# Patient Record
Sex: Male | Born: 1990 | Race: White | Hispanic: No | Marital: Single | Smoking: Current every day smoker
Health system: Southern US, Community
[De-identification: ages and names within clinical notes are randomized; demographics above are authoritative.]

## PROBLEM LIST (undated history)

## (undated) DIAGNOSIS — F17200 Nicotine dependence, unspecified, uncomplicated: Secondary | ICD-10-CM

## (undated) DIAGNOSIS — K759 Inflammatory liver disease, unspecified: Secondary | ICD-10-CM

## (undated) DIAGNOSIS — F419 Anxiety disorder, unspecified: Secondary | ICD-10-CM

## (undated) DIAGNOSIS — J45909 Unspecified asthma, uncomplicated: Secondary | ICD-10-CM

## (undated) DIAGNOSIS — F1920 Other psychoactive substance dependence, uncomplicated: Secondary | ICD-10-CM

---

## 2020-10-07 ENCOUNTER — Inpatient Hospital Stay (HOSPITAL_COMMUNITY)
Admission: AD | Admit: 2020-10-07 | Discharge: 2020-11-27 | DRG: 163 | Disposition: A | Discharge status: Home / Self Care | Payer: Self-pay | Source: Other Acute Inpatient Hospital | Attending: Internal Medicine | Admitting: Internal Medicine

## 2020-10-07 ENCOUNTER — Encounter (HOSPITAL_COMMUNITY): Payer: Self-pay | Admitting: Internal Medicine

## 2020-10-07 ENCOUNTER — Inpatient Hospital Stay
Admission: AD | Admit: 2020-10-07 | Payer: Self-pay | Source: Other Acute Inpatient Hospital | Admitting: Internal Medicine

## 2020-10-07 DIAGNOSIS — B370 Candidal stomatitis: Secondary | ICD-10-CM | POA: Diagnosis present

## 2020-10-07 DIAGNOSIS — F17210 Nicotine dependence, cigarettes, uncomplicated: Secondary | ICD-10-CM | POA: Diagnosis present

## 2020-10-07 DIAGNOSIS — M4624 Osteomyelitis of vertebra, thoracic region: Secondary | ICD-10-CM | POA: Diagnosis present

## 2020-10-07 DIAGNOSIS — J869 Pyothorax without fistula: Secondary | ICD-10-CM

## 2020-10-07 DIAGNOSIS — Z20822 Contact with and (suspected) exposure to covid-19: Secondary | ICD-10-CM | POA: Diagnosis present

## 2020-10-07 DIAGNOSIS — M4644 Discitis, unspecified, thoracic region: Secondary | ICD-10-CM | POA: Diagnosis not present

## 2020-10-07 DIAGNOSIS — J9601 Acute respiratory failure with hypoxia: Secondary | ICD-10-CM | POA: Diagnosis present

## 2020-10-07 DIAGNOSIS — Z09 Encounter for follow-up examination after completed treatment for conditions other than malignant neoplasm: Secondary | ICD-10-CM

## 2020-10-07 DIAGNOSIS — Z419 Encounter for procedure for purposes other than remedying health state, unspecified: Secondary | ICD-10-CM

## 2020-10-07 DIAGNOSIS — G061 Intraspinal abscess and granuloma: Secondary | ICD-10-CM

## 2020-10-07 DIAGNOSIS — F17200 Nicotine dependence, unspecified, uncomplicated: Secondary | ICD-10-CM | POA: Diagnosis present

## 2020-10-07 DIAGNOSIS — I10 Essential (primary) hypertension: Secondary | ICD-10-CM

## 2020-10-07 DIAGNOSIS — J9602 Acute respiratory failure with hypercapnia: Secondary | ICD-10-CM | POA: Diagnosis present

## 2020-10-07 DIAGNOSIS — F1120 Opioid dependence, uncomplicated: Secondary | ICD-10-CM | POA: Diagnosis present

## 2020-10-07 DIAGNOSIS — M4804 Spinal stenosis, thoracic region: Secondary | ICD-10-CM | POA: Diagnosis present

## 2020-10-07 DIAGNOSIS — Z79899 Other long term (current) drug therapy: Secondary | ICD-10-CM

## 2020-10-07 DIAGNOSIS — F1920 Other psychoactive substance dependence, uncomplicated: Secondary | ICD-10-CM | POA: Diagnosis present

## 2020-10-07 DIAGNOSIS — R0902 Hypoxemia: Secondary | ICD-10-CM

## 2020-10-07 DIAGNOSIS — E861 Hypovolemia: Secondary | ICD-10-CM | POA: Diagnosis present

## 2020-10-07 DIAGNOSIS — D702 Other drug-induced agranulocytosis: Secondary | ICD-10-CM | POA: Diagnosis not present

## 2020-10-07 DIAGNOSIS — Z6841 Body Mass Index (BMI) 40.0 and over, adult: Secondary | ICD-10-CM

## 2020-10-07 DIAGNOSIS — E871 Hypo-osmolality and hyponatremia: Secondary | ICD-10-CM | POA: Diagnosis present

## 2020-10-07 DIAGNOSIS — D509 Iron deficiency anemia, unspecified: Secondary | ICD-10-CM

## 2020-10-07 DIAGNOSIS — B1920 Unspecified viral hepatitis C without hepatic coma: Secondary | ICD-10-CM

## 2020-10-07 DIAGNOSIS — M62838 Other muscle spasm: Secondary | ICD-10-CM | POA: Diagnosis present

## 2020-10-07 DIAGNOSIS — J45909 Unspecified asthma, uncomplicated: Secondary | ICD-10-CM | POA: Diagnosis present

## 2020-10-07 DIAGNOSIS — E872 Acidosis: Secondary | ICD-10-CM | POA: Diagnosis present

## 2020-10-07 DIAGNOSIS — G4733 Obstructive sleep apnea (adult) (pediatric): Secondary | ICD-10-CM

## 2020-10-07 DIAGNOSIS — Z4659 Encounter for fitting and adjustment of other gastrointestinal appliance and device: Secondary | ICD-10-CM

## 2020-10-07 DIAGNOSIS — E662 Morbid (severe) obesity with alveolar hypoventilation: Secondary | ICD-10-CM | POA: Diagnosis present

## 2020-10-07 DIAGNOSIS — A4902 Methicillin resistant Staphylococcus aureus infection, unspecified site: Secondary | ICD-10-CM

## 2020-10-07 DIAGNOSIS — A419 Sepsis, unspecified organism: Secondary | ICD-10-CM | POA: Diagnosis not present

## 2020-10-07 DIAGNOSIS — B9562 Methicillin resistant Staphylococcus aureus infection as the cause of diseases classified elsewhere: Secondary | ICD-10-CM | POA: Diagnosis present

## 2020-10-07 DIAGNOSIS — F411 Generalized anxiety disorder: Secondary | ICD-10-CM | POA: Diagnosis present

## 2020-10-07 DIAGNOSIS — T39395A Adverse effect of other nonsteroidal anti-inflammatory drugs [NSAID], initial encounter: Secondary | ICD-10-CM | POA: Diagnosis not present

## 2020-10-07 HISTORY — DX: Anxiety disorder, unspecified: F41.9

## 2020-10-07 HISTORY — DX: Morbid (severe) obesity due to excess calories: E66.01

## 2020-10-07 HISTORY — DX: Other psychoactive substance dependence, uncomplicated: F19.20

## 2020-10-07 HISTORY — DX: Unspecified asthma, uncomplicated: J45.909

## 2020-10-07 HISTORY — DX: Nicotine dependence, unspecified, uncomplicated: F17.200

## 2020-10-07 LAB — CBC WITH DIFFERENTIAL/PLATELET
Abs Immature Granulocytes: 0.04 10*3/uL (ref 0.00–0.07)
Basophils Absolute: 0 10*3/uL (ref 0.0–0.1)
Basophils Relative: 0 %
Eosinophils Absolute: 0.1 10*3/uL (ref 0.0–0.5)
Eosinophils Relative: 2 %
HCT: 33.6 % — ABNORMAL LOW (ref 39.0–52.0)
Hemoglobin: 11 g/dL — ABNORMAL LOW (ref 13.0–17.0)
Immature Granulocytes: 1 %
Lymphocytes Relative: 18 %
Lymphs Abs: 1.3 10*3/uL (ref 0.7–4.0)
MCH: 30.1 pg (ref 26.0–34.0)
MCHC: 32.7 g/dL (ref 30.0–36.0)
MCV: 91.8 fL (ref 80.0–100.0)
Monocytes Absolute: 0.3 10*3/uL (ref 0.1–1.0)
Monocytes Relative: 3 %
Neutro Abs: 5.6 10*3/uL (ref 1.7–7.7)
Neutrophils Relative %: 76 %
Platelets: 358 10*3/uL (ref 150–400)
RBC: 3.66 MIL/uL — ABNORMAL LOW (ref 4.22–5.81)
RDW: 13.4 % (ref 11.5–15.5)
WBC: 7.3 10*3/uL (ref 4.0–10.5)
nRBC: 0 % (ref 0.0–0.2)

## 2020-10-07 LAB — HIV ANTIBODY (ROUTINE TESTING W REFLEX): HIV Screen 4th Generation wRfx: NONREACTIVE

## 2020-10-07 LAB — COMPREHENSIVE METABOLIC PANEL
ALT: 17 U/L (ref 0–44)
AST: 29 U/L (ref 15–41)
Albumin: 2.4 g/dL — ABNORMAL LOW (ref 3.5–5.0)
Alkaline Phosphatase: 128 U/L — ABNORMAL HIGH (ref 38–126)
Anion gap: 10 (ref 5–15)
BUN: 10 mg/dL (ref 6–20)
CO2: 25 mmol/L (ref 22–32)
Calcium: 9.1 mg/dL (ref 8.9–10.3)
Chloride: 99 mmol/L (ref 98–111)
Creatinine, Ser: 0.76 mg/dL (ref 0.61–1.24)
GFR, Estimated: >60 mL/min (ref >60)
Glucose, Bld: 103 mg/dL — ABNORMAL HIGH (ref 70–99)
Potassium: 4.2 mmol/L (ref 3.5–5.1)
Sodium: 134 mmol/L — ABNORMAL LOW (ref 135–145)
Total Bilirubin: 0.6 mg/dL (ref 0.3–1.2)
Total Protein: 8 g/dL (ref 6.5–8.1)

## 2020-10-07 LAB — C-REACTIVE PROTEIN: CRP: 16.4 mg/dL — ABNORMAL HIGH (ref <1.0)

## 2020-10-07 LAB — PROCALCITONIN: Procalcitonin: 0.47 ng/mL

## 2020-10-07 LAB — LACTIC ACID, PLASMA
Lactic Acid, Venous: 1.1 mmol/L (ref 0.5–1.9)
Lactic Acid, Venous: 1.7 mmol/L (ref 0.5–1.9)

## 2020-10-07 LAB — SEDIMENTATION RATE: Sed Rate: 112 mm/hr — ABNORMAL HIGH (ref 0–16)

## 2020-10-07 MED ORDER — ONDANSETRON HCL 4 MG PO TABS: 4.0000 mg | ORAL_TABLET | Freq: Four times a day (QID) | ORAL | Status: DC | PRN

## 2020-10-07 MED ORDER — ONDANSETRON HCL 4 MG/2ML IJ SOLN: 4.0000 mg | Freq: Four times a day (QID) | INTRAMUSCULAR | Status: DC | PRN

## 2020-10-07 MED ORDER — VANCOMYCIN HCL 10 G IV SOLR
2500.0000 mg | Freq: Once | INTRAVENOUS | Status: DC
  Filled 2020-10-07: qty 2500

## 2020-10-07 MED ORDER — CLONIDINE HCL 0.1 MG PO TABS
0.1000 mg | ORAL_TABLET | Freq: Four times a day (QID) | ORAL | Status: AC
  Administered 2020-10-07 – 2020-10-08 (×7): 0.1 mg via ORAL
  Filled 2020-10-07 (×7): qty 1

## 2020-10-07 MED ORDER — SODIUM CHLORIDE 0.9 % IV SOLN
2.0000 g | Freq: Once | INTRAVENOUS | Status: AC
  Administered 2020-10-07: 2 g via INTRAVENOUS
  Filled 2020-10-07: qty 2

## 2020-10-07 MED ORDER — CLONIDINE HCL 0.1 MG PO TABS: 0.1000 mg | ORAL_TABLET | Freq: Every day | ORAL | Status: DC

## 2020-10-07 MED ORDER — BUPRENORPHINE HCL-NALOXONE HCL 2-0.5 MG SL SUBL
1.0000 | SUBLINGUAL_TABLET | SUBLINGUAL | Status: AC | PRN
  Administered 2020-10-07 (×2): 1 via SUBLINGUAL
  Filled 2020-10-07 (×3): qty 1

## 2020-10-07 MED ORDER — DICYCLOMINE HCL 20 MG PO TABS
20.0000 mg | ORAL_TABLET | Freq: Four times a day (QID) | ORAL | Status: DC | PRN
  Filled 2020-10-07: qty 1

## 2020-10-07 MED ORDER — LACTATED RINGERS IV SOLN: INTRAVENOUS | Status: DC

## 2020-10-07 MED ORDER — IPRATROPIUM-ALBUTEROL 0.5-2.5 (3) MG/3ML IN SOLN
3.0000 mL | Freq: Four times a day (QID) | RESPIRATORY_TRACT | Status: DC
  Administered 2020-10-07: 3 mL via RESPIRATORY_TRACT
  Filled 2020-10-07: qty 3

## 2020-10-07 MED ORDER — ACETAMINOPHEN 325 MG PO TABS
650.0000 mg | ORAL_TABLET | Freq: Four times a day (QID) | ORAL | Status: DC | PRN
  Administered 2020-10-08 (×4): 650 mg via ORAL
  Filled 2020-10-07 (×4): qty 2

## 2020-10-07 MED ORDER — BUPRENORPHINE HCL-NALOXONE HCL 8-2 MG SL SUBL
1.0000 | SUBLINGUAL_TABLET | Freq: Two times a day (BID) | SUBLINGUAL | Status: DC
  Administered 2020-10-08 – 2020-10-09 (×3): 1 via SUBLINGUAL
  Filled 2020-10-07 (×3): qty 1

## 2020-10-07 MED ORDER — NICOTINE 14 MG/24HR TD PT24
14.0000 mg | MEDICATED_PATCH | Freq: Every day | TRANSDERMAL | Status: DC
  Administered 2020-10-07 – 2020-11-27 (×51): 14 mg via TRANSDERMAL
  Filled 2020-10-07 (×53): qty 1

## 2020-10-07 MED ORDER — NAPROXEN 250 MG PO TABS
500.0000 mg | ORAL_TABLET | Freq: Two times a day (BID) | ORAL | Status: DC | PRN
  Administered 2020-10-07 – 2020-10-08 (×3): 500 mg via ORAL
  Filled 2020-10-07 (×6): qty 2

## 2020-10-07 MED ORDER — ACETAMINOPHEN 650 MG RE SUPP: 650.0000 mg | Freq: Four times a day (QID) | RECTAL | Status: DC | PRN

## 2020-10-07 MED ORDER — METHOCARBAMOL 500 MG PO TABS
500.0000 mg | ORAL_TABLET | Freq: Three times a day (TID) | ORAL | Status: DC | PRN
  Administered 2020-10-07 – 2020-10-08 (×4): 500 mg via ORAL
  Filled 2020-10-07 (×5): qty 1

## 2020-10-07 MED ORDER — HYDROXYZINE HCL 25 MG PO TABS
25.0000 mg | ORAL_TABLET | Freq: Four times a day (QID) | ORAL | Status: DC | PRN
  Administered 2020-10-08: 25 mg via ORAL
  Filled 2020-10-07: qty 1

## 2020-10-07 MED ORDER — SODIUM CHLORIDE 0.9 % IV SOLN
2.0000 g | Freq: Three times a day (TID) | INTRAVENOUS | Status: DC
  Administered 2020-10-07 – 2020-10-11 (×11): 2 g via INTRAVENOUS
  Filled 2020-10-07 (×11): qty 2

## 2020-10-07 MED ORDER — LOPERAMIDE HCL 2 MG PO CAPS: 2.0000 mg | ORAL_CAPSULE | ORAL | Status: DC | PRN

## 2020-10-07 MED ORDER — ALBUTEROL SULFATE (2.5 MG/3ML) 0.083% IN NEBU
2.5000 mg | INHALATION_SOLUTION | RESPIRATORY_TRACT | Status: DC | PRN
  Administered 2020-10-08 – 2020-11-11 (×2): 2.5 mg via RESPIRATORY_TRACT
  Filled 2020-10-07 (×2): qty 3

## 2020-10-07 MED ORDER — SODIUM CHLORIDE 0.9% FLUSH
3.0000 mL | Freq: Two times a day (BID) | INTRAVENOUS | Status: DC
  Administered 2020-10-07 – 2020-11-10 (×51): 3 mL via INTRAVENOUS

## 2020-10-07 MED ORDER — CLONIDINE HCL 0.1 MG PO TABS
0.1000 mg | ORAL_TABLET | ORAL | Status: DC
  Administered 2020-10-09 (×2): 0.1 mg via ORAL
  Filled 2020-10-07 (×3): qty 1

## 2020-10-07 MED ORDER — VANCOMYCIN HCL 2000 MG/400ML IV SOLN
2000.0000 mg | Freq: Two times a day (BID) | INTRAVENOUS | Status: DC
  Filled 2020-10-07: qty 400

## 2020-10-07 NOTE — Progress Notes (Signed)
Patient admitted to 4E. VS are stable. CHG bath given. [name] A/O x4. Admitting MD notified of pt arrival.

## 2020-10-07 NOTE — Progress Notes (Signed)
Pharmacy Antibiotic Note  Derek Woods is a 30 y.o. male admitted on 10/07/2020 with sepsis.  Pharmacy has been consulted for vancomycin and cefepime dosing.  Patient normotensive, with HR in the 80s, pharmacy consulted for sepsis. WBC 9.3, Lactate 2.2, HS CRP 143.8, Procalcitonin 0.32, COVID negative. Patient noted with unknown childhood reaction to augmentin and ceclor, will monitor.  CrCl >120 with Scr of 0.76. Vancomycin calculated AUC is 496.  Plan: Vancomycin loading dose 2500mg  IV x1 Vancomycin 2000mg  IV every 12 hours.  Goal AUC 400-550 Cefepime 2g q8 hours Monitor cultures for deescalating and renal function  Height: 5\' 6"  (167.6 cm) Weight: (!) 146.7 kg (323 lb 6.4 oz) IBW/kg (Calculated) : 63.8  Temp (24hrs), Avg:98.8 F (37.1 C), Min:98.8 F (37.1 C), Max:98.8 F (37.1 C)  No results for input(s): WBC, CREATININE, LATICACIDVEN, VANCOTROUGH, VANCOPEAK, VANCORANDOM, GENTTROUGH, GENTPEAK, GENTRANDOM, TOBRATROUGH, TOBRAPEAK, TOBRARND, AMIKACINPEAK, AMIKACINTROU, AMIKACIN in the last 168 hours.  CrCl cannot be calculated (No successful lab value found.).    No Known Allergies  Antimicrobials this admission: 4/24 vancomycin >> 4/24 cefepime >>  Dose adjustments this admission: N/A  Microbiology results: 4/24 BCx: pending (collected prior to abx)  Thank you for allowing pharmacy to be a part of this patient's care.  5/24, PharmD PGY1 Pharmacy Resident 10/07/2020 11:00 AM

## 2020-10-07 NOTE — Consult Note (Signed)
[address]       [place] [id number]             [phone]      Cardiothoracic Surgery Consultation  Reason for Consult: Right Empyema Referring Physician: Dr. Jonah BlueJennifer Yates  Ileene Rubensracey Woods is an 30 y.o. male.  HPI:   The patient is a 30 year old gentleman with a history of morbid obesity, daily heroin abuse, and smoking who presents with about a 3-week history of progressive right back pain with development of worsening shortness of breath over the past week.  He said that he initially thought he had pulled a muscle and it would get better but it has not.  He denies any fever or chills.  He has had some cough but no sputum production.  He says that he tries use heroin every day if he can.  He smokes 1 pack of cigarettes per day but has been smoking less since he got short of breath.  He is unemployed.  Past Medical History:  Diagnosis Date  . Anxiety   . Asthma   . Morbid obesity with BMI of 50.0-59.9, adult (HCC)   . Polysubstance dependence including opioid type drug with complication, episodic abuse (HCC)     History reviewed. No pertinent surgical history.  History reviewed. No pertinent family history.  Social History:  reports that he has been smoking cigarettes. He has a 11.00 pack-year smoking history. He uses smokeless tobacco. He reports current alcohol use. He reports current drug use. Drugs: Heroin and Marijuana.  Allergies:  Allergies  Allergen Reactions  . Augmentin [Amoxicillin-Pot Clavulanate] Other (See Comments)    Pt does not recall, childhood  . Ceclor [Cefaclor] Other (See Comments)    Pt does not recall, childhood  . Sulfa Antibiotics Hives    Medications:  I have reviewed the patient's current medications. Prior to Admission:  Medications Prior to Admission  Medication Sig Dispense Refill Last Dose  . acetaminophen (TYLENOL) 500 MG tablet Take 500 mg by mouth as needed (pain).   10/06/2020 at Unknown time  . ibuprofen  (ADVIL) 200 MG tablet Take 600-800 mg by mouth as needed (pain).   10/06/2020 at Unknown time  . Melatonin 10 MG TABS Take 10 mg by mouth at bedtime as needed (sleep).   unknown   Scheduled: . [START ON 10/08/2020] buprenorphine-naloxone  1 tablet Sublingual BID  . cloNIDine  0.1 mg Oral QID   Followed by  . [START ON 10/09/2020] cloNIDine  0.1 mg Oral BH-qamhs   Followed by  . [START ON 10/11/2020] cloNIDine  0.1 mg Oral QAC breakfast  . ipratropium-albuterol  3 mL Nebulization Q6H  . nicotine  14 mg Transdermal Daily  . sodium chloride flush  3 mL Intravenous Q12H   Continuous: . ceFEPime (MAXIPIME) IV 2 g (10/07/20 1321)  . ceFEPime (MAXIPIME) IV    . lactated ringers 100 mL/hr at 10/07/20 1310  . vancomycin    . vancomycin     BJY:[id number]:acetaminophen **OR** acetaminophen, albuterol, buprenorphine-naloxone, dicyclomine, hydrOXYzine, loperamide, methocarbamol, naproxen, ondansetron **OR** ondansetron (ZOFRAN) IV Anti-infectives (From admission, onward)   Start     Dose/Rate Route Frequency Ordered Stop   10/07/20 2200  vancomycin (VANCOREADY) IVPB 2000 mg/400 mL        2,000 mg 200 mL/hr over 120 Minutes Intravenous Every 12 hours 10/07/20 1251     10/07/20 2000  ceFEPIme (MAXIPIME) 2 g in sodium chloride 0.9 % 100 mL IVPB  2 g 200 mL/hr over 30 Minutes Intravenous Every 8 hours 10/07/20 1251     10/07/20 1145  ceFEPIme (MAXIPIME) 2 g in sodium chloride 0.9 % 100 mL IVPB        2 g 200 mL/hr over 30 Minutes Intravenous  Once 10/07/20 1048     10/07/20 1100  vancomycin (VANCOCIN) 2,500 mg in sodium chloride 0.9 % 500 mL IVPB        2,500 mg 250 mL/hr over 120 Minutes Intravenous  Once 10/07/20 1048        Results for orders placed or performed during the hospital encounter of 10/07/20 (from the past 48 hour(s))  HIV Antibody (routine testing w rflx)     Status: None   Collection Time: 10/07/20 11:01 AM  Result Value Ref Range   HIV Screen 4th Generation wRfx Non Reactive Non  Reactive    Comment: Performed at [facility], [address], [place], [place] [address]  Lactic acid, plasma     Status: None   Collection Time: 10/07/20 11:01 AM  Result Value Ref Range   Lactic Acid, Venous 1.1 0.5 - 1.9 mmol/L    Comment: Performed at [facility], [address], [place], [place] [address]  CBC WITH DIFFERENTIAL     Status: Abnormal   Collection Time: 10/07/20 11:01 AM  Result Value Ref Range   WBC 7.3 4.0 - 10.5 K/uL   RBC 3.66 (L) 4.22 - 5.81 MIL/uL   Hemoglobin 11.0 (L) 13.0 - 17.0 g/dL   HCT 69.6 (L) 29.5 - 28.4 %   MCV 91.8 80.0 - 100.0 fL   MCH 30.1 26.0 - 34.0 pg   MCHC 32.7 30.0 - 36.0 g/dL   RDW 13.2 44.0 - 10.2 %   Platelets 358 150 - 400 K/uL   nRBC 0.0 0.0 - 0.2 %   Neutrophils Relative % 76 %   Neutro Abs 5.6 1.7 - 7.7 K/uL   Lymphocytes Relative 18 %   Lymphs Abs 1.3 0.7 - 4.0 K/uL   Monocytes Relative 3 %   Monocytes Absolute 0.3 0.1 - 1.0 K/uL   Eosinophils Relative 2 %   Eosinophils Absolute 0.1 0.0 - 0.5 K/uL   Basophils Relative 0 %   Basophils Absolute 0.0 0.0 - 0.1 K/uL   Immature Granulocytes 1 %   Abs Immature Granulocytes 0.04 0.00 - 0.07 K/uL    Comment: Performed at [facility], [address] [place], [place] [address]  Comprehensive metabolic panel     Status: Abnormal   Collection Time: 10/07/20 11:01 AM  Result Value Ref Range   Sodium 134 (L) 135 - 145 mmol/L   Potassium 4.2 3.5 - 5.1 mmol/L   Chloride 99 98 - 111 mmol/L   CO2 25 22 - 32 mmol/L   Glucose, Bld 103 (H) 70 - 99 mg/dL    Comment: Glucose reference range applies only to samples taken after fasting for at least 8 hours.   BUN 10 6 - 20 mg/dL   Creatinine, Ser 6.44 0.61 - 1.24 mg/dL   Calcium 9.1 8.9 - 03.4 mg/dL   Total Protein 8.0 6.5 - 8.1 g/dL   Albumin 2.4 (L) 3.5 - 5.0 g/dL   AST 29 15 - 41 U/L   ALT 17 0 - 44 U/L   Alkaline Phosphatase 128 (H) 38 - 126 U/L   Total Bilirubin 0.6 0.3 - 1.2 mg/dL   GFR, Estimated >74 >25 mL/min  Comment: (NOTE) Calculated using the CKD-EPI Creatinine Equation (2021)    Anion gap 10 5 - 15    Comment: Performed at [facility], [address], [place], [place] [address]  Sedimentation rate     Status: Abnormal   Collection Time: 10/07/20 11:01 AM  Result Value Ref Range   Sed Rate 112 (H) 0 - 16 mm/hr    Comment: Performed at [facility], [address], [place], [place] [address]  C-reactive protein     Status: Abnormal   Collection Time: 10/07/20 11:01 AM  Result Value Ref Range   CRP 16.4 (H) <1.0 mg/dL    Comment: Performed at [facility], [address] [place], [place] [address]    No results found.  Review of Systems  Constitutional: Positive for fatigue. Negative for appetite change, chills, diaphoresis, fever and unexpected weight change.  HENT: Negative.   Eyes: Negative.   Respiratory: Positive for cough, chest tightness and shortness of breath.   Cardiovascular: Positive for chest pain. Negative for leg swelling.  Gastrointestinal: Negative.   Endocrine: Negative.   Genitourinary: Negative.   Musculoskeletal: Positive for back pain.  Skin: Negative.   Allergic/Immunologic: Negative.   Neurological: Negative.   Hematological: Negative.   Psychiatric/Behavioral: Negative.    Blood pressure (!) 131/95, pulse 92, temperature 98.4 F (36.9 C), temperature source Oral, resp. rate (!) 22, height 5\' 6"  (1.676 m), weight (!) 146.7 kg, SpO2 97 %. Physical Exam Constitutional:      General: He is not in acute distress.    Appearance: He is obese.  HENT:     Head: Normocephalic and atraumatic.  Eyes:     Extraocular Movements: Extraocular movements intact.     Pupils: Pupils are equal, round, and reactive to light.  Neck:     Vascular: No carotid bruit.  Cardiovascular:     Rate and Rhythm: Normal rate and regular rhythm.     Pulses: Normal pulses.     Heart sounds: Normal heart sounds. No murmur heard.   Pulmonary:     Effort:  Pulmonary effort is normal.     Breath sounds: No wheezing or rhonchi.     Comments: Decreased breath sounds over the right hemithorax Abdominal:     Tenderness: There is no abdominal tenderness.  Musculoskeletal:        General: No swelling.     Cervical back: Normal range of motion and neck supple.  Lymphadenopathy:     Cervical: No cervical adenopathy.  Skin:    General: Skin is warm and dry.     Comments: Multiple old scars on his trunk from prior cysts  Neurological:     General: No focal deficit present.     Mental Status: He is alert and oriented to person, place, and time.  Psychiatric:        Mood and Affect: Mood normal.        Behavior: Behavior normal.    CT scan of the chest and abdomen reviewed from [facility] on PACS  Assessment/Plan:  This 30 year old gentleman has a large right empyema with a 3-week history of progressive right-sided back pain and shortness of breath there is a thick rim around the collection.  Given his obesity and the appearance of the empyema I think this would be best drained surgically with either VATS if possible or limited thoracotomy. Percutaneous catheter drainage is likely to be incomplete and difficult due to his obesity.  I  discussed the operative procedure of right VATS and possible thoracotomy with him including alternatives, benefits, and risks including but not limited to bleeding, blood transfusion, persistent pleural space infection, prolonged air leak from the lung, incomplete expansion of the lung, and respiratory failure.  He understands and agrees to proceed.  I would not be able to get this on the schedule until Tuesday afternoon.  He currently shows no signs of sepsis or toxicity so waiting until Tuesday afternoon should be fine.  [name] 10/07/2020, 1:41 PM

## 2020-10-07 NOTE — H&P (Signed)
History and Physical    Derek Woods HKV:[id number] DOB: [birth year] DOA: 10/07/2020  PCP: No primary care provider on file. Consultants:  None Patient coming from:  Home - lives with mother; Derek Woods: Mother, [phone]  Chief Complaint: R flank and back pain  HPI: Derek Woods is a 30 y.o. male with medical history significant of asthma; anxiety; morbid obesity; and polysubstance abuse presenting with R flank pain and back pain.  He noticed onset of back pain, breathing difficulty worse than his usual asthma.  It is hard to stand and move, hard to do anything.  Symptoms have been present for a couple of days with SOB, but he has had back pain for about 3 weeks.  No fevers.  No sick contacts.  Last heroin use was yesterday AM.  He has been on Subutex for a few months and did well until he broke his foot and couldn't make it to his drug tests.  He would like to start back on Suboxone.    He reports that he has a skin condition causing diffuse pustules with scarring.  He can't remember the name of it.  He previously had PNA and pleural effusion in January; per [facility] ER record, it was aspiration PNA after heroin OD.    ED Course:  RH to [facility] transfer, per Dr. Cyd Silence:  Rght flank and back pain.  Pulse 108, BP 148/84, RR 24, saturating 93% on 2 L. CT chest revealed a substantial right-sided effusion measuring 15.2 x 7 cm with the appearance of an empyema with adjacent compressive atelectasis/infiltrate with evidence of mediastinal lymphadenopathy.  UDS + for marijuana alone (although patient admits to Heroin use within the past 24 hrs).  Lactic acid 2.2.  HS CRP 143.8.  Procalcitonin 0.32.  COVID-19 PCR negative. Started on intravenous antibiotic therapy.  Due to presence of an empyema is felt that either interventional radiology or cardiothoracic surgery was necessary.  Images can be reviewed with radiology on arrival to determine best approach.  Review of Systems: As per HPI; otherwise review of systems  reviewed and negative.   Ambulatory Status:  Ambulates without assistance  COVID Vaccine Status:  None  Past Medical History:  Diagnosis Date  . Anxiety   . Asthma   . Morbid obesity with BMI of 50.0-59.9, adult ([place])   . Polysubstance dependence including opioid type drug with complication, episodic abuse ([place])   . Tobacco dependence     History reviewed. No pertinent surgical history.  Social History   Socioeconomic History  . Marital status: Not on file    Spouse name: Not on file  . Number of children: Not on file  . Years of education: Not on file  . Highest education level: Not on file  Occupational History  . Occupation: unemployed  Tobacco Use  . Smoking status: Current Every Day Smoker    Packs/day: 1.00    Years: 11.00    Pack years: 11.00    Types: Cigarettes  . Smokeless tobacco: Current User  . Tobacco comment: unable to smoke; occasional use of oral tobacco  Substance and Sexual Activity  . Alcohol use: Yes    Comment: "hardly any"  . Drug use: Yes    Types: Heroin, Marijuana    Comment: occasional marijuana, daily heroin  . Sexual activity: Not on file  Other Topics Concern  . Not on file  Social History Narrative  . Not on file   Social Determinants of Health   Financial Resource Strain: Not on  file  Food Insecurity: Not on file  Transportation Needs: Not on file  Physical Activity: Not on file  Stress: Not on file  Social Connections: Not on file  Intimate Partner Violence: Not on file    Allergies  Allergen Reactions  . Augmentin [Amoxicillin-Pot Clavulanate] Other (See Comments)    Pt does not recall, childhood  . Ceclor [Cefaclor] Other (See Comments)    Pt does not recall, childhood  . Sulfa Antibiotics Hives    History reviewed. No pertinent family history.  Prior to Admission medications   Not on File    Physical Exam: Vitals:   10/07/20 1006 10/07/20 1143  BP: (!) 158/99 (!) 131/95  Pulse: 97 92  Resp: (!) 21 (!) 22   Temp: 98.8 F (37.1 C) 98.4 F (36.9 C)  TempSrc: Oral Oral  SpO2: 97% 97%  Weight: (!) 146.7 kg   Height: 5' 6" (1.676 m)      . General:  Appears obese, mildly disheveled, and SOB despite [place] O2 . Eyes:   EOMI, normal lids, iris . ENT:  grossly normal hearing, lips & tongue, mmm . Neck:  no LAD, masses or thyromegaly . Cardiovascular:  RRR, no m/r/g. No LE edema.  Marland Kitchen Respiratory:   Decreased air movement on the right with scattered rhonchi.  Mildly increased respiratory effort. . Abdomen:  soft, NT, ND . Skin:  no rash or induration seen on limited exam; scattered skin lesions on hands and distal forearms c/w injection wounds . Musculoskeletal:  grossly normal tone BUE/BLE, good ROM, no bony abnormality . Psychiatric:  blunted mood and affect, speech fluent and appropriate, AOx3 . Neurologic:  CN 2-12 grossly intact, moves all extremities in coordinated fashion    Radiological Exams on Admission: Independently reviewed - see discussion in A/P where applicable  No results found.   CT -  1. Large loculated fluid collection in R hemithorax 15.2/7 cm with a thick peripheral rim of enhancement - concerning for empyema vs. Intrapulmonary abscess 2. Stable 2 cm soft tissue lesion in anterior mediastinum - thymoma or lymph node? F/u in 3 months. 3. Prominent mediastinal, R hilar and axillary lymph nodes - likely reactive 4. Probable inflamed sebaceous cyst 0.3 cm in midline back 5. Hepatosplenomegaly 6. Sigmoid colonic diverticulosis   EKG: none   Labs on Admission: I have personally reviewed the available labs and imaging studies at the time of the admission.  Pertinent labs:   At [facility]: WBC 9.3 Hgb 11.1 Platelets 451 Glucose 139 UDS + THC Lactate 2.2 CRP 143.8 Procalcitonin 0.32 COVID negative  At [facility]: glucose 103 Albumin 2.4 CRP 16.4 ESR 112 Lactate 1.1 Procalcitonin 0.47 WBC 7.3 Hgb 11.0 HIV negative   Assessment/Plan Principal Problem:   Empyema lung  (HCC) Active Problems:   Polysubstance dependence including opioid type drug with complication, episodic abuse (HCC)   Morbid obesity with BMI of 50.0-59.9, adult (HCC)   Tobacco dependence    Empyema -Patient with h/o IVDA and ?aspiration PNA in January with resultant pleural effusion presenting with SOB, back pain -Imaging at [facility] shows significant apparent empyema -He was transferred to [facility] for IR vs. CVTS evaluation -I discussed the patient with Dr. Vernard Gambles from IR; while willing to drain, he recommended discussion with CVTS first -Blood cultures are pending -CVTS consulted, Dr. Cyndia Bent will perform VATS vs. Limited thoracotomy on Tuesday -?endocarditis - unusual to have empyema without other infection; will order echo, may need TEE -Nu current evidence of sepsis, although he appeared to  have had sepsis physiology at [facility] and so sepsis order set was used -Will cover with Cefepime empirically for now -Will admit to telemetry -Will add Duonebs and prn Albuterol  Polysubstance abuse -Long-standing heroin dependence -He reports a desire to quit and recognizes that ongoing use is life-threatening -Will initiate suboxone therapy now; while this may precipitate early withdrawal, hopefully he can reach a level of withdrawal symptom relief that will enable him to remain hospitalized and complete treatment -Will use the Suboxone therapy order set -If patient experiences precipitated withdrawal, will give small, frequent doses of buprenorphine (2 mg q 1-2h with adjunctive Clonidine 0.1 mg q8h, Zofran, and NSAIDs) until the precipitated withdrawal is overcome -If patient requires pain management in addition to home Suboxone therapy, use high-affinity full agonist opioids such as hydromorphone IV - not ordered at this time -Will monitor on COWS protocol (5-12 mild symptoms; 13-24 moderate symptoms; 25-36 moderately severe symptoms; >36 severe symptoms) -TOC team consult for substance abuse  education/support -prn orders from the Clonidine withdrawal order set were also ordered.  Tobacco dependence -Encourage cessation.   -This was discussed with the patient and should be reviewed on an ongoing basis.   -Patch ordered at patient request.  Obesity -Body mass index is 52.2 kg/m..  -Weight loss should be encouraged -Outpatient PCP/bariatric medicine/bariatric surgery f/u encouraged     Note: This patient has been tested and is negative for the novel coronavirus COVID-19. The patient has been fully vaccinated against COVID-19.   Level of care: Telemetry Medical DVT prophylaxis: SCDs Code Status:  Full - confirmed with patient Family Communication: None present Disposition Plan:  The patient is from: home  Anticipated d/c is to: home without [facility] services  Anticipated d/c date will depend on clinical response to treatment, but possibly as early as tomorrow if she has excellent response to treatment  Patient is currently: acutely ill Consults called: CVTS; IR by telephone only; Spiritual care; TOC team Admission status:  Admit - It is my clinical opinion that admission to INPATIENT is reasonable and necessary because of the expectation that this patient will require hospital care that crosses at least 2 midnights to treat this condition based on the medical complexity of the problems presented.  Given the aforementioned information, the predictability of an adverse outcome is felt to be significant.    Karmen Bongo MD [facility]   How to contact the [facility] Attending or Consulting provider [place] or covering provider during after hours [place], for this patient?  1. Check the care team in [facility] and look for a) attending/consulting TR[facility] provider listed and b) the [facility] team listed 2. Log into [email or web address] and use [place]'s universal password to access. If you do not have the password, please contact the hospital operator. 3. Locate the [facility] provider you are looking  for under [facility] and page to a number that you can be directly reached. 4. If you still have difficulty reaching the provider, please page the [facility] (Director on Call) for the Hospitalists listed on amion for assistance.   10/07/2020, 2:32 PM

## 2020-10-07 NOTE — Plan of Care (Signed)

## 2020-10-08 ENCOUNTER — Inpatient Hospital Stay (HOSPITAL_COMMUNITY): Payer: Self-pay

## 2020-10-08 DIAGNOSIS — I38 Endocarditis, valve unspecified: Secondary | ICD-10-CM

## 2020-10-08 LAB — RPR: RPR Ser Ql: NONREACTIVE

## 2020-10-08 LAB — CBC
HCT: 34.2 % — ABNORMAL LOW (ref 39.0–52.0)
Hemoglobin: 10.9 g/dL — ABNORMAL LOW (ref 13.0–17.0)
MCH: 29.7 pg (ref 26.0–34.0)
MCHC: 31.9 g/dL (ref 30.0–36.0)
MCV: 93.2 fL (ref 80.0–100.0)
Platelets: 349 10*3/uL (ref 150–400)
RBC: 3.67 MIL/uL — ABNORMAL LOW (ref 4.22–5.81)
RDW: 13.6 % (ref 11.5–15.5)
WBC: 7.5 10*3/uL (ref 4.0–10.5)
nRBC: 0 % (ref 0.0–0.2)

## 2020-10-08 LAB — BASIC METABOLIC PANEL
Anion gap: 8 (ref 5–15)
BUN: 11 mg/dL (ref 6–20)
CO2: 27 mmol/L (ref 22–32)
Calcium: 8.9 mg/dL (ref 8.9–10.3)
Chloride: 98 mmol/L (ref 98–111)
Creatinine, Ser: 0.78 mg/dL (ref 0.61–1.24)
GFR, Estimated: >60 mL/min (ref >60)
Glucose, Bld: 112 mg/dL — ABNORMAL HIGH (ref 70–99)
Potassium: 4.6 mmol/L (ref 3.5–5.1)
Sodium: 133 mmol/L — ABNORMAL LOW (ref 135–145)

## 2020-10-08 LAB — ECHOCARDIOGRAM COMPLETE
Area-P 1/2: 4.15 cm2
Calc EF: 61 %
Height: 66 in
S' Lateral: 3.3 cm
Single Plane A2C EF: 69.3 %
Single Plane A4C EF: 55.3 %
Weight: 5174.4 oz

## 2020-10-08 LAB — TYPE AND SCREEN
ABO/RH(D): O POS
Antibody Screen: NEGATIVE

## 2020-10-08 LAB — ABO/RH: ABO/RH(D): O POS

## 2020-10-08 IMAGING — DX DG CHEST 1V PORT
1 series · 1 of 1 positions shown · non-contrast
Comparison: CT scan [DATE]

CLINICAL DATA: Short of breath, cough, right-sided empyema

EXAM:
PORTABLE CHEST 1 VIEW

[chest ap]
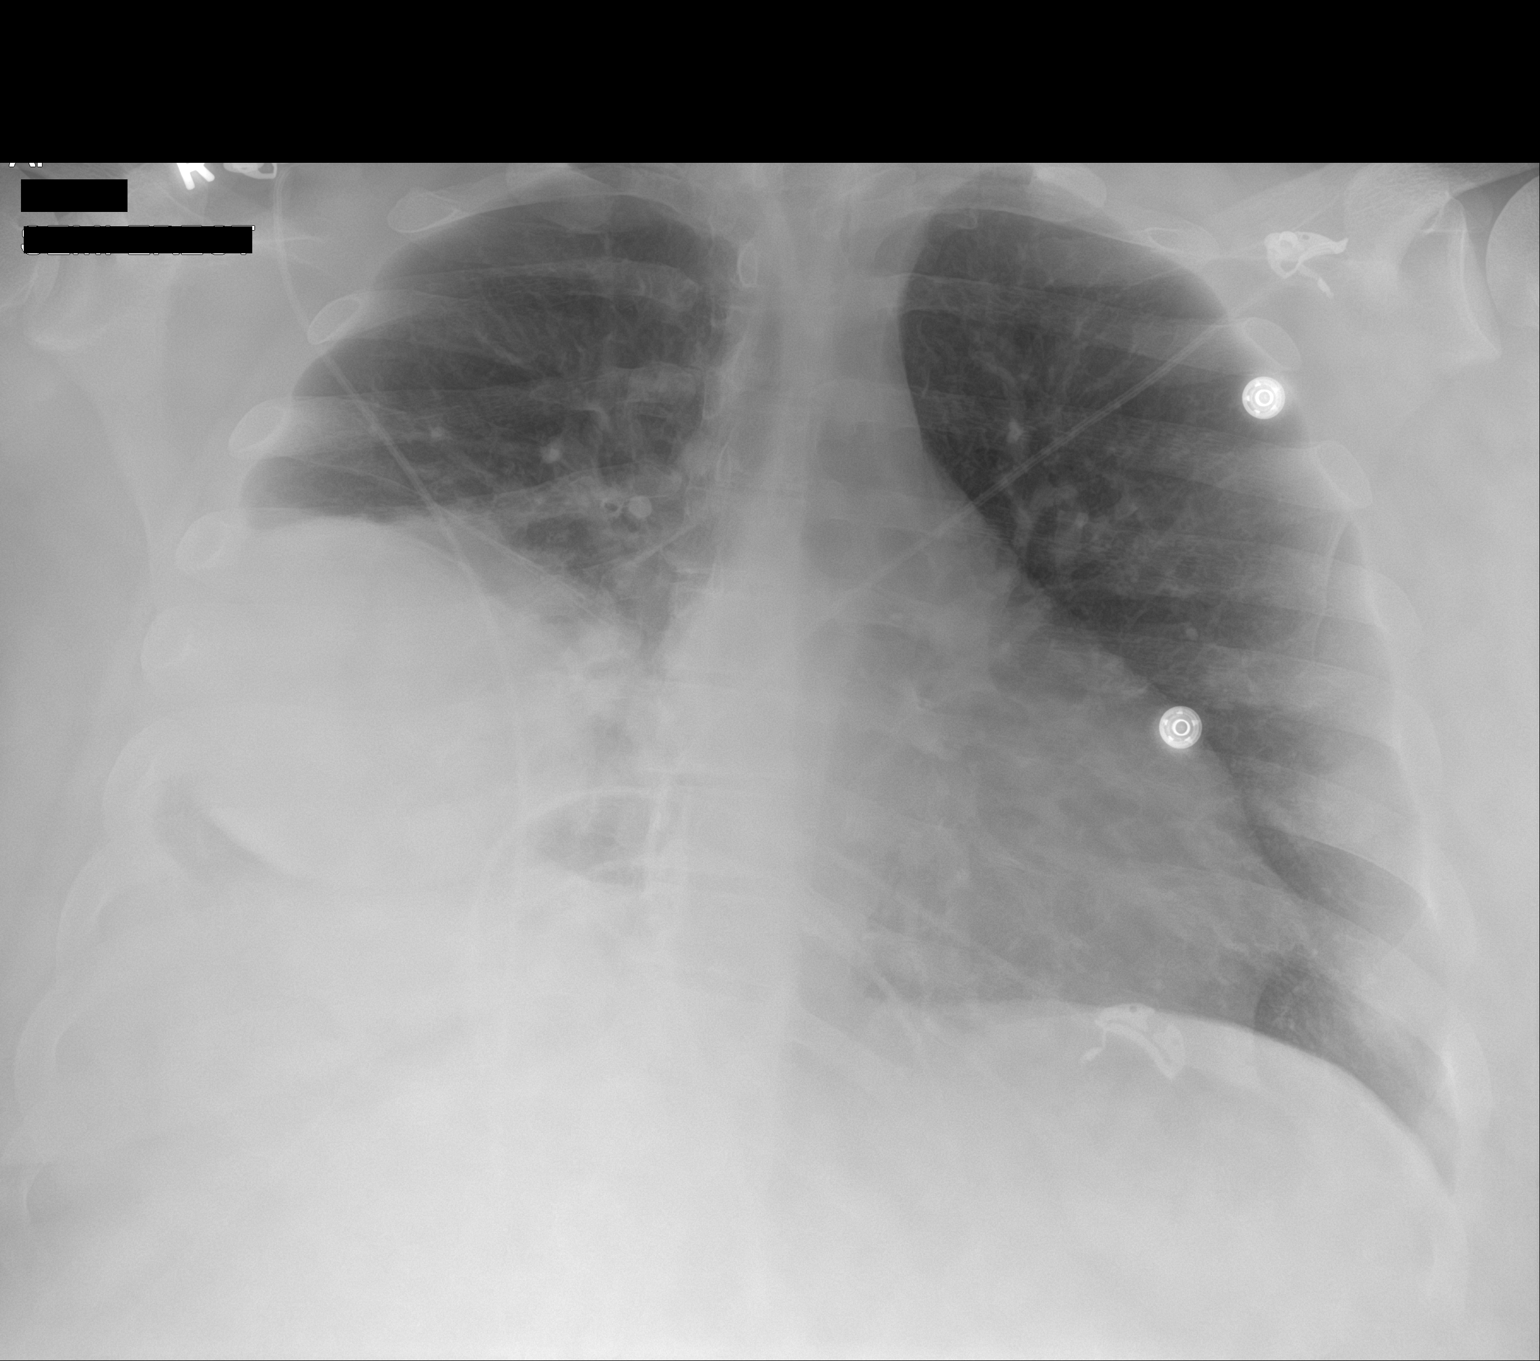

[1 of 1 positions shown; findings below may reference images not displayed]

FINDINGS: Rounded airspace opacity occupying the right mid and lower lung
consistent with known loculated pleural effusion/empyema as seen on
recent CT imaging. There is associated atelectasis in the adjacent
right middle and right lower lobes. Cardiac and mediastinal contours
are normal. The lungs are otherwise clear. No pneumothorax. No acute
osseous abnormality.
IMPRESSION: Loculated pleural effusion/empyema present in the right mid and
lower lung.

Associated mild atelectasis in the adjacent displaced right middle
and lower lobes.

## 2020-10-08 MED ORDER — ENOXAPARIN SODIUM 80 MG/0.8ML ~~LOC~~ SOLN
70.0000 mg | Freq: Once | SUBCUTANEOUS | Status: AC
  Administered 2020-10-08: 70 mg via SUBCUTANEOUS
  Filled 2020-10-08: qty 0.8

## 2020-10-08 MED ORDER — SODIUM CHLORIDE 0.9% IV SOLUTION
Freq: Once | INTRAVENOUS | Status: AC
  Administered 2020-10-08: 10 mL/h via INTRAVENOUS

## 2020-10-08 MED ORDER — IPRATROPIUM-ALBUTEROL 0.5-2.5 (3) MG/3ML IN SOLN: 3.0000 mL | Freq: Two times a day (BID) | RESPIRATORY_TRACT | Status: DC

## 2020-10-08 MED ORDER — PERFLUTREN LIPID MICROSPHERE
1.0000 mL | INTRAVENOUS | Status: AC | PRN
  Administered 2020-10-08: 2 mL via INTRAVENOUS
  Filled 2020-10-08: qty 10

## 2020-10-08 NOTE — H&P (View-Only) (Signed)
Procedure(s) (LRB): VIDEO ASSISTED THORACOSCOPY (VATS)/EMPYEMA (Right) possible limited THORACOTOMY (Right) Subjective: Some wheezing today. Breathing about the same with some SOB, back pain.  Objective: Vital signs in last 24 hours: Temp:  [97.8 F (36.6 C)-98.8 F (37.1 C)] 98 F (36.7 C) (04/25 1649) Pulse Rate:  [73-98] 89 (04/25 1649) Cardiac Rhythm: Normal sinus rhythm (04/25 0821) Resp:  [13-20] 20 (04/25 1649) BP: (131-155)/(78-91) 140/86 (04/25 1649) SpO2:  [95 %-99 %] 96 % (04/25 1649)  Hemodynamic parameters for last 24 hours:    Intake/Output from previous day: 04/24 0701 - 04/25 0700 In: 1704.4 [P.O.:240; I.V.:1464.4] Out: 1000 [Urine:1000] Intake/Output this shift: Total I/O In: 1069.7 [I.V.:769.7; IV Piggyback:300] Out: 1450 [Urine:1450]  General appearance: alert and cooperative Heart: regular rate and rhythm Lungs: diminished breath sounds right hemithorax  Lab Results: Recent Labs    10/07/20 1101 10/08/20 0132  WBC 7.3 7.5  HGB 11.0* 10.9*  HCT 33.6* 34.2*  PLT 358 349   BMET:  Recent Labs    10/07/20 1101 10/08/20 0132  NA 134* 133*  K 4.2 4.6  CL 99 98  CO2 25 27  GLUCOSE 103* 112*  BUN 10 11  CREATININE 0.76 0.78  CALCIUM 9.1 8.9    PT/INR: No results for input(s): LABPROT, INR in the last 72 hours. ABG No results found for: PHART, HCO3, TCO2, ACIDBASEDEF, O2SAT CBG (last 3)  No results for input(s): GLUCAP in the last 72 hours.  Assessment/Plan:  Right empyema. Plan drainage tomorrow by VATS or thoracotomy. I discussed procedure again with patient and father at bedside. All questions answered.   LOS: 1 day    Taygan Connell K Shallyn Constancio 10/08/2020  

## 2020-10-08 NOTE — Progress Notes (Signed)
Echocardiogram 2D Echocardiogram with defintiy has been performed.  Leta Jungling M 10/08/2020, 11:07 AM

## 2020-10-08 NOTE — Progress Notes (Signed)
PROGRESS NOTE  Derek Woods JSE:[id number] DOB: [birth year] DOA: 10/07/2020 PCP: Pcp, No  HPI/Recap of past 24 hours: Derek Woods is a 30 y.o. male with medical history significant of asthma; anxiety; morbid obesity; and polysubstance abuse presenting with R flank pain and back pain.  He noticed onset of back pain, breathing difficulty worse than his usual asthma.  It is hard to stand and move, hard to do anything.  Symptoms have been present for a couple of days with SOB, but he has had back pain for about 3 weeks.  No fevers.  No sick contacts.  Last heroin use was yesterday AM.  He has been on Subutex for a few months and did well until he broke his foot and couldn't make it to his drug tests.  He would like to start back on Suboxone.    He reports that he has a skin condition causing diffuse pustules with scarring.  He can't remember the name of it.  He previously had PNA and pleural effusion in January; per [facility] ER record, it was aspiration PNA after heroin OD.   10/08/20: Seen and examined at his bedside.  He reports having severe back pain earlier, improved with pain medications down to 5 out of 10 from 10 out of 10.  Assessment/Plan: Principal Problem:   Empyema lung (HCC) Active Problems:   Polysubstance dependence including opioid type drug with complication, episodic abuse (HCC)   Morbid obesity with BMI of 50.0-59.9, adult (HCC)   Tobacco dependence  Right-sided loculated pleural effusion with concern for empyema, unclear etiology Self-reported recently treated for aspiration pneumonia Independently viewed chest x-ray done on 10/08/2020 which shows loculated pleural effusion affecting right middle and right lower lobe. CTS following with plans for chest tube placement on 10/09/2020, will be n.p.o. after midnight. Continue IV antibiotics empirically. Will need pleural fluid sample sent for analysis Continue to follow cultures.  Acute hypoxic respiratory failure secondary to  above. Not on oxygen supplementation at baseline Currently requiring 3 liters to maintain O2 saturation greater than 90%.  Chronic normocytic anemia Hemoglobin 10.9 from 11.0 on presentation.  MCV 93. No overt bleeding. Continue to monitor H&H.  IV drug abuse  Self-reported history of IV drug abuse with hemorrhoids.  Back pain Reports back pain for the past 3 weeks. Pain control  Severe morbid obesity BMI 52 Recommend weight loss with regular physical activity and healthy dieting.     Code Status: Full code.  Family Communication: None at bedside.  Disposition Plan: Likely will discharge to home once CTS signs off.   Consultants:  CTS.  Procedures:  2D echo  Antimicrobials:  Cefepime  DVT prophylaxis: Subcu Lovenox daily  Status is: Inpatient    Dispo: The patient is from: Home.               Anticipated d/c is to: Home.               Patient currently not stable for discharge due to ongoing management of right-sided loculated pleural effusion with concern for empyema.   Difficult to place patient         Objective: Vitals:   10/07/20 2330 10/08/20 0324 10/08/20 0821 10/08/20 1142  BP: 138/89 135/89 (!) 145/91 131/80  Pulse: 76 73 87 81  Resp: 20 20 13 20   Temp: 98.7 F (37.1 C) 98 F (36.7 C) 98.6 F (37 C) 97.8 F (36.6 C)  TempSrc: Oral Oral Oral Oral  SpO2: 99% 97% 96% 95%  Weight:      Height:        Intake/Output Summary (Last 24 hours) at 10/08/2020 1553 Last data filed at 10/08/2020 1500 Gross per 24 hour  Intake 2774.06 ml  Output 1700 ml  Net 1074.06 ml   Filed Weights   10/07/20 1006  Weight: (!) 146.7 kg    Exam:  . General: 30 y.o. year-old male morbidly obese in no acute distress.  Alert and oriented x3. . Cardiovascular: Regular rate and rhythm with no rubs or gallops.  No thyromegaly or JVD noted.   Marland Kitchen Respiratory: Poor air entry involving right lower lobe.  Poor inspiratory effort.  . Abdomen: Soft nontender  nondistended with normal bowel sounds x4 quadrants. . Musculoskeletal: No lower extremity edema. 2/4 pulses in all 4 extremities. . Skin: No ulcerative lesions noted or rashes, . Psychiatry: Mood is appropriate for condition and setting   Data Reviewed: CBC: Recent Labs  Lab 10/07/20 1101 10/08/20 0132  WBC 7.3 7.5  NEUTROABS 5.6  --   HGB 11.0* 10.9*  HCT 33.6* 34.2*  MCV 91.8 93.2  PLT 358 349   Basic Metabolic Panel: Recent Labs  Lab 10/07/20 1101 10/08/20 0132  NA 134* 133*  K 4.2 4.6  CL 99 98  CO2 25 27  GLUCOSE 103* 112*  BUN 10 11  CREATININE 0.76 0.78  CALCIUM 9.1 8.9   GFR: Estimated Creatinine Clearance: 183.6 mL/min (by C-G formula based on SCr of 0.78 mg/dL). Liver Function Tests: Recent Labs  Lab 10/07/20 1101  AST 29  ALT 17  ALKPHOS 128*  BILITOT 0.6  PROT 8.0  ALBUMIN 2.4*   No results for input(s): LIPASE, AMYLASE in the last 168 hours. No results for input(s): AMMONIA in the last 168 hours. Coagulation Profile: No results for input(s): INR, PROTIME in the last 168 hours. Cardiac Enzymes: No results for input(s): CKTOTAL, CKMB, CKMBINDEX, TROPONINI in the last 168 hours. BNP (last 3 results) No results for input(s): PROBNP in the last 8760 hours. HbA1C: No results for input(s): HGBA1C in the last 72 hours. CBG: No results for input(s): GLUCAP in the last 168 hours. Lipid Profile: No results for input(s): CHOL, HDL, LDLCALC, TRIG, CHOLHDL, LDLDIRECT in the last 72 hours. Thyroid Function Tests: No results for input(s): TSH, T4TOTAL, FREET4, T3FREE, THYROIDAB in the last 72 hours. Anemia Panel: No results for input(s): VITAMINB12, FOLATE, FERRITIN, TIBC, IRON, RETICCTPCT in the last 72 hours. Urine analysis: No results found for: COLORURINE, APPEARANCEUR, LABSPEC, PHURINE, GLUCOSEU, HGBUR, BILIRUBINUR, KETONESUR, PROTEINUR, UROBILINOGEN, NITRITE, LEUKOCYTESUR Sepsis Labs: (procalcitonin:4,lacticidven:4)  ) Recent Results  (from the past 240 hour(s))  Culture, blood (routine x 2)     Status: None (Preliminary result)   Collection Time: 10/07/20 11:01 AM   Specimen: BLOOD LEFT HAND  Result Value Ref Range Status   Specimen Description BLOOD LEFT HAND  Final   Special Requests   Final    BOTTLES DRAWN AEROBIC ONLY Blood Culture results may not be optimal due to an inadequate volume of blood received in culture bottles   Culture   Final    NO GROWTH < 24 HOURS Performed at [facility], [address], [place], [place] [address]    Report Status PENDING  Incomplete  Culture, blood (routine x 2)     Status: None (Preliminary result)   Collection Time: 10/07/20 11:14 AM   Specimen: BLOOD RIGHT HAND  Result Value Ref Range Status   Specimen Description BLOOD RIGHT HAND  Final   Special Requests   Final    BOTTLES DRAWN AEROBIC ONLY Blood Culture results may not be optimal due to an inadequate volume of blood received in culture bottles   Culture   Final    NO GROWTH < 24 HOURS Performed at [facility], [address] [place], [place] [address]    Report Status PENDING  Incomplete      Studies: DG CHEST PORT 1 VIEW  Result Date: 10/08/2020 CLINICAL DATA:  Short of breath, cough, right-sided empyema EXAM: PORTABLE CHEST 1 VIEW COMPARISON:  CT scan 10/06/2020 FINDINGS: Rounded airspace opacity occupying the right mid and lower lung consistent with known loculated pleural effusion/empyema as seen on recent CT imaging. There is associated atelectasis in the adjacent right middle and right lower lobes. Cardiac and mediastinal contours are normal. The lungs are otherwise clear. No pneumothorax. No acute osseous abnormality. IMPRESSION: Loculated pleural effusion/empyema present in the right mid and lower lung. Associated mild atelectasis in the adjacent displaced right middle and lower lobes. Electronically Signed   By: Malachy MoanHeath  McCullough M.D.   On: 10/08/2020 10:08   ECHOCARDIOGRAM COMPLETE  Result  Date: 10/08/2020    ECHOCARDIOGRAM REPORT   Patient Name:   Ileene RubensRACEY Thull Date of Exam: 10/08/2020 Medical Rec #:  [id number]    Height:       66.0 in Accession #:    [id number]   Weight:       323.4 lb Date of Birth:  08-20-1990    BSA:          2.453 m Patient Age:    29 years     BP:           145/91 mmHg Patient Gender: M            HR:           67 bpm. Exam Location:  Inpatient Procedure: 2D Echo, Cardiac Doppler, Color Doppler and Intracardiac            Opacification Agent Indications:    Endocarditis I38  History:        Patient has no prior history of Echocardiogram examinations.                 Risk Factors:Current Smoker. Large right empyema with a 3-week                 history of progressive right-sided back pain and shortness of                 breath. Polysubstance abuse. Asthma.  Sonographer:    Leta Junglingiffany Cooper RDCS Referring Phys: [id number] JENNIFER YATES IMPRESSIONS  1. Left ventricular ejection fraction, by estimation, is 60 to 65%. The left ventricle has normal function. The left ventricle has no regional wall motion abnormalities. Left ventricular diastolic parameters were normal.  2. Right ventricular systolic function is normal. The right ventricular size is normal.  3. The mitral valve is normal in structure. No evidence of mitral valve regurgitation. No evidence of mitral stenosis.  4. The aortic valve is normal in structure. Aortic valve regurgitation is not visualized. No aortic stenosis is present.  5. The inferior vena cava is normal in size with greater than 50% respiratory variability, suggesting right atrial pressure of 3 mmHg. FINDINGS  Left Ventricle: Left ventricular ejection fraction, by estimation, is 60 to 65%. The left ventricle has normal function. The left ventricle has no regional wall motion abnormalities. Definity contrast agent was  given IV to delineate the left ventricular  endocardial borders. The left ventricular internal cavity size was normal in size. There is no left  ventricular hypertrophy. Left ventricular diastolic parameters were normal. Right Ventricle: The right ventricular size is normal. No increase in right ventricular wall thickness. Right ventricular systolic function is normal. Left Atrium: Left atrial size was normal in size. Right Atrium: Right atrial size was normal in size. Pericardium: There is no evidence of pericardial effusion. Mitral Valve: The mitral valve is normal in structure. No evidence of mitral valve regurgitation. No evidence of mitral valve stenosis. Tricuspid Valve: The tricuspid valve is normal in structure. Tricuspid valve regurgitation is not demonstrated. No evidence of tricuspid stenosis. Aortic Valve: The aortic valve is normal in structure. Aortic valve regurgitation is not visualized. No aortic stenosis is present. Pulmonic Valve: The pulmonic valve was normal in structure. Pulmonic valve regurgitation is not visualized. No evidence of pulmonic stenosis. Aorta: The aortic root is normal in size and structure. Venous: The inferior vena cava is normal in size with greater than 50% respiratory variability, suggesting right atrial pressure of 3 mmHg. IAS/Shunts: No atrial level shunt detected by color flow Doppler.  LEFT VENTRICLE PLAX 2D LVIDd:         4.90 cm      Diastology LVIDs:         3.30 cm      LV e' medial:    8.38 cm/s LV PW:         0.90 cm      LV E/e' medial:  14.0 LV IVS:        0.90 cm      LV e' lateral:   16.00 cm/s LVOT diam:     2.20 cm      LV E/e' lateral: 7.3 LV SV:         86 LV SV Index:   35 LVOT Area:     3.80 cm  LV Volumes (MOD) LV vol d, MOD A2C: 180.5 ml LV vol d, MOD A4C: 171.5 ml LV vol s, MOD A2C: 55.5 ml LV vol s, MOD A4C: 76.7 ml LV SV MOD A2C:     125.0 ml LV SV MOD A4C:     171.5 ml LV SV MOD BP:      107.7 ml RIGHT VENTRICLE RV S prime:     15.10 cm/s TAPSE (M-mode): 2.0 cm LEFT ATRIUM             Index       RIGHT ATRIUM          Index LA diam:        3.70 cm 1.51 cm/m  RA Area:     5.59 cm LA Vol  (A2C):   32.9 ml 13.41 ml/m RA Volume:   8.80 ml  3.59 ml/m LA Vol (A4C):   24.6 ml 10.03 ml/m LA Biplane Vol: 29.5 ml 12.03 ml/m  AORTIC VALVE LVOT Vmax:   125.00 cm/s LVOT Vmean:  85.900 cm/s LVOT VTI:    0.227 m  AORTA Ao Root diam: 3.10 cm MITRAL VALVE MV Area (PHT): 4.15 cm     SHUNTS MV Decel Time: 183 msec     Systemic VTI:  0.23 m MV E velocity: 117.00 cm/s  Systemic Diam: 2.20 cm MV A velocity: 79.30 cm/s MV E/A ratio:  1.48 Chilton Si MD Electronically signed by Chilton Si MD Signature Date/Time: 10/08/2020/1:48:31 PM    Final     Scheduled  Meds: . buprenorphine-naloxone  1 tablet Sublingual BID  . cloNIDine  0.1 mg Oral QID   Followed by  . [START ON 10/09/2020] cloNIDine  0.1 mg Oral BH-qamhs   Followed by  . [START ON 10/11/2020] cloNIDine  0.1 mg Oral QAC breakfast  . nicotine  14 mg Transdermal Daily  . sodium chloride flush  3 mL Intravenous Q12H    Continuous Infusions: . ceFEPime (MAXIPIME) IV 2 g (10/08/20 1306)  . lactated ringers 100 mL/hr at 10/08/20 1429     LOS: 1 day     Darlin Drop, MD [facility] Pager [phone]  If 7PM-7AM, please contact night-coverage [email or web address] Password [facility] 10/08/2020, 3:53 PM

## 2020-10-08 NOTE — Progress Notes (Signed)
      [address]       [name] [address]             [phone]           Subjective: Patient states breathing "so so".  Objective: Vital signs in last 24 hours: Temp:  [97.9 F (36.6 C)-98.8 F (37.1 C)] 98 F (36.7 C) (04/25 0324) Pulse Rate:  [73-98] 73 (04/25 0324) Cardiac Rhythm: Normal sinus rhythm (04/24 1930) Resp:  [19-22] 20 (04/25 0324) BP: (131-158)/(78-99) 135/89 (04/25 0324) SpO2:  [96 %-99 %] 97 % (04/25 0324) Weight:  [146.7 kg] 146.7 kg (04/24 1006)      Intake/Output from previous day: 04/24 0701 - 04/25 0700 In: 1704.4 [P.O.:240; I.V.:1464.4] Out: 1000 [Urine:1000]   Physical Exam:  Cardiovascular: RRR Pulmonary: Diminished bibasilar breath sounds R>L Abdomen: Soft, obese,non tender, bowel sounds present.    Lab Results: OZH:YQMVHQ Labs    10/07/20 1101 10/08/20 0132  WBC 7.3 7.5  HGB 11.0* 10.9*  HCT 33.6* 34.2*  PLT 358 349   BMET:  Recent Labs    10/07/20 1101 10/08/20 0132  NA 134* 133*  K 4.2 4.6  CL 99 98  CO2 25 27  GLUCOSE 103* 112*  BUN 10 11  CREATININE 0.76 0.78  CALCIUM 9.1 8.9    PT/INR: No results for input(s): LABPROT, INR in the last 72 hours. ABG:  INR: Will add last result for INR, ABG once components are confirmed Will add last 4 CBG results once components are confirmed  Assessment/Plan:  1. CV - SR with HR in the 70's this am. 2.  Pulmonary - On 2 liters of oxygen via [place]. Continue Duoneb. Will undergo, right VATS, possible right mini thoracotomy in the am with Dr. Laneta Simmers. Pre op orders placed 3. Anemia-H and H stable at 10.9 and 34.2 4. History of polysubstance abuse 5. ID-on Cefepime 2 grams IV tid.  Sera Hitsman M ZimmermanPA-C 10/08/2020,7:06 AM [phone]

## 2020-10-08 NOTE — Progress Notes (Signed)
Procedure(s) (LRB): VIDEO ASSISTED THORACOSCOPY (VATS)/EMPYEMA (Right) possible limited THORACOTOMY (Right) Subjective: Some wheezing today. Breathing about the same with some SOB, back pain.  Objective: Vital signs in last 24 hours: Temp:  [97.8 F (36.6 C)-98.8 F (37.1 C)] 98 F (36.7 C) (04/25 1649) Pulse Rate:  [73-98] 89 (04/25 1649) Cardiac Rhythm: Normal sinus rhythm (04/25 0821) Resp:  [13-20] 20 (04/25 1649) BP: (131-155)/(78-91) 140/86 (04/25 1649) SpO2:  [95 %-99 %] 96 % (04/25 1649)  Hemodynamic parameters for last 24 hours:    Intake/Output from previous day: 04/24 0701 - 04/25 0700 In: 1704.4 [P.O.:240; I.V.:1464.4] Out: 1000 [Urine:1000] Intake/Output this shift: Total I/O In: 1069.7 [I.V.:769.7; IV Piggyback:300] Out: 1450 [Urine:1450]  General appearance: alert and cooperative Heart: regular rate and rhythm Lungs: diminished breath sounds right hemithorax  Lab Results: Recent Labs    10/07/20 1101 10/08/20 0132  WBC 7.3 7.5  HGB 11.0* 10.9*  HCT 33.6* 34.2*  PLT 358 349   BMET:  Recent Labs    10/07/20 1101 10/08/20 0132  NA 134* 133*  K 4.2 4.6  CL 99 98  CO2 25 27  GLUCOSE 103* 112*  BUN 10 11  CREATININE 0.76 0.78  CALCIUM 9.1 8.9    PT/INR: No results for input(s): LABPROT, INR in the last 72 hours. ABG No results found for: PHART, HCO3, TCO2, ACIDBASEDEF, O2SAT CBG (last 3)  No results for input(s): GLUCAP in the last 72 hours.  Assessment/Plan:  Right empyema. Plan drainage tomorrow by VATS or thoracotomy. I discussed procedure again with patient and father at bedside. All questions answered.   LOS: 1 day    [name] 10/08/2020

## 2020-10-09 ENCOUNTER — Other Ambulatory Visit: Payer: Self-pay

## 2020-10-09 ENCOUNTER — Encounter (HOSPITAL_COMMUNITY)
Admission: AD | Disposition: A | Discharge status: Home / Self Care | Payer: Self-pay | Source: Other Acute Inpatient Hospital | Attending: Internal Medicine

## 2020-10-09 ENCOUNTER — Inpatient Hospital Stay (HOSPITAL_COMMUNITY): Payer: Self-pay

## 2020-10-09 ENCOUNTER — Inpatient Hospital Stay (HOSPITAL_COMMUNITY): Payer: Self-pay | Admitting: Certified Registered Nurse Anesthetist

## 2020-10-09 ENCOUNTER — Encounter (HOSPITAL_COMMUNITY): Payer: Self-pay | Admitting: Internal Medicine

## 2020-10-09 DIAGNOSIS — J869 Pyothorax without fistula: Principal | ICD-10-CM

## 2020-10-09 DIAGNOSIS — Z6841 Body Mass Index (BMI) 40.0 and over, adult: Secondary | ICD-10-CM

## 2020-10-09 DIAGNOSIS — R0902 Hypoxemia: Secondary | ICD-10-CM

## 2020-10-09 HISTORY — PX: THORACOTOMY: SHX5074

## 2020-10-09 LAB — POCT I-STAT 7, (LYTES, BLD GAS, ICA,H+H)
Acid-Base Excess: 2 mmol/L (ref 0.0–2.0)
Acid-Base Excess: 4 mmol/L — ABNORMAL HIGH (ref 0.0–2.0)
Acid-Base Excess: 2 mmol/L (ref 0.0–2.0)
Bicarbonate: 28.7 mmol/L — ABNORMAL HIGH (ref 20.0–28.0)
Bicarbonate: 30.4 mmol/L — ABNORMAL HIGH (ref 20.0–28.0)
Bicarbonate: 30.6 mmol/L — ABNORMAL HIGH (ref 20.0–28.0)
Calcium, Ion: 1.22 mmol/L (ref 1.15–1.40)
Calcium, Ion: 1.23 mmol/L (ref 1.15–1.40)
Calcium, Ion: 1.19 mmol/L (ref 1.15–1.40)
HCT: 29 % — ABNORMAL LOW (ref 39.0–52.0)
HCT: 30 % — ABNORMAL LOW (ref 39.0–52.0)
HCT: 30 % — ABNORMAL LOW (ref 39.0–52.0)
Hemoglobin: 10.2 g/dL — ABNORMAL LOW (ref 13.0–17.0)
Hemoglobin: 9.9 g/dL — ABNORMAL LOW (ref 13.0–17.0)
Hemoglobin: 10.2 g/dL — ABNORMAL LOW (ref 13.0–17.0)
O2 Saturation: 100 %
O2 Saturation: 99 %
O2 Saturation: 100 %
Patient temperature: 36.5
Patient temperature: 36.7
Patient temperature: 96.8
Potassium: 4.5 mmol/L (ref 3.5–5.1)
Potassium: 5.8 mmol/L — ABNORMAL HIGH (ref 3.5–5.1)
Potassium: 5.5 mmol/L — ABNORMAL HIGH (ref 3.5–5.1)
Sodium: 135 mmol/L (ref 135–145)
Sodium: 138 mmol/L (ref 135–145)
Sodium: 135 mmol/L (ref 135–145)
TCO2: 30 mmol/L (ref 22–32)
TCO2: 32 mmol/L (ref 22–32)
TCO2: 33 mmol/L — ABNORMAL HIGH (ref 22–32)
pCO2 arterial: 53.9 mmHg — ABNORMAL HIGH (ref 32.0–48.0)
pCO2 arterial: 57.2 mmHg — ABNORMAL HIGH (ref 32.0–48.0)
pCO2 arterial: 64.8 mmHg — ABNORMAL HIGH (ref 32.0–48.0)
pH, Arterial: 7.307 — ABNORMAL LOW (ref 7.350–7.450)
pH, Arterial: 7.357 (ref 7.350–7.450)
pH, Arterial: 7.277 — ABNORMAL LOW (ref 7.350–7.450)
pO2, Arterial: 146 mmHg — ABNORMAL HIGH (ref 83.0–108.0)
pO2, Arterial: 217 mmHg — ABNORMAL HIGH (ref 83.0–108.0)
pO2, Arterial: 203 mmHg — ABNORMAL HIGH (ref 83.0–108.0)

## 2020-10-09 LAB — CBC
HCT: 30.5 % — ABNORMAL LOW (ref 39.0–52.0)
Hemoglobin: 9.7 g/dL — ABNORMAL LOW (ref 13.0–17.0)
MCH: 29.5 pg (ref 26.0–34.0)
MCHC: 31.8 g/dL (ref 30.0–36.0)
MCV: 92.7 fL (ref 80.0–100.0)
Platelets: 324 10*3/uL (ref 150–400)
RBC: 3.29 MIL/uL — ABNORMAL LOW (ref 4.22–5.81)
RDW: 13.2 % (ref 11.5–15.5)
WBC: 8.2 10*3/uL (ref 4.0–10.5)
nRBC: 0 % (ref 0.0–0.2)

## 2020-10-09 LAB — SURGICAL PCR SCREEN
MRSA, PCR: POSITIVE — AB
Staphylococcus aureus: POSITIVE — AB

## 2020-10-09 LAB — GLUCOSE, CAPILLARY
Glucose-Capillary: 115 mg/dL — ABNORMAL HIGH (ref 70–99)
Glucose-Capillary: 140 mg/dL — ABNORMAL HIGH (ref 70–99)

## 2020-10-09 LAB — BASIC METABOLIC PANEL
Anion gap: 7 (ref 5–15)
BUN: 13 mg/dL (ref 6–20)
CO2: 28 mmol/L (ref 22–32)
Calcium: 8.9 mg/dL (ref 8.9–10.3)
Chloride: 100 mmol/L (ref 98–111)
Creatinine, Ser: 0.87 mg/dL (ref 0.61–1.24)
GFR, Estimated: >60 mL/min (ref >60)
Glucose, Bld: 138 mg/dL — ABNORMAL HIGH (ref 70–99)
Potassium: 5 mmol/L (ref 3.5–5.1)
Sodium: 135 mmol/L (ref 135–145)

## 2020-10-09 IMAGING — DX DG CHEST 1V PORT
1 series · 1 of 1 positions shown · non-contrast
Comparison: Radiograph yesterday.

CLINICAL DATA: Surgery follow-up.

EXAM:
PORTABLE CHEST 1 VIEW

[chest]
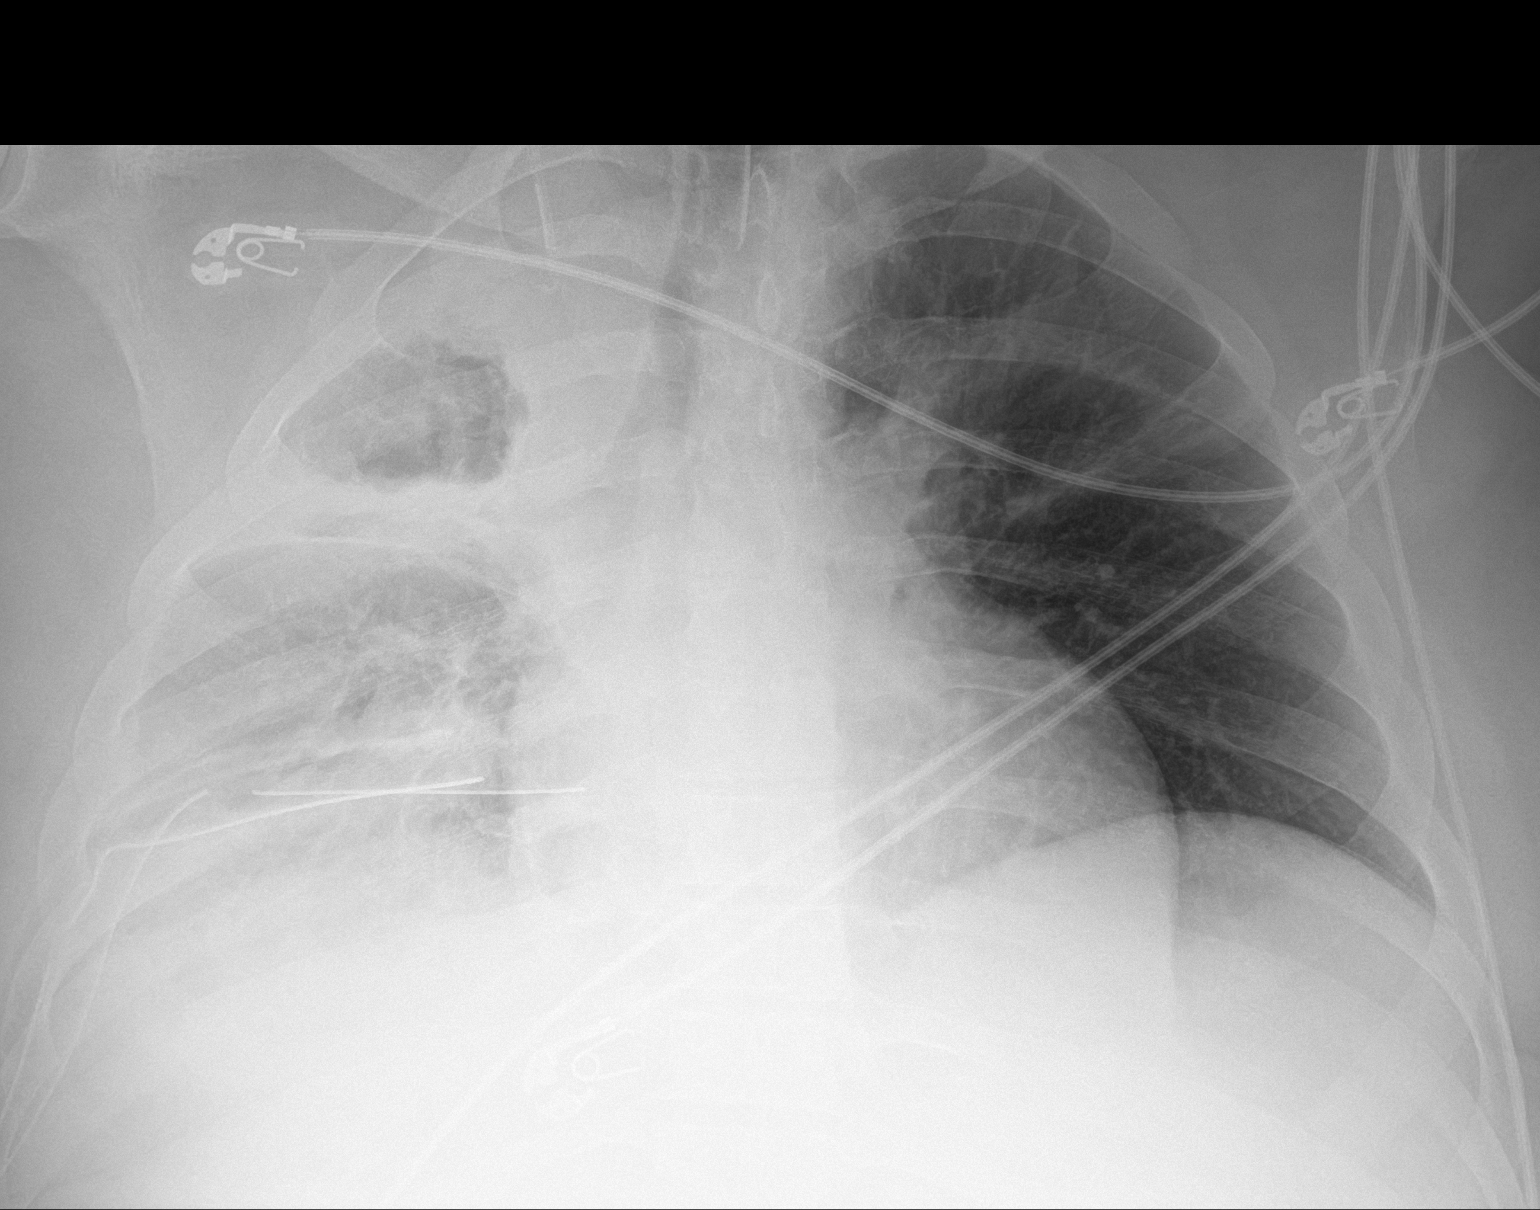

[1 of 1 positions shown; findings below may reference images not displayed]

FINDINGS: The endotracheal tube tip is at the level of the clavicular heads
5.5 cm from the carina. Right internal jugular central line tip
projects over the region of the distal jugular vein. Two right
basilar chest tubes in place. Decreased right pleural effusion.
Residual pleural fluid at the lung base and tracking into the
fissure. Patchy opacity throughout the right lower lung zone.
Confluent opacity in the right upper lung zone may represent
loculated pleural fluid or atelectasis/partial lobar collapse. No
visualized pneumothorax. Left lung is clear. Heart size is normal
for technique.
IMPRESSION: 1. Two right basilar chest tubes in place with decreased right
pleural effusion.
2. Small volume residual pleural fluid tracks into the minor fissure
and at the lung base. Patchy opacity throughout the right
hemithorax. Confluent opacity in the right upper lung zone may
represent loculated pleural fluid or atelectasis/partial lobar
collapse.
3. Endotracheal tube tip 5.5 cm from the carina.

## 2020-10-09 IMAGING — DX DG ABDOMEN 1V
1 series · 1 of 1 positions shown · non-contrast
Comparison: Same day chest radiograph

CLINICAL DATA: Assess OG tube placement

EXAM:
ABDOMEN - 1 VIEW

[abdomen]
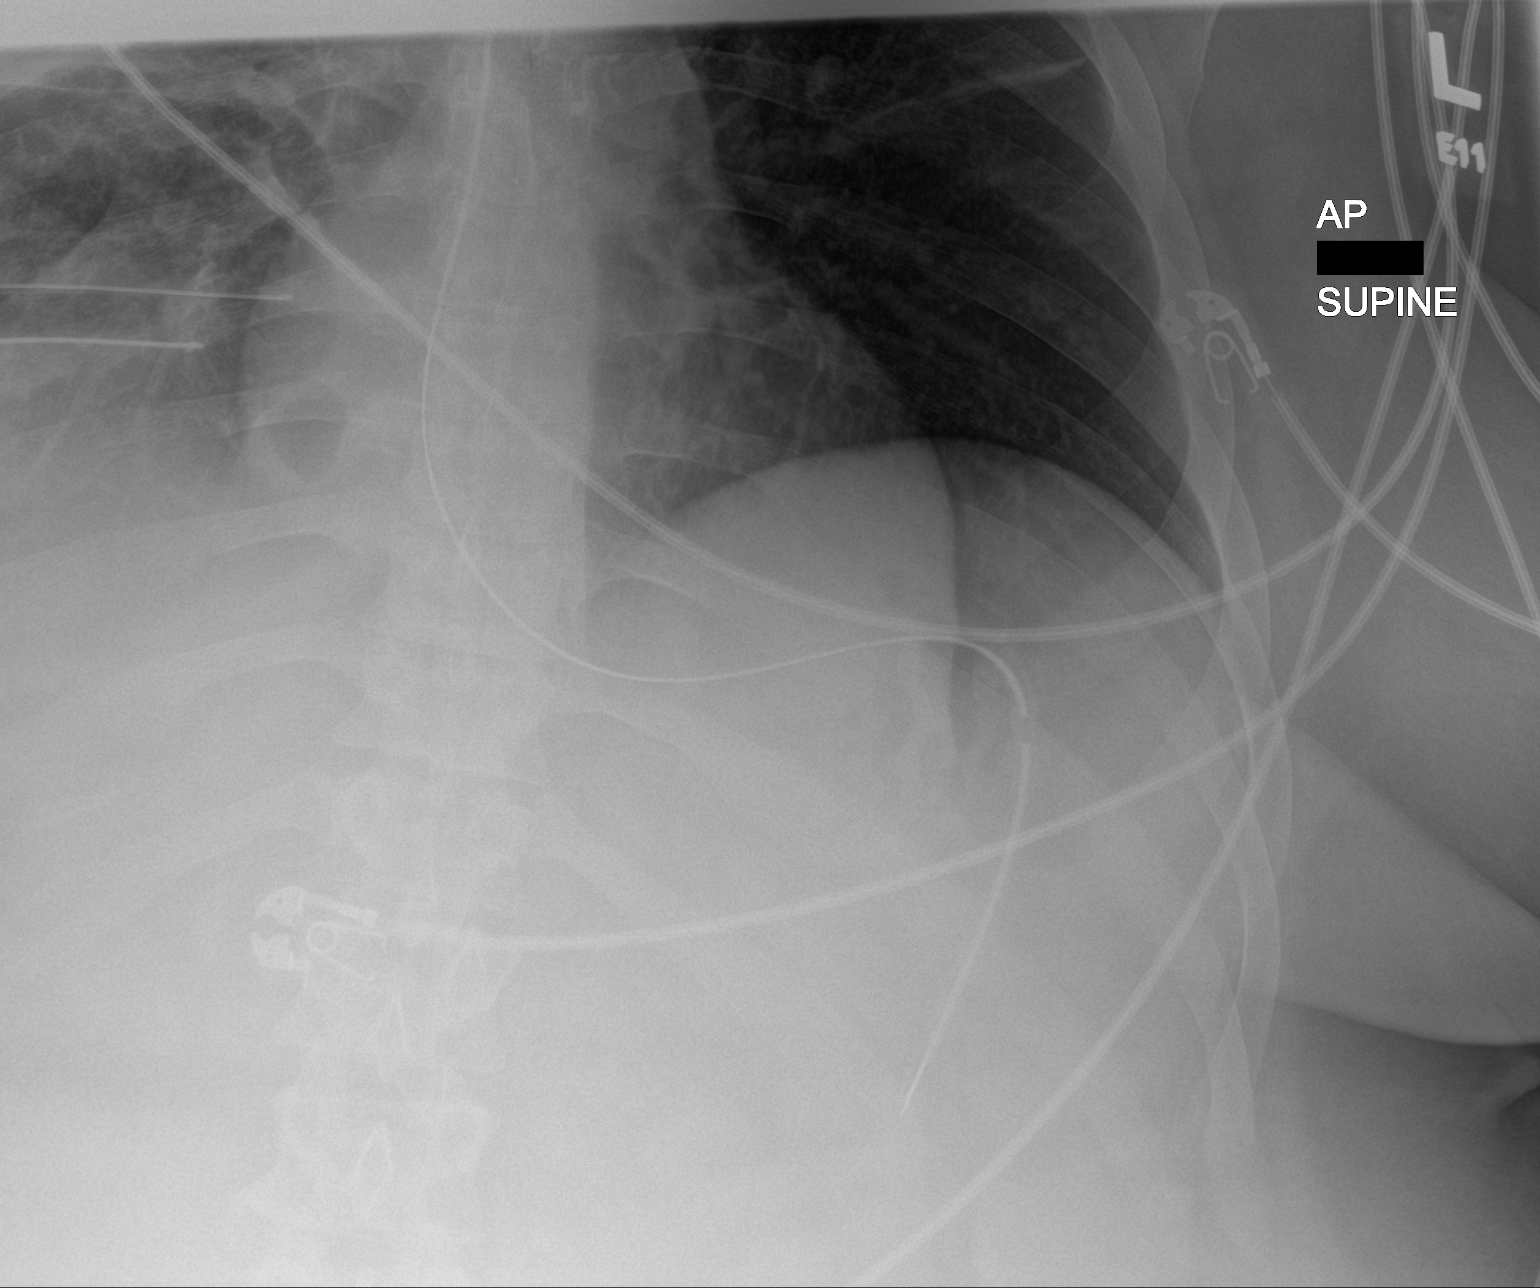

[1 of 1 positions shown; findings below may reference images not displayed]

FINDINGS: Orogastric tube with tip and side port overlying the stomach. The
bowel gas pattern is normal. Right basilar thoracostomy tube with
right-sided pleural effusion.
IMPRESSION: Orogastric tube with tip and side port overlying the stomach.

## 2020-10-09 SURGERY — THORACOTOMY, MAJOR
Anesthesia: General | Site: Chest | Laterality: Right

## 2020-10-09 MED ORDER — ONDANSETRON HCL 4 MG/2ML IJ SOLN
INTRAMUSCULAR | Status: DC | PRN
  Administered 2020-10-09: 4 mg via INTRAVENOUS

## 2020-10-09 MED ORDER — DEXMEDETOMIDINE HCL IN NACL 400 MCG/100ML IV SOLN
0.4000 ug/kg/h | INTRAVENOUS | Status: AC
  Administered 2020-10-09: .4 ug/kg/h via INTRAVENOUS
  Filled 2020-10-09: qty 100

## 2020-10-09 MED ORDER — FENTANYL CITRATE (PF) 100 MCG/2ML IJ SOLN: 100.0000 ug | INTRAMUSCULAR | Status: DC | PRN

## 2020-10-09 MED ORDER — MIDAZOLAM HCL 2 MG/2ML IJ SOLN
INTRAMUSCULAR | Status: AC
  Administered 2020-10-09: 2 mg via INTRAVENOUS
  Filled 2020-10-09: qty 2

## 2020-10-09 MED ORDER — PROTAMINE SULFATE 10 MG/ML IV SOLN
INTRAVENOUS | Status: AC
  Filled 2020-10-09: qty 5

## 2020-10-09 MED ORDER — LIDOCAINE 2% (20 MG/ML) 5 ML SYRINGE
INTRAMUSCULAR | Status: AC
  Filled 2020-10-09: qty 5

## 2020-10-09 MED ORDER — PROPOFOL 1000 MG/100ML IV EMUL
INTRAVENOUS | Status: AC
  Filled 2020-10-09: qty 100

## 2020-10-09 MED ORDER — PROPOFOL 10 MG/ML IV BOLUS
INTRAVENOUS | Status: DC | PRN
  Administered 2020-10-09: 200 mg via INTRAVENOUS
  Administered 2020-10-09 (×2): 100 mg via INTRAVENOUS

## 2020-10-09 MED ORDER — MIDAZOLAM HCL 2 MG/2ML IJ SOLN: 2.0000 mg | Freq: Once | INTRAMUSCULAR | Status: AC

## 2020-10-09 MED ORDER — ALBUTEROL SULFATE HFA 108 (90 BASE) MCG/ACT IN AERS
INHALATION_SPRAY | RESPIRATORY_TRACT | Status: DC | PRN
  Administered 2020-10-09: 4 via RESPIRATORY_TRACT

## 2020-10-09 MED ORDER — VASOPRESSIN 20 UNIT/ML IV SOLN
INTRAVENOUS | Status: DC | PRN
  Administered 2020-10-09: 1 [IU] via INTRAVENOUS
  Administered 2020-10-09: 2 [IU] via INTRAVENOUS
  Administered 2020-10-09 (×4): 1 [IU] via INTRAVENOUS

## 2020-10-09 MED ORDER — ONDANSETRON HCL 4 MG/2ML IJ SOLN: 4.0000 mg | Freq: Four times a day (QID) | INTRAMUSCULAR | Status: DC | PRN

## 2020-10-09 MED ORDER — CHLORHEXIDINE GLUCONATE 0.12% ORAL RINSE (MEDLINE KIT)
15.0000 mL | Freq: Two times a day (BID) | OROMUCOSAL | Status: DC
  Administered 2020-10-09: 15 mL via OROMUCOSAL

## 2020-10-09 MED ORDER — FENTANYL CITRATE (PF) 100 MCG/2ML IJ SOLN
50.0000 ug | Freq: Once | INTRAMUSCULAR | Status: AC
  Administered 2020-10-09: 50 ug via INTRAVENOUS
  Filled 2020-10-09: qty 2

## 2020-10-09 MED ORDER — PROPOFOL 10 MG/ML IV BOLUS
INTRAVENOUS | Status: AC
  Filled 2020-10-09: qty 20

## 2020-10-09 MED ORDER — METHOCARBAMOL 500 MG PO TABS
500.0000 mg | ORAL_TABLET | Freq: Three times a day (TID) | ORAL | Status: DC | PRN
  Administered 2020-10-10 – 2020-10-12 (×5): 500 mg
  Filled 2020-10-09 (×5): qty 1

## 2020-10-09 MED ORDER — ACETAMINOPHEN 500 MG PO TABS: 1000.0000 mg | ORAL_TABLET | Freq: Four times a day (QID) | ORAL | Status: DC

## 2020-10-09 MED ORDER — DEXAMETHASONE SODIUM PHOSPHATE 10 MG/ML IJ SOLN
INTRAMUSCULAR | Status: DC | PRN
  Administered 2020-10-09: 10 mg via INTRAVENOUS

## 2020-10-09 MED ORDER — FENTANYL CITRATE (PF) 100 MCG/2ML IJ SOLN
INTRAMUSCULAR | Status: DC | PRN
  Administered 2020-10-09: 250 ug via INTRAVENOUS
  Administered 2020-10-09: 100 ug via INTRAVENOUS
  Administered 2020-10-09: 150 ug via INTRAVENOUS
  Administered 2020-10-09: 50 ug via INTRAVENOUS

## 2020-10-09 MED ORDER — VANCOMYCIN HCL 10 G IV SOLR
2500.0000 mg | Freq: Once | INTRAVENOUS | Status: DC
  Filled 2020-10-09: qty 2500

## 2020-10-09 MED ORDER — CHLORHEXIDINE GLUCONATE 0.12% ORAL RINSE (MEDLINE KIT): 15.0000 mL | Freq: Two times a day (BID) | OROMUCOSAL | Status: DC

## 2020-10-09 MED ORDER — PROPOFOL 10 MG/ML IV BOLUS
INTRAVENOUS | Status: AC
  Filled 2020-10-09: qty 40

## 2020-10-09 MED ORDER — ORAL CARE MOUTH RINSE: 15.0000 mL | OROMUCOSAL | Status: DC

## 2020-10-09 MED ORDER — PHENYLEPHRINE 40 MCG/ML (10ML) SYRINGE FOR IV PUSH (FOR BLOOD PRESSURE SUPPORT)
PREFILLED_SYRINGE | INTRAVENOUS | Status: DC | PRN
  Administered 2020-10-09 (×3): 80 ug via INTRAVENOUS

## 2020-10-09 MED ORDER — ROCURONIUM BROMIDE 10 MG/ML (PF) SYRINGE
PREFILLED_SYRINGE | INTRAVENOUS | Status: AC
  Filled 2020-10-09: qty 10

## 2020-10-09 MED ORDER — MUPIROCIN 2 % EX OINT
1.0000 "application " | TOPICAL_OINTMENT | Freq: Two times a day (BID) | CUTANEOUS | Status: AC
  Administered 2020-10-09 – 2020-10-13 (×10): 1 via NASAL
  Filled 2020-10-09 (×4): qty 22

## 2020-10-09 MED ORDER — PHENYLEPHRINE 40 MCG/ML (10ML) SYRINGE FOR IV PUSH (FOR BLOOD PRESSURE SUPPORT)
PREFILLED_SYRINGE | INTRAVENOUS | Status: AC
  Filled 2020-10-09: qty 10

## 2020-10-09 MED ORDER — ORAL CARE MOUTH RINSE
15.0000 mL | OROMUCOSAL | Status: DC
  Administered 2020-10-09 – 2020-10-10 (×5): 15 mL via OROMUCOSAL

## 2020-10-09 MED ORDER — ONDANSETRON HCL 4 MG/2ML IJ SOLN
INTRAMUSCULAR | Status: AC
  Filled 2020-10-09: qty 2

## 2020-10-09 MED ORDER — CLONIDINE HCL 0.1 MG PO TABS
0.1000 mg | ORAL_TABLET | ORAL | Status: AC
  Administered 2020-10-10 (×2): 0.1 mg
  Filled 2020-10-09 (×2): qty 1

## 2020-10-09 MED ORDER — POLYETHYLENE GLYCOL 3350 17 G PO PACK
17.0000 g | PACK | Freq: Every day | ORAL | Status: DC
  Administered 2020-10-10 – 2020-10-11 (×2): 17 g
  Filled 2020-10-09 (×2): qty 1

## 2020-10-09 MED ORDER — FENTANYL CITRATE (PF) 100 MCG/2ML IJ SOLN
INTRAMUSCULAR | Status: AC
  Filled 2020-10-09: qty 2

## 2020-10-09 MED ORDER — CLONIDINE HCL 0.1 MG PO TABS
0.1000 mg | ORAL_TABLET | Freq: Every day | ORAL | Status: AC
  Administered 2020-10-11 – 2020-10-12 (×2): 0.1 mg
  Filled 2020-10-09 (×2): qty 1

## 2020-10-09 MED ORDER — DEXAMETHASONE SODIUM PHOSPHATE 10 MG/ML IJ SOLN
INTRAMUSCULAR | Status: AC
  Filled 2020-10-09: qty 1

## 2020-10-09 MED ORDER — CHLORHEXIDINE GLUCONATE 0.12 % MT SOLN
OROMUCOSAL | Status: AC
  Administered 2020-10-09: 15 mL
  Filled 2020-10-09: qty 15

## 2020-10-09 MED ORDER — SODIUM CHLORIDE 0.9 % IV SOLN: INTRAVENOUS | Status: DC | PRN

## 2020-10-09 MED ORDER — VANCOMYCIN HCL 1250 MG/250ML IV SOLN
1250.0000 mg | Freq: Three times a day (TID) | INTRAVENOUS | Status: DC
  Administered 2020-10-10 – 2020-10-11 (×4): 1250 mg via INTRAVENOUS
  Filled 2020-10-09 (×6): qty 250

## 2020-10-09 MED ORDER — SENNOSIDES-DOCUSATE SODIUM 8.6-50 MG PO TABS
2.0000 | ORAL_TABLET | Freq: Every day | ORAL | Status: DC
  Administered 2020-10-09: 2 via ORAL
  Filled 2020-10-09: qty 2

## 2020-10-09 MED ORDER — 0.9 % SODIUM CHLORIDE (POUR BTL) OPTIME
TOPICAL | Status: DC | PRN
  Administered 2020-10-09: 1000 mL

## 2020-10-09 MED ORDER — OXYCODONE HCL 5 MG PO TABS
10.0000 mg | ORAL_TABLET | ORAL | Status: DC
  Administered 2020-10-09: 10 mg via ORAL
  Filled 2020-10-09: qty 2

## 2020-10-09 MED ORDER — CHLORHEXIDINE GLUCONATE CLOTH 2 % EX PADS
6.0000 | MEDICATED_PAD | Freq: Every day | CUTANEOUS | Status: AC
  Administered 2020-10-09 – 2020-10-13 (×6): 6 via TOPICAL

## 2020-10-09 MED ORDER — VANCOMYCIN HCL 1250 MG/250ML IV SOLN
1250.0000 mg | Freq: Three times a day (TID) | INTRAVENOUS | Status: DC
  Filled 2020-10-09 (×2): qty 250

## 2020-10-09 MED ORDER — LACTATED RINGERS IV SOLN: INTRAVENOUS | Status: DC | PRN

## 2020-10-09 MED ORDER — ALBUTEROL SULFATE HFA 108 (90 BASE) MCG/ACT IN AERS
INHALATION_SPRAY | RESPIRATORY_TRACT | Status: AC
  Filled 2020-10-09: qty 6.7

## 2020-10-09 MED ORDER — LOPERAMIDE HCL 1 MG/7.5ML PO SUSP
2.0000 mg | ORAL | Status: AC | PRN
  Filled 2020-10-09: qty 30

## 2020-10-09 MED ORDER — NOREPINEPHRINE 4 MG/250ML-% IV SOLN: 0.0000 ug/min | INTRAVENOUS | Status: DC

## 2020-10-09 MED ORDER — SENNOSIDES-DOCUSATE SODIUM 8.6-50 MG PO TABS
2.0000 | ORAL_TABLET | Freq: Every day | ORAL | Status: DC
  Administered 2020-10-10 – 2020-10-11 (×2): 2
  Filled 2020-10-09 (×2): qty 2

## 2020-10-09 MED ORDER — FENTANYL 2500MCG IN NS 250ML (10MCG/ML) PREMIX INFUSION
50.0000 ug/h | INTRAVENOUS | Status: DC
  Administered 2020-10-09: 50 ug/h via INTRAVENOUS
  Administered 2020-10-10: 175 ug/h via INTRAVENOUS
  Filled 2020-10-09 (×2): qty 250

## 2020-10-09 MED ORDER — DOCUSATE SODIUM 50 MG/5ML PO LIQD
100.0000 mg | Freq: Two times a day (BID) | ORAL | Status: DC
  Administered 2020-10-09 – 2020-10-12 (×6): 100 mg
  Filled 2020-10-09 (×6): qty 10

## 2020-10-09 MED ORDER — KETOROLAC TROMETHAMINE 15 MG/ML IJ SOLN: 15.0000 mg | Freq: Four times a day (QID) | INTRAMUSCULAR | Status: DC | PRN

## 2020-10-09 MED ORDER — ACETAMINOPHEN 500 MG PO TABS
1000.0000 mg | ORAL_TABLET | Freq: Four times a day (QID) | ORAL | Status: DC
  Administered 2020-10-10 – 2020-10-12 (×9): 1000 mg
  Filled 2020-10-09 (×10): qty 2

## 2020-10-09 MED ORDER — PROPOFOL 500 MG/50ML IV EMUL
INTRAVENOUS | Status: DC | PRN
  Administered 2020-10-09: 40 ug/kg/min via INTRAVENOUS

## 2020-10-09 MED ORDER — SENNOSIDES-DOCUSATE SODIUM 8.6-50 MG PO TABS: 1.0000 | ORAL_TABLET | Freq: Every day | ORAL | Status: DC

## 2020-10-09 MED ORDER — ACETAMINOPHEN 160 MG/5ML PO SOLN
1000.0000 mg | Freq: Four times a day (QID) | ORAL | Status: DC
  Administered 2020-10-10: 1000 mg
  Filled 2020-10-09: qty 40.6

## 2020-10-09 MED ORDER — DEXMEDETOMIDINE HCL IN NACL 400 MCG/100ML IV SOLN: 0.4000 ug/kg/h | INTRAVENOUS | Status: DC

## 2020-10-09 MED ORDER — VASOPRESSIN 20 UNIT/ML IV SOLN
INTRAVENOUS | Status: AC
  Filled 2020-10-09: qty 1

## 2020-10-09 MED ORDER — ALBUMIN HUMAN 5 % IV SOLN: INTRAVENOUS | Status: DC | PRN

## 2020-10-09 MED ORDER — NOREPINEPHRINE 4 MG/250ML-% IV SOLN
0.0000 ug/min | INTRAVENOUS | Status: AC
  Administered 2020-10-09: 2 ug/min via INTRAVENOUS
  Filled 2020-10-09: qty 250

## 2020-10-09 MED ORDER — VANCOMYCIN HCL 10 G IV SOLR
2500.0000 mg | Freq: Once | INTRAVENOUS | Status: AC
  Administered 2020-10-09: 2500 mg via INTRAVENOUS
  Filled 2020-10-09: qty 2500

## 2020-10-09 MED ORDER — DICYCLOMINE HCL 20 MG PO TABS
20.0000 mg | ORAL_TABLET | Freq: Four times a day (QID) | ORAL | Status: DC | PRN
  Administered 2020-10-11 – 2020-10-12 (×7): 20 mg
  Filled 2020-10-09 (×9): qty 1

## 2020-10-09 MED ORDER — FENTANYL CITRATE (PF) 250 MCG/5ML IJ SOLN
INTRAMUSCULAR | Status: AC
  Filled 2020-10-09: qty 10

## 2020-10-09 MED ORDER — ROCURONIUM BROMIDE 100 MG/10ML IV SOLN
INTRAVENOUS | Status: DC | PRN
  Administered 2020-10-09: 100 mg via INTRAVENOUS
  Administered 2020-10-09 (×3): 50 mg via INTRAVENOUS

## 2020-10-09 MED ORDER — MIDAZOLAM HCL 2 MG/2ML IJ SOLN
INTRAMUSCULAR | Status: AC
  Filled 2020-10-09: qty 2

## 2020-10-09 MED ORDER — PANTOPRAZOLE SODIUM 40 MG IV SOLR
40.0000 mg | Freq: Every day | INTRAVENOUS | Status: DC
  Administered 2020-10-09 – 2020-10-15 (×7): 40 mg via INTRAVENOUS
  Filled 2020-10-09 (×7): qty 40

## 2020-10-09 MED ORDER — LABETALOL HCL 5 MG/ML IV SOLN
INTRAVENOUS | Status: AC
  Filled 2020-10-09: qty 4

## 2020-10-09 MED ORDER — LABETALOL HCL 5 MG/ML IV SOLN
INTRAVENOUS | Status: DC | PRN
  Administered 2020-10-09: 10 mg via INTRAVENOUS

## 2020-10-09 MED ORDER — FENTANYL BOLUS VIA INFUSION
50.0000 ug | INTRAVENOUS | Status: DC | PRN
  Filled 2020-10-09: qty 100

## 2020-10-09 MED ORDER — PROPOFOL 500 MG/50ML IV EMUL
INTRAVENOUS | Status: AC
  Filled 2020-10-09: qty 50

## 2020-10-09 MED ORDER — OXYCODONE HCL 5 MG PO TABS
10.0000 mg | ORAL_TABLET | ORAL | Status: AC
  Administered 2020-10-10 – 2020-10-11 (×11): 10 mg
  Filled 2020-10-09 (×12): qty 2

## 2020-10-09 MED ORDER — HYDROXYZINE HCL 25 MG PO TABS
25.0000 mg | ORAL_TABLET | Freq: Four times a day (QID) | ORAL | Status: DC | PRN
  Administered 2020-10-11 – 2020-10-12 (×7): 25 mg
  Filled 2020-10-09 (×7): qty 1

## 2020-10-09 MED ORDER — DEXMEDETOMIDINE HCL IN NACL 400 MCG/100ML IV SOLN
0.0000 ug/kg/h | INTRAVENOUS | Status: DC
  Administered 2020-10-09: 1.2 ug/kg/h via INTRAVENOUS
  Administered 2020-10-09 (×2): 1 ug/kg/h via INTRAVENOUS
  Administered 2020-10-10: 1.2 ug/kg/h via INTRAVENOUS
  Administered 2020-10-10: 1 ug/kg/h via INTRAVENOUS
  Administered 2020-10-10: 1.2 ug/kg/h via INTRAVENOUS
  Administered 2020-10-10: 0.8 ug/kg/h via INTRAVENOUS
  Administered 2020-10-10: 1 ug/kg/h via INTRAVENOUS
  Administered 2020-10-10: 1.2 ug/kg/h via INTRAVENOUS
  Filled 2020-10-09 (×10): qty 100

## 2020-10-09 MED ORDER — MIDAZOLAM HCL 2 MG/2ML IJ SOLN
INTRAMUSCULAR | Status: DC | PRN
  Administered 2020-10-09 (×3): 2 mg via INTRAVENOUS

## 2020-10-09 MED ORDER — ACETAMINOPHEN 160 MG/5ML PO SOLN
1000.0000 mg | Freq: Four times a day (QID) | ORAL | Status: DC
  Administered 2020-10-09: 1000 mg via ORAL
  Filled 2020-10-09: qty 40.6

## 2020-10-09 SURGICAL SUPPLY — 75 items
BLADE CLIPPER SURG (BLADE) ×2 IMPLANT
CANISTER SUCT 3000ML PPV (MISCELLANEOUS) ×4 IMPLANT
CATH KIT ON-Q SILVERSOAK 5 (CATHETERS) IMPLANT
CATH KIT ON-Q SILVERSOAK 5IN (CATHETERS) IMPLANT
CATH THORACIC 28FR (CATHETERS) IMPLANT
CATH THORACIC 36FR (CATHETERS) ×1 IMPLANT
CATH THORACIC 36FR RT ANG (CATHETERS) ×1 IMPLANT
CLEANER TIP ELECTROSURG 2X2 (MISCELLANEOUS) ×3 IMPLANT
CLIP VESOCCLUDE MED 6/CT (CLIP) ×2 IMPLANT
CLIP VESOCCLUDE SM WIDE 6/CT (CLIP) ×2 IMPLANT
CNTNR URN SCR LID CUP LEK RST (MISCELLANEOUS) ×2 IMPLANT
CONN ST 1/4X3/8  BEN (MISCELLANEOUS)
CONN ST 1/4X3/8 BEN (MISCELLANEOUS) IMPLANT
CONN Y 3/8X3/8X3/8  BEN (MISCELLANEOUS)
CONN Y 3/8X3/8X3/8 BEN (MISCELLANEOUS) IMPLANT
CONT SPEC 4OZ STRL OR WHT (MISCELLANEOUS) ×2
DEFOGGER SCOPE WARMER CLEARIFY (MISCELLANEOUS) ×1 IMPLANT
DERMABOND ADVANCED (GAUZE/BANDAGES/DRESSINGS)
DERMABOND ADVANCED .7 DNX12 (GAUZE/BANDAGES/DRESSINGS) IMPLANT
DRAIN CHANNEL 28F RND 3/8 FF (WOUND CARE) IMPLANT
DRAIN CHANNEL 32F RND 10.7 FF (WOUND CARE) IMPLANT
DRAPE INCISE IOBAN 66X45 STRL (DRAPES) ×1 IMPLANT
DRAPE LAPAROSCOPIC ABDOMINAL (DRAPES) ×2 IMPLANT
DRAPE WARM FLUID 44X44 (DRAPES) ×2 IMPLANT
DRESSING AQUACEL AG SP 3.5X10 (GAUZE/BANDAGES/DRESSINGS) IMPLANT
DRSG AQUACEL AG SP 3.5X10 (GAUZE/BANDAGES/DRESSINGS) ×2
ELECT REM PT RETURN 9FT ADLT (ELECTROSURGICAL) ×2
ELECTRODE REM PT RTRN 9FT ADLT (ELECTROSURGICAL) ×1 IMPLANT
GAUZE SPONGE 4X4 12PLY STRL (GAUZE/BANDAGES/DRESSINGS) ×2 IMPLANT
GLOVE BIO SURGEON STRL SZ 6.5 (GLOVE) ×4 IMPLANT
GLOVE SURG MICRO LTX SZ7 (GLOVE) ×4 IMPLANT
GOWN STRL REUS W/ TWL LRG LVL3 (GOWN DISPOSABLE) ×4 IMPLANT
GOWN STRL REUS W/ TWL XL LVL3 (GOWN DISPOSABLE) ×1 IMPLANT
GOWN STRL REUS W/TWL LRG LVL3 (GOWN DISPOSABLE) ×4
GOWN STRL REUS W/TWL XL LVL3 (GOWN DISPOSABLE) ×1
HANDLE STAPLE ENDO GIA SHORT (STAPLE)
KIT BASIN OR (CUSTOM PROCEDURE TRAY) ×2 IMPLANT
KIT SUCTION CATH 14FR (SUCTIONS) ×2 IMPLANT
KIT TURNOVER KIT B (KITS) ×2 IMPLANT
NS IRRIG 1000ML POUR BTL (IV SOLUTION) ×8 IMPLANT
PACK CHEST (CUSTOM PROCEDURE TRAY) ×2 IMPLANT
PAD ARMBOARD 7.5X6 YLW CONV (MISCELLANEOUS) ×4 IMPLANT
PASSER SUT SWANSON 36MM LOOP (INSTRUMENTS) IMPLANT
SEALANT SURG COSEAL 4ML (VASCULAR PRODUCTS) IMPLANT
STAPLER ENDO GIA 12 SHRT THIN (STAPLE) IMPLANT
STAPLER ENDO GIA 12MM SHORT (STAPLE) IMPLANT
SUT PROLENE 3 0 SH DA (SUTURE) IMPLANT
SUT PROLENE 4 0 RB 1 (SUTURE)
SUT PROLENE 4-0 RB1 .5 CRCL 36 (SUTURE) IMPLANT
SUT SILK  1 MH (SUTURE) ×3
SUT SILK 1 MH (SUTURE) ×2 IMPLANT
SUT SILK 1 TIES 10X30 (SUTURE) IMPLANT
SUT SILK 3 0SH CR/8 30 (SUTURE) IMPLANT
SUT VIC AB 1 CTX 18 (SUTURE) IMPLANT
SUT VIC AB 1 CTX 36 (SUTURE) ×2
SUT VIC AB 1 CTX36XBRD ANBCTR (SUTURE) ×1 IMPLANT
SUT VIC AB 2-0 CT1 27 (SUTURE)
SUT VIC AB 2-0 CT1 TAPERPNT 27 (SUTURE) IMPLANT
SUT VIC AB 2-0 CTX 36 (SUTURE) ×3 IMPLANT
SUT VIC AB 3-0 SH 27 (SUTURE)
SUT VIC AB 3-0 SH 27X BRD (SUTURE) IMPLANT
SUT VIC AB 3-0 X1 27 (SUTURE) ×3 IMPLANT
SUT VICRYL 2 TP 1 (SUTURE) ×1 IMPLANT
SWAB COLLECTION DEVICE MRSA (MISCELLANEOUS) IMPLANT
SWAB CULTURE ESWAB REG 1ML (MISCELLANEOUS) IMPLANT
SYSTEM SAHARA CHEST DRAIN ATS (WOUND CARE) ×2 IMPLANT
TAPE CLOTH 4X10 WHT NS (GAUZE/BANDAGES/DRESSINGS) ×2 IMPLANT
TIP APPLICATOR SPRAY EXTEND 16 (VASCULAR PRODUCTS) IMPLANT
TOWEL GREEN STERILE (TOWEL DISPOSABLE) ×4 IMPLANT
TOWEL GREEN STERILE FF (TOWEL DISPOSABLE) ×2 IMPLANT
TRAP SPECIMEN MUCUS 40CC (MISCELLANEOUS) ×1 IMPLANT
TRAY FOLEY MTR SLVR 14FR STAT (SET/KITS/TRAYS/PACK) ×2 IMPLANT
TROCAR XCEL BLADELESS 5X75MML (TROCAR) ×1 IMPLANT
TUNNELER SHEATH ON-Q 11GX8 DSP (PAIN MANAGEMENT) IMPLANT
WATER STERILE IRR 1000ML POUR (IV SOLUTION) ×4 IMPLANT

## 2020-10-09 NOTE — Progress Notes (Signed)
Critical ABG values given to CCM. RR changed to 20.

## 2020-10-09 NOTE — Progress Notes (Addendum)
Pt ambulated to bathroom with walker and on 2 LPM of O2 NCL continueously. Pt complained having SOB with exertion. SPO2 95-97%. Will monitor.    10/08/20 2030  Mobility  Head of Bed Elevated  Self regulated  Activity Ambulated to bathroom  Range of Motion/Exercises Active;All extremities  Level of Assistance Standby assist, set-up cues, supervision of patient - no hands on  Assistive Device Front wheel walker;BSC  Minutes Ambulated 10 minutes  Distance Ambulated (ft) 30 ft  Mobility Response Tolerated fair  Mobility performed by Nurse;Nurse tech  Bed Position Semi-fowlers  Transport method Ambulatory  Mobility Out of bed for toileting    Filiberto Pinks, RN

## 2020-10-09 NOTE — Transfer of Care (Signed)
Immediate Anesthesia Transfer of Care Note  Patient: Derek Woods  Procedure(s) Performed: RIGHT VATS/ THORACOTOMY/EMPYEMA (Right Chest)  Patient Location: ICU  Anesthesia Type:General  Level of Consciousness: Patient remains intubated per anesthesia plan  Airway & Oxygen Therapy: Patient placed on Ventilator (see vital sign flow sheet for setting)  Post-op Assessment: Report given to RN and Post -op Vital signs reviewed and stable  Post vital signs: Reviewed  Last Vitals:  Vitals Value Taken Time  BP 111/67 10/09/20 1717  Temp    Pulse 91 10/09/20 1724  Resp 44 10/09/20 1724  SpO2 100 % 10/09/20 1724  Vitals shown include unvalidated device data.  Last Pain:  Vitals:   10/09/20 1140  TempSrc: Oral  PainSc: 7       Patients Stated Pain Goal: 4 (10/07/20 1835)  Complications: No complications documented.

## 2020-10-09 NOTE — Anesthesia Postprocedure Evaluation (Signed)
Anesthesia Post Note  Patient: Derek Woods  Procedure(s) Performed: RIGHT VATS/ THORACOTOMY/EMPYEMA (Right Chest)     Patient location during evaluation: SICU Anesthesia Type: General Level of consciousness: sedated Pain management: pain level controlled Vital Signs Assessment: post-procedure vital signs reviewed and stable Respiratory status: patient remains intubated per anesthesia plan Cardiovascular status: stable Postop Assessment: no apparent nausea or vomiting Anesthetic complications: no   No complications documented.  Last Vitals:  Vitals:   10/09/20 2000 10/09/20 2030  BP: 107/63 107/65  Pulse: 69 63  Resp: 18 (!) 21  Temp:    SpO2: 100% 97%    Last Pain:  Vitals:   10/09/20 2000  TempSrc:   PainSc: 7                  Kaylah Chiasson P Shantrice Rodenberg     

## 2020-10-09 NOTE — Brief Op Note (Signed)
10/07/2020 - 10/09/2020  3:50 PM  PATIENT:  Derek Woods  30 y.o. male  PRE-OPERATIVE DIAGNOSIS: Right Empyema POST-OPERATIVE DIAGNOSIS:   Right Empyema   PROCEDURE: RIGHT THORACOTOMY DRAINAGE OF EMPYEMA   SURGEON:  [name], MD - Primary  PHYSICIAN ASSISTANT: BARRETT / Nainoa Woldt   ANESTHESIA:   general  EBL:  <27ml  BLOOD ADMINISTERED:none  DRAINS: Right pleural drain, Foley catheter  LOCAL MEDICATIONS USED:  NONE  SPECIMEN:  Source of Specimen:  Right pleural fluid  DISPOSITION OF SPECIMEN:  microbiology  COUNTS:  YES  DICTATION: .Dragon Dictation  PLAN OF CARE: Admit to inpatient   PATIENT DISPOSITION:  ICU - intubated and hemodynamically stable.   Delay start of Pharmacological VTE agent (>24hrs) due to surgical blood loss or risk of bleeding: yes

## 2020-10-09 NOTE — Anesthesia Postprocedure Evaluation (Signed)
Anesthesia Post Note  Patient: Building surveyor  Procedure(s) Performed: RIGHT VATS/ THORACOTOMY/EMPYEMA (Right Chest)     Patient location during evaluation: SICU Anesthesia Type: General Level of consciousness: sedated Pain management: pain level controlled Vital Signs Assessment: post-procedure vital signs reviewed and stable Respiratory status: patient remains intubated per anesthesia plan Cardiovascular status: stable Postop Assessment: no apparent nausea or vomiting Anesthetic complications: no   No complications documented.  Last Vitals:  Vitals:   10/09/20 2000 10/09/20 2030  BP: 107/63 107/65  Pulse: 69 63  Resp: 18 (!) 21  Temp:    SpO2: 100% 97%    Last Pain:  Vitals:   10/09/20 2000  TempSrc:   PainSc: 7                  Vonne Mcdanel P Huan Pollok

## 2020-10-09 NOTE — Interval H&P Note (Signed)
History and Physical Interval Note:  10/09/2020 1:16 PM  Derek Woods  has presented today for surgery, with the diagnosis of right emyema.  The various methods of treatment have been discussed with the patient and family. After consideration of risks, benefits and other options for treatment, the patient has consented to  Procedure(s): VIDEO ASSISTED THORACOSCOPY (VATS)/EMPYEMA (Right) possible limited THORACOTOMY (Right) as a surgical intervention.  The patient's history has been reviewed, patient examined, no change in status, stable for surgery.  I have reviewed the patient's chart and labs.  Questions were answered to the patient's satisfaction.     [name]

## 2020-10-09 NOTE — Anesthesia Procedure Notes (Signed)
Central Venous Catheter Insertion Performed by: Atilano Median, DO, anesthesiologist Start/End4/26/2022 1:10 PM, 10/09/2020 1:35 PM Patient location: Pre-op. Preanesthetic checklist: patient identified, IV checked, site marked, risks and benefits discussed, surgical consent, monitors and equipment checked, pre-op evaluation, timeout performed and anesthesia consent Position: Trendelenburg Lidocaine 1% used for infiltration and patient sedated Hand hygiene performed  and maximum sterile barriers used  Catheter size: 8.5 Fr Central line was placed.Sheath introducer Procedure performed using ultrasound guided technique. Ultrasound Notes:anatomy identified, needle tip was noted to be adjacent to the nerve/plexus identified, no ultrasound evidence of intravascular and/or intraneural injection and image(s) printed for medical record Attempts: 2 Following insertion, line sutured, dressing applied and Biopatch. Post procedure assessment: blood return through all ports, free fluid flow and no air  Patient tolerated the procedure well with no immediate complications. Additional procedure comments: Initial attempt with double lumen CVC unable to thread catheter over wire with highly collapsible vessel. Single attempt with 8.5 Fr introducer successful. Marland Kitchen

## 2020-10-09 NOTE — Anesthesia Preprocedure Evaluation (Addendum)
Anesthesia Evaluation  Patient identified by MRN, date of birth, ID band Patient awake    Reviewed: Allergy & Precautions, NPO status , Patient's Chart, lab work & pertinent test results  Airway Mallampati: III  TM Distance: >3 FB Neck ROM: Full    Dental  (+) Teeth Intact   Pulmonary asthma , Current Smoker,  Right empyema    + rhonchi  + decreased breath sounds  (-) rales    Cardiovascular negative cardio ROS   Rhythm:Regular Rate:Normal     Neuro/Psych Anxiety negative neurological ROS     GI/Hepatic negative GI ROS, (+)     substance abuse  marijuana use and IV drug use,   Endo/Other  negative endocrine ROSMorbid obesity  Renal/GU negative Renal ROS  negative genitourinary   Musculoskeletal negative musculoskeletal ROS (+) narcotic dependent  Abdominal (+) + obese,  Abdomen: soft.    Peds  Hematology   Anesthesia Other Findings Reports last heroin usage 10/08/20. Prior history of suboxone usage, not current.  Reproductive/Obstetrics                           Anesthesia Physical Anesthesia Plan  ASA: III  Anesthesia Plan: General   Post-op Pain Management:    Induction: Intravenous  PONV Risk Score and Plan: 1 and Ondansetron, Dexamethasone, Midazolam and Treatment may vary due to age or medical condition  Airway Management Planned: Mask and Double Lumen EBT  Additional Equipment: Arterial line and CVP  Intra-op Plan:   Post-operative Plan: Possible Post-op intubation/ventilation  Informed Consent: I have reviewed the patients History and Physical, chart, labs and discussed the procedure including the risks, benefits and alternatives for the proposed anesthesia with the patient or authorized representative who has indicated his/her understanding and acceptance.     Dental advisory given  Plan Discussed with: CRNA  Anesthesia Plan Comments: (Lab Results       Component                Value               Date                      WBC                      7.5                 10/08/2020                HGB                      10.9 (L)            10/08/2020                HCT                      34.2 (L)            10/08/2020                MCV                      93.2                10/08/2020                PLT  349                 10/08/2020           Lab Results      Component                Value               Date                      NA                       133 (L)             10/08/2020                K                        4.6                 10/08/2020                CO2                      27                  10/08/2020                GLUCOSE                  112 (H)             10/08/2020                BUN                      11                  10/08/2020                CREATININE               0.78                10/08/2020                CALCIUM                  8.9                 10/08/2020                GFRNONAA                 >60                 10/08/2020           ECHO 10/08/20: 1. Left ventricular ejection fraction, by estimation, is 60 to 65%. The  left ventricle has normal function. The left ventricle has no regional  wall motion abnormalities. Left ventricular diastolic parameters were  normal.  2. Right ventricular systolic function is normal. The right ventricular  size is normal.  3. The mitral valve is normal in structure. No evidence of mitral valve  regurgitation. No evidence of mitral stenosis.  4. The aortic valve is normal in structure. Aortic valve regurgitation is  not visualized. No aortic stenosis is present.  5. The inferior vena  cava is normal in size with greater than 50%  respiratory variability, suggesting right atrial pressure of 3 mmHg. )       Anesthesia Quick Evaluation

## 2020-10-09 NOTE — Progress Notes (Signed)
Patient ID: Derek Woods, male   DOB: June 20, 1990, 30 y.o.   MRN: [id number]  TCTS Evening Rounds:   Hemodynamically stable   Sedated on vent. CCM has seen him. Plan to keep on vent for now and reevaluate in am.   Urine output good  CT output low, no air leak.  CBC    Component Value Date/Time   WBC 7.5 10/08/2020 0132   RBC 3.67 (L) 10/08/2020 0132   HGB 10.2 (L) 10/09/2020 1751   HCT 30.0 (L) 10/09/2020 1751   PLT 349 10/08/2020 0132   MCV 93.2 10/08/2020 0132   MCH 29.7 10/08/2020 0132   MCHC 31.9 10/08/2020 0132   RDW 13.6 10/08/2020 0132   LYMPHSABS 1.3 10/07/2020 1101   MONOABS 0.3 10/07/2020 1101   EOSABS 0.1 10/07/2020 1101   BASOSABS 0.0 10/07/2020 1101     BMET    Component Value Date/Time   NA 135 10/09/2020 1751   K 5.5 (H) 10/09/2020 1751   CL 98 10/08/2020 0132   CO2 27 10/08/2020 0132   GLUCOSE 112 (H) 10/08/2020 0132   BUN 11 10/08/2020 0132   CREATININE 0.78 10/08/2020 0132   CALCIUM 8.9 10/08/2020 0132   GFRNONAA >60 10/08/2020 0132     A/P:   I suspect that he may have aspirated in the OR. Continue Vanc/Maxipime. Repeat CXR in am.

## 2020-10-09 NOTE — Anesthesia Procedure Notes (Addendum)
Procedure Name: Intubation Date/Time: 10/09/2020 2:44 PM Performed by: Audie Pinto, CRNA Pre-anesthesia Checklist: Patient identified, Emergency Drugs available, Suction available and Patient being monitored Patient Re-evaluated:Patient Re-evaluated prior to induction Oxygen Delivery Method: Circle system utilized Preoxygenation: Pre-oxygenation with 100% oxygen Induction Type: IV induction Ventilation: Two handed mask ventilation required and Oral airway inserted - appropriate to patient size Laryngoscope Size: Glidescope and 3 Grade View: Grade III Tube type: Oral Tube size: 8.0 mm Number of attempts: 1 Airway Equipment and Method: Stylet and Oral airway Placement Confirmation: ETT inserted through vocal cords under direct vision,  positive ETCO2 and breath sounds checked- equal and bilateral Secured at: 24 cm Tube secured with: Tape Dental Injury: Teeth and Oropharynx as per pre-operative assessment  Difficulty Due To: Difficulty was unanticipated and Difficult Airway- due to large tongue Future Recommendations: Recommend- induction with short-acting agent, and alternative techniques readily available Comments: Intubation by Samarrah Tranchina. DL with glidescope 3, grade III view, 8.0 ett passed

## 2020-10-09 NOTE — Progress Notes (Signed)
Lab notified result of surgical screening for MRSA and Staphylococcus aureus is positive. Initiated Bactroban ointment and CHG per standing order. Pt education given. Per-op check list done. Inform consent signed.  Filiberto Pinks, RN

## 2020-10-09 NOTE — Progress Notes (Signed)
Pharmacy Antibiotic Note  Derek Woods is a 30 y.o. male admitted on 10/07/2020 with loculated pleural effusion with possible empyema pending VATS/thoracotomy. Patient screened MRSA positive. Pharmacy has been consulted for vancomycin and cefepime dosing.  4/26 Vancomycin 1250mg  Q 8 hr Scr used: 0.8 mg/dL Weight: kg Vd coeff: 0.5 L/kg Est AUC: 488  Plan: Vancomycin loading dose 2500mg  IV x1 Vancomycin 1250 mgV Q 8 hours.  Goal AUC 400-550 Cefepime 2g q8 hours Monitor cultures, clinical status, renal fx, vanc levels  Narrow abx as able and f/u duration   Height: 5\' 6"  (167.6 cm) Weight: (!) 146.7 kg (323 lb 6.4 oz) IBW/kg (Calculated) : 63.8  Temp (24hrs), Avg:98.1 F (36.7 C), Min:97.8 F (36.6 C), Max:98.6 F (37 C)  Recent Labs  Lab 10/07/20 1101 10/07/20 1412 10/08/20 0132  WBC 7.3  --  7.5  CREATININE 0.76  --  0.78  LATICACIDVEN 1.1 1.7  --     Estimated Creatinine Clearance: 183.6 mL/min (by C-G formula based on SCr of 0.78 mg/dL).    Allergies  Allergen Reactions  . Augmentin [Amoxicillin-Pot Clavulanate] Other (See Comments)    Pt does not recall, childhood  . Ceclor [Cefaclor] Other (See Comments)    Pt does not recall, childhood  . Sulfa Antibiotics Hives    Antimicrobials this admission: 4/24 vancomycin >> 4/24 cefepime >>  Dose adjustments this admission: N/A  Microbiology results: 4/24 BCx: ngtd(collected prior to abx) 4/25 MRSA +   Thank you for allowing pharmacy to be a part of this patient's care.  5/24, PharmD, BCPS, BCCP Clinical Pharmacist  Please check AMION for all [facility] Pharm[facility] phone numbers After 10:00 PM, call Main Pharmacy [phone]

## 2020-10-09 NOTE — Progress Notes (Signed)
PROGRESS NOTE  Derek Woods Funke NFA:[id number] DOB: 05-Feb-1991 DOA: 10/07/2020 PCP: Pcp, No  HPI/Recap of past 24 hours: Derek Woods Montoro is a 30 y.o. male with medical history significant of asthma; anxiety; morbid obesity; and polysubstance abuse presenting with R flank pain, back pain, and dyspnea worse than his usual asthma flare x2 days.  Had back pain for about 3 weeks.  No fevers.  No sick contacts.  Last heroin use was the day prior to presentation AM.  He has been on Subutex for a few months and did well until he broke his foot and couldn't make it to his drug tests.  He would like to start back on Suboxone.  He reports that he has a skin condition causing diffuse pustules with scarring.  He can't remember the name of it.  He previously had PNA and pleural effusion in January; per [facility] record, it was aspiration PNA after heroin OD.   Work-up revealed right-sided loculated pleural effusion with concern for empyema.  Seen by cardiothoracic surgery with plan for video-assisted thoracoscopy with likely chest tube placement.  10/09/20: Patient was seen at his bedside this morning prior to CTS evaluation.  Somnolent but easily arousable to voices.  He has no new complaints.  MRSA screen positive, will start IV vancomycin for broad coverage.  Follow fluid analysis results after his procedure.  Continue to follow blood cultures.  Assessment/Plan: Principal Problem:   Empyema lung (HCC) Active Problems:   Polysubstance dependence including opioid type drug with complication, episodic abuse (HCC)   Morbid obesity with BMI of 50.0-59.9, adult (HCC)   Tobacco dependence  Right-sided loculated pleural effusion with concern for empyema, unclear etiology Self-reported recently treated for aspiration pneumonia during his [facility]. Independently viewed chest x-ray done on 10/08/2020 which shows loculated pleural effusion affecting right middle and right lower lobe. CTS  following with plans for chest tube placement on 10/09/2020 Obtain sputum culture if able, follow. Blood cultures x2 negative to date. MRSA screen positive, add IV vancomycin, continue IV cefepime. Will need pleural fluid sample sent for analysis and culture  Acute hypoxic respiratory failure secondary to above. Not on oxygen supplementation at baseline Currently requiring 3 liters to maintain O2 saturation greater than 90%.  Chronic normocytic anemia Hemoglobin 10.9 from 11.0 on presentation.  MCV 93. No overt bleeding. Continue to monitor H&H.  Mild hypovolemic hyponatremia Serum sodium 133 Currently on LR at 100 cc/h Repeat BMP in the morning  History of IV drug abuse, heroin Self-reported history of IV drug abuse with hemorrhoids. He is currently on Suboxone Add Bowel regimen No evidence of bacteremia, blood cultures have been negative thus far, 2D echo no evidence of endocarditis.  Back pain Reports back pain for the past 3 weeks. Continue Pain control Obtain imaging if symptoms persist or if blood cultures come back positive.  Severe morbid obesity BMI 52 Recommend weight loss with regular physical activity and healthy dieting.     Code Status: Full code.  Family Communication: None at bedside.  Disposition Plan: Likely will discharge to home once CTS signs off.   Consultants:  CTS.  Procedures:  2D echo  Antimicrobials:  Cefepime  IV vancomycin, 10/09/2020>>  DVT prophylaxis: Subcu Lovenox daily  Status is: Inpatient    Dispo: The patient is from: Home.               Anticipated d/c is to: Home.  Patient currently not stable for discharge due to ongoing management of right-sided loculated pleural effusion with concern for empyema.   Difficult to place patient         Objective: Vitals:   10/08/20 2317 10/08/20 2319 10/09/20 0333 10/09/20 0821  BP:  135/81 140/86 140/86  Pulse: 94 84 80 83  Resp: (!) 22 19 19 19    Temp:  98 F (36.7 C) 98.1 F (36.7 C) 98.3 F (36.8 C)  TempSrc:  Oral Oral Oral  SpO2: 98% 97% 97% 96%  Weight:      Height:        Intake/Output Summary (Last 24 hours) at 10/09/2020 1105 Last data filed at 10/09/2020 10/11/2020 Gross per 24 hour  Intake 2658.1 ml  Output 4500 ml  Net -1841.9 ml   Filed Weights   10/07/20 1006  Weight: (!) 146.7 kg    Exam:  . General: 30 y.o. year-old male morbidly obese in no acute distress.  Somnolent but easily arousable to voice is not answering to questions appropriately.   . Cardiovascular: Regular rate and rhythm no rubs or gallops. 38 Respiratory: Poor air entry involving right lower lobe.  Points with effort. . Abdomen: Soft nontender normal bowel sounds present.   . Musculoskeletal: No lower extremity edema bilaterally. . Skin: Markings from healed previous ulcerative lesions. Marland Kitchen Psychiatry: Mood is appropriate to condition and setting.   Data Reviewed: CBC: Recent Labs  Lab 10/07/20 1101 10/08/20 0132  WBC 7.3 7.5  NEUTROABS 5.6  --   HGB 11.0* 10.9*  HCT 33.6* 34.2*  MCV 91.8 93.2  PLT 358 349   Basic Metabolic Panel: Recent Labs  Lab 10/07/20 1101 10/08/20 0132  NA 134* 133*  K 4.2 4.6  CL 99 98  CO2 25 27  GLUCOSE 103* 112*  BUN 10 11  CREATININE 0.76 0.78  CALCIUM 9.1 8.9   GFR: Estimated Creatinine Clearance: 183.6 mL/min (by C-G formula based on SCr of 0.78 mg/dL). Liver Function Tests: Recent Labs  Lab 10/07/20 1101  AST 29  ALT 17  ALKPHOS 128*  BILITOT 0.6  PROT 8.0  ALBUMIN 2.4*   No results for input(s): LIPASE, AMYLASE in the last 168 hours. No results for input(s): AMMONIA in the last 168 hours. Coagulation Profile: No results for input(s): INR, PROTIME in the last 168 hours. Cardiac Enzymes: No results for input(s): CKTOTAL, CKMB, CKMBINDEX, TROPONINI in the last 168 hours. BNP (last 3 results) No results for input(s): PROBNP in the last 8760 hours. HbA1C: No results for input(s):  HGBA1C in the last 72 hours. CBG: No results for input(s): GLUCAP in the last 168 hours. Lipid Profile: No results for input(s): CHOL, HDL, LDLCALC, TRIG, CHOLHDL, LDLDIRECT in the last 72 hours. Thyroid Function Tests: No results for input(s): TSH, T4TOTAL, FREET4, T3FREE, THYROIDAB in the last 72 hours. Anemia Panel: No results for input(s): VITAMINB12, FOLATE, FERRITIN, TIBC, IRON, RETICCTPCT in the last 72 hours. Urine analysis: No results found for: COLORURINE, APPEARANCEUR, LABSPEC, PHURINE, GLUCOSEU, HGBUR, BILIRUBINUR, KETONESUR, PROTEINUR, UROBILINOGEN, NITRITE, LEUKOCYTESUR Sepsis Labs: @LABRCNTIP (procalcitonin:4,lacticidven:4)  ) Recent Results (from the past 240 hour(s))  Culture, blood (routine x 2)     Status: None (Preliminary result)   Collection Time: 10/07/20 11:01 AM   Specimen: BLOOD LEFT HAND  Result Value Ref Range Status   Specimen Description BLOOD LEFT HAND  Final   Special Requests   Final    BOTTLES DRAWN AEROBIC ONLY Blood Culture results may not be optimal due  to an inadequate volume of blood received in culture bottles   Culture   Final    NO GROWTH 2 DAYS Performed at [facility], [address], [place], [place] [address]    Report Status PENDING  Incomplete  Culture, blood (routine x 2)     Status: None (Preliminary result)   Collection Time: 10/07/20 11:14 AM   Specimen: BLOOD RIGHT HAND  Result Value Ref Range Status   Specimen Description BLOOD RIGHT HAND  Final   Special Requests   Final    BOTTLES DRAWN AEROBIC ONLY Blood Culture results may not be optimal due to an inadequate volume of blood received in culture bottles   Culture   Final    NO GROWTH 2 DAYS Performed at [facility], [address], [place], [place] [address]    Report Status PENDING  Incomplete  Surgical PCR screen     Status: Abnormal   Collection Time: 10/08/20  8:58 PM   Specimen: Nasal Mucosa; Nasal Swab  Result Value Ref Range Status   MRSA, PCR  POSITIVE (A) NEGATIVE Final    Comment: RESULT CALLED TO, READ BACK BY AND VERIFIED WITH: O.P. RN 10/09/20 0015 JDW    Staphylococcus aureus POSITIVE (A) NEGATIVE Final    Comment: (NOTE) The Xpert SA Assay (FDA approved for NASAL specimens in patients 61 years of age and older), is one component of a comprehensive surveillance program. It is not intended to diagnose infection nor to guide or monitor treatment. Performed at [facility], [address] [place], [place] [address]       Studies: ECHOCARDIOGRAM COMPLETE  Result Date: 10/08/2020    ECHOCARDIOGRAM REPORT   Patient Name:   ELISHA COOKSEY Date of Exam: 10/08/2020 Medical Rec #:  [id number]    Height:       66.0 in Accession #:    [id number]   Weight:       323.4 lb Date of Birth:  1990-12-19    BSA:          2.453 m Patient Age:    29 years     BP:           145/91 mmHg Patient Gender: M            HR:           67 bpm. Exam Location:  Inpatient Procedure: 2D Echo, Cardiac Doppler, Color Doppler and Intracardiac            Opacification Agent Indications:    Endocarditis I38  History:        Patient has no prior history of Echocardiogram examinations.                 Risk Factors:Current Smoker. Large right empyema with a 3-week                 history of progressive right-sided back pain and shortness of                 breath. Polysubstance abuse. Asthma.  Sonographer:    Leta Jungling RDCS Referring Phys: [id number] JENNIFER YATES IMPRESSIONS  1. Left ventricular ejection fraction, by estimation, is 60 to 65%. The left ventricle has normal function. The left ventricle has no regional wall motion abnormalities. Left ventricular diastolic parameters were normal.  2. Right ventricular systolic function is normal. The right ventricular size is normal.  3. The mitral valve is normal in structure. No  evidence of mitral valve regurgitation. No evidence of mitral stenosis.  4. The aortic valve is normal in structure. Aortic valve regurgitation  is not visualized. No aortic stenosis is present.  5. The inferior vena cava is normal in size with greater than 50% respiratory variability, suggesting right atrial pressure of 3 mmHg. FINDINGS  Left Ventricle: Left ventricular ejection fraction, by estimation, is 60 to 65%. The left ventricle has normal function. The left ventricle has no regional wall motion abnormalities. Definity contrast agent was given IV to delineate the left ventricular  endocardial borders. The left ventricular internal cavity size was normal in size. There is no left ventricular hypertrophy. Left ventricular diastolic parameters were normal. Right Ventricle: The right ventricular size is normal. No increase in right ventricular wall thickness. Right ventricular systolic function is normal. Left Atrium: Left atrial size was normal in size. Right Atrium: Right atrial size was normal in size. Pericardium: There is no evidence of pericardial effusion. Mitral Valve: The mitral valve is normal in structure. No evidence of mitral valve regurgitation. No evidence of mitral valve stenosis. Tricuspid Valve: The tricuspid valve is normal in structure. Tricuspid valve regurgitation is not demonstrated. No evidence of tricuspid stenosis. Aortic Valve: The aortic valve is normal in structure. Aortic valve regurgitation is not visualized. No aortic stenosis is present. Pulmonic Valve: The pulmonic valve was normal in structure. Pulmonic valve regurgitation is not visualized. No evidence of pulmonic stenosis. Aorta: The aortic root is normal in size and structure. Venous: The inferior vena cava is normal in size with greater than 50% respiratory variability, suggesting right atrial pressure of 3 mmHg. IAS/Shunts: No atrial level shunt detected by color flow Doppler.  LEFT VENTRICLE PLAX 2D LVIDd:         4.90 cm      Diastology LVIDs:         3.30 cm      LV e' medial:    8.38 cm/s LV PW:         0.90 cm      LV E/e' medial:  14.0 LV IVS:        0.90 cm       LV e' lateral:   16.00 cm/s LVOT diam:     2.20 cm      LV E/e' lateral: 7.3 LV SV:         86 LV SV Index:   35 LVOT Area:     3.80 cm  LV Volumes (MOD) LV vol d, MOD A2C: 180.5 ml LV vol d, MOD A4C: 171.5 ml LV vol s, MOD A2C: 55.5 ml LV vol s, MOD A4C: 76.7 ml LV SV MOD A2C:     125.0 ml LV SV MOD A4C:     171.5 ml LV SV MOD BP:      107.7 ml RIGHT VENTRICLE RV S prime:     15.10 cm/s TAPSE (M-mode): 2.0 cm LEFT ATRIUM             Index       RIGHT ATRIUM          Index LA diam:        3.70 cm 1.51 cm/m  RA Area:     5.59 cm LA Vol (A2C):   32.9 ml 13.41 ml/m RA Volume:   8.80 ml  3.59 ml/m LA Vol (A4C):   24.6 ml 10.03 ml/m LA Biplane Vol: 29.5 ml 12.03 ml/m  AORTIC VALVE LVOT Vmax:   125.00 cm/s LVOT  Vmean:  85.900 cm/s LVOT VTI:    0.227 m  AORTA Ao Root diam: 3.10 cm MITRAL VALVE MV Area (PHT): 4.15 cm     SHUNTS MV Decel Time: 183 msec     Systemic VTI:  0.23 m MV E velocity: 117.00 cm/s  Systemic Diam: 2.20 cm MV A velocity: 79.30 cm/s MV E/A ratio:  1.48 Chilton Si MD Electronically signed by Chilton Si MD Signature Date/Time: 10/08/2020/1:48:31 PM    Final     Scheduled Meds: . buprenorphine-naloxone  1 tablet Sublingual BID  . Chlorhexidine Gluconate Cloth  6 each Topical Q0600  . cloNIDine  0.1 mg Oral BH-qamhs   Followed by  . [START ON 10/11/2020] cloNIDine  0.1 mg Oral QAC breakfast  . mupirocin ointment  1 application Nasal BID  . nicotine  14 mg Transdermal Daily  . sodium chloride flush  3 mL Intravenous Q12H    Continuous Infusions: . ceFEPime (MAXIPIME) IV 2 g (10/09/20 0509)  . lactated ringers 100 mL/hr at 10/09/20 0230     LOS: 2 days     Darlin Drop, MD [facility] Pager [phone]  If 7PM-7AM, please contact night-coverage [email or web address] Password [facility] 10/09/2020, 11:05 AM

## 2020-10-09 NOTE — Anesthesia Procedure Notes (Signed)
Arterial Line Insertion Start/End4/26/2022 12:50 PM, 10/09/2020 1:09 PM Performed by: Adair Laundry, CRNA, CRNA  Preanesthetic checklist: patient identified, IV checked, risks and benefits discussed, surgical consent, monitors and equipment checked and timeout performed Lidocaine 1% used for infiltration and patient sedated Left, radial was placed Catheter size: 20 G Hand hygiene performed  and maximum sterile barriers used  Allen's test indicative of satisfactory collateral circulation Attempts: 1 Procedure performed without using ultrasound guided technique. Following insertion, dressing applied and Biopatch. Post procedure assessment: normal  Patient tolerated the procedure well with no immediate complications.

## 2020-10-09 NOTE — Plan of Care (Signed)

## 2020-10-09 NOTE — Brief Op Note (Signed)
10/09/2020  4:48 PM  PATIENT:  Derek Woods  30 y.o. male  PRE-OPERATIVE DIAGNOSIS:  right empyema  POST-OPERATIVE DIAGNOSIS:  right empyema  PROCEDURE:  Procedure(s): Right thoracotomy and drainage of empyema  SURGEON:  Surgeon(s) and Role:    * Orlin Kann, Payton Doughty, MD - Primary  PHYSICIAN ASSISTANT: Jillyn Hidden PA-C, Erin Barrett, PA-C   ANESTHESIA:   general  EBL:  minimal   BLOOD ADMINISTERED:none  DRAINS: 2 51F Chest Tube(s) in the empyema cavity   LOCAL MEDICATIONS USED:  NONE  SPECIMEN:  Source of Specimen:  pus in empyema and pleural peel from empyema  DISPOSITION OF SPECIMEN:  micro  COUNTS:  YES  TOURNIQUET:  * No tourniquets in log *  DICTATION: .Note written in EPIC  PLAN OF CARE: Admit to inpatient   PATIENT DISPOSITION:  ICU - intubated and hemodynamically stable.   Delay start of Pharmacological VTE agent (>24hrs) due to surgical blood loss or risk of bleeding: yes

## 2020-10-09 NOTE — Progress Notes (Addendum)
NAME:  Derek Woods, MRN:  [id number], DOB:  [birth year], LOS: 2 ADMISSION DATE:  10/07/2020, CONSULTATION DATE:  10/09/2020 REFERRING MD:  Dr. Dow Adolph, Reason for Consult:  Ventilator Management  History of Present Illness:  Derek Woods is a 30 y.o. M, with a pertinent pmx of daily heroin IVDA (previous suboxone use), asthma, morbid obesity (BMI 52), 1 PPD smoking, possible OSA, who is currently in the [facility] ICU in critical condition.   Per chart review, he presented to [facility] on 4/24 with complaints of back pain, right flank pain, and one week with shortness of breath . CT of his chest revealed a large right empyema. He was admitted to [facility] by the hospitalist team.  He was taken to the OR on 4/26 with Dr. Laneta Simmers for a VATS and drainage of right empyema, however a right thoracotomy had to be prefomed. He was a difficult intubation for the procedure and he was unable to be weaned from the ventilator post operatively. There was reported difficulty with oxygenation during the procedure.   PCCM was consulted for ventilator management.  Pertinent  Medical History  Asthma, aspiration PNA (06/2020), Anxiety, Morbid obesity, Polysubstance abuse, tobacco abuse  Significant Hospital Events: Including procedures, antibiotic start and stop dates in addition to other pertinent events   . 4/24 Admit. CT chest> Rt empyema. CTTS consult. Cefepime started. +MRSA Nasal, BC> . ECHO> LVEF 60 to 65%, RVSF WNL . 4/26 VATS with R CT placement. Difficult intubation due to large tongue. Unable to wean vent after OR. ETT> CVC> R CT>, Vanc started  Interim History / Subjective:  Return from of OR  Remains intubated/sedated requiring 10 peep and 80 FiO2 upon exam.  40 prop, 1mg  dex, NE  Unable to obtain subjective evaluation due to patient status  Objective   Blood pressure 135/89, pulse (!) 106, temperature 98.4 F (36.9 C), temperature source Oral, resp. rate (!) 21, height 5\' 6"  (1.676 m), weight (!)  146.7 kg, SpO2 93 %.        Intake/Output Summary (Last 24 hours) at 10/09/2020 1716 Last data filed at 10/09/2020 1656 Gross per 24 hour  Intake 3488.44 ml  Output 4425 ml  Net -936.56 ml   Filed Weights   10/07/20 1006  Weight: (!) 146.7 kg    Examination: General: : obese, appears comfortable, in bed  HEENT: MM pink/moist, ianicteric, ETT, RT IJ cordis C/D/I  Neuro: Sedated, RASS -4, PERRL 71mm CV: S1S2, NSR, no m/r/g appreciated PULM:  Coarse in the upper lobes, diminished in the lower lobes, air movement throught, chest expansion symmetric, RT CT to -20 minimal serosang drainage tidaling GI: soft, bsx4 hypoactive, non distended   Renal: Foley in place, yellow urine, no sediment Extremities: warm/dry, no aprpeciated edema, capillary refill less than 3 seconds  Skin: Rt thoracotomy site   Labs/imaging that I havepersonally reviewed  (right click and "Reselect all SmartList Selections" daily)  ABG, CXR, iStat  Resolved Hospital Problem list     Assessment & Plan:  Acute Respiratory Failure with Hypercarbia Requiring Mechanical Ventilation Right Empyema S/P right thoracotomy 4/26 Morbid Obesity Difficult Airway (Grade 3 in OR) Suspected OSA/OHS ABG shows respiratory acidosis, suspect chronic component given bicarb 30. CXR imaging as expected post thoracotomy. Admit CRP 16.4, procal .47 -LTVV strategy with tidal volumes of 4-8 cc/kg ideal body weight -Goal plateau pressures of 30 and driving pressures of 15 -Wean PEEP/FiO2 for SpO2 greater than 92 -AM CXR. Follow intermittent CXR and ABG PRN -VAP  prevention with pulmonary hygiene and PPI -CT management per CTTS -Plan to wean ventilator overnight and attempt to extubate in AM if tolerated when more airway resources are readily available. -PAD bundle with precedex gtt and fentanyl gtt. Goal RASS -2. Titrate meds to goal. Suspected difficulty with sedation. Consider ketamine gtt. -Follow up AFB, Fungal, Gram stain, and  BC -Continue Vanc/Cefepime  Hypotension, unclear etiology ?sedation vs acute blood loss anemia vs sepsis -Continue levophed. Goal MAP greater than 65, titrate to goal -Follow up post op cbc and BMET  Hx Poly Substance Abuse -PAD bundle as discussed -Continue scheduled APAP and starting scheduled 10mg  oxy q4h.  -Continue PRN robaxin, PRN hydroxizine, D/C Toradol   Normocytic Anemia -Follow up post op cbc -Transfuse PRBC if HBG less than 7 -Obtain AM CBC to trend H&H  Hx Tobacco dependence -Smoking cessation education when appropriate  Best practice (right click and "Reselect all SmartList Selections" daily)  Diet:  NPO Pain/Anxiety/Delirium protocol (if indicated): Yes (RASS goal -2) VAP protocol (if indicated): Yes DVT prophylaxis: Subcutaneous Heparin and LMWH GI prophylaxis: PPI Glucose control:  SSI No Central venous access:  Yes, and it is still needed Arterial line:  Yes, and it is still needed Foley:  Yes, and it is still needed Mobility:  bed rest  PT consulted: N/A Last date of multidisciplinary goals of care discussion [Per primary] Code Status:  full code Disposition: ICU  Labs   CBC: Recent Labs  Lab 10/07/20 1101 10/08/20 0132 10/09/20 1536 10/09/20 1616  WBC 7.3 7.5  --   --   NEUTROABS 5.6  --   --   --   HGB 11.0* 10.9* 9.9* 10.2*  HCT 33.6* 34.2* 29.0* 30.0*  MCV 91.8 93.2  --   --   PLT 358 349  --   --     Basic Metabolic Panel: Recent Labs  Lab 10/07/20 1101 10/08/20 0132 10/09/20 1536 10/09/20 1616  NA 134* 133* 138 135  K 4.2 4.6 4.5 5.8*  CL 99 98  --   --   CO2 25 27  --   --   GLUCOSE 103* 112*  --   --   BUN 10 11  --   --   CREATININE 0.76 0.78  --   --   CALCIUM 9.1 8.9  --   --    GFR: Estimated Creatinine Clearance: 183.6 mL/min (by C-G formula based on SCr of 0.78 mg/dL). Recent Labs  Lab 10/07/20 1101 10/07/20 1412 10/08/20 0132  PROCALCITON 0.47  --   --   WBC 7.3  --  7.5  LATICACIDVEN 1.1 1.7  --      Liver Function Tests: Recent Labs  Lab 10/07/20 1101  AST 29  ALT 17  ALKPHOS 128*  BILITOT 0.6  PROT 8.0  ALBUMIN 2.4*   No results for input(s): LIPASE, AMYLASE in the last 168 hours. No results for input(s): AMMONIA in the last 168 hours.  ABG    Component Value Date/Time   PHART 7.357 10/09/2020 1616   PCO2ART 53.9 (H) 10/09/2020 1616   PO2ART 217 (H) 10/09/2020 1616   HCO3 30.4 (H) 10/09/2020 1616   TCO2 32 10/09/2020 1616   O2SAT 100.0 10/09/2020 1616     Coagulation Profile: No results for input(s): INR, PROTIME in the last 168 hours.  Cardiac Enzymes: No results for input(s): CKTOTAL, CKMB, CKMBINDEX, TROPONINI in the last 168 hours.  HbA1C: No results found for: HGBA1C  CBG: No results for  input(s): GLUCAP in the last 168 hours.  Review of Systems:   Unable to obtain due to patient status  Past Medical History:  He,  has a past medical history of Anxiety, Asthma, Morbid obesity with BMI of 50.0-59.9, adult (HCC), Polysubstance dependence including opioid type drug with complication, episodic abuse (HCC), and Tobacco dependence.   Surgical History:  History reviewed. No pertinent surgical history.   Social History:   reports that he has been smoking cigarettes. He has a 11.00 pack-year smoking history. He uses smokeless tobacco. He reports current alcohol use. He reports current drug use. Drugs: Heroin and Marijuana.   Family History:  His family history is not on file.   Allergies Allergies  Allergen Reactions  . Augmentin [Amoxicillin-Pot Clavulanate] Other (See Comments)    Pt does not recall, childhood  . Ceclor [Cefaclor] Other (See Comments)    Pt does not recall, childhood  . Sulfa Antibiotics Hives     Home Medications  Prior to Admission medications   Medication Sig Start Date End Date Taking? Authorizing Provider  acetaminophen (TYLENOL) 500 MG tablet Take 500 mg by mouth as needed (pain).   Yes [provider]   ibuprofen (ADVIL) 200 MG tablet Take 600-800 mg by mouth as needed (pain).   Yes [provider]  Melatonin 10 MG TABS Take 10 mg by mouth at bedtime as needed (sleep).   Yes [provider]     Critical care time: 32 minutes    Derek Woods, Jr., MSN, APRN, AGACNP-BC  Pulmonary & Critical Care  10/09/2020 , 6:34 PM  Please see [email or web address] for pager details  If no response, please call [phone] After hours, please call Elink at [phone]   Pulmonary critical care attending:  This is a 30 year old past medical history of daily IV drug abuse, heroin, previously on Suboxone, morbid obesity BMI 52, cigarette abuse, tobacco abuse, likely OSA.  Patient was admitted to the hospital with right-sided empyema taken to the operating room by Dr. Laneta SimmersBartle from thoracic surgery for drainage of the right empyema and right thoracotomy.  Patient remained hypotensive in PACU and intubated on mechanical life support.  Patient was transferred to intensive care unit and pulmonary critical care was consulted.  BP 107/62   Pulse 79   Temp (!) 96.8 F (36 C) (Axillary)   Resp (!) 22   Ht 5\' 6"  (1.676 m)   Wt (!) 146.7 kg   SpO2 100%   BMI 52.20 kg/m   General: Young male obese, intubated mechanical life support HEENT: NCAT, endotracheal tube in place Heart: Regular rate rhythm S1-S2 Lungs: Bilateral mechanically ventilated breath sounds Abdomen: Obese distended Neuro: Sedated on mechanical support  Labs: Reviewed Chest x-ray reviewed, right-sided infiltrate, pleural thickening chest tube in place, loculated effusion. The patient's images have been independently reviewed by me.    Assessment: Acute hypoxemic respiratory failure requiring intubation mechanical ventilation postoperative from a right thoracotomy for the treatment of a right-sided empyema Sepsis secondary to above Morbid obesity Difficult airway, patient had difficulty being intubated prior  to procedure with anesthesia Suspected severe OSA and obstructive sleep apnea at baseline History polysubstance abuse Tobacco abuse  Plan: Continue PAD guidelines sedation Will likely need high-dose opiate Continue Fentanyl plus Precedex Schedule oral oxycodone I have discontinued the Toradol, would like to avoid Toradol/NSAID use postop and sepsis scenario. Decreased risk of renal injury. Still remains on high vent settings, will wean PEEP and FiO2 as tolerated Adult mechanical vent protocol.  This patient is critically ill with multiple organ system failure; which, requires frequent high complexity decision making, assessment, support, evaluation, and titration of therapies. This was completed through the application of advanced monitoring technologies and extensive interpretation of multiple databases. During this encounter critical care time was devoted to patient care services described in this note for 36 minutes.  Josephine Igo, DO [place] [facility] 10/09/2020 7:30 PM

## 2020-10-10 ENCOUNTER — Inpatient Hospital Stay (HOSPITAL_COMMUNITY): Payer: Self-pay

## 2020-10-10 ENCOUNTER — Encounter (HOSPITAL_COMMUNITY): Payer: Self-pay | Admitting: Surgery

## 2020-10-10 ENCOUNTER — Inpatient Hospital Stay: Payer: Self-pay

## 2020-10-10 DIAGNOSIS — F1920 Other psychoactive substance dependence, uncomplicated: Secondary | ICD-10-CM

## 2020-10-10 LAB — CBC
HCT: 30.2 % — ABNORMAL LOW (ref 39.0–52.0)
Hemoglobin: 9.6 g/dL — ABNORMAL LOW (ref 13.0–17.0)
MCH: 29.5 pg (ref 26.0–34.0)
MCHC: 31.8 g/dL (ref 30.0–36.0)
MCV: 92.9 fL (ref 80.0–100.0)
Platelets: 303 10*3/uL (ref 150–400)
RBC: 3.25 MIL/uL — ABNORMAL LOW (ref 4.22–5.81)
RDW: 12.9 % (ref 11.5–15.5)
WBC: 7.7 10*3/uL (ref 4.0–10.5)
nRBC: 0 % (ref 0.0–0.2)

## 2020-10-10 LAB — POCT I-STAT 7, (LYTES, BLD GAS, ICA,H+H)
Acid-Base Excess: 3 mmol/L — ABNORMAL HIGH (ref 0.0–2.0)
Bicarbonate: 28.7 mmol/L — ABNORMAL HIGH (ref 20.0–28.0)
Calcium, Ion: 1.26 mmol/L (ref 1.15–1.40)
HCT: 28 % — ABNORMAL LOW (ref 39.0–52.0)
Hemoglobin: 9.5 g/dL — ABNORMAL LOW (ref 13.0–17.0)
O2 Saturation: 99 %
Patient temperature: 98.2
Potassium: 4.6 mmol/L (ref 3.5–5.1)
Sodium: 138 mmol/L (ref 135–145)
TCO2: 30 mmol/L (ref 22–32)
pCO2 arterial: 47.7 mmHg (ref 32.0–48.0)
pH, Arterial: 7.386 (ref 7.350–7.450)
pO2, Arterial: 136 mmHg — ABNORMAL HIGH (ref 83.0–108.0)

## 2020-10-10 LAB — BASIC METABOLIC PANEL
Anion gap: 5 (ref 5–15)
BUN: 14 mg/dL (ref 6–20)
CO2: 28 mmol/L (ref 22–32)
Calcium: 9 mg/dL (ref 8.9–10.3)
Chloride: 101 mmol/L (ref 98–111)
Creatinine, Ser: 0.81 mg/dL (ref 0.61–1.24)
GFR, Estimated: >60 mL/min (ref >60)
Glucose, Bld: 279 mg/dL — ABNORMAL HIGH (ref 70–99)
Potassium: 5.1 mmol/L (ref 3.5–5.1)
Sodium: 134 mmol/L — ABNORMAL LOW (ref 135–145)

## 2020-10-10 LAB — GLUCOSE, CAPILLARY
Glucose-Capillary: 54 mg/dL — ABNORMAL LOW (ref 70–99)
Glucose-Capillary: 176 mg/dL — ABNORMAL HIGH (ref 70–99)
Glucose-Capillary: 115 mg/dL — ABNORMAL HIGH (ref 70–99)
Glucose-Capillary: 121 mg/dL — ABNORMAL HIGH (ref 70–99)

## 2020-10-10 LAB — MAGNESIUM: Magnesium: 1.9 mg/dL (ref 1.7–2.4)

## 2020-10-10 IMAGING — DX DG CHEST 1V PORT
1 series · 1 of 1 positions shown · non-contrast
Comparison: [DATE].

CLINICAL DATA: Intubation.

EXAM:
PORTABLE CHEST 1 VIEW

[chest]
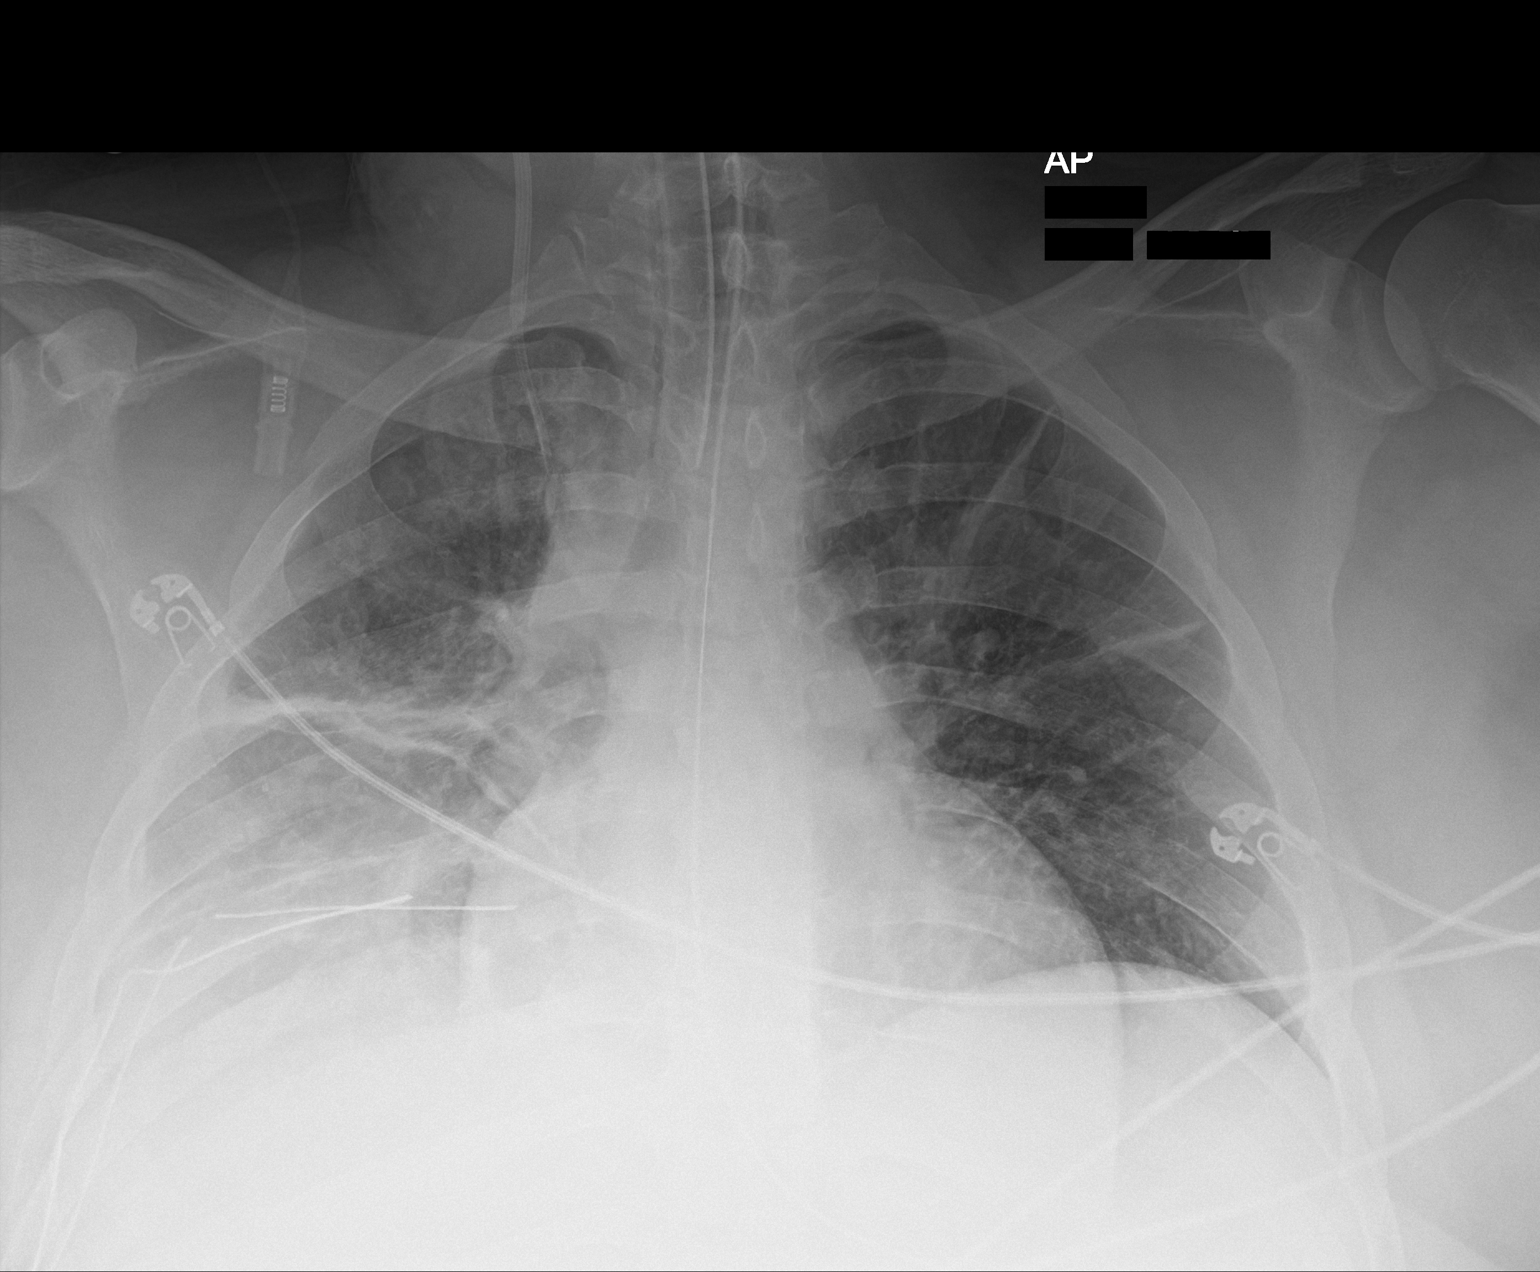

[1 of 1 positions shown; findings below may reference images not displayed]

FINDINGS: Interim placement of NG tube, its tip is below left hemidiaphragm.
Endotracheal tube and right IJ line stable position. Two right chest
tubes in stable position. Heart size stable. Persistent but improved
bilateral subsegmental atelectasis. Improved right-sided pleural
effusion. No pneumothorax.
IMPRESSION: 1. Interim placement of NG tube, its tip is below the left
hemidiaphragm. Remaining lines and tubes including 2 right chest
tubes in stable position. No pneumothorax.

2. Persistent but improved bilateral subsegmental atelectasis.
Improved right-sided pleural effusion.

## 2020-10-10 MED ORDER — ORAL CARE MOUTH RINSE
15.0000 mL | Freq: Two times a day (BID) | OROMUCOSAL | Status: DC
  Administered 2020-10-11 – 2020-11-23 (×70): 15 mL via OROMUCOSAL

## 2020-10-10 MED ORDER — KETOROLAC TROMETHAMINE 15 MG/ML IJ SOLN
15.0000 mg | Freq: Four times a day (QID) | INTRAMUSCULAR | Status: DC | PRN
  Administered 2020-10-10 – 2020-10-11 (×5): 15 mg via INTRAVENOUS
  Filled 2020-10-10 (×5): qty 1

## 2020-10-10 MED ORDER — SODIUM CHLORIDE 0.9 % IV SOLN
INTRAVENOUS | Status: DC | PRN
  Administered 2020-10-15: 250 mL via INTRAVENOUS
  Administered 2020-11-15 – 2020-11-16 (×2): 1000 mL via INTRAVENOUS

## 2020-10-10 MED ORDER — SODIUM CHLORIDE 0.9% FLUSH
10.0000 mL | Freq: Two times a day (BID) | INTRAVENOUS | Status: DC
  Administered 2020-10-10 – 2020-10-23 (×26): 10 mL
  Administered 2020-10-23: 20 mL
  Administered 2020-10-24 – 2020-10-28 (×7): 10 mL
  Administered 2020-10-28: 20 mL
  Administered 2020-10-29 – 2020-11-01 (×7): 10 mL
  Administered 2020-11-01: 30 mL
  Administered 2020-11-02 – 2020-11-08 (×14): 10 mL
  Administered 2020-11-08: 20 mL
  Administered 2020-11-09 – 2020-11-12 (×7): 10 mL
  Administered 2020-11-13: 20 mL
  Administered 2020-11-13 – 2020-11-18 (×8): 10 mL
  Administered 2020-11-19: 20 mL
  Administered 2020-11-19: 10 mL
  Administered 2020-11-20: 20 mL
  Administered 2020-11-20 – 2020-11-25 (×10): 10 mL
  Administered 2020-11-25: 20 mL
  Administered 2020-11-26 (×2): 10 mL

## 2020-10-10 MED ORDER — DEXTROSE 50 % IV SOLN
INTRAVENOUS | Status: AC
  Administered 2020-10-10: 50 mL
  Filled 2020-10-10: qty 50

## 2020-10-10 MED ORDER — MELATONIN 3 MG PO TABS
3.0000 mg | ORAL_TABLET | Freq: Every evening | ORAL | Status: DC | PRN
  Administered 2020-10-10 – 2020-11-26 (×42): 3 mg via ORAL
  Filled 2020-10-10 (×42): qty 1

## 2020-10-10 MED ORDER — SODIUM CHLORIDE 0.9% FLUSH
10.0000 mL | INTRAVENOUS | Status: DC | PRN
  Administered 2020-10-19 – 2020-11-15 (×3): 10 mL

## 2020-10-10 MED ORDER — PHENOL 1.4 % MT LIQD
1.0000 | OROMUCOSAL | Status: DC | PRN
  Administered 2020-10-10: 1 via OROMUCOSAL
  Filled 2020-10-10: qty 177

## 2020-10-10 NOTE — Progress Notes (Signed)
eLink Physician-Brief Progress Note Patient Name: Derek Woods DOB: [birth year] MRN: [id number]   Date of Service  10/10/2020  HPI/Events of Note  Pt requesting something for sleep. States he takes melatonin or benadryl at home  eICU Interventions  Melatonin ordered     Intervention Category Intermediate Interventions: Other:  Ranee Gosselin 10/10/2020, 9:32 PM

## 2020-10-10 NOTE — Progress Notes (Signed)
eLink Physician-Brief Progress Note Patient Name: Derek Woods DOB: 03/18/1991 MRN: [id number]   Date of Service  10/10/2020  HPI/Events of Note  Pain in his throat from OG tube. Bilious from OG. So in place. Got fenta boluses.  eICU Interventions  - chlore septic spray to throat for now. Look for thrush, by AM team.      Intervention Category Intermediate Interventions: Other:  Ranee Gosselin 10/10/2020, 5:35 AM

## 2020-10-10 NOTE — Plan of Care (Signed)

## 2020-10-10 NOTE — Progress Notes (Signed)
NAME:  Derek Woods, MRN:  [id number], DOB:  11-24-1990, LOS: 3 ADMISSION DATE:  10/07/2020, CONSULTATION DATE:  10/09/2020 REFERRING MD:  Dr. Dow Adolph, Reason for Consult:  Ventilator Management  History of Present Illness:  Derek Woods is a 30 y.o. M, with a pertinent pmx of daily heroin IVDA (previous suboxone use), asthma, morbid obesity (BMI 52), 1 PPD smoking, possible OSA, who is currently in the [facility] ICU in critical condition.   Per chart review, he presented to [facility] on 4/24 with complaints of back pain, right flank pain, and one week with shortness of breath . CT of his chest revealed a large right empyema. He was admitted to [facility] by the hospitalist team.  He was taken to the OR on 4/26 with Dr. Laneta Simmers for a VATS and drainage of right empyema, however a right thoracotomy had to be prefomed. He was a difficult intubation for the procedure and he was unable to be weaned from the ventilator post operatively. There was reported difficulty with oxygenation during the procedure.   PCCM was consulted for ventilator management.  Pertinent  Medical History  Asthma, aspiration PNA (06/2020), Anxiety, Morbid obesity, Polysubstance abuse, tobacco abuse  Significant Hospital Events: Including procedures, antibiotic start and stop dates in addition to other pertinent events   . 4/24 Admit. CT chest> Rt empyema. CTTS consult. Cefepime started. +MRSA Nasal, BC> . ECHO> LVEF 60 to 65%, RVSF WNL . 4/26 VATS with R CT placement. Difficult intubation due to large tongue. Unable to wean vent after OR. ETT> CVC> R CT>, Vanc started  Interim History / Subjective:   Tolerating SAT SBT this morning.  Following commands.  Likely able to liberate  Objective   Blood pressure (!) 102/54, pulse 73, temperature 98.8 F (37.1 C), temperature source Oral, resp. rate (!) 27, height 5\' 6"  (1.676 m), weight (!) 146.7 kg, SpO2 (!) 89 %.    Vent Mode: PSV;CPAP FiO2 (%):  [40 %-100 %] 40 % Set Rate:  [15 bmp-20  bmp] 20 bmp Vt Set:  [510 mL] 510 mL PEEP:  [5 cmH20-10 cmH20] 5 cmH20 Pressure Support:  [10 cmH20] 10 cmH20 Plateau Pressure:  [14 cmH20-28 cmH20] 14 cmH20   Intake/Output Summary (Last 24 hours) at 10/10/2020 1144 Last data filed at 10/10/2020 1000 Gross per 24 hour  Intake 6790.1 ml  Output 3807 ml  Net 2983.1 ml   Filed Weights   10/07/20 1006  Weight: (!) 146.7 kg    Examination: General: : Obese male intubated mechanical life support HEENT: Mucous membranes moist, endotracheal tube in place right internal jugular catheter Neuro: Alert following commands no focal deficit CV: Regular rate rhythm, S1-S2 PULM: CTAB, no wheeze, BL vented breaths GI: soft, nt nd Extremities: no edema 0 Skin: right thoracotomy    Labs/imaging that I havepersonally reviewed  (right click and "Reselect all SmartList Selections" daily)  ABG, CXR, iStat  Resolved Hospital Problem list     Assessment & Plan:   Acute Respiratory Failure with Hypercarbia Requiring Mechanical Ventilation Right Empyema S/P right thoracotomy 4/26 Morbid Obesity Difficult Airway (Grade 3 in OR) Suspected OSA/OHS Plan:  SBT SAT, plan for liberation from mechanical support Hold sedation Chest tube management per T CTS Follow-up cultures Continue cefepime  Hypotension, unclear etiology, likely related to sedation sedation vs acute blood loss anemia vs sepsis Plan:  Resolved  Hx Poly Substance Abuse As needed oxycodone As needed Robaxin Likely will need as needed fentanyl for pain control  Normocytic Anemia Plan:  Conservative transfusion threshold for hemoglobin less than 7  Hx Tobacco dependence -We will need substance abuse and smoking cessation counseling  Best practice (right click and "Reselect all SmartList Selections" daily)  Diet:  Oral Pain/Anxiety/Delirium protocol (if indicated): No VAP protocol (if indicated): Yes DVT prophylaxis: Subcutaneous Heparin and LMWH GI prophylaxis:  PPI Glucose control:  SSI No Central venous access:  Yes, and it is still needed Arterial line:  Yes, and it is still needed Foley:  Yes, and it is still needed Mobility:  bed rest  PT consulted: N/A Last date of multidisciplinary goals of care discussion [Per primary] Code Status:  full code Disposition: ICU  Labs   CBC: Recent Labs  Lab 10/07/20 1101 10/08/20 0132 10/09/20 1536 10/09/20 1616 10/09/20 1751 10/09/20 2021 10/10/20 0422 10/10/20 0458  WBC 7.3 7.5  --   --   --  8.2 7.7  --   NEUTROABS 5.6  --   --   --   --   --   --   --   HGB 11.0* 10.9*   < > 10.2* 10.2* 9.7* 9.6* 9.5*  HCT 33.6* 34.2*   < > 30.0* 30.0* 30.5* 30.2* 28.0*  MCV 91.8 93.2  --   --   --  92.7 92.9  --   PLT 358 349  --   --   --  324 303  --    < > = values in this interval not displayed.    Basic Metabolic Panel: Recent Labs  Lab 10/07/20 1101 10/08/20 0132 10/09/20 1536 10/09/20 1616 10/09/20 1751 10/09/20 2021 10/10/20 0422 10/10/20 0458  NA 134* 133*   < > 135 135 135 134* 138  K 4.2 4.6   < > 5.8* 5.5* 5.0 5.1 4.6  CL 99 98  --   --   --  100 101  --   CO2 25 27  --   --   --  28 28  --   GLUCOSE 103* 112*  --   --   --  138* 279*  --   BUN 10 11  --   --   --  13 14  --   CREATININE 0.76 0.78  --   --   --  0.87 0.81  --   CALCIUM 9.1 8.9  --   --   --  8.9 9.0  --   MG  --   --   --   --   --   --  1.9  --    < > = values in this interval not displayed.   GFR: Estimated Creatinine Clearance: 181.3 mL/min (by C-G formula based on SCr of 0.81 mg/dL). Recent Labs  Lab 10/07/20 1101 10/07/20 1412 10/08/20 0132 10/09/20 2021 10/10/20 0422  PROCALCITON 0.47  --   --   --   --   WBC 7.3  --  7.5 8.2 7.7  LATICACIDVEN 1.1 1.7  --   --   --     Liver Function Tests: Recent Labs  Lab 10/07/20 1101  AST 29  ALT 17  ALKPHOS 128*  BILITOT 0.6  PROT 8.0  ALBUMIN 2.4*   No results for input(s): LIPASE, AMYLASE in the last 168 hours. No results for input(s): AMMONIA  in the last 168 hours.  ABG    Component Value Date/Time   PHART 7.386 10/10/2020 0458   PCO2ART 47.7 10/10/2020 0458   PO2ART 136 (H) 10/10/2020 0458   HCO3  28.7 (H) 10/10/2020 0458   TCO2 30 10/10/2020 0458   O2SAT 99.0 10/10/2020 0458     Coagulation Profile: No results for input(s): INR, PROTIME in the last 168 hours.  Cardiac Enzymes: No results for input(s): CKTOTAL, CKMB, CKMBINDEX, TROPONINI in the last 168 hours.  HbA1C: No results found for: HGBA1C  CBG: Recent Labs  Lab 10/09/20 2341 10/10/20 0407 10/10/20 0455 10/10/20 0817 10/10/20 1140  GLUCAP 115* 54* 176* 115* 121*    Review of Systems:   Unable to obtain due to patient status  Past Medical History:  He,  has a past medical history of Anxiety, Asthma, Morbid obesity with BMI of 50.0-59.9, adult (HCC), Polysubstance dependence including opioid type drug with complication, episodic abuse (HCC), and Tobacco dependence.   Surgical History:   Past Surgical History:  Procedure Laterality Date  . THORACOTOMY Right 10/09/2020   Procedure: RIGHT VATS/ THORACOTOMY/EMPYEMA;  Surgeon: [name], MD;  Location: [facility] OR;  Service: Thoracic;  Laterality: Right;     Social History:   reports that he has been smoking cigarettes. He has a 11.00 pack-year smoking history. He uses smokeless tobacco. He reports current alcohol use. He reports current drug use. Drugs: Heroin and Marijuana.   Family History:  His family history is not on file.   Allergies Allergies  Allergen Reactions  . Augmentin [Amoxicillin-Pot Clavulanate] Other (See Comments)    Pt does not recall, childhood  . Ceclor [Cefaclor] Other (See Comments)    Pt does not recall, childhood  . Sulfa Antibiotics Hives     Home Medications  Prior to Admission medications   Medication Sig Start Date End Date Taking? Authorizing Provider  acetaminophen (TYLENOL) 500 MG tablet Take 500 mg by mouth as needed (pain).   Yes [provider]   ibuprofen (ADVIL) 200 MG tablet Take 600-800 mg by mouth as needed (pain).   Yes [provider]  Melatonin 10 MG TABS Take 10 mg by mouth at bedtime as needed (sleep).   Yes [provider]     This patient is critically ill with multiple organ system failure; which, requires frequent high complexity decision making, assessment, support, evaluation, and titration of therapies. This was completed through the application of advanced monitoring technologies and extensive interpretation of multiple databases. During this encounter critical care time was devoted to patient care services described in this note for 32 minutes.  Josephine Igo, DO [place] [facility] 10/10/2020 11:45 AM

## 2020-10-10 NOTE — Progress Notes (Signed)
 bag of fentanyl wasted in the stericycle. Archie Patten Rn witnessed

## 2020-10-10 NOTE — Progress Notes (Signed)
TCTS BRIEF SICU PROGRESS NOTE  1 Day Post-Op  S/P Procedure(s) (LRB): RIGHT VATS/ THORACOTOMY/EMPYEMA (Right)   Stable day Expected soreness in chest Reports breathing improved O2 sats 91-97% on nasal cannula Chest tube output low, no air leak  Plan: Continue current plan  Purcell Nails, MD 10/10/2020 5:03 PM

## 2020-10-10 NOTE — Procedures (Signed)
Extubation Procedure Note  Patient Details:   Name: Derek Woods DOB: [birth year] MRN: [id number]   Airway Documentation:    Vent end date: 10/10/20 Vent end time: 0905   Evaluation  O2 sats: stable throughout Complications: No apparent complications Patient did tolerate procedure well. Bilateral Breath Sounds: Diminished,Rhonchi   Yes   Pt extubated to 6L [place]. Positive cuff leak noted, no stridor heard. Pt had no complications and vitals were stable. RT will continue to monitor.  [name] 10/10/2020, 9:08 AM

## 2020-10-10 NOTE — Social Work (Signed)
CSW received consult for substance use resources for patient. CSW will continue to follow and offer patient resources when appropriate.  

## 2020-10-10 NOTE — Progress Notes (Signed)
Hypoglycemic Event  CBG:  54 @ 0407  Treatment: D50 50 mL (25 gm)  Symptoms: None  Follow-up CBG: Time:  0455 CBG Result:  176  Possible Reasons for Event: Inadequate meal intake  Comments/MD notified:  n/a    Derek Woods

## 2020-10-10 NOTE — Progress Notes (Signed)
1 Day Post-Op Procedure(s) (LRB): RIGHT VATS/ THORACOTOMY/EMPYEMA (Right) Subjective:  Extubated today. Sore but breathing better.  Objective: Vital signs in last 24 hours: Temp:  [96 F (35.6 C)-99 F (37.2 C)] 99 F (37.2 C) (04/27 1533) Pulse Rate:  [53-81] 81 (04/27 1600) Cardiac Rhythm: Normal sinus rhythm (04/27 0900) Resp:  [18-39] 21 (04/27 1600) BP: (89-147)/(35-81) 111/71 (04/27 1600) SpO2:  [87 %-100 %] 97 % (04/27 1600) Arterial Line BP: (87-190)/(53-104) 135/75 (04/27 0630) FiO2 (%):  [40 %-80 %] 40 % (04/27 0842)  Hemodynamic parameters for last 24 hours:    Intake/Output from previous day: 04/26 0701 - 04/27 0700 In: 6038.1 [I.V.:4592.8; IV Piggyback:1445.3] Out: 4582 [Urine:3780; Emesis/NG output:550; Blood:50; Chest Tube:202] Intake/Output this shift: Total I/O In: 1364.1 [I.V.:1132.5; IV Piggyback:231.5] Out: 650 [Urine:650]  General appearance: alert and cooperative Neurologic: intact Heart: regular rate and rhythm Lungs: clear to auscultation bilaterally Wound: dressing dry chest tube output low, no air leak.  Lab Results: Recent Labs    10/09/20 2021 10/10/20 0422 10/10/20 0458  WBC 8.2 7.7  --   HGB 9.7* 9.6* 9.5*  HCT 30.5* 30.2* 28.0*  PLT 324 303  --    BMET:  Recent Labs    10/09/20 2021 10/10/20 0422 10/10/20 0458  NA 135 134* 138  K 5.0 5.1 4.6  CL 100 101  --   CO2 28 28  --   GLUCOSE 138* 279*  --   BUN 13 14  --   CREATININE 0.87 0.81  --   CALCIUM 8.9 9.0  --     PT/INR: No results for input(s): LABPROT, INR in the last 72 hours. ABG    Component Value Date/Time   PHART 7.386 10/10/2020 0458   HCO3 28.7 (H) 10/10/2020 0458   TCO2 30 10/10/2020 0458   O2SAT 99.0 10/10/2020 0458   CBG (last 3)  Recent Labs    10/10/20 0455 10/10/20 0817 10/10/20 1140  GLUCAP 176* 115* 121*   CLINICAL DATA:  Intubation.  EXAM: PORTABLE CHEST 1 VIEW  COMPARISON:  10/09/2020.  FINDINGS: Interim placement of NG  tube, its tip is below left hemidiaphragm. Endotracheal tube and right IJ line stable position. Two right chest tubes in stable position. Heart size stable. Persistent but improved bilateral subsegmental atelectasis. Improved right-sided pleural effusion. No pneumothorax.  IMPRESSION: 1. Interim placement of NG tube, its tip is below the left hemidiaphragm. Remaining lines and tubes including 2 right chest tubes in stable position. No pneumothorax.  2. Persistent but improved bilateral subsegmental atelectasis. Improved right-sided pleural effusion.   Electronically Signed   By: Maisie Fus  Register   On: 10/10/2020 06:35  Assessment/Plan: S/P Procedure(s) (LRB): RIGHT VATS/ THORACOTOMY/EMPYEMA (Right)  POD 1 Hemodynamically stable Extubated without incident Improved aeration on CXR Gram stain from pleural fluid and peel shows G+ cocci. Culture pending. Continue vanc and maxipime. Continue chest tubes. IS.   LOS: 3 days    [name] 10/10/2020

## 2020-10-10 NOTE — Progress Notes (Signed)
Peripherally Inserted Central Catheter Placement  The IV Nurse has discussed with the patient and/or persons authorized to consent for the patient, the purpose of this procedure and the potential benefits and risks involved with this procedure.  The benefits include less needle sticks, lab draws from the catheter, and the patient may be discharged home with the catheter. Risks include, but not limited to, infection, bleeding, blood clot (thrombus formation), and puncture of an artery; nerve damage and irregular heartbeat and possibility to perform a PICC exchange if needed/ordered by physician.  Alternatives to this procedure were also discussed.  Bard Power PICC patient education guide, fact sheet on infection prevention and patient information card has been provided to patient /or left at bedside.    PICC Placement Documentation  PICC Double Lumen 10/10/20 PICC Right Basilic 44 cm 0 cm (Active)  Indication for Insertion or Continuance of Line Prolonged intravenous therapies 10/10/20 1452  Exposed Catheter (cm) 0 cm 10/10/20 1452  Site Assessment Clean;Dry;Intact 10/10/20 1452  Lumen #1 Status Flushed;Blood return noted;Saline locked 10/10/20 1452  Lumen #2 Status Flushed;Blood return noted;Saline locked 10/10/20 1452  Dressing Type Transparent 10/10/20 1452  Dressing Status Clean;Intact;Dry 10/10/20 1452  Antimicrobial disc in place? Yes 10/10/20 1452  Dressing Change Due 10/17/20 10/10/20 1452       Audrie Gallus 10/10/2020, 2:54 PM

## 2020-10-11 ENCOUNTER — Inpatient Hospital Stay (HOSPITAL_COMMUNITY): Payer: Self-pay

## 2020-10-11 LAB — CBC
HCT: 28.7 % — ABNORMAL LOW (ref 39.0–52.0)
Hemoglobin: 9.1 g/dL — ABNORMAL LOW (ref 13.0–17.0)
MCH: 29.8 pg (ref 26.0–34.0)
MCHC: 31.7 g/dL (ref 30.0–36.0)
MCV: 94.1 fL (ref 80.0–100.0)
Platelets: 297 10*3/uL (ref 150–400)
RBC: 3.05 MIL/uL — ABNORMAL LOW (ref 4.22–5.81)
RDW: 14 % (ref 11.5–15.5)
WBC: 6.4 10*3/uL (ref 4.0–10.5)
nRBC: 0 % (ref 0.0–0.2)

## 2020-10-11 LAB — COMPREHENSIVE METABOLIC PANEL
ALT: 15 U/L (ref 0–44)
AST: 25 U/L (ref 15–41)
Albumin: 2.2 g/dL — ABNORMAL LOW (ref 3.5–5.0)
Alkaline Phosphatase: 92 U/L (ref 38–126)
Anion gap: 7 (ref 5–15)
BUN: 19 mg/dL (ref 6–20)
CO2: 29 mmol/L (ref 22–32)
Calcium: 8.8 mg/dL — ABNORMAL LOW (ref 8.9–10.3)
Chloride: 98 mmol/L (ref 98–111)
Creatinine, Ser: 0.9 mg/dL (ref 0.61–1.24)
GFR, Estimated: >60 mL/min (ref >60)
Glucose, Bld: 110 mg/dL — ABNORMAL HIGH (ref 70–99)
Potassium: 4.2 mmol/L (ref 3.5–5.1)
Sodium: 134 mmol/L — ABNORMAL LOW (ref 135–145)
Total Bilirubin: 0.5 mg/dL (ref 0.3–1.2)
Total Protein: 6.6 g/dL (ref 6.5–8.1)

## 2020-10-11 LAB — GLUCOSE, CAPILLARY
Glucose-Capillary: 140 mg/dL — ABNORMAL HIGH (ref 70–99)
Glucose-Capillary: 128 mg/dL — ABNORMAL HIGH (ref 70–99)

## 2020-10-11 LAB — ACID FAST SMEAR (AFB, MYCOBACTERIA)
Acid Fast Smear: NEGATIVE
Acid Fast Smear: NEGATIVE

## 2020-10-11 LAB — VANCOMYCIN, TROUGH: Vancomycin Tr: 30 ug/mL (ref 15–20)

## 2020-10-11 IMAGING — DX DG CHEST 1V PORT
1 series · 1 of 1 positions shown · non-contrast
Comparison: Chest x-ray [DATE], [DATE].

CLINICAL DATA: Empyema.

EXAM:
PORTABLE CHEST 1 VIEW

[chest ap]
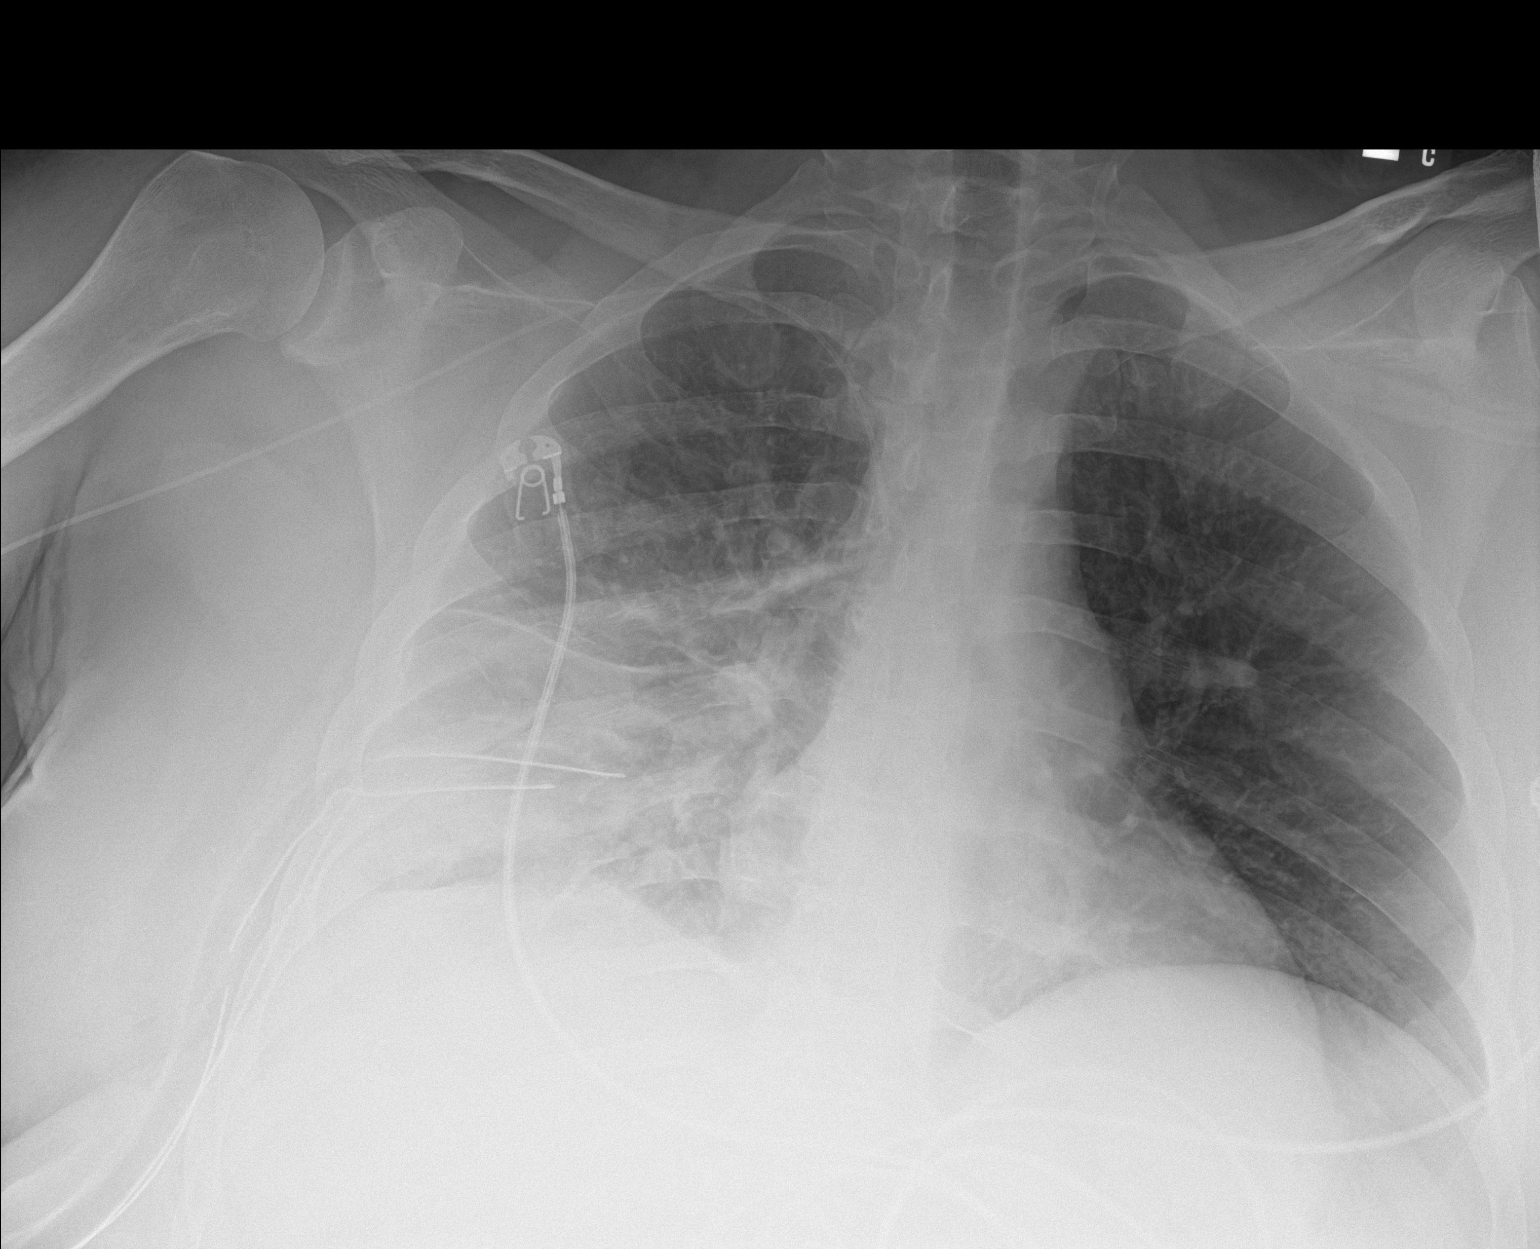

[1 of 1 positions shown; findings below may reference images not displayed]

FINDINGS: Interim extubation and removal of NG tube. Interim removal of right
IJ line. Interim placement right PICC line, tip over SVC. Two right
chest tubes again noted. Side hole of the lower chest tube may be
within the chest wall. No pneumothorax. Persistent right base
atelectasis/infiltrate and right-sided pleural effusion/empyema,
significantly improved from [DATE]. No pneumothorax. Heart size
stable.
IMPRESSION: 1. Interim extubation removal of NG tube. Interim removal of right
IJ line. Interim placement right PICC line, tip over SVC. Two right
chest tubes again noted. Side hole of the lower chest tube may be
within the chest wall. No pneumothorax.

2. Persistent right base atelectasis/infiltrate and right-sided
pleural effusion/empyema, significant improved from [DATE].

## 2020-10-11 MED ORDER — OXYCODONE HCL 5 MG PO TABS
10.0000 mg | ORAL_TABLET | ORAL | Status: AC | PRN
Start: 2020-10-11 — End: 2020-10-12
  Administered 2020-10-11 – 2020-10-12 (×3): 10 mg via ORAL
  Filled 2020-10-11 (×3): qty 2

## 2020-10-11 MED ORDER — KETOROLAC TROMETHAMINE 15 MG/ML IJ SOLN
30.0000 mg | Freq: Four times a day (QID) | INTRAMUSCULAR | Status: AC | PRN
  Administered 2020-10-12 – 2020-10-15 (×14): 30 mg via INTRAVENOUS
  Filled 2020-10-11 (×14): qty 2

## 2020-10-11 MED ORDER — VANCOMYCIN VARIABLE DOSE PER UNSTABLE RENAL FUNCTION (PHARMACIST DOSING): Status: DC

## 2020-10-11 NOTE — Progress Notes (Addendum)
Pharmacy Antibiotic Note  Derek Woods is a 30 y.o. male admitted on 10/07/2020 with loculated pleural effusion with empyema now s/p VATS and thoracotomy. Patient screened MRSA positive. Pharmacy has been consulted for vancomycin and cefepime dosing.  WBC wnl, afebrile. Renal function stable, Scr 0.9, CrCl 163 ml/min. Cultures growing staph aureus, sensitivities pending. Pharmacy asked to stop cefepime and continue vanc.  Vancomycin trough level supratherapeutic at 30. Confirmed with RN that level was drawn accurately. Last vancomycin dose given 8 hours prior to level. Will hold dose and recheck a vanc level in the AM.  Plan: Stop Cefepime Hold vancomycin Check vancomycin level in AM Monitor renal function, culture sensitivities, and future vanc levels.  Height: 5\' 6"  (167.6 cm) Weight: (!) 146.7 kg (323 lb 6.4 oz) IBW/kg (Calculated) : 63.8  Temp (24hrs), Avg:98.4 F (36.9 C), Min:98 F (36.7 C), Max:99 F (37.2 C)  Recent Labs  Lab 10/07/20 1101 10/07/20 1412 10/08/20 0132 10/09/20 2021 10/10/20 0422 10/11/20 0435 10/11/20 1218  WBC 7.3  --  7.5 8.2 7.7 6.4  --   CREATININE 0.76  --  0.78 0.87 0.81 0.90  --   LATICACIDVEN 1.1 1.7  --   --   --   --   --   VANCOTROUGH  --   --   --   --   --   --  30*    Estimated Creatinine Clearance: 163.2 mL/min (by C-G formula based on SCr of 0.9 mg/dL).    Allergies  Allergen Reactions  . Augmentin [Amoxicillin-Pot Clavulanate] Other (See Comments)    Pt does not recall, childhood  . Ceclor [Cefaclor] Other (See Comments)    Pt does not recall, childhood  . Sulfa Antibiotics Hives    Antimicrobials this admission: 4/24 vancomycin >> 4/24 cefepime >> 4/28  Dose adjustments this admission: 4/28 VT 30 - hold vancomycin  Microbiology results: 4/24 BCx: ngtd (collected prior to abx) 4/25 MRSA +  4/26 pleural tissue: staph aureus 4/26 pleural fluid: staph aureus   5/26, PharmD, BCPS PGY2 Cardiology Pharmacy  Resident Phone: [phone] 10/11/2020  2:00 PM  Please check [email or web address] for unit-specific pharmacy phone numbers.

## 2020-10-11 NOTE — Social Work (Signed)
CSW received consult for substance use resources. CSW spoke with patient at beside. CSW offered patient resources for outpatient substance use treatment services. Patient accepted. Patient requested resources for medicaid. CSW offered patient medicaid and disability resources. Patient accepted. CSW will continue to follow.

## 2020-10-11 NOTE — Plan of Care (Signed)

## 2020-10-11 NOTE — Progress Notes (Signed)
2 Days Post-Op Procedure(s) (LRB): RIGHT VATS/ THORACOTOMY/EMPYEMA (Right) Subjective: Complains of left chest/back pain but better than preop and breathing better.  Objective: Vital signs in last 24 hours: Temp:  [96 F (35.6 C)-99 F (37.2 C)] 98.4 F (36.9 C) (04/28 0000) Pulse Rate:  [65-106] 106 (04/28 0700) Cardiac Rhythm: Normal sinus rhythm (04/28 0400) Resp:  [19-38] 31 (04/28 0700) BP: (89-135)/(35-71) 135/64 (04/28 0700) SpO2:  [87 %-100 %] 98 % (04/28 0700) FiO2 (%):  [40 %] 40 % (04/27 0842)  Hemodynamic parameters for last 24 hours:    Intake/Output from previous day: 04/27 0701 - 04/28 0700 In: 2383.1 [I.V.:1295.7; IV Piggyback:1087.4] Out: 3695 [Urine:3605; Chest Tube:90] Intake/Output this shift: No intake/output data recorded.  General appearance: alert and cooperative Heart: regular rate and rhythm Lungs: diminished breath sounds right base Wound: dressing dry chest tube output low, serosanguinous and no air leak seen  Lab Results: Recent Labs    10/10/20 0422 10/10/20 0458 10/11/20 0435  WBC 7.7  --  6.4  HGB 9.6* 9.5* 9.1*  HCT 30.2* 28.0* 28.7*  PLT 303  --  297   BMET:  Recent Labs    10/10/20 0422 10/10/20 0458 10/11/20 0435  NA 134* 138 134*  K 5.1 4.6 4.2  CL 101  --  98  CO2 28  --  29  GLUCOSE 279*  --  110*  BUN 14  --  19  CREATININE 0.81  --  0.90  CALCIUM 9.0  --  8.8*    PT/INR: No results for input(s): LABPROT, INR in the last 72 hours. ABG    Component Value Date/Time   PHART 7.386 10/10/2020 0458   HCO3 28.7 (H) 10/10/2020 0458   TCO2 30 10/10/2020 0458   O2SAT 99.0 10/10/2020 0458   CBG (last 3)  Recent Labs    10/10/20 0455 10/10/20 0817 10/10/20 1140  GLUCAP 176* 115* 121*   Narrative & Impression  CLINICAL DATA:  Empyema.  EXAM: PORTABLE CHEST 1 VIEW  COMPARISON:  Chest x-ray 10/10/2020, 10/09/2020.  FINDINGS: Interim extubation and removal of NG tube. Interim removal of right IJ line.  Interim placement right PICC line, tip over SVC. Two right chest tubes again noted. Side hole of the lower chest tube may be within the chest wall. No pneumothorax. Persistent right base atelectasis/infiltrate and right-sided pleural effusion/empyema, significantly improved from 10/09/2020. No pneumothorax. Heart size stable.  IMPRESSION: 1. Interim extubation removal of NG tube. Interim removal of right IJ line. Interim placement right PICC line, tip over SVC. Two right chest tubes again noted. Side hole of the lower chest tube may be within the chest wall. No pneumothorax.  2. Persistent right base atelectasis/infiltrate and right-sided pleural effusion/empyema, significant improved from 10/09/2020.   Electronically Signed   By: Maisie Fus  Register   On: 10/11/2020 05:23     Assessment/Plan:  POD 2 Chest tube output low and CXR stable. Will put to water seal and repeat CXR in am. Continue vanc and maxipime pending culture results.  IS, OOB, mobilize.  He can go to [facility] or [facility] at medical teams discretion.   LOS: 4 days    [name] 10/11/2020

## 2020-10-11 NOTE — Progress Notes (Signed)
Date and time results received: 10/11/20 1340  Test: [facility] Critical Value: 30  Name of Provider Notified: Janne Lab  Orders Received? Or Actions Taken?: SEE NEW ORDERS.

## 2020-10-11 NOTE — Progress Notes (Signed)
PROGRESS NOTE    Derek Woods  KVQ:[id number] DOB: 17-Feb-1991 DOA: 10/07/2020 PCP: Pcp, No   Brief Narrative:  Derek Woods is a 30 y.o. M, with a pertinent pmx of daily heroin IVDA (previous suboxone use), asthma, morbid obesity (BMI 52), 1 PPD smoking, possible OSA, presented to [facility] on 4/24 with complaints of back pain, right flank pain, and one week with shortness of breath . CT of his chest revealed a large right empyema. He was admitted to [facility] by the hospitalist team.  He was taken to the OR on 4/26 with Dr. Laneta Simmers for a VATS and drainage of right empyema, however a right thoracotomy had to be prefomed. He was a difficult intubation for the procedure and he was unable to be weaned from the ventilator post operatively. There was reported difficulty with oxygenation during the procedure. PCCM was consulted for ventilator management who assumed care as primary team.  Patient was extubated on 10/10/2020 and was transferred under [facility] care on 10/11/2020.  Assessment & Plan:   Principal Problem:   Empyema lung (HCC) Active Problems:   Polysubstance dependence including opioid type drug with complication, episodic abuse (HCC)   Morbid obesity with BMI of 50.0-59.9, adult (HCC)   Tobacco dependence  Acute respiratory failure with hypercarbia and hypoxia/right empyema s/p right thoracotomy on 10/09/2020/suspected OSA/OHS: Patient required intubation on 10/09/2020 and was extubated on 10/10/2020.  S/p thoracotomy.  Currently has chest tube on the right side which is at waterseal.  Managed by CT surgery.  Cultures growing gram-positive cocci, sensitivities pending.  Blood culture negative.  Continue cefepime and vancomycin.  Hypotension: Resolved.  Blood pressure better today.  Polysubstance abuse: Continue as needed oxycodone and Robaxin.  Normocytic anemia: Hemoglobin is stable over 9.  Watch daily.  Tobacco dependence: Will need lengthy counseling.  Has indwelling Foley catheter.  We will  remove that today.  DVT prophylaxis: SCD's Start: 10/09/20 1716   Code Status: Full Code  Family Communication: None present at bedside.  Plan of care discussed with patient in length and he verbalized understanding and agreed with it.  Status is: Inpatient  Remains inpatient appropriate because:Inpatient level of care appropriate due to severity of illness   Dispo: The patient is from: Home              Anticipated d/c is to: Home              Patient currently is not medically stable to d/c.   Difficult to place patient No        Estimated body mass index is 52.2 kg/m as calculated from the following:   Height as of this encounter: 5\' 6"  (1.676 m).   Weight as of this encounter: 146.7 kg.      Nutritional status:               Consultants:   CT surgery  Procedures:   Chest tube placement, thoracotomy and intubation  Antimicrobials:  Anti-infectives (From admission, onward)   Start     Dose/Rate Route Frequency Ordered Stop   10/10/20 0400  vancomycin (VANCOREADY) IVPB 1250 mg/250 mL        1,250 mg 166.7 mL/hr over 90 Minutes Intravenous Every 8 hours 10/09/20 1835     10/09/20 2000  vancomycin (VANCOREADY) IVPB 1250 mg/250 mL  Status:  Discontinued        1,250 mg 166.7 mL/hr over 90 Minutes Intravenous Every 8 hours 10/09/20 1117 10/09/20 1835   10/09/20 1900  vancomycin (VANCOCIN) 2,500 mg in sodium chloride 0.9 % 500 mL IVPB        2,500 mg 250 mL/hr over 120 Minutes Intravenous  Once 10/09/20 1835 10/09/20 2137   10/09/20 1200  vancomycin (VANCOCIN) 2,500 mg in sodium chloride 0.9 % 500 mL IVPB  Status:  Discontinued        2,500 mg 250 mL/hr over 120 Minutes Intravenous  Once 10/09/20 1113 10/09/20 1835   10/07/20 2200  vancomycin (VANCOREADY) IVPB 2000 mg/400 mL  Status:  Discontinued        2,000 mg 200 mL/hr over 120 Minutes Intravenous Every 12 hours 10/07/20 1251 10/07/20 1437   10/07/20 2000  ceFEPIme (MAXIPIME) 2 g in sodium chloride  0.9 % 100 mL IVPB        2 g 200 mL/hr over 30 Minutes Intravenous Every 8 hours 10/07/20 1251     10/07/20 1145  ceFEPIme (MAXIPIME) 2 g in sodium chloride 0.9 % 100 mL IVPB        2 g 200 mL/hr over 30 Minutes Intravenous  Once 10/07/20 1048 10/07/20 1351   10/07/20 1100  vancomycin (VANCOCIN) 2,500 mg in sodium chloride 0.9 % 500 mL IVPB  Status:  Discontinued        2,500 mg 250 mL/hr over 120 Minutes Intravenous  Once 10/07/20 1048 10/07/20 1437         Subjective: Patient seen and examined.  Feels much better except some pain at the chest tube insertion site.  No shortness of breath.  Objective: Vitals:   10/11/20 0700 10/11/20 0800 10/11/20 0900 10/11/20 1000  BP: 135/64 139/66    Pulse: (!) 106 (!) 102 94 98  Resp: (!) 31 (!) 29 (!) 32 20  Temp:  98 F (36.7 C)    TempSrc:  Oral    SpO2: 98% 99% 100% 94%  Weight:      Height:        Intake/Output Summary (Last 24 hours) at 10/11/2020 1020 Last data filed at 10/11/2020 1000 Gross per 24 hour  Intake 2204.61 ml  Output 3725 ml  Net -1520.39 ml   Filed Weights   10/07/20 1006  Weight: (!) 146.7 kg    Examination:  General exam: Appears calm and comfortable, morbidly obese Respiratory system: Coarse breath sounds bilaterally. Respiratory effort normal. Cardiovascular system: S1 & S2 heard, RRR. No JVD, murmurs, rubs, gallops or clicks. No pedal edema. Gastrointestinal system: Abdomen is nondistended, soft and nontender. No organomegaly or masses felt. Normal bowel sounds heard. Central nervous system: Alert and oriented. No focal neurological deficits. Extremities: Symmetric 5 x 5 power. Skin: No rashes, lesions or ulcers Psychiatry: Judgement and insight appear normal. Mood & affect appropriate.    Data Reviewed: I have personally reviewed following labs and imaging studies  CBC: Recent Labs  Lab 10/07/20 1101 10/08/20 0132 10/09/20 1536 10/09/20 1751 10/09/20 2021 10/10/20 0422 10/10/20 0458  10/11/20 0435  WBC 7.3 7.5  --   --  8.2 7.7  --  6.4  NEUTROABS 5.6  --   --   --   --   --   --   --   HGB 11.0* 10.9*   < > 10.2* 9.7* 9.6* 9.5* 9.1*  HCT 33.6* 34.2*   < > 30.0* 30.5* 30.2* 28.0* 28.7*  MCV 91.8 93.2  --   --  92.7 92.9  --  94.1  PLT 358 349  --   --  324 303  --  297   < > = values in this interval not displayed.   Basic Metabolic Panel: Recent Labs  Lab 10/07/20 1101 10/08/20 0132 10/09/20 1536 10/09/20 1751 10/09/20 2021 10/10/20 0422 10/10/20 0458 10/11/20 0435  NA 134* 133*   < > 135 135 134* 138 134*  K 4.2 4.6   < > 5.5* 5.0 5.1 4.6 4.2  CL 99 98  --   --  100 101  --  98  CO2 25 27  --   --  28 28  --  29  GLUCOSE 103* 112*  --   --  138* 279*  --  110*  BUN 10 11  --   --  13 14  --  19  CREATININE 0.76 0.78  --   --  0.87 0.81  --  0.90  CALCIUM 9.1 8.9  --   --  8.9 9.0  --  8.8*  MG  --   --   --   --   --  1.9  --   --    < > = values in this interval not displayed.   GFR: Estimated Creatinine Clearance: 163.2 mL/min (by C-G formula based on SCr of 0.9 mg/dL). Liver Function Tests: Recent Labs  Lab 10/07/20 1101 10/11/20 0435  AST 29 25  ALT 17 15  ALKPHOS 128* 92  BILITOT 0.6 0.5  PROT 8.0 6.6  ALBUMIN 2.4* 2.2*   No results for input(s): LIPASE, AMYLASE in the last 168 hours. No results for input(s): AMMONIA in the last 168 hours. Coagulation Profile: No results for input(s): INR, PROTIME in the last 168 hours. Cardiac Enzymes: No results for input(s): CKTOTAL, CKMB, CKMBINDEX, TROPONINI in the last 168 hours. BNP (last 3 results) No results for input(s): PROBNP in the last 8760 hours. HbA1C: No results for input(s): HGBA1C in the last 72 hours. CBG: Recent Labs  Lab 10/09/20 2341 10/10/20 0407 10/10/20 0455 10/10/20 0817 10/10/20 1140  GLUCAP 115* 54* 176* 115* 121*   Lipid Profile: No results for input(s): CHOL, HDL, LDLCALC, TRIG, CHOLHDL, LDLDIRECT in the last 72 hours. Thyroid Function Tests: No results for  input(s): TSH, T4TOTAL, FREET4, T3FREE, THYROIDAB in the last 72 hours. Anemia Panel: No results for input(s): VITAMINB12, FOLATE, FERRITIN, TIBC, IRON, RETICCTPCT in the last 72 hours. Sepsis Labs: Recent Labs  Lab 10/07/20 1101 10/07/20 1412  PROCALCITON 0.47  --   LATICACIDVEN 1.1 1.7    Recent Results (from the past 240 hour(s))  Culture, blood (routine x 2)     Status: None (Preliminary result)   Collection Time: 10/07/20 11:01 AM   Specimen: BLOOD LEFT HAND  Result Value Ref Range Status   Specimen Description BLOOD LEFT HAND  Final   Special Requests   Final    BOTTLES DRAWN AEROBIC ONLY Blood Culture results may not be optimal due to an inadequate volume of blood received in culture bottles   Culture   Final    NO GROWTH 4 DAYS Performed at [facility], [address], [place], [place] [address]    Report Status PENDING  Incomplete  Culture, blood (routine x 2)     Status: None (Preliminary result)   Collection Time: 10/07/20 11:14 AM   Specimen: BLOOD RIGHT HAND  Result Value Ref Range Status   Specimen Description BLOOD RIGHT HAND  Final   Special Requests   Final    BOTTLES DRAWN AEROBIC ONLY Blood Culture results may not be optimal due  to an inadequate volume of blood received in culture bottles   Culture   Final    NO GROWTH 4 DAYS Performed at [facility], [address] [place], [place] [address]    Report Status PENDING  Incomplete  Surgical PCR screen     Status: Abnormal   Collection Time: 10/08/20  8:58 PM   Specimen: Nasal Mucosa; Nasal Swab  Result Value Ref Range Status   MRSA, PCR POSITIVE (A) NEGATIVE Final    Comment: RESULT CALLED TO, READ BACK BY AND VERIFIED WITH: O.P. RN 10/09/20 0015 JDW    Staphylococcus aureus POSITIVE (A) NEGATIVE Final    Comment: (NOTE) The Xpert SA Assay (FDA approved for NASAL specimens in patients 4 years of age and older), is one component of a comprehensive surveillance program. It is not intended  to diagnose infection nor to guide or monitor treatment. Performed at [facility], [address], [place], [place] [address]   Aerobic/Anaerobic Culture w Gram Stain (surgical/deep wound)     Status: None (Preliminary result)   Collection Time: 10/09/20  3:46 PM   Specimen: PATH Cytology Pleural fluid; Body Fluid  Result Value Ref Range Status   Specimen Description FLUID PLEURAL RIGHT  Final   Special Requests NONE  Final   Gram Stain   Final    RARE WBC PRESENT,BOTH PMN AND MONONUCLEAR FEW GRAM POSITIVE COCCI IN PAIRS IN CLUSTERS    Culture   Final    ABUNDANT STAPHYLOCOCCUS AUREUS SUSCEPTIBILITIES TO FOLLOW Performed at [facility], [address], [place], [place] [address]    Report Status PENDING  Incomplete  Acid Fast Smear (AFB)     Status: None   Collection Time: 10/09/20  3:46 PM   Specimen: PATH Cytology Pleural fluid; Body Fluid  Result Value Ref Range Status   AFB Specimen Processing Concentration  Final   Acid Fast Smear Negative  Final    Comment: (NOTE) Performed At: [facility] [address] [place], [place] [id number] Jolene Schimke MD IO:[id number]    Source (AFB) FLUID  Final    Comment: PLEURAL RIGHT Performed at [facility], [address] [place], [place] [address]   Aerobic/Anaerobic Culture w Gram Stain (surgical/deep wound)     Status: None (Preliminary result)   Collection Time: 10/09/20  3:52 PM   Specimen: Soft Tissue, Other  Result Value Ref Range Status   Specimen Description TISSUE  Final   Special Requests PLEURAL PEEL  Final   Gram Stain   Final    RARE WBC PRESENT,BOTH PMN AND MONONUCLEAR RARE GRAM POSITIVE COCCI IN CLUSTERS    Culture   Final    MODERATE STAPHYLOCOCCUS AUREUS SUSCEPTIBILITIES TO FOLLOW Performed at [facility], [address], [place], [place] [address]    Report Status PENDING  Incomplete  Acid Fast Smear (AFB)     Status: None   Collection Time: 10/09/20  3:52 PM    Specimen: Soft Tissue, Other  Result Value Ref Range Status   AFB Specimen Processing Comment  Final    Comment: Tissue Grinding and Digestion/Decontamination   Acid Fast Smear Negative  Final    Comment: (NOTE) Performed At: [facility] [address] [place], [place] [id number] Jolene Schimke MD QQ:[id number]    Source (AFB) TISSUE  Final    Comment: PLEURAL PEEL Performed at [facility], [address], [place], [place] [address]  Radiology Studies: DG Abd 1 View  Result Date: 10/09/2020 CLINICAL DATA:  Assess OG tube placement EXAM: ABDOMEN - 1 VIEW COMPARISON:  Same day chest radiograph FINDINGS: Orogastric tube with tip and side port overlying the stomach. The bowel gas pattern is normal. Right basilar thoracostomy tube with right-sided pleural effusion. IMPRESSION: Orogastric tube with tip and side port overlying the stomach. Electronically Signed   By: Maudry MayhewJeffrey  Waltz MD   On: 10/09/2020 19:15   DG CHEST PORT 1 VIEW  Result Date: 10/11/2020 CLINICAL DATA:  Empyema. EXAM: PORTABLE CHEST 1 VIEW COMPARISON:  Chest x-ray 10/10/2020, 10/09/2020. FINDINGS: Interim extubation and removal of NG tube. Interim removal of right IJ line. Interim placement right PICC line, tip over SVC. Two right chest tubes again noted. Side hole of the lower chest tube may be within the chest wall. No pneumothorax. Persistent right base atelectasis/infiltrate and right-sided pleural effusion/empyema, significantly improved from 10/09/2020. No pneumothorax. Heart size stable. IMPRESSION: 1. Interim extubation removal of NG tube. Interim removal of right IJ line. Interim placement right PICC line, tip over SVC. Two right chest tubes again noted. Side hole of the lower chest tube may be within the chest wall. No pneumothorax. 2. Persistent right base atelectasis/infiltrate and right-sided pleural effusion/empyema, significant improved from 10/09/2020. Electronically Signed   By: Maisie Fushomas   Register   On: 10/11/2020 05:23   DG CHEST PORT 1 VIEW  Result Date: 10/10/2020 CLINICAL DATA:  Intubation. EXAM: PORTABLE CHEST 1 VIEW COMPARISON:  10/09/2020. FINDINGS: Interim placement of NG tube, its tip is below left hemidiaphragm. Endotracheal tube and right IJ line stable position. Two right chest tubes in stable position. Heart size stable. Persistent but improved bilateral subsegmental atelectasis. Improved right-sided pleural effusion. No pneumothorax. IMPRESSION: 1. Interim placement of NG tube, its tip is below the left hemidiaphragm. Remaining lines and tubes including 2 right chest tubes in stable position. No pneumothorax. 2. Persistent but improved bilateral subsegmental atelectasis. Improved right-sided pleural effusion. Electronically Signed   By: Maisie Fushomas  Register   On: 10/10/2020 06:35   DG CHEST PORT 1 VIEW  Result Date: 10/09/2020 CLINICAL DATA:  Surgery follow-up. EXAM: PORTABLE CHEST 1 VIEW COMPARISON:  Radiograph yesterday. FINDINGS: The endotracheal tube tip is at the level of the clavicular heads 5.5 cm from the carina. Right internal jugular central line tip projects over the region of the distal jugular vein. Two right basilar chest tubes in place. Decreased right pleural effusion. Residual pleural fluid at the lung base and tracking into the fissure. Patchy opacity throughout the right lower lung zone. Confluent opacity in the right upper lung zone may represent loculated pleural fluid or atelectasis/partial lobar collapse. No visualized pneumothorax. Left lung is clear. Heart size is normal for technique. IMPRESSION: 1. Two right basilar chest tubes in place with decreased right pleural effusion. 2. Small volume residual pleural fluid tracks into the minor fissure and at the lung base. Patchy opacity throughout the right hemithorax. Confluent opacity in the right upper lung zone may represent loculated pleural fluid or atelectasis/partial lobar collapse. 3. Endotracheal tube  tip 5.5 cm from the carina. Electronically Signed   By: Narda RutherfordMelanie  Sanford M.D.   On: 10/09/2020 18:12   US EKG SITE RITE  Result Date: 10/10/2020 If Site Rite image not attached, placement could not be confirmed due to current cardiac rhythm.   Scheduled Meds: . acetaminophen  1,000 mg Per Tube Q6H   Or  . acetaminophen (TYLENOL) oral liquid 160 mg/5 mL  1,000 mg  Per Tube Q6H  . Chlorhexidine Gluconate Cloth  6 each Topical Q0600  . cloNIDine  0.1 mg Per Tube QAC breakfast  . docusate  100 mg Per Tube BID  . mouth rinse  15 mL Mouth Rinse BID  . mupirocin ointment  1 application Nasal BID  . nicotine  14 mg Transdermal Daily  . oxyCODONE  10 mg Per Tube Q4H  . pantoprazole (PROTONIX) IV  40 mg Intravenous Daily  . polyethylene glycol  17 g Per Tube Daily  . senna-docusate  2 tablet Per Tube QHS  . sodium chloride flush  10-40 mL Intracatheter Q12H  . sodium chloride flush  3 mL Intravenous Q12H   Continuous Infusions: . sodium chloride 10 mL/hr at 10/11/20 1000  . ceFEPime (MAXIPIME) IV Stopped (10/11/20 0720)  . lactated ringers Stopped (10/10/20 1310)  . vancomycin Stopped (10/11/20 0532)     LOS: 4 days   Time spent: 35 minutes   Hughie Closs, MD [facility]  10/11/2020, 10:20 AM   To contact the attending provider between 7A-7P or the covering provider during after hours 7P-7A, please log into the web site [email or web address].

## 2020-10-11 NOTE — Progress Notes (Signed)
POD #2 s/p Right VATS/thorc decort C/o pain but just received pain meds  BP 136/74 (BP Location: Right Arm)   Pulse (!) 105   Temp 98.6 F (37 C) (Oral)   Resp (!) 25   Ht 5\' 6"  (1.676 m)   Wt (!) 146.7 kg   SpO2 95%   BMI 52.20 kg/m  Alert/oriented Diminished BS right chest RRR  Intake/Output Summary (Last 24 hours) at 10/11/2020 1901 Last data filed at 10/11/2020 1800 Gross per 24 hour  Intake 1888.71 ml  Output 2740 ml  Net -851.29 ml   No AL A/p: await txfer to floor Pain control Derek Woods. 10/13/2020, MD [phone]

## 2020-10-12 ENCOUNTER — Inpatient Hospital Stay (HOSPITAL_COMMUNITY): Payer: Self-pay

## 2020-10-12 LAB — COMPREHENSIVE METABOLIC PANEL
ALT: 17 U/L (ref 0–44)
AST: 27 U/L (ref 15–41)
Albumin: 2.2 g/dL — ABNORMAL LOW (ref 3.5–5.0)
Alkaline Phosphatase: 103 U/L (ref 38–126)
Anion gap: 5 (ref 5–15)
BUN: 16 mg/dL (ref 6–20)
CO2: 28 mmol/L (ref 22–32)
Calcium: 8.6 mg/dL — ABNORMAL LOW (ref 8.9–10.3)
Chloride: 100 mmol/L (ref 98–111)
Creatinine, Ser: 0.81 mg/dL (ref 0.61–1.24)
GFR, Estimated: >60 mL/min (ref >60)
Glucose, Bld: 120 mg/dL — ABNORMAL HIGH (ref 70–99)
Potassium: 4 mmol/L (ref 3.5–5.1)
Sodium: 133 mmol/L — ABNORMAL LOW (ref 135–145)
Total Bilirubin: 0.5 mg/dL (ref 0.3–1.2)
Total Protein: 6.7 g/dL (ref 6.5–8.1)

## 2020-10-12 LAB — CBC WITH DIFFERENTIAL/PLATELET
Abs Immature Granulocytes: 0.04 10*3/uL (ref 0.00–0.07)
Basophils Absolute: 0 10*3/uL (ref 0.0–0.1)
Basophils Relative: 1 %
Eosinophils Absolute: 0.2 10*3/uL (ref 0.0–0.5)
Eosinophils Relative: 3 %
HCT: 29.6 % — ABNORMAL LOW (ref 39.0–52.0)
Hemoglobin: 9.4 g/dL — ABNORMAL LOW (ref 13.0–17.0)
Immature Granulocytes: 1 %
Lymphocytes Relative: 26 %
Lymphs Abs: 1.6 10*3/uL (ref 0.7–4.0)
MCH: 30 pg (ref 26.0–34.0)
MCHC: 31.8 g/dL (ref 30.0–36.0)
MCV: 94.6 fL (ref 80.0–100.0)
Monocytes Absolute: 0.2 10*3/uL (ref 0.1–1.0)
Monocytes Relative: 3 %
Neutro Abs: 4 10*3/uL (ref 1.7–7.7)
Neutrophils Relative %: 66 %
Platelets: 299 10*3/uL (ref 150–400)
RBC: 3.13 MIL/uL — ABNORMAL LOW (ref 4.22–5.81)
RDW: 13.9 % (ref 11.5–15.5)
WBC: 6 10*3/uL (ref 4.0–10.5)
nRBC: 0 % (ref 0.0–0.2)

## 2020-10-12 LAB — CULTURE, BLOOD (ROUTINE X 2)
Culture: NO GROWTH
Culture: NO GROWTH

## 2020-10-12 LAB — VANCOMYCIN, RANDOM: Vancomycin Rm: 11

## 2020-10-12 IMAGING — DX DG CHEST 1V PORT
1 series · 1 of 1 positions shown · non-contrast
Comparison: [DATE].

CLINICAL DATA: Empyema.

EXAM:
PORTABLE CHEST 1 VIEW

[chest ap]
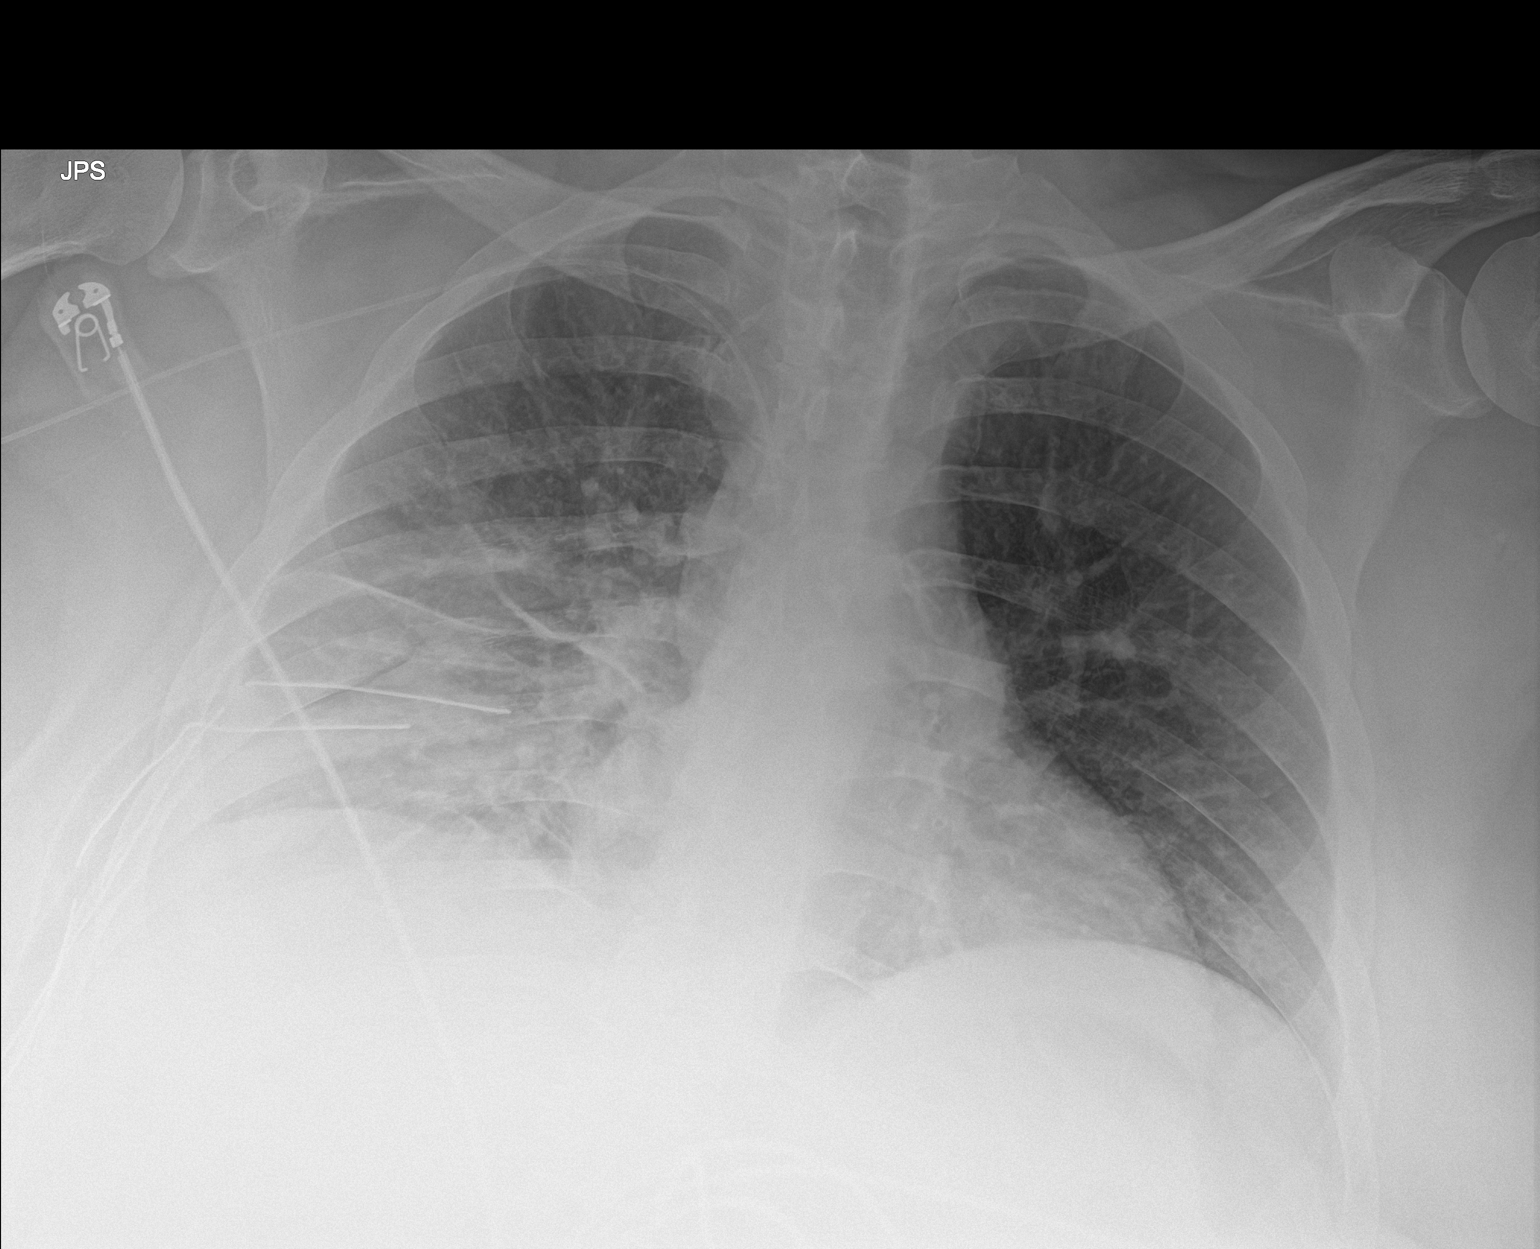

[1 of 1 positions shown; findings below may reference images not displayed]

FINDINGS: The heart size and mediastinal contours are within normal limits. No
pneumothorax is noted. Right-sided PICC line is unchanged in
position. Left lung is clear. Stable position of 2 right-sided chest
tubes. Stable right basilar atelectasis or infiltrate is noted as
well as probable pleural effusion. The visualized skeletal
structures are unremarkable.
IMPRESSION: Stable position of 2 right-sided chest tubes as well as stable
appearance of right basilar opacity concerning for atelectasis or
infiltrate and effusion.

## 2020-10-12 MED ORDER — BUPRENORPHINE HCL-NALOXONE HCL 8-2 MG SL SUBL
1.0000 | SUBLINGUAL_TABLET | Freq: Every day | SUBLINGUAL | Status: DC
  Administered 2020-10-12 – 2020-10-13 (×2): 1 via SUBLINGUAL
  Filled 2020-10-12 (×2): qty 1

## 2020-10-12 MED ORDER — VANCOMYCIN HCL 1500 MG/300ML IV SOLN
1500.0000 mg | INTRAVENOUS | Status: DC
  Filled 2020-10-12: qty 300

## 2020-10-12 MED ORDER — ACETAMINOPHEN 500 MG PO TABS
1000.0000 mg | ORAL_TABLET | Freq: Four times a day (QID) | ORAL | Status: AC
  Administered 2020-10-12 – 2020-10-14 (×8): 1000 mg via ORAL
  Filled 2020-10-12 (×8): qty 2

## 2020-10-12 MED ORDER — DICYCLOMINE HCL 20 MG PO TABS
20.0000 mg | ORAL_TABLET | Freq: Four times a day (QID) | ORAL | Status: AC | PRN
  Administered 2020-10-12 – 2020-10-15 (×10): 20 mg via ORAL
  Filled 2020-10-12 (×11): qty 1

## 2020-10-12 MED ORDER — ACETAMINOPHEN 160 MG/5ML PO SOLN: 1000.0000 mg | Freq: Four times a day (QID) | ORAL | Status: AC

## 2020-10-12 MED ORDER — METHOCARBAMOL 500 MG PO TABS
500.0000 mg | ORAL_TABLET | Freq: Three times a day (TID) | ORAL | Status: DC | PRN
  Administered 2020-10-12 – 2020-10-14 (×6): 500 mg via ORAL
  Filled 2020-10-12 (×6): qty 1

## 2020-10-12 MED ORDER — VANCOMYCIN HCL 2000 MG/400ML IV SOLN
2000.0000 mg | INTRAVENOUS | Status: DC
  Administered 2020-10-13 – 2020-10-17 (×5): 2000 mg via INTRAVENOUS
  Filled 2020-10-12 (×5): qty 400

## 2020-10-12 MED ORDER — SENNOSIDES-DOCUSATE SODIUM 8.6-50 MG PO TABS
2.0000 | ORAL_TABLET | Freq: Every day | ORAL | Status: DC
  Administered 2020-10-13 – 2020-10-19 (×3): 2 via ORAL
  Filled 2020-10-12 (×25): qty 2

## 2020-10-12 MED ORDER — POLYETHYLENE GLYCOL 3350 17 G PO PACK
17.0000 g | PACK | Freq: Every day | ORAL | Status: DC
  Administered 2020-10-27: 17 g via ORAL
  Filled 2020-10-12 (×20): qty 1

## 2020-10-12 MED ORDER — VANCOMYCIN HCL 500 MG/100ML IV SOLN
500.0000 mg | Freq: Once | INTRAVENOUS | Status: AC
  Administered 2020-10-12: 500 mg via INTRAVENOUS
  Filled 2020-10-12: qty 100

## 2020-10-12 MED ORDER — VANCOMYCIN HCL 1500 MG/300ML IV SOLN
1500.0000 mg | INTRAVENOUS | Status: DC
  Administered 2020-10-12: 1500 mg via INTRAVENOUS
  Filled 2020-10-12: qty 300

## 2020-10-12 MED ORDER — HYDROXYZINE HCL 25 MG PO TABS
25.0000 mg | ORAL_TABLET | Freq: Four times a day (QID) | ORAL | Status: AC | PRN
  Administered 2020-10-12 – 2020-10-15 (×11): 25 mg via ORAL
  Filled 2020-10-12 (×11): qty 1

## 2020-10-12 MED ORDER — DOCUSATE SODIUM 100 MG PO CAPS
100.0000 mg | ORAL_CAPSULE | Freq: Two times a day (BID) | ORAL | Status: DC
  Administered 2020-10-13 – 2020-11-15 (×12): 100 mg via ORAL
  Filled 2020-10-12 (×56): qty 1

## 2020-10-12 MED ORDER — CHLORHEXIDINE GLUCONATE CLOTH 2 % EX PADS
6.0000 | MEDICATED_PAD | Freq: Every day | CUTANEOUS | Status: DC
  Administered 2020-10-13 – 2020-11-26 (×41): 6 via TOPICAL

## 2020-10-12 NOTE — Progress Notes (Signed)
3 Days Post-Op Procedure(s) (LRB): RIGHT VATS/ THORACOTOMY/EMPYEMA (Right) Subjective: Sore chest   Objective: Vital signs in last 24 hours: Temp:  [97.9 F (36.6 C)-98.9 F (37.2 C)] 98.9 F (37.2 C) (04/29 0400) Pulse Rate:  [92-113] 92 (04/29 0400) Cardiac Rhythm: Normal sinus rhythm (04/29 0400) Resp:  [17-35] 27 (04/29 0400) BP: (99-149)/(48-87) 131/83 (04/29 0400) SpO2:  [90 %-100 %] 95 % (04/29 0400)  Hemodynamic parameters for last 24 hours:    Intake/Output from previous day: 04/28 0701 - 04/29 0700 In: 1236.5 [P.O.:960; I.V.:209.6; IV Piggyback:66.8] Out: 1350 [Urine:1250; Chest Tube:100] Intake/Output this shift: No intake/output data recorded.  General appearance: alert and cooperative Heart: regular rate and rhythm Lungs: clear to auscultation bilaterally Wound: dressing dry chest tube output low, serous with no leak  Lab Results: Recent Labs    10/11/20 0435 10/12/20 0435  WBC 6.4 6.0  HGB 9.1* 9.4*  HCT 28.7* 29.6*  PLT 297 299   BMET:  Recent Labs    10/11/20 0435 10/12/20 0435  NA 134* 133*  K 4.2 4.0  CL 98 100  CO2 29 28  GLUCOSE 110* 120*  BUN 19 16  CREATININE 0.90 0.81  CALCIUM 8.8* 8.6*    PT/INR: No results for input(s): LABPROT, INR in the last 72 hours. ABG    Component Value Date/Time   PHART 7.386 10/10/2020 0458   HCO3 28.7 (H) 10/10/2020 0458   TCO2 30 10/10/2020 0458   O2SAT 99.0 10/10/2020 0458   CBG (last 3)  Recent Labs    10/10/20 1140 10/11/20 1128 10/11/20 1559  GLUCAP 121* 140* 128*   CXR: stable aeration  Assessment/Plan: S/P Procedure(s) (LRB): RIGHT VATS/ THORACOTOMY/EMPYEMA (Right)  POD 3  Doing well overall. Remains afebrile with normal WBC ct. Culture of empyema grew abundant staph aureus, sens pending. Continue maxipime and vanc pending sens.   DC posterior chest tube today and and should be able to remove the other tube tomorrow.  IS, OOB, Mobilization.   LOS: 5 days    [name] 10/12/2020

## 2020-10-12 NOTE — Progress Notes (Signed)
PROGRESS NOTE    Derek Woods  WPY:[id number] DOB: June 28, 1990 DOA: 10/07/2020 PCP: Pcp, No   Brief Narrative:  Derek Woods is a 30 y.o. M, with a pertinent pmx of daily heroin IVDA (previous suboxone use), asthma, morbid obesity (BMI 52), 1 PPD smoking, possible OSA, presented to [facility] on 4/24 with complaints of back pain, right flank pain, and one week with shortness of breath . CT of his chest revealed a large right empyema. He was admitted to [facility] by the hospitalist team.  He was taken to the OR on 4/26 with Dr. Laneta Simmers for a VATS and drainage of right empyema, however a right thoracotomy had to be prefomed. He was a difficult intubation for the procedure and he was unable to be weaned from the ventilator post operatively. There was reported difficulty with oxygenation during the procedure. PCCM was consulted for ventilator management who assumed care as primary team.  Patient was extubated on 10/10/2020 and was transferred under [facility] care on 10/11/2020.  Assessment & Plan:   Principal Problem:   Empyema lung (HCC) Active Problems:   Polysubstance dependence including opioid type drug with complication, episodic abuse (HCC)   Morbid obesity with BMI of 50.0-59.9, adult (HCC)   Tobacco dependence  Acute respiratory failure with hypercarbia and hypoxia/right empyema s/p right thoracotomy on 10/09/2020/suspected OSA/OHS: Patient required intubation on 10/09/2020 and was extubated on 10/10/2020.  S/p thoracotomy.  Currently has chest tube on the right side which is at waterseal.  Managed by CT surgery.  They plan to remove 1 of those out today.  Cultures growing gram-positive cocci/staph aureus, sensitivities pending.  Continue vancomycin.Marland Kitchen  Hypotension: Resolved.  Blood pressure better today.  Polysubstance abuse: He is requesting to resume his on Suboxone.  He did receive Suboxone before going for VATS.  He claims that he takes Suboxone prior to hospitalization however this medication is not as  part of his home medication.  We are trying to figure out with the pharmacy.  At this point in time, I am resuming his low-dose of Suboxone once daily.  No opioids.  Continue Toradol.  Normocytic anemia: Hemoglobin is stable over 9.  Watch daily.  Tobacco dependence: Will need lengthy counseling.  DVT prophylaxis: SCD's Start: 10/09/20 1716   Code Status: Full Code  Family Communication: None present at bedside.  Plan of care discussed with patient in length and he verbalized understanding and agreed with it.  Status is: Inpatient  Remains inpatient appropriate because:Inpatient level of care appropriate due to severity of illness   Dispo: The patient is from: Home              Anticipated d/c is to: Home              Patient currently is not medically stable to d/c.   Difficult to place patient No        Estimated body mass index is 52.2 kg/m as calculated from the following:   Height as of this encounter: 5\' 6"  (1.676 m).   Weight as of this encounter: 146.7 kg.      Nutritional status:               Consultants:   CT surgery  Procedures:   Chest tube placement, thoracotomy and intubation  Antimicrobials:  Anti-infectives (From admission, onward)   Start     Dose/Rate Route Frequency Ordered Stop   10/12/20 0600  vancomycin (VANCOREADY) IVPB 1500 mg/300 mL  Status:  Discontinued  1,500 mg 150 mL/hr over 120 Minutes Intravenous Every 24 hours 10/12/20 0541 10/12/20 0556   10/12/20 0600  vancomycin (VANCOREADY) IVPB 1500 mg/300 mL        1,500 mg 150 mL/hr over 120 Minutes Intravenous Every 18 hours 10/12/20 0556     10/11/20 1343  vancomycin variable dose per unstable renal function (pharmacist dosing)  Status:  Discontinued         Does not apply See admin instructions 10/11/20 1344 10/12/20 0556   10/10/20 0400  vancomycin (VANCOREADY) IVPB 1250 mg/250 mL  Status:  Discontinued        1,250 mg 166.7 mL/hr over 90 Minutes Intravenous Every 8  hours 10/09/20 1835 10/11/20 1344   10/09/20 2000  vancomycin (VANCOREADY) IVPB 1250 mg/250 mL  Status:  Discontinued        1,250 mg 166.7 mL/hr over 90 Minutes Intravenous Every 8 hours 10/09/20 1117 10/09/20 1835   10/09/20 1900  vancomycin (VANCOCIN) 2,500 mg in sodium chloride 0.9 % 500 mL IVPB        2,500 mg 250 mL/hr over 120 Minutes Intravenous  Once 10/09/20 1835 10/09/20 2137   10/09/20 1200  vancomycin (VANCOCIN) 2,500 mg in sodium chloride 0.9 % 500 mL IVPB  Status:  Discontinued        2,500 mg 250 mL/hr over 120 Minutes Intravenous  Once 10/09/20 1113 10/09/20 1835   10/07/20 2200  vancomycin (VANCOREADY) IVPB 2000 mg/400 mL  Status:  Discontinued        2,000 mg 200 mL/hr over 120 Minutes Intravenous Every 12 hours 10/07/20 1251 10/07/20 1437   10/07/20 2000  ceFEPIme (MAXIPIME) 2 g in sodium chloride 0.9 % 100 mL IVPB  Status:  Discontinued        2 g 200 mL/hr over 30 Minutes Intravenous Every 8 hours 10/07/20 1251 10/11/20 1342   10/07/20 1145  ceFEPIme (MAXIPIME) 2 g in sodium chloride 0.9 % 100 mL IVPB        2 g 200 mL/hr over 30 Minutes Intravenous  Once 10/07/20 1048 10/07/20 1351   10/07/20 1100  vancomycin (VANCOCIN) 2,500 mg in sodium chloride 0.9 % 500 mL IVPB  Status:  Discontinued        2,500 mg 250 mL/hr over 120 Minutes Intravenous  Once 10/07/20 1048 10/07/20 1437         Subjective: Seen and examined.  Complains of pain at the chest tube insertion site.  No shortness of breath.  Requesting to resume him back on Suboxone.  Objective: Vitals:   10/12/20 0800 10/12/20 0814 10/12/20 0900 10/12/20 1000  BP: 136/73 136/73    Pulse: (!) 104  (!) 104 95  Resp: 11  (!) 22 (!) 24  Temp: 98.6 F (37 C)     TempSrc: Oral     SpO2: 97%     Weight:      Height:        Intake/Output Summary (Last 24 hours) at 10/12/2020 1037 Last data filed at 10/12/2020 1000 Gross per 24 hour  Intake 1242.69 ml  Output 1525 ml  Net -282.31 ml   Filed Weights    10/07/20 1006  Weight: (!) 146.7 kg    Examination:  General exam: Appears calm and comfortable, morbidly obese Respiratory system: Diminished breath sounds at right base, chest tube on the right side.  Respiratory effort normal. Cardiovascular system: S1 & S2 heard, RRR. No JVD, murmurs, rubs, gallops or clicks. No pedal edema. Gastrointestinal system: Abdomen  is nondistended, soft and nontender. No organomegaly or masses felt. Normal bowel sounds heard. Central nervous system: Alert and oriented. No focal neurological deficits. Extremities: Symmetric 5 x 5 power. Skin: No rashes, lesions or ulcers.  Psychiatry: Judgement and insight appear normal. Mood & affect appropriate.    Data Reviewed: I have personally reviewed following labs and imaging studies  CBC: Recent Labs  Lab 10/07/20 1101 10/08/20 0132 10/09/20 1536 10/09/20 2021 10/10/20 0422 10/10/20 0458 10/11/20 0435 10/12/20 0435  WBC 7.3 7.5  --  8.2 7.7  --  6.4 6.0  NEUTROABS 5.6  --   --   --   --   --   --  4.0  HGB 11.0* 10.9*   < > 9.7* 9.6* 9.5* 9.1* 9.4*  HCT 33.6* 34.2*   < > 30.5* 30.2* 28.0* 28.7* 29.6*  MCV 91.8 93.2  --  92.7 92.9  --  94.1 94.6  PLT 358 349  --  324 303  --  297 299   < > = values in this interval not displayed.   Basic Metabolic Panel: Recent Labs  Lab 10/08/20 0132 10/09/20 1536 10/09/20 2021 10/10/20 0422 10/10/20 0458 10/11/20 0435 10/12/20 0435  NA 133*   < > 135 134* 138 134* 133*  K 4.6   < > 5.0 5.1 4.6 4.2 4.0  CL 98  --  100 101  --  98 100  CO2 27  --  28 28  --  29 28  GLUCOSE 112*  --  138* 279*  --  110* 120*  BUN 11  --  13 14  --  19 16  CREATININE 0.78  --  0.87 0.81  --  0.90 0.81  CALCIUM 8.9  --  8.9 9.0  --  8.8* 8.6*  MG  --   --   --  1.9  --   --   --    < > = values in this interval not displayed.   GFR: Estimated Creatinine Clearance: 181.3 mL/min (by C-G formula based on SCr of 0.81 mg/dL). Liver Function Tests: Recent Labs  Lab  10/07/20 1101 10/11/20 0435 10/12/20 0435  AST 29 25 27   ALT 17 15 17   ALKPHOS 128* 92 103  BILITOT 0.6 0.5 0.5  PROT 8.0 6.6 6.7  ALBUMIN 2.4* 2.2* 2.2*   No results for input(s): LIPASE, AMYLASE in the last 168 hours. No results for input(s): AMMONIA in the last 168 hours. Coagulation Profile: No results for input(s): INR, PROTIME in the last 168 hours. Cardiac Enzymes: No results for input(s): CKTOTAL, CKMB, CKMBINDEX, TROPONINI in the last 168 hours. BNP (last 3 results) No results for input(s): PROBNP in the last 8760 hours. HbA1C: No results for input(s): HGBA1C in the last 72 hours. CBG: Recent Labs  Lab 10/10/20 0455 10/10/20 0817 10/10/20 1140 10/11/20 1128 10/11/20 1559  GLUCAP 176* 115* 121* 140* 128*   Lipid Profile: No results for input(s): CHOL, HDL, LDLCALC, TRIG, CHOLHDL, LDLDIRECT in the last 72 hours. Thyroid Function Tests: No results for input(s): TSH, T4TOTAL, FREET4, T3FREE, THYROIDAB in the last 72 hours. Anemia Panel: No results for input(s): VITAMINB12, FOLATE, FERRITIN, TIBC, IRON, RETICCTPCT in the last 72 hours. Sepsis Labs: Recent Labs  Lab 10/07/20 1101 10/07/20 1412  PROCALCITON 0.47  --   LATICACIDVEN 1.1 1.7    Recent Results (from the past 240 hour(s))  Culture, blood (routine x 2)     Status: None   Collection Time: 10/07/20  11:01 AM   Specimen: BLOOD LEFT HAND  Result Value Ref Range Status   Specimen Description BLOOD LEFT HAND  Final   Special Requests   Final    BOTTLES DRAWN AEROBIC ONLY Blood Culture results may not be optimal due to an inadequate volume of blood received in culture bottles   Culture   Final    NO GROWTH 5 DAYS Performed at [facility], [address] [place], [place] [address]    Report Status 10/12/2020 FINAL  Final  Culture, blood (routine x 2)     Status: None   Collection Time: 10/07/20 11:14 AM   Specimen: BLOOD RIGHT HAND  Result Value Ref Range Status   Specimen Description BLOOD  RIGHT HAND  Final   Special Requests   Final    BOTTLES DRAWN AEROBIC ONLY Blood Culture results may not be optimal due to an inadequate volume of blood received in culture bottles   Culture   Final    NO GROWTH 5 DAYS Performed at [facility], [address] [place], [place] [address]    Report Status 10/12/2020 FINAL  Final  Surgical PCR screen     Status: Abnormal   Collection Time: 10/08/20  8:58 PM   Specimen: Nasal Mucosa; Nasal Swab  Result Value Ref Range Status   MRSA, PCR POSITIVE (A) NEGATIVE Final    Comment: RESULT CALLED TO, READ BACK BY AND VERIFIED WITH: O.P. RN 10/09/20 0015 JDW    Staphylococcus aureus POSITIVE (A) NEGATIVE Final    Comment: (NOTE) The Xpert SA Assay (FDA approved for NASAL specimens in patients 32 years of age and older), is one component of a comprehensive surveillance program. It is not intended to diagnose infection nor to guide or monitor treatment. Performed at [facility], [address] [place], [place] [address]   Aerobic/Anaerobic Culture w Gram Stain (surgical/deep wound)     Status: None (Preliminary result)   Collection Time: 10/09/20  3:46 PM   Specimen: PATH Cytology Pleural fluid; Body Fluid  Result Value Ref Range Status   Specimen Description FLUID PLEURAL RIGHT  Final   Special Requests NONE  Final   Gram Stain   Final    RARE WBC PRESENT,BOTH PMN AND MONONUCLEAR FEW GRAM POSITIVE COCCI IN PAIRS IN CLUSTERS    Culture   Final    ABUNDANT STAPHYLOCOCCUS AUREUS SUSCEPTIBILITIES TO FOLLOW Performed at [facility], [address] [place], [place] [address]    Report Status PENDING  Incomplete  Acid Fast Smear (AFB)     Status: None   Collection Time: 10/09/20  3:46 PM   Specimen: PATH Cytology Pleural fluid; Body Fluid  Result Value Ref Range Status   AFB Specimen Processing Concentration  Final   Acid Fast Smear Negative  Final    Comment: (NOTE) Performed At: [facility] [address] [place], [place] [id number] Jolene SchimkeNagendra Sanjai MD MV:[id number]    Source (AFB) FLUID  Final    Comment: PLEURAL RIGHT Performed at [facility], [address] [place], [place] [address]   Aerobic/Anaerobic Culture w Gram Stain (surgical/deep wound)     Status: None (Preliminary result)   Collection Time: 10/09/20  3:52 PM   Specimen: Soft Tissue, Other  Result Value Ref Range Status   Specimen Description TISSUE  Final   Special Requests PLEURAL PEEL  Final   Gram Stain   Final    RARE WBC PRESENT,BOTH  PMN AND MONONUCLEAR RARE GRAM POSITIVE COCCI IN CLUSTERS    Culture   Final    MODERATE STAPHYLOCOCCUS AUREUS SUSCEPTIBILITIES TO FOLLOW Performed at [facility], [address], [place], [place] [address]    Report Status PENDING  Incomplete  Acid Fast Smear (AFB)     Status: None   Collection Time: 10/09/20  3:52 PM   Specimen: Soft Tissue, Other  Result Value Ref Range Status   AFB Specimen Processing Comment  Final    Comment: Tissue Grinding and Digestion/Decontamination   Acid Fast Smear Negative  Final    Comment: (NOTE) Performed At: [facility] [address] [place], [place] [id number] Jolene Schimke MD AO:[id number]    Source (AFB) TISSUE  Final    Comment: PLEURAL PEEL Performed at [facility], [address], [place], [place] [address]       Radiology Studies: DG Chest Port 1 View  Result Date: 10/12/2020 CLINICAL DATA:  Empyema. EXAM: PORTABLE CHEST 1 VIEW COMPARISON:  October 11, 2020. FINDINGS: The heart size and mediastinal contours are within normal limits. No pneumothorax is noted. Right-sided PICC line is unchanged in position. Left lung is clear. Stable position of 2 right-sided chest tubes. Stable right basilar atelectasis or infiltrate is noted as well as probable pleural effusion. The visualized skeletal structures are unremarkable. IMPRESSION: Stable position of 2 right-sided chest tubes as well as stable appearance  of right basilar opacity concerning for atelectasis or infiltrate and effusion. Electronically Signed   By: Lupita Raider M.D.   On: 10/12/2020 07:55   DG CHEST PORT 1 VIEW  Result Date: 10/11/2020 CLINICAL DATA:  Empyema. EXAM: PORTABLE CHEST 1 VIEW COMPARISON:  Chest x-ray 10/10/2020, 10/09/2020. FINDINGS: Interim extubation and removal of NG tube. Interim removal of right IJ line. Interim placement right PICC line, tip over SVC. Two right chest tubes again noted. Side hole of the lower chest tube may be within the chest wall. No pneumothorax. Persistent right base atelectasis/infiltrate and right-sided pleural effusion/empyema, significantly improved from 10/09/2020. No pneumothorax. Heart size stable. IMPRESSION: 1. Interim extubation removal of NG tube. Interim removal of right IJ line. Interim placement right PICC line, tip over SVC. Two right chest tubes again noted. Side hole of the lower chest tube may be within the chest wall. No pneumothorax. 2. Persistent right base atelectasis/infiltrate and right-sided pleural effusion/empyema, significant improved from 10/09/2020. Electronically Signed   By: Maisie Fus  Register   On: 10/11/2020 05:23   [place] EKG SITE RITE  Result Date: 10/10/2020 If Site Rite image not attached, placement could not be confirmed due to current cardiac rhythm.   Scheduled Meds: . acetaminophen  1,000 mg Per Tube Q6H   Or  . acetaminophen (TYLENOL) oral liquid 160 mg/5 mL  1,000 mg Per Tube Q6H  . buprenorphine-naloxone  1 tablet Sublingual Daily  . [START ON 10/13/2020] Chlorhexidine Gluconate Cloth  6 each Topical Daily  . docusate  100 mg Per Tube BID  . mouth rinse  15 mL Mouth Rinse BID  . mupirocin ointment  1 application Nasal BID  . nicotine  14 mg Transdermal Daily  . pantoprazole (PROTONIX) IV  40 mg Intravenous Daily  . polyethylene glycol  17 g Per Tube Daily  . senna-docusate  2 tablet Per Tube QHS  . sodium chloride flush  10-40 mL Intracatheter Q12H  .  sodium chloride flush  3 mL Intravenous Q12H   Continuous Infusions: . sodium chloride 10 mL/hr at  10/12/20 1000  . lactated ringers Stopped (10/10/20 1310)  . vancomycin Stopped (10/12/20 0809)     LOS: 5 days   Time spent: 30 minutes   Hughie Closs, MD [facility]  10/12/2020, 10:37 AM   To contact the attending provider between 7A-7P or the covering provider during after hours 7P-7A, please log into the web site [email or web address].

## 2020-10-12 NOTE — Progress Notes (Signed)
Pharmacy Antibiotic Note  Derek Woods is a 30 y.o. male admitted on 10/07/2020 with loculated pleural effusion with empyema now s/p VATS and thoracotomy. Patient screened MRSA positive. Pharmacy has been consulted for vancomycin and cefepime dosing.  WBC wnl, afebrile. Renal function stable, Scr 0.8, CrCl 181 ml/min. Cultures growing staph aureus, sensitivities pending. Pharmacy asked to stop cefepime and continue vanc.  Vancomycin level 4/28 1218 30 mcg/ml Vancomycin level 4/29 0435 11 mcg/ml  Pharmacokinetics: ke 0.062; half-life 11.2 hours  Received 1500 mg Vancomycin this morning based off pharmacokinetic data. Will adjust q18h dose to q24h dosing for ease of monitoring/administration.  Plan: Give another Vancomycin 500 mg now, then adjust to 2000mg  IV q24 hrs  Monitor renal function, culture sensitivities, vanc level at [facility]   Height: 5\' 6"  (167.6 cm) Weight: (!) 146.7 kg (323 lb 6.4 oz) IBW/kg (Calculated) : 63.8  Temp (24hrs), Avg:98.5 F (36.9 C), Min:97.9 F (36.6 C), Max:98.9 F (37.2 C)  Recent Labs  Lab 10/07/20 1101 10/07/20 1412 10/08/20 0132 10/09/20 2021 10/10/20 0422 10/11/20 0435 10/11/20 1218 10/12/20 0435  WBC 7.3  --  7.5 8.2 7.7 6.4  --  6.0  CREATININE 0.76  --  0.78 0.87 0.81 0.90  --  0.81  LATICACIDVEN 1.1 1.7  --   --   --   --   --   --   VANCOTROUGH  --   --   --   --   --   --  30*  --   VANCORANDOM  --   --   --   --   --   --   --  11    Estimated Creatinine Clearance: 181.3 mL/min (by C-G formula based on SCr of 0.81 mg/dL).    Allergies  Allergen Reactions  . Augmentin [Amoxicillin-Pot Clavulanate] Other (See Comments)    Pt does not recall, childhood  . Ceclor [Cefaclor] Other (See Comments)    Pt does not recall, childhood  . Sulfa Antibiotics Hives    Antimicrobials this admission: 4/24 vancomycin >> 4/24 cefepime >> 4/28  Dose adjustments this admission: 4/28 Vanc random 30 - hold vancomycin 4/29 Vanc random 11 - start  2000mg  IV q24h  Microbiology results: 4/24 BCx: ngtd (collected prior to abx) 4/25 MRSA +  4/26 pleural tissue: staph aureus 4/26 pleural fluid: staph aureus   5/25, PharmD, BCPS PGY2 Cardiology Pharmacy Resident Phone: [phone] 10/12/2020  11:19 AM  Please check [email or web address] for unit-specific pharmacy phone numbers.

## 2020-10-12 NOTE — Progress Notes (Addendum)
Pharmacy Antibiotic Note  Derek Woods is a 30 y.o. male admitted on 10/07/2020 with loculated pleural effusion with empyema now s/p VATS and thoracotomy. Patient screened MRSA positive. Pharmacy has been consulted for vancomycin and cefepime dosing.  WBC wnl, afebrile. Renal function stable, Scr 0.9, CrCl 163 ml/min. Cultures growing staph aureus, sensitivities pending. Pharmacy asked to stop cefepime and continue vanc.  Vancomycin level 4/28 1218 30 mcg/ml Vancomycin level 4/29 0435 11 mcg/ml  Pharmacokinetics: ke 0.062; half-life 12 hours  Plan: Vancomycin 1250mg  IV q18h Monitor renal function, culture sensitivities, vanc level at [facility]   Height: 5\' 6"  (167.6 cm) Weight: (!) 146.7 kg (323 lb 6.4 oz) IBW/kg (Calculated) : 63.8  Temp (24hrs), Avg:98.5 F (36.9 C), Min:97.9 F (36.6 C), Max:98.9 F (37.2 C)  Recent Labs  Lab 10/07/20 1101 10/07/20 1412 10/08/20 0132 10/09/20 2021 10/10/20 0422 10/11/20 0435 10/11/20 1218 10/12/20 0435  WBC 7.3  --  7.5 8.2 7.7 6.4  --  6.0  CREATININE 0.76  --  0.78 0.87 0.81 0.90  --  0.81  LATICACIDVEN 1.1 1.7  --   --   --   --   --   --   VANCOTROUGH  --   --   --   --   --   --  30*  --   VANCORANDOM  --   --   --   --   --   --   --  11    Estimated Creatinine Clearance: 181.3 mL/min (by C-G formula based on SCr of 0.81 mg/dL).    Allergies  Allergen Reactions  . Augmentin [Amoxicillin-Pot Clavulanate] Other (See Comments)    Pt does not recall, childhood  . Ceclor [Cefaclor] Other (See Comments)    Pt does not recall, childhood  . Sulfa Antibiotics Hives    Antimicrobials this admission: 4/24 vancomycin >> 4/24 cefepime >> 4/28  Dose adjustments this admission: 4/28 Vanc random 30 - hold vancomycin 4/29 Vanc random 11 - start 1250mg  IV q18h  Microbiology results: 4/24 BCx: ngtd (collected prior to abx) 4/25 MRSA +  4/26 pleural tissue: staph aureus 4/26 pleural fluid: staph aureus   5/25, PharmD,  BCPS Please see [email or web address] for complete clinical pharmacist phone list 10/12/2020  5:42 AM  Please check [email or web address] for unit-specific pharmacy phone numbers.

## 2020-10-12 NOTE — Plan of Care (Signed)
  Problem: Education: Goal: Knowledge of General Education information will improve Description: Including pain rating scale, medication(s)/side effects and non-pharmacologic comfort measures Outcome: Progressing   Problem: Health Behavior/Discharge Planning: Goal: Ability to manage health-related needs will improve Outcome: Progressing   Problem: Clinical Measurements: Goal: Ability to maintain clinical measurements within normal limits will improve Outcome: Progressing   Problem: Clinical Measurements: Goal: Diagnostic test results will improve Outcome: Progressing   Problem: Activity: Goal: Risk for activity intolerance will decrease Outcome: Progressing   Problem: Nutrition: Goal: Adequate nutrition will be maintained Outcome: Progressing   Problem: Coping: Goal: Level of anxiety will decrease Outcome: Progressing   Problem: Pain Managment: Goal: General experience of comfort will improve Outcome: Progressing   Problem: Skin Integrity: Goal: Risk for impaired skin integrity will decrease Outcome: Progressing   

## 2020-10-13 ENCOUNTER — Inpatient Hospital Stay (HOSPITAL_COMMUNITY): Payer: Self-pay

## 2020-10-13 DIAGNOSIS — A4902 Methicillin resistant Staphylococcus aureus infection, unspecified site: Secondary | ICD-10-CM

## 2020-10-13 LAB — CBC WITH DIFFERENTIAL/PLATELET
Abs Immature Granulocytes: 0.04 10*3/uL (ref 0.00–0.07)
Basophils Absolute: 0 10*3/uL (ref 0.0–0.1)
Basophils Relative: 1 %
Eosinophils Absolute: 0.2 10*3/uL (ref 0.0–0.5)
Eosinophils Relative: 4 %
HCT: 28.2 % — ABNORMAL LOW (ref 39.0–52.0)
Hemoglobin: 9.1 g/dL — ABNORMAL LOW (ref 13.0–17.0)
Immature Granulocytes: 1 %
Lymphocytes Relative: 30 %
Lymphs Abs: 1.8 10*3/uL (ref 0.7–4.0)
MCH: 30.1 pg (ref 26.0–34.0)
MCHC: 32.3 g/dL (ref 30.0–36.0)
MCV: 93.4 fL (ref 80.0–100.0)
Monocytes Absolute: 0.2 10*3/uL (ref 0.1–1.0)
Monocytes Relative: 3 %
Neutro Abs: 3.7 10*3/uL (ref 1.7–7.7)
Neutrophils Relative %: 61 %
Platelets: 262 10*3/uL (ref 150–400)
RBC: 3.02 MIL/uL — ABNORMAL LOW (ref 4.22–5.81)
RDW: 13.9 % (ref 11.5–15.5)
WBC: 6.2 10*3/uL (ref 4.0–10.5)
nRBC: 0.3 % — ABNORMAL HIGH (ref 0.0–0.2)

## 2020-10-13 LAB — COMPREHENSIVE METABOLIC PANEL
ALT: 20 U/L (ref 0–44)
AST: 32 U/L (ref 15–41)
Albumin: 2.1 g/dL — ABNORMAL LOW (ref 3.5–5.0)
Alkaline Phosphatase: 107 U/L (ref 38–126)
Anion gap: 6 (ref 5–15)
BUN: 19 mg/dL (ref 6–20)
CO2: 28 mmol/L (ref 22–32)
Calcium: 8.7 mg/dL — ABNORMAL LOW (ref 8.9–10.3)
Chloride: 100 mmol/L (ref 98–111)
Creatinine, Ser: 0.76 mg/dL (ref 0.61–1.24)
GFR, Estimated: >60 mL/min (ref >60)
Glucose, Bld: 123 mg/dL — ABNORMAL HIGH (ref 70–99)
Potassium: 4.3 mmol/L (ref 3.5–5.1)
Sodium: 134 mmol/L — ABNORMAL LOW (ref 135–145)
Total Bilirubin: 0.6 mg/dL (ref 0.3–1.2)
Total Protein: 6.4 g/dL — ABNORMAL LOW (ref 6.5–8.1)

## 2020-10-13 IMAGING — DX DG CHEST 1V PORT
1 series · 1 of 1 positions shown · non-contrast
Comparison: [DATE]

CLINICAL DATA: Empyema.  Chest tube.

EXAM:
PORTABLE CHEST 1 VIEW

[chest]
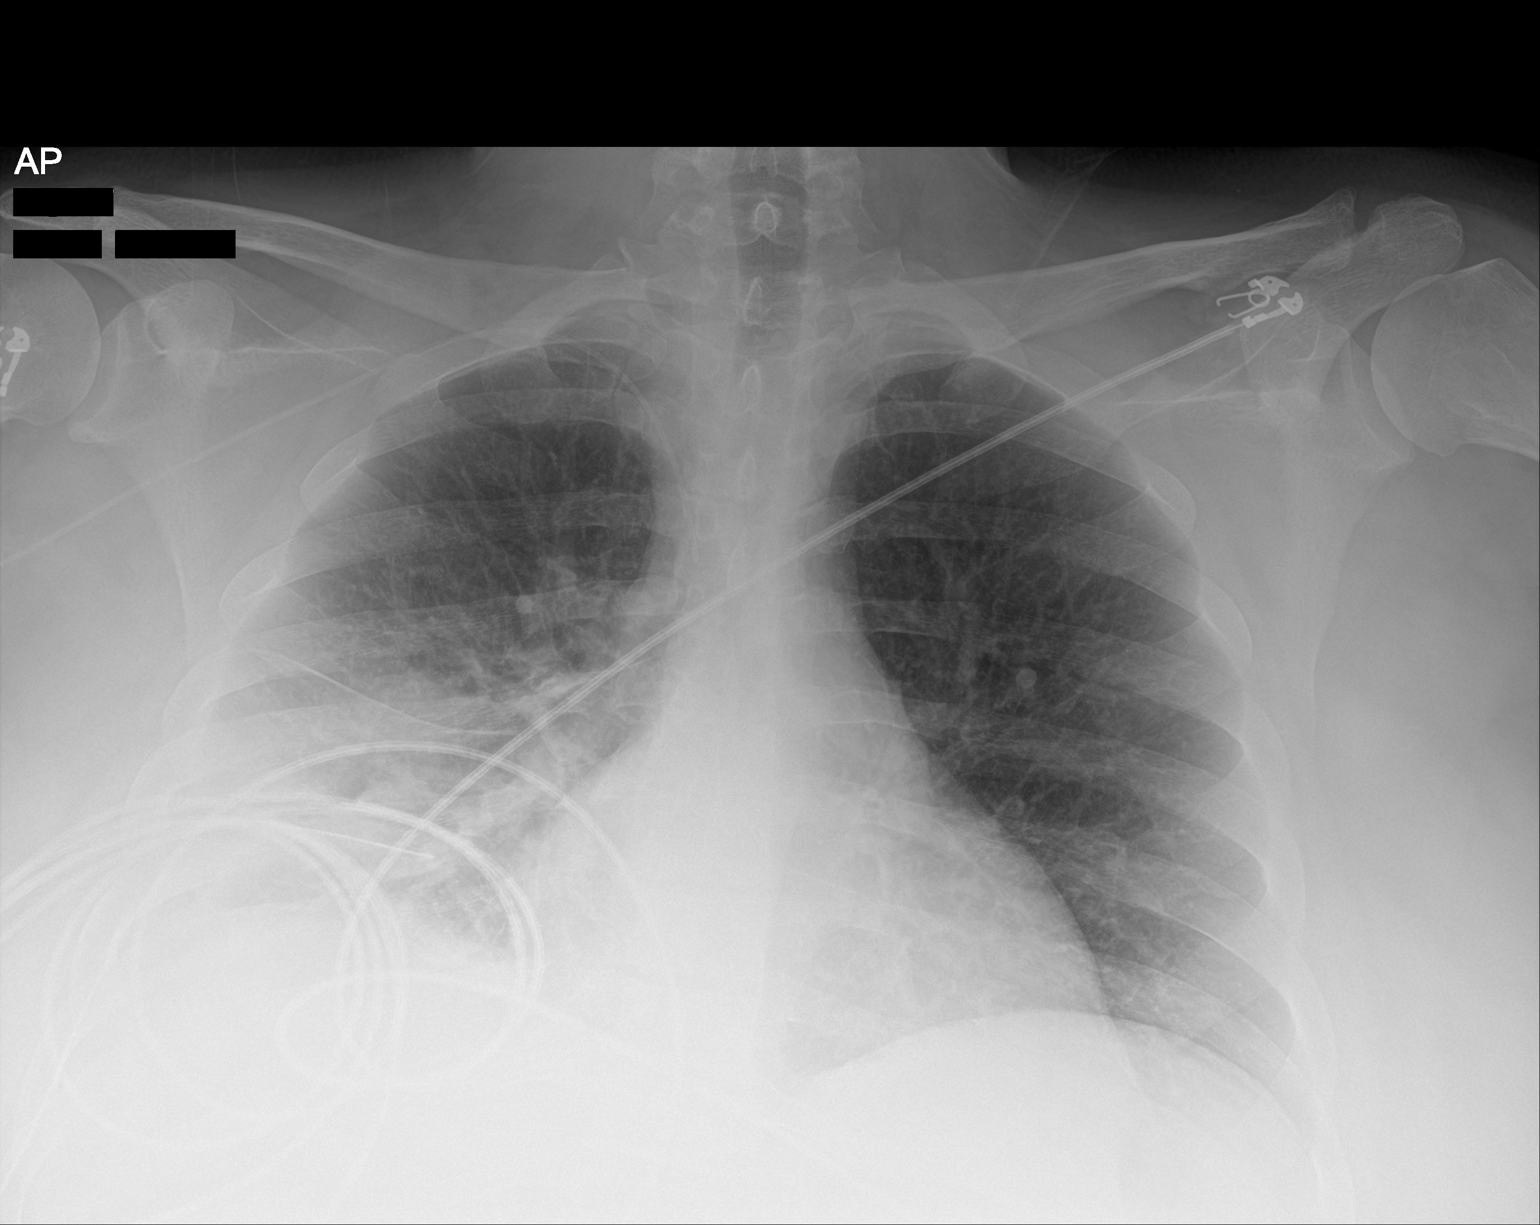

[1 of 1 positions shown; findings below may reference images not displayed]

FINDINGS: The right PICC line terminates in the SVC just below the
brachiocephalic confluence. No pneumothorax. The cardiomediastinal
silhouette is stable. The left lung is clear. Opacity remains in the
right base. Two right-sided chest tubes remain. No other acute
abnormalities.
IMPRESSION: 1. Support apparatus as above.
2. Infiltrate/opacity in the right mid and lower lung could
represent pneumonia, aspiration, or remaining pleural fluid/empyema
with adjacent atelectasis. Recommend clinical correlation. A CT scan
of the chest could better evaluate the amount of remaining pleural
fluid/empyema.

## 2020-10-13 MED ORDER — BUPRENORPHINE HCL-NALOXONE HCL 2-0.5 MG SL SUBL
1.0000 | SUBLINGUAL_TABLET | Freq: Two times a day (BID) | SUBLINGUAL | Status: DC
  Administered 2020-10-14 – 2020-10-17 (×7): 1 via SUBLINGUAL
  Filled 2020-10-13 (×7): qty 1

## 2020-10-13 NOTE — Plan of Care (Signed)
  Problem: Education: Goal: Knowledge of General Education information will improve Description: Including pain rating scale, medication(s)/side effects and non-pharmacologic comfort measures Outcome: Progressing   Problem: Health Behavior/Discharge Planning: Goal: Ability to manage health-related needs will improve Outcome: Progressing   Problem: Clinical Measurements: Goal: Ability to maintain clinical measurements within normal limits will improve Outcome: Progressing   Problem: Clinical Measurements: Goal: Diagnostic test results will improve Outcome: Progressing   Problem: Activity: Goal: Risk for activity intolerance will decrease Outcome: Progressing   Problem: Nutrition: Goal: Adequate nutrition will be maintained Outcome: Progressing   Problem: Coping: Goal: Level of anxiety will decrease Outcome: Progressing   Problem: Pain Managment: Goal: General experience of comfort will improve Outcome: Progressing   Problem: Skin Integrity: Goal: Risk for impaired skin integrity will decrease Outcome: Progressing

## 2020-10-13 NOTE — Progress Notes (Signed)
PROGRESS NOTE    Derek Woods  MVE:[id number] DOB: 1990-09-24 DOA: 10/07/2020 PCP: Pcp, No   Brief Narrative:  Derek Woods is a 30 y.o. M, with a pertinent pmx of daily heroin IVDA (previous suboxone use), asthma, morbid obesity (BMI 52), 1 PPD smoking, possible OSA, presented to [facility] on 4/24 with complaints of back pain, right flank pain, and one week with shortness of breath . CT of his chest revealed a large right empyema. He was admitted to [facility] by the hospitalist team.  He was taken to the OR on 4/26 with Dr. Laneta Simmers for a VATS and drainage of right empyema, however a right thoracotomy had to be prefomed. He was a difficult intubation for the procedure and he was unable to be weaned from the ventilator post operatively. There was reported difficulty with oxygenation during the procedure. PCCM was consulted for ventilator management who assumed care as primary team.  Patient was extubated on 10/10/2020 and was transferred under [facility] care on 10/11/2020.  Assessment & Plan:   Principal Problem:   Empyema lung (HCC) Active Problems:   Polysubstance dependence including opioid type drug with complication, episodic abuse (HCC)   Morbid obesity with BMI of 50.0-59.9, adult (HCC)   Tobacco dependence  Acute respiratory failure with hypercarbia and hypoxia/right empyema s/p right thoracotomy on 10/09/2020/suspected OSA/OHS: Patient required intubation on 10/09/2020 and was extubated on 10/10/2020.  S/p thoracotomy.  Currently has chest tube on the right side which is at waterseal.  Managed by CT surgery.  Cultures growing MRSA.  He is on vancomycin which we will continue.  I have consulted ID for their recommendations.  Hypotension: Resolved.  Blood pressure better today.  Polysubstance abuse: Per his request, Suboxone was resumed yesterday.  He is telling me today that he has never been prescribed Suboxone however he gets it from the street whenever he can.  He tells me that current dose of Suboxone  is high for him and is requesting to bring it to heart.  I will do that.  Normocytic anemia: Hemoglobin is stable over 9.  Watch daily.  Tobacco dependence: Will need lengthy counseling.  DVT prophylaxis: SCD's Start: 10/09/20 1716   Code Status: Full Code  Family Communication: None present at bedside.  Plan of care discussed with patient in length and he verbalized understanding and agreed with it.  Status is: Inpatient  Remains inpatient appropriate because:Inpatient level of care appropriate due to severity of illness   Dispo: The patient is from: Home              Anticipated d/c is to: Home              Patient currently is not medically stable to d/c.   Difficult to place patient No        Estimated body mass index is 52.2 kg/m as calculated from the following:   Height as of this encounter: 5\' 6"  (1.676 m).   Weight as of this encounter: 146.7 kg.      Nutritional status:               Consultants:   CT surgery  Procedures:   Chest tube placement, thoracotomy and intubation  Antimicrobials:  Anti-infectives (From admission, onward)   Start     Dose/Rate Route Frequency Ordered Stop   10/13/20 1200  vancomycin (VANCOREADY) IVPB 2000 mg/400 mL        2,000 mg 200 mL/hr over 120 Minutes Intravenous Every 24 hours 10/12/20 1115  10/12/20 1215  vancomycin (VANCOREADY) IVPB 500 mg/100 mL        500 mg 100 mL/hr over 60 Minutes Intravenous  Once 10/12/20 1115 10/12/20 1329   10/12/20 0600  vancomycin (VANCOREADY) IVPB 1500 mg/300 mL  Status:  Discontinued        1,500 mg 150 mL/hr over 120 Minutes Intravenous Every 24 hours 10/12/20 0541 10/12/20 0556   10/12/20 0600  vancomycin (VANCOREADY) IVPB 1500 mg/300 mL  Status:  Discontinued        1,500 mg 150 mL/hr over 120 Minutes Intravenous Every 18 hours 10/12/20 0556 10/12/20 1115   10/11/20 1343  vancomycin variable dose per unstable renal function (pharmacist dosing)  Status:  Discontinued          Does not apply See admin instructions 10/11/20 1344 10/12/20 0556   10/10/20 0400  vancomycin (VANCOREADY) IVPB 1250 mg/250 mL  Status:  Discontinued        1,250 mg 166.7 mL/hr over 90 Minutes Intravenous Every 8 hours 10/09/20 1835 10/11/20 1344   10/09/20 2000  vancomycin (VANCOREADY) IVPB 1250 mg/250 mL  Status:  Discontinued        1,250 mg 166.7 mL/hr over 90 Minutes Intravenous Every 8 hours 10/09/20 1117 10/09/20 1835   10/09/20 1900  vancomycin (VANCOCIN) 2,500 mg in sodium chloride 0.9 % 500 mL IVPB        2,500 mg 250 mL/hr over 120 Minutes Intravenous  Once 10/09/20 1835 10/09/20 2137   10/09/20 1200  vancomycin (VANCOCIN) 2,500 mg in sodium chloride 0.9 % 500 mL IVPB  Status:  Discontinued        2,500 mg 250 mL/hr over 120 Minutes Intravenous  Once 10/09/20 1113 10/09/20 1835   10/07/20 2200  vancomycin (VANCOREADY) IVPB 2000 mg/400 mL  Status:  Discontinued        2,000 mg 200 mL/hr over 120 Minutes Intravenous Every 12 hours 10/07/20 1251 10/07/20 1437   10/07/20 2000  ceFEPIme (MAXIPIME) 2 g in sodium chloride 0.9 % 100 mL IVPB  Status:  Discontinued        2 g 200 mL/hr over 30 Minutes Intravenous Every 8 hours 10/07/20 1251 10/11/20 1342   10/07/20 1145  ceFEPIme (MAXIPIME) 2 g in sodium chloride 0.9 % 100 mL IVPB        2 g 200 mL/hr over 30 Minutes Intravenous  Once 10/07/20 1048 10/07/20 1351   10/07/20 1100  vancomycin (VANCOCIN) 2,500 mg in sodium chloride 0.9 % 500 mL IVPB  Status:  Discontinued        2,500 mg 250 mL/hr over 120 Minutes Intravenous  Once 10/07/20 1048 10/07/20 1437         Subjective: Patient seen and examined.  He feels better.  Pain controlled.  No new complaint.  Objective: Vitals:   10/12/20 1919 10/12/20 2323 10/13/20 0321 10/13/20 0805  BP: (!) 188/95 (!) 157/84 (!) 147/93 (!) 172/87  Pulse: (!) 107 97 91 86  Resp: (!) 23  Temp: 99.6 F (37.6 C) 99 F (37.2 C) 98.2 F (36.8 C) 97.8 F (36.6 C)  TempSrc: Oral  Oral Oral Oral  SpO2: 91% 94% 93% 95%  Weight:      Height:        Intake/Output Summary (Last 24 hours) at 10/13/2020 1027 Last data filed at 10/13/2020 0800 Gross per 24 hour  Intake 622.83 ml  Output 1225 ml  Net -602.17 ml   Filed Weights   10/07/20 1006  Weight: (!) 146.7 kg    Examination:  General exam: Appears calm and comfortable, morbidly obese Respiratory system: Faint rhonchi at bases bilaterally . Respiratory effort normal. Cardiovascular system: S1 & S2 heard, RRR. No JVD, murmurs, rubs, gallops or clicks. No pedal edema. Gastrointestinal system: Abdomen is nondistended, soft and nontender. No organomegaly or masses felt. Normal bowel sounds heard. Central nervous system: Alert and oriented. No focal neurological deficits. Extremities: Symmetric 5 x 5 power. Skin: No rashes, lesions or ulcers.  Psychiatry: Judgement and insight appear normal. Mood & affect appropriate.   Data Reviewed: I have personally reviewed following labs and imaging studies  CBC: Recent Labs  Lab 10/07/20 1101 10/08/20 0132 10/09/20 2021 10/10/20 0422 10/10/20 0458 10/11/20 0435 10/12/20 0435 10/13/20 0339  WBC 7.3   < > 8.2 7.7  --  6.4 6.0 6.2  NEUTROABS 5.6  --   --   --   --   --  4.0 3.7  HGB 11.0*   < > 9.7* 9.6* 9.5* 9.1* 9.4* 9.1*  HCT 33.6*   < > 30.5* 30.2* 28.0* 28.7* 29.6* 28.2*  MCV 91.8   < > 92.7 92.9  --  94.1 94.6 93.4  PLT 358   < > 324 303  --  297 299 262   < > = values in this interval not displayed.   Basic Metabolic Panel: Recent Labs  Lab 10/09/20 2021 10/10/20 0422 10/10/20 0458 10/11/20 0435 10/12/20 0435 10/13/20 0339  NA 135 134* 138 134* 133* 134*  K 5.0 5.1 4.6 4.2 4.0 4.3  CL 100 101  --  98 100 100  CO2 28 28  --  29 28 28   GLUCOSE 138* 279*  --  110* 120* 123*  BUN 13 14  --  19 16 19   CREATININE 0.87 0.81  --  0.90 0.81 0.76  CALCIUM 8.9 9.0  --  8.8* 8.6* 8.7*  MG  --  1.9  --   --   --   --    GFR: Estimated Creatinine  Clearance: 183.6 mL/min (by C-G formula based on SCr of 0.76 mg/dL). Liver Function Tests: Recent Labs  Lab 10/07/20 1101 10/11/20 0435 10/12/20 0435 10/13/20 0339  AST 29 25 27  32  ALT 17 15 17 20   ALKPHOS 128* 92 103 107  BILITOT 0.6 0.5 0.5 0.6  PROT 8.0 6.6 6.7 6.4*  ALBUMIN 2.4* 2.2* 2.2* 2.1*   No results for input(s): LIPASE, AMYLASE in the last 168 hours. No results for input(s): AMMONIA in the last 168 hours. Coagulation Profile: No results for input(s): INR, PROTIME in the last 168 hours. Cardiac Enzymes: No results for input(s): CKTOTAL, CKMB, CKMBINDEX, TROPONINI in the last 168 hours. BNP (last 3 results) No results for input(s): PROBNP in the last 8760 hours. HbA1C: No results for input(s): HGBA1C in the last 72 hours. CBG: Recent Labs  Lab 10/10/20 0455 10/10/20 0817 10/10/20 1140 10/11/20 1128 10/11/20 1559  GLUCAP 176* 115* 121* 140* 128*   Lipid Profile: No results for input(s): CHOL, HDL, LDLCALC, TRIG, CHOLHDL, LDLDIRECT in the last 72 hours. Thyroid Function Tests: No results for input(s): TSH, T4TOTAL, FREET4, T3FREE, THYROIDAB in the last 72 hours. Anemia Panel: No results for input(s): VITAMINB12, FOLATE, FERRITIN, TIBC, IRON, RETICCTPCT in the last 72 hours. Sepsis Labs: Recent Labs  Lab 10/07/20 1101 10/07/20 1412  PROCALCITON 0.47  --   LATICACIDVEN 1.1 1.7    Recent Results (from the past 240 hour(s))  Culture, blood (  routine x 2)     Status: None   Collection Time: 10/07/20 11:01 AM   Specimen: BLOOD LEFT HAND  Result Value Ref Range Status   Specimen Description BLOOD LEFT HAND  Final   Special Requests   Final    BOTTLES DRAWN AEROBIC ONLY Blood Culture results may not be optimal due to an inadequate volume of blood received in culture bottles   Culture   Final    NO GROWTH 5 DAYS Performed at [facility], [address], [place], [place] [address]    Report Status 10/12/2020 FINAL  Final  Culture, blood (routine x  2)     Status: None   Collection Time: 10/07/20 11:14 AM   Specimen: BLOOD RIGHT HAND  Result Value Ref Range Status   Specimen Description BLOOD RIGHT HAND  Final   Special Requests   Final    BOTTLES DRAWN AEROBIC ONLY Blood Culture results may not be optimal due to an inadequate volume of blood received in culture bottles   Culture   Final    NO GROWTH 5 DAYS Performed at [facility], [address] [place], [place] [address]    Report Status 10/12/2020 FINAL  Final  Surgical PCR screen     Status: Abnormal   Collection Time: 10/08/20  8:58 PM   Specimen: Nasal Mucosa; Nasal Swab  Result Value Ref Range Status   MRSA, PCR POSITIVE (A) NEGATIVE Final    Comment: RESULT CALLED TO, READ BACK BY AND VERIFIED WITH: O.P. RN 10/09/20 0015 JDW    Staphylococcus aureus POSITIVE (A) NEGATIVE Final    Comment: (NOTE) The Xpert SA Assay (FDA approved for NASAL specimens in patients 12 years of age and older), is one component of a comprehensive surveillance program. It is not intended to diagnose infection nor to guide or monitor treatment. Performed at [facility], [address], [place], [place] [address]   Aerobic/Anaerobic Culture w Gram Stain (surgical/deep wound)     Status: None (Preliminary result)   Collection Time: 10/09/20  3:46 PM   Specimen: PATH Cytology Pleural fluid; Body Fluid  Result Value Ref Range Status   Specimen Description FLUID PLEURAL RIGHT  Final   Special Requests NONE  Final   Gram Stain   Final    RARE WBC PRESENT,BOTH PMN AND MONONUCLEAR FEW GRAM POSITIVE COCCI IN PAIRS IN CLUSTERS Performed at [facility], [address] [place], [place] [address]    Culture   Final    ABUNDANT METHICILLIN RESISTANT STAPHYLOCOCCUS AUREUS NO ANAEROBES ISOLATED; CULTURE IN PROGRESS FOR 5 DAYS    Report Status PENDING  Incomplete   Organism ID, Bacteria METHICILLIN RESISTANT STAPHYLOCOCCUS AUREUS  Final      Susceptibility   Methicillin  resistant staphylococcus aureus - MIC*    CIPROFLOXACIN >=8 RESISTANT Resistant     ERYTHROMYCIN >=8 RESISTANT Resistant     GENTAMICIN <=0.5 SENSITIVE Sensitive     OXACILLIN >=4 RESISTANT Resistant     TETRACYCLINE >=16 RESISTANT Resistant     VANCOMYCIN <=0.5 SENSITIVE Sensitive     TRIMETH/SULFA <=10 SENSITIVE Sensitive     CLINDAMYCIN <=0.25 SENSITIVE Sensitive     RIFAMPIN <=0.5 SENSITIVE Sensitive     Inducible Clindamycin NEGATIVE Sensitive     * ABUNDANT METHICILLIN RESISTANT STAPHYLOCOCCUS AUREUS  Acid Fast Smear (AFB)     Status: None   Collection Time: 10/09/20  3:46 PM   Specimen: PATH Cytology Pleural fluid;  Body Fluid  Result Value Ref Range Status   AFB Specimen Processing Concentration  Final   Acid Fast Smear Negative  Final    Comment: (NOTE) Performed At: [facility] [address] [place], [place] [id number] Jolene SchimkeNagendra Sanjai MD NW:[id number]:[phone]    Source (AFB) FLUID  Final    Comment: PLEURAL RIGHT Performed at [facility], [address] [place], [place] [address]   Aerobic/Anaerobic Culture w Gram Stain (surgical/deep wound)     Status: None (Preliminary result)   Collection Time: 10/09/20  3:52 PM   Specimen: Soft Tissue, Other  Result Value Ref Range Status   Specimen Description TISSUE  Final   Special Requests PLEURAL PEEL  Final   Gram Stain   Final    RARE WBC PRESENT,BOTH PMN AND MONONUCLEAR RARE GRAM POSITIVE COCCI IN CLUSTERS Performed at [facility], [address] [place], [place] [address]    Culture   Final    MODERATE METHICILLIN RESISTANT STAPHYLOCOCCUS AUREUS NO ANAEROBES ISOLATED; CULTURE IN PROGRESS FOR 5 DAYS    Report Status PENDING  Incomplete   Organism ID, Bacteria METHICILLIN RESISTANT STAPHYLOCOCCUS AUREUS  Final      Susceptibility   Methicillin resistant staphylococcus aureus - MIC*    CIPROFLOXACIN >=8 RESISTANT Resistant     ERYTHROMYCIN >=8 RESISTANT Resistant     GENTAMICIN <=0.5 SENSITIVE  Sensitive     OXACILLIN >=4 RESISTANT Resistant     TETRACYCLINE >=16 RESISTANT Resistant     VANCOMYCIN <=0.5 SENSITIVE Sensitive     TRIMETH/SULFA <=10 SENSITIVE Sensitive     CLINDAMYCIN <=0.25 SENSITIVE Sensitive     RIFAMPIN <=0.5 SENSITIVE Sensitive     Inducible Clindamycin NEGATIVE Sensitive     * MODERATE METHICILLIN RESISTANT STAPHYLOCOCCUS AUREUS  Acid Fast Smear (AFB)     Status: None   Collection Time: 10/09/20  3:52 PM   Specimen: Soft Tissue, Other  Result Value Ref Range Status   AFB Specimen Processing Comment  Final    Comment: Tissue Grinding and Digestion/Decontamination   Acid Fast Smear Negative  Final    Comment: (NOTE) Performed At: [facility] [address] [place], [place] [id number] Jolene SchimkeNagendra Sanjai MD NU:[id number]:[phone]    Source (AFB) TISSUE  Final    Comment: PLEURAL PEEL Performed at [facility], [address] [place], [place] [address]       Radiology Studies: DG CHEST PORT 1 VIEW  Result Date: 10/13/2020 CLINICAL DATA:  Empyema.  Chest tube. EXAM: PORTABLE CHEST 1 VIEW COMPARISON:  October 12, 2020 FINDINGS: The right PICC line terminates in the SVC just below the brachiocephalic confluence. No pneumothorax. The cardiomediastinal silhouette is stable. The left lung is clear. Opacity remains in the right base. Two right-sided chest tubes remain. No other acute abnormalities. IMPRESSION: 1. Support apparatus as above. 2. Infiltrate/opacity in the right mid and lower lung could represent pneumonia, aspiration, or remaining pleural fluid/empyema with adjacent atelectasis. Recommend clinical correlation. A CT scan of the chest could better evaluate the amount of remaining pleural fluid/empyema. Electronically Signed   By: Gerome Samavid  Williams III M.D   On: 10/13/2020 08:46   DG Chest Port 1 View  Result Date: 10/12/2020 CLINICAL DATA:  Empyema. EXAM: PORTABLE CHEST 1 VIEW COMPARISON:  October 11, 2020. FINDINGS: The heart size and mediastinal  contours are within normal limits. No pneumothorax is noted. Right-sided PICC line is unchanged in position. Left lung is clear. Stable position of 2 right-sided chest tubes.  Stable right basilar atelectasis or infiltrate is noted as well as probable pleural effusion. The visualized skeletal structures are unremarkable. IMPRESSION: Stable position of 2 right-sided chest tubes as well as stable appearance of right basilar opacity concerning for atelectasis or infiltrate and effusion. Electronically Signed   By: Lupita Raider M.D.   On: 10/12/2020 07:55    Scheduled Meds: . acetaminophen  1,000 mg Oral Q6H   Or  . acetaminophen (TYLENOL) oral liquid 160 mg/5 mL  1,000 mg Per Tube Q6H  . [START ON 10/14/2020] buprenorphine-naloxone  1 tablet Sublingual BID  . Chlorhexidine Gluconate Cloth  6 each Topical Daily  . docusate sodium  100 mg Oral BID  . mouth rinse  15 mL Mouth Rinse BID  . nicotine  14 mg Transdermal Daily  . pantoprazole (PROTONIX) IV  40 mg Intravenous Daily  . polyethylene glycol  17 g Oral Daily  . senna-docusate  2 tablet Oral QHS  . sodium chloride flush  10-40 mL Intracatheter Q12H  . sodium chloride flush  3 mL Intravenous Q12H   Continuous Infusions: . sodium chloride 10 mL/hr at 10/12/20 1400  . vancomycin       LOS: 6 days   Time spent: 28 minutes   Hughie Closs, MD [facility]  10/13/2020, 10:27 AM   To contact the attending provider between 7A-7P or the covering provider during after hours 7P-7A, please log into the web site [email or web address].

## 2020-10-13 NOTE — Consult Note (Signed)
[facility]    Date of Admission:  10/07/2020     Reason for Consult: mrsa empyema    Referring Provider: Marta Lamas   Lines:  Rue picc  Abx: 4/24-c vanc  4/24-28 cefepime        Assessment: Mrsa pna/empyema   30 yo male obese ivdu (heroin) admitted with a few day sob dx'ed with right lung mrsa empyema, also had mid lower thoracic upper lumbar midline spine tenderness  4/24 blood cultures negative 4/24 hiv screen negative S/p right VATs/thoracotomy 4/26; cx mrsa (S bactrim; R doxy) 4/30 cxr Infiltrate/opacity in the right mid and lower lung could represent pneumonia, aspiration, or remaining pleural fluid/empyema with adjacent atelectasis. Recommend clinical correlation. A CT scan of the chest could better evaluate the amount of remaining pleural Fluid/empyema.  cxr reviewed. Chest tube remains in place. CTS following. Hypertensive/hemodynamics stable. No fever here. Patient on room air  Given mrsa infection/ivdu, and midline back tenderness which he said is severe, will need to get mri r/o osteomyelitis. No redflag in terms of spondylopathy at this time  Plan: 1. Agree we can transition to PO abx, pending mri lower back workup. Duration can be a moving target in this case. Once chest tube is removed would aim for 3 weeks given this is mrsa 2. Can plan for linezolid 600 mg po bid 3. Mri thoracic/lumbar spine --> If positive, will need longer antibiotics, and might need to rethink about choice of regimen vs linezolid 4. F/u CTS outpatient per their guidance 5. Please check cbc weekly (2x) while on linezolid to monitor for hematologic suppression 6. Please order a set of cbc/cmp and also cxr prior to id clinic visit 7. Id clinic f/u as below     Clinic Follow Up Appt: 5/11 @ 345 with Dr Renold Don  @  [facility] [address], [place], [place] [address] Phone: [phone]      ------------------------------------------------ Principal Problem:   Empyema lung (HCC) Active Problems:   Polysubstance dependence including opioid type drug with complication, episodic abuse (HCC)   Morbid obesity with BMI of 50.0-59.9, adult (HCC)   Tobacco dependence    HPI: Derek Woods is a 30 y.o. male pmh asthma, anxiety, substance abuse admitted 4/24 with dyspnea found to have empyema/mrsa no s/p decortication  Patient uses heroin, and last used a day prior to admission. Has been using for years  He has 2 day sx of dyspnea but 3 weeks severe mid lower back pain  He reported also 06/2020 dx'ed with aspiration over dose in setting heroin over dose. Was seen at [facility]. Discharged in a day. No abx. Had felt well since until the back pain/dyspnea within the past few weeks  No f/c No rash. Chronic skin scarring from hidradenitis  Patient had xray chest which showed right loculated empyema. uds marijuana. Started on empiric bsABX and ultimately underwent VATs/thoracotomy by cts  He is doing well since procedure No fever this admission or leuckoytosis Blood cx this admission negative hiv screen negative Wound cx mrsa   Said back pain still severe  No n/v/diarrhea No other joint pain No hardware  Currently has RUE picc    History reviewed. No pertinent family history.  Social History   Tobacco Use  . Smoking status: Current Every Day Smoker    Packs/day: 1.00    Years: 11.00    Pack years: 11.00    Types:  Cigarettes  . Smokeless tobacco: Current User  . Tobacco comment: unable to smoke; occasional use of oral tobacco  Substance Use Topics  . Alcohol use: Yes    Comment: "hardly any"  . Drug use: Yes    Types: Heroin, Marijuana    Comment: occasional marijuana, daily heroin    Allergies  Allergen Reactions  . Augmentin [Amoxicillin-Pot Clavulanate] Other (See Comments)    Pt does not recall, childhood  . Ceclor  [Cefaclor] Other (See Comments)    Pt does not recall, childhood  . Sulfa Antibiotics Hives    Review of Systems: ROS All Other ROS was negative, except mentioned above   Past Medical History:  Diagnosis Date  . Anxiety   . Asthma   . Morbid obesity with BMI of 50.0-59.9, adult (HCC)   . Polysubstance dependence including opioid type drug with complication, episodic abuse (HCC)   . Tobacco dependence        Scheduled Meds: . acetaminophen  1,000 mg Oral Q6H   Or  . acetaminophen (TYLENOL) oral liquid 160 mg/5 mL  1,000 mg Per Tube Q6H  . [START ON 10/14/2020] buprenorphine-naloxone  1 tablet Sublingual BID  . Chlorhexidine Gluconate Cloth  6 each Topical Daily  . docusate sodium  100 mg Oral BID  . mouth rinse  15 mL Mouth Rinse BID  . nicotine  14 mg Transdermal Daily  . pantoprazole (PROTONIX) IV  40 mg Intravenous Daily  . polyethylene glycol  17 g Oral Daily  . senna-docusate  2 tablet Oral QHS  . sodium chloride flush  10-40 mL Intracatheter Q12H  . sodium chloride flush  3 mL Intravenous Q12H   Continuous Infusions: . sodium chloride 10 mL/hr at 10/12/20 1400  . vancomycin     PRN Meds:.sodium chloride, albuterol, dicyclomine, hydrOXYzine, ketorolac, melatonin, methocarbamol, phenol, sodium chloride flush   OBJECTIVE: Blood pressure (!) 181/95, pulse 93, temperature 98.5 F (36.9 C), temperature source Oral, resp. rate 18, height 5\' 6"  (1.676 m), weight (!) 146.7 kg, SpO2 93 %.  Physical Exam Obese, no distress, conversant Heent: normocephalic; per; conj clear; eomi Neck supple Chest clear; normal respiratory effort. Chest tube right lung; no air leak cv rrr no mrg abd s/nt Ext no edema Skin multiple scar back (hidradenitis per patient). Thoracolumbar junction tenderness Neuro nonfocal Psych alert/oriented msk no other peripheral joint swelling/redness   Lab Results Lab Results  Component Value Date   WBC 6.2 10/13/2020   HGB 9.1 (L) 10/13/2020    HCT 28.2 (L) 10/13/2020   MCV 93.4 10/13/2020   PLT 262 10/13/2020    Lab Results  Component Value Date   CREATININE 0.76 10/13/2020   BUN 19 10/13/2020   NA 134 (L) 10/13/2020   K 4.3 10/13/2020   CL 100 10/13/2020   CO2 28 10/13/2020    Lab Results  Component Value Date   ALT 20 10/13/2020   AST 32 10/13/2020   ALKPHOS 107 10/13/2020   BILITOT 0.6 10/13/2020      Microbiology: Recent Results (from the past 240 hour(s))  Culture, blood (routine x 2)     Status: None   Collection Time: 10/07/20 11:01 AM   Specimen: BLOOD LEFT HAND  Result Value Ref Range Status   Specimen Description BLOOD LEFT HAND  Final   Special Requests   Final    BOTTLES DRAWN AEROBIC ONLY Blood Culture results may not be optimal due to an inadequate volume of blood received in culture bottles  Culture   Final    NO GROWTH 5 DAYS Performed at [facility], [address], [place], [place] [address]    Report Status 10/12/2020 FINAL  Final  Culture, blood (routine x 2)     Status: None   Collection Time: 10/07/20 11:14 AM   Specimen: BLOOD RIGHT HAND  Result Value Ref Range Status   Specimen Description BLOOD RIGHT HAND  Final   Special Requests   Final    BOTTLES DRAWN AEROBIC ONLY Blood Culture results may not be optimal due to an inadequate volume of blood received in culture bottles   Culture   Final    NO GROWTH 5 DAYS Performed at [facility], [address] [place], [place] [address]    Report Status 10/12/2020 FINAL  Final  Surgical PCR screen     Status: Abnormal   Collection Time: 10/08/20  8:58 PM   Specimen: Nasal Mucosa; Nasal Swab  Result Value Ref Range Status   MRSA, PCR POSITIVE (A) NEGATIVE Final    Comment: RESULT CALLED TO, READ BACK BY AND VERIFIED WITH: O.P. RN 10/09/20 0015 JDW    Staphylococcus aureus POSITIVE (A) NEGATIVE Final    Comment: (NOTE) The Xpert SA Assay (FDA approved for NASAL specimens in patients 35 years of age and older), is one  component of a comprehensive surveillance program. It is not intended to diagnose infection nor to guide or monitor treatment. Performed at [facility], [address], [place], [place] [address]   Aerobic/Anaerobic Culture w Gram Stain (surgical/deep wound)     Status: None (Preliminary result)   Collection Time: 10/09/20  3:46 PM   Specimen: PATH Cytology Pleural fluid; Body Fluid  Result Value Ref Range Status   Specimen Description FLUID PLEURAL RIGHT  Final   Special Requests NONE  Final   Gram Stain   Final    RARE WBC PRESENT,BOTH PMN AND MONONUCLEAR FEW GRAM POSITIVE COCCI IN PAIRS IN CLUSTERS Performed at [facility], [address], [place], [place] [address]    Culture   Final    ABUNDANT METHICILLIN RESISTANT STAPHYLOCOCCUS AUREUS NO ANAEROBES ISOLATED; CULTURE IN PROGRESS FOR 5 DAYS    Report Status PENDING  Incomplete   Organism ID, Bacteria METHICILLIN RESISTANT STAPHYLOCOCCUS AUREUS  Final      Susceptibility   Methicillin resistant staphylococcus aureus - MIC*    CIPROFLOXACIN >=8 RESISTANT Resistant     ERYTHROMYCIN >=8 RESISTANT Resistant     GENTAMICIN <=0.5 SENSITIVE Sensitive     OXACILLIN >=4 RESISTANT Resistant     TETRACYCLINE >=16 RESISTANT Resistant     VANCOMYCIN <=0.5 SENSITIVE Sensitive     TRIMETH/SULFA <=10 SENSITIVE Sensitive     CLINDAMYCIN <=0.25 SENSITIVE Sensitive     RIFAMPIN <=0.5 SENSITIVE Sensitive     Inducible Clindamycin NEGATIVE Sensitive     * ABUNDANT METHICILLIN RESISTANT STAPHYLOCOCCUS AUREUS  Acid Fast Smear (AFB)     Status: None   Collection Time: 10/09/20  3:46 PM   Specimen: PATH Cytology Pleural fluid; Body Fluid  Result Value Ref Range Status   AFB Specimen Processing Concentration  Final   Acid Fast Smear Negative  Final    Comment: (NOTE) Performed At: [facility] [address] [place], [place] [id number] Jolene Schimke MD BT:[id number]    Source (AFB) FLUID  Final    Comment:  PLEURAL RIGHT Performed at [facility], [address], [place], [place] [address]  Aerobic/Anaerobic Culture w Gram Stain (surgical/deep wound)     Status: None (Preliminary result)   Collection Time: 10/09/20  3:52 PM   Specimen: Soft Tissue, Other  Result Value Ref Range Status   Specimen Description TISSUE  Final   Special Requests PLEURAL PEEL  Final   Gram Stain   Final    RARE WBC PRESENT,BOTH PMN AND MONONUCLEAR RARE GRAM POSITIVE COCCI IN CLUSTERS Performed at [facility], [address] [place], [place] [address]    Culture   Final    MODERATE METHICILLIN RESISTANT STAPHYLOCOCCUS AUREUS NO ANAEROBES ISOLATED; CULTURE IN PROGRESS FOR 5 DAYS    Report Status PENDING  Incomplete   Organism ID, Bacteria METHICILLIN RESISTANT STAPHYLOCOCCUS AUREUS  Final      Susceptibility   Methicillin resistant staphylococcus aureus - MIC*    CIPROFLOXACIN >=8 RESISTANT Resistant     ERYTHROMYCIN >=8 RESISTANT Resistant     GENTAMICIN <=0.5 SENSITIVE Sensitive     OXACILLIN >=4 RESISTANT Resistant     TETRACYCLINE >=16 RESISTANT Resistant     VANCOMYCIN <=0.5 SENSITIVE Sensitive     TRIMETH/SULFA <=10 SENSITIVE Sensitive     CLINDAMYCIN <=0.25 SENSITIVE Sensitive     RIFAMPIN <=0.5 SENSITIVE Sensitive     Inducible Clindamycin NEGATIVE Sensitive     * MODERATE METHICILLIN RESISTANT STAPHYLOCOCCUS AUREUS  Acid Fast Smear (AFB)     Status: None   Collection Time: 10/09/20  3:52 PM   Specimen: Soft Tissue, Other  Result Value Ref Range Status   AFB Specimen Processing Comment  Final    Comment: Tissue Grinding and Digestion/Decontamination   Acid Fast Smear Negative  Final    Comment: (NOTE) Performed At: [facility] [address] [place], [place] [id number] Jolene SchimkeNagendra Sanjai MD OZ:[id number]:[phone]    Source (AFB) TISSUE  Final    Comment: PLEURAL PEEL Performed at [facility], [address] [place], [place] [address]       Serology:    Imaging: If present, new imagings (plain films, ct scans, and mri) have been personally visualized and interpreted; radiology reports have been reviewed. Decision making incorporated into the Impression / Recommendations.  4/25 cxr Loculated pleural effusion/empyema present in the right mid and lower lung.  Associated mild atelectasis in the adjacent displaced right middle and lower lobes.  4/25 tte 1. Left ventricular ejection fraction, by estimation, is 60 to 65%. The  left ventricle has normal function. The left ventricle has no regional  wall motion abnormalities. Left ventricular diastolic parameters were  normal.  2. Right ventricular systolic function is normal. The right ventricular  size is normal.  3. The mitral valve is normal in structure. No evidence of mitral valve  regurgitation. No evidence of mitral stenosis.  4. The aortic valve is normal in structure. Aortic valve regurgitation is  not visualized. No aortic stenosis is present.  5. The inferior vena cava is normal in size with greater than 50%  respiratory variability, suggesting right atrial pressure of 3 mmHg.   Raymondo Bandrung T Taleisha Kaczynski, MD [facility] [phone] pager    10/13/2020, 11:14 AM

## 2020-10-13 NOTE — Progress Notes (Addendum)
[address]       [place] [id number]             [phone]      4 Days Post-Op Procedure(s) (LRB): RIGHT VATS/ THORACOTOMY/EMPYEMA (Right) Subjective: Much less SOB than preop  Objective: Vital signs in last 24 hours: Temp:  [97.8 F (36.6 C)-99.6 F (37.6 C)] 97.8 F (36.6 C) (04/30 0805) Pulse Rate:  [86-107] 86 (04/30 0805) Cardiac Rhythm: Normal sinus rhythm (04/30 0321) Resp:  [11-29] 23 (04/30 0805) BP: (145-188)/(84-95) 172/87 (04/30 0805) SpO2:  [91 %-96 %] 95 % (04/30 0805)  Hemodynamic parameters for last 24 hours:    Intake/Output from previous day: 04/29 0701 - 04/30 0700 In: 962.5 [P.O.:480; I.V.:70.1; IV Piggyback:412.4] Out: 1730 [Urine:1600; Chest Tube:130] Intake/Output this shift: Total I/O In: 240 [P.O.:240] Out: -   General appearance: alert, cooperative and no distress Heart: regular rate and rhythm Lungs: dim right base Abdomen: benign Extremities: no edema Wound: dressing CDI  Lab Results: Recent Labs    10/12/20 0435 10/13/20 0339  WBC 6.0 6.2  HGB 9.4* 9.1*  HCT 29.6* 28.2*  PLT 299 262   BMET:  Recent Labs    10/12/20 0435 10/13/20 0339  NA 133* 134*  K 4.0 4.3  CL 100 100  CO2 28 28  GLUCOSE 120* 123*  BUN 16 19  CREATININE 0.81 0.76  CALCIUM 8.6* 8.7*    PT/INR: No results for input(s): LABPROT, INR in the last 72 hours. ABG    Component Value Date/Time   PHART 7.386 10/10/2020 0458   HCO3 28.7 (H) 10/10/2020 0458   TCO2 30 10/10/2020 0458   O2SAT 99.0 10/10/2020 0458   CBG (last 3)  Recent Labs    10/10/20 1140 10/11/20 1128 10/11/20 1559  GLUCAP 121* 140* 128*    Meds Scheduled Meds: . acetaminophen  1,000 mg Oral Q6H   Or  . acetaminophen (TYLENOL) oral liquid 160 mg/5 mL  1,000 mg Per Tube Q6H  . buprenorphine-naloxone  1 tablet Sublingual Daily  . Chlorhexidine Gluconate Cloth  6 each Topical Daily  . docusate sodium  100 mg Oral BID  . mouth rinse  15 mL Mouth  Rinse BID  . mupirocin ointment  1 application Nasal BID  . nicotine  14 mg Transdermal Daily  . pantoprazole (PROTONIX) IV  40 mg Intravenous Daily  . polyethylene glycol  17 g Oral Daily  . senna-docusate  2 tablet Oral QHS  . sodium chloride flush  10-40 mL Intracatheter Q12H  . sodium chloride flush  3 mL Intravenous Q12H   Continuous Infusions: . sodium chloride 10 mL/hr at 10/12/20 1400  . lactated ringers Stopped (10/10/20 1310)  . vancomycin     PRN Meds:.sodium chloride, albuterol, dicyclomine, hydrOXYzine, ketorolac, melatonin, methocarbamol, phenol, sodium chloride flush  Xrays DG CHEST PORT 1 VIEW  Result Date: 10/13/2020 CLINICAL DATA:  Empyema.  Chest tube. EXAM: PORTABLE CHEST 1 VIEW COMPARISON:  October 12, 2020 FINDINGS: The right PICC line terminates in the SVC just below the brachiocephalic confluence. No pneumothorax. The cardiomediastinal silhouette is stable. The left lung is clear. Opacity remains in the right base. Two right-sided chest tubes remain. No other acute abnormalities. IMPRESSION: 1. Support apparatus as above. 2. Infiltrate/opacity in the right mid and lower lung could represent pneumonia, aspiration, or remaining pleural fluid/empyema with adjacent atelectasis. Recommend clinical correlation. A CT scan of the chest could better evaluate the amount of remaining pleural fluid/empyema.  Electronically Signed   By: Gerome Sam III M.D   On: 10/13/2020 08:46   DG Chest Port 1 View  Result Date: 10/12/2020 CLINICAL DATA:  Empyema. EXAM: PORTABLE CHEST 1 VIEW COMPARISON:  October 11, 2020. FINDINGS: The heart size and mediastinal contours are within normal limits. No pneumothorax is noted. Right-sided PICC line is unchanged in position. Left lung is clear. Stable position of 2 right-sided chest tubes. Stable right basilar atelectasis or infiltrate is noted as well as probable pleural effusion. The visualized skeletal structures are unremarkable. IMPRESSION: Stable  position of 2 right-sided chest tubes as well as stable appearance of right basilar opacity concerning for atelectasis or infiltrate and effusion. Electronically Signed   By: Lupita Raider M.D.   On: 10/12/2020 07:55   Recent Results (from the past 240 hour(s))  Culture, blood (routine x 2)     Status: None   Collection Time: 10/07/20 11:01 AM   Specimen: BLOOD LEFT HAND  Result Value Ref Range Status   Specimen Description BLOOD LEFT HAND  Final   Special Requests   Final    BOTTLES DRAWN AEROBIC ONLY Blood Culture results may not be optimal due to an inadequate volume of blood received in culture bottles   Culture   Final    NO GROWTH 5 DAYS Performed at [facility], [address] [place] [address], [place], [place] [address]    Report Status 10/12/2020 FINAL  Final  Culture, blood (routine x 2)     Status: None   Collection Time: 10/07/20 11:14 AM   Specimen: BLOOD RIGHT HAND  Result Value Ref Range Status   Specimen Description BLOOD RIGHT HAND  Final   Special Requests   Final    BOTTLES DRAWN AEROBIC ONLY Blood Culture results may not be optimal due to an inadequate volume of blood received in culture bottles   Culture   Final    NO GROWTH 5 DAYS Performed at [facility], [address], [place], [place] [address]    Report Status 10/12/2020 FINAL  Final  Surgical PCR screen     Status: Abnormal   Collection Time: 10/08/20  8:58 PM   Specimen: Nasal Mucosa; Nasal Swab  Result Value Ref Range Status   MRSA, PCR POSITIVE (A) NEGATIVE Final    Comment: RESULT CALLED TO, READ BACK BY AND VERIFIED WITH: O.P. RN 10/09/20 0015 JDW    Staphylococcus aureus POSITIVE (A) NEGATIVE Final    Comment: (NOTE) The Xpert SA Assay (FDA approved for NASAL specimens in patients 84 years of age and older), is one component of a comprehensive surveillance program. It is not intended to diagnose infection nor to guide or monitor treatment. Performed at [facility], [address]  [place], [place] [address]   Aerobic/Anaerobic Culture w Gram Stain (surgical/deep wound)     Status: None (Preliminary result)   Collection Time: 10/09/20  3:46 PM   Specimen: PATH Cytology Pleural fluid; Body Fluid  Result Value Ref Range Status   Specimen Description FLUID PLEURAL RIGHT  Final   Special Requests NONE  Final   Gram Stain   Final    RARE WBC PRESENT,BOTH PMN AND MONONUCLEAR FEW GRAM POSITIVE COCCI IN PAIRS IN CLUSTERS Performed at [facility], [address], [place], [place] [address]    Culture   Final    ABUNDANT METHICILLIN RESISTANT STAPHYLOCOCCUS AUREUS NO ANAEROBES ISOLATED; CULTURE IN PROGRESS FOR 5 DAYS    Report  Status PENDING  Incomplete   Organism ID, Bacteria METHICILLIN RESISTANT STAPHYLOCOCCUS AUREUS  Final      Susceptibility   Methicillin resistant staphylococcus aureus - MIC*    CIPROFLOXACIN >=8 RESISTANT Resistant     ERYTHROMYCIN >=8 RESISTANT Resistant     GENTAMICIN <=0.5 SENSITIVE Sensitive     OXACILLIN >=4 RESISTANT Resistant     TETRACYCLINE >=16 RESISTANT Resistant     VANCOMYCIN <=0.5 SENSITIVE Sensitive     TRIMETH/SULFA <=10 SENSITIVE Sensitive     CLINDAMYCIN <=0.25 SENSITIVE Sensitive     RIFAMPIN <=0.5 SENSITIVE Sensitive     Inducible Clindamycin NEGATIVE Sensitive     * ABUNDANT METHICILLIN RESISTANT STAPHYLOCOCCUS AUREUS  Acid Fast Smear (AFB)     Status: None   Collection Time: 10/09/20  3:46 PM   Specimen: PATH Cytology Pleural fluid; Body Fluid  Result Value Ref Range Status   AFB Specimen Processing Concentration  Final   Acid Fast Smear Negative  Final    Comment: (NOTE) Performed At: [facility] [address] [place], [place] [id number] Jolene Schimke MD WL:[id number]    Source (AFB) FLUID  Final    Comment: PLEURAL RIGHT Performed at [facility], [address] [place], [place] [address]   Aerobic/Anaerobic Culture w Gram Stain (surgical/deep wound)     Status: None (Preliminary result)    Collection Time: 10/09/20  3:52 PM   Specimen: Soft Tissue, Other  Result Value Ref Range Status   Specimen Description TISSUE  Final   Special Requests PLEURAL PEEL  Final   Gram Stain   Final    RARE WBC PRESENT,BOTH PMN AND MONONUCLEAR RARE GRAM POSITIVE COCCI IN CLUSTERS Performed at [facility], [address], [place], [place] [address]    Culture   Final    MODERATE METHICILLIN RESISTANT STAPHYLOCOCCUS AUREUS NO ANAEROBES ISOLATED; CULTURE IN PROGRESS FOR 5 DAYS    Report Status PENDING  Incomplete   Organism ID, Bacteria METHICILLIN RESISTANT STAPHYLOCOCCUS AUREUS  Final      Susceptibility   Methicillin resistant staphylococcus aureus - MIC*    CIPROFLOXACIN >=8 RESISTANT Resistant     ERYTHROMYCIN >=8 RESISTANT Resistant     GENTAMICIN <=0.5 SENSITIVE Sensitive     OXACILLIN >=4 RESISTANT Resistant     TETRACYCLINE >=16 RESISTANT Resistant     VANCOMYCIN <=0.5 SENSITIVE Sensitive     TRIMETH/SULFA <=10 SENSITIVE Sensitive     CLINDAMYCIN <=0.25 SENSITIVE Sensitive     RIFAMPIN <=0.5 SENSITIVE Sensitive     Inducible Clindamycin NEGATIVE Sensitive     * MODERATE METHICILLIN RESISTANT STAPHYLOCOCCUS AUREUS  Acid Fast Smear (AFB)     Status: None   Collection Time: 10/09/20  3:52 PM   Specimen: Soft Tissue, Other  Result Value Ref Range Status   AFB Specimen Processing Comment  Final    Comment: Tissue Grinding and Digestion/Decontamination   Acid Fast Smear Negative  Final    Comment: (NOTE) Performed At: [facility] [address] [place], [place] [id number] Jolene Schimke MD OV:[id number]    Source (AFB) TISSUE  Final    Comment: PLEURAL PEEL Performed at [facility], [address] [place], [place] [address]    Assessment/Plan: S/P Procedure(s) (LRB): RIGHT VATS/ THORACOTOMY/EMPYEMA (Right)    1 Tmax 99.6, hypertensive with SBP 140's to 180's- no history except when has had anxiety attacks- will follow and defer to medicine  service if they want to add meds 2 sats good on  RA 3 no leukocytosis, MRSA- s to [facility]- continue. Some oral ABX it is sensitive to, defer to medicine service but will need to transition to po at d/c or arrange IV home abx- + h/o polysubstance abuse 4 H/H fairly stable 5 normal renal fxn 6 poor albumin stores 7 normal LFT's 8 CXR very stable in appearance, no pntx. CT with 130 cc drainage,no air leak,  cont for today, appears serosanguinous    LOS: 6 days    Rowe Clack PA-C Pager [phone] 10/13/2020   Agree with above. We will keep chest tubes for now. Continue pulmonary toilet.  Han Vejar Keane Scrape

## 2020-10-14 ENCOUNTER — Inpatient Hospital Stay (HOSPITAL_COMMUNITY): Payer: Self-pay

## 2020-10-14 LAB — CBC WITH DIFFERENTIAL/PLATELET
Abs Immature Granulocytes: 0.06 10*3/uL (ref 0.00–0.07)
Basophils Absolute: 0 10*3/uL (ref 0.0–0.1)
Basophils Relative: 1 %
Eosinophils Absolute: 0.3 10*3/uL (ref 0.0–0.5)
Eosinophils Relative: 5 %
HCT: 30.7 % — ABNORMAL LOW (ref 39.0–52.0)
Hemoglobin: 9.7 g/dL — ABNORMAL LOW (ref 13.0–17.0)
Immature Granulocytes: 1 %
Lymphocytes Relative: 26 %
Lymphs Abs: 1.9 10*3/uL (ref 0.7–4.0)
MCH: 29.8 pg (ref 26.0–34.0)
MCHC: 31.6 g/dL (ref 30.0–36.0)
MCV: 94.5 fL (ref 80.0–100.0)
Monocytes Absolute: 0.2 10*3/uL (ref 0.1–1.0)
Monocytes Relative: 3 %
Neutro Abs: 4.8 10*3/uL (ref 1.7–7.7)
Neutrophils Relative %: 64 %
Platelets: 312 10*3/uL (ref 150–400)
RBC: 3.25 MIL/uL — ABNORMAL LOW (ref 4.22–5.81)
RDW: 14 % (ref 11.5–15.5)
WBC: 7.2 10*3/uL (ref 4.0–10.5)
nRBC: 0 % (ref 0.0–0.2)

## 2020-10-14 LAB — PROTIME-INR
INR: 1 (ref 0.8–1.2)
Prothrombin Time: 13.5 seconds (ref 11.4–15.2)

## 2020-10-14 IMAGING — DX DG CHEST 1V PORT
1 series · 1 of 1 positions shown · non-contrast
Comparison: [DATE]

CLINICAL DATA: Post chest tube removal

EXAM:
PORTABLE CHEST 1 VIEW

[chest]
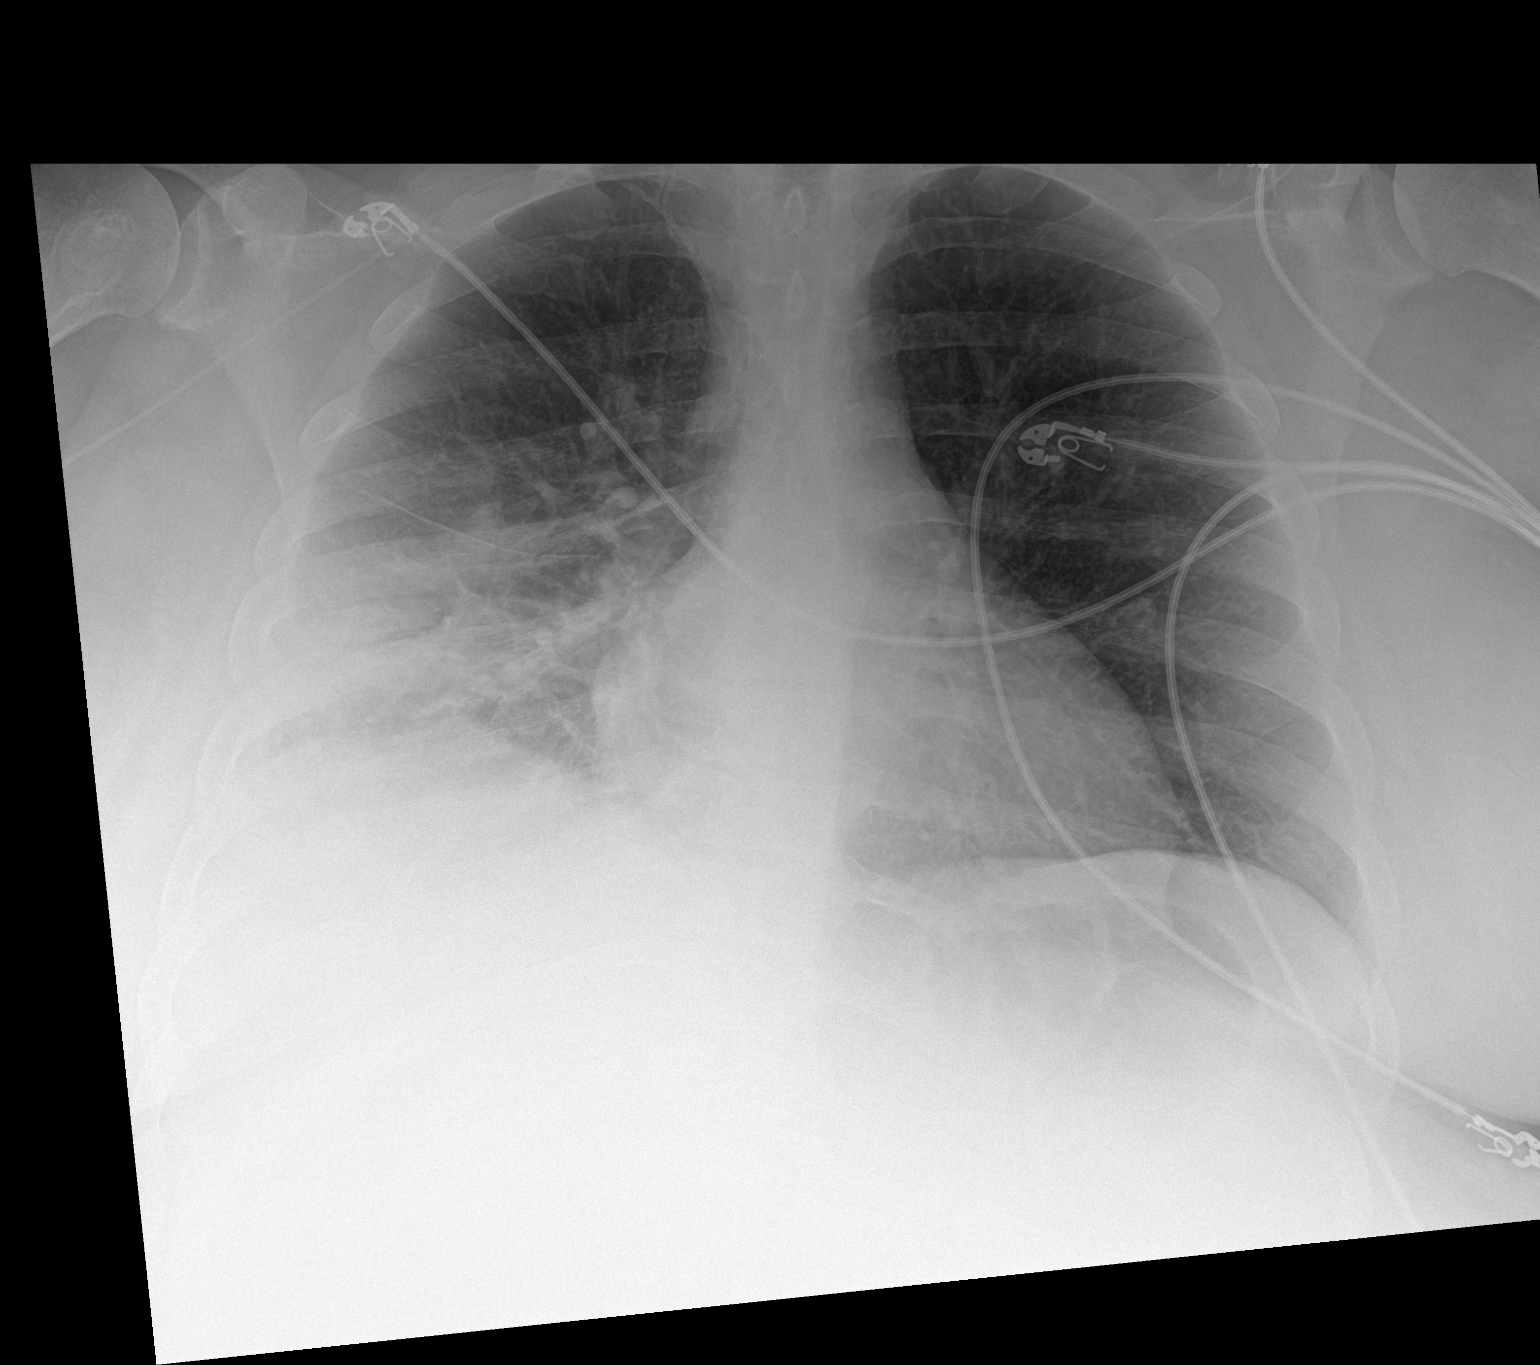

[1 of 1 positions shown; findings below may reference images not displayed]

FINDINGS: Infiltrate in the right base is stable. The right chest tube is been
removed. No pneumothorax. The cardiomediastinal silhouette is
stable. The right PICC line is stable, in good position. The left
lung is clear.
IMPRESSION: 1. No pneumothorax after right chest tube removal.
2. Continued opacity/infiltrate in the right base.

## 2020-10-14 IMAGING — MR MR THORACIC SPINE W/O CM
6 of 11 series · 26 of 48 positions shown · non-contrast
Comparison: Prior CT from [DATE]

CLINICAL DATA: Initial evaluation for acute MRSA infection.

EXAM:
MRI THORACIC SPINE WITHOUT CONTRAST
TECHNIQUE: Multiplanar, multisequence MR imaging of the thoracic spine was
performed. No intravenous contrast was administered.

[Series 16: T1 · sagittal · 3.0mm · 0.62mm/px · 2 of 11 slices shown (1 of 2)]
[im 1/11]
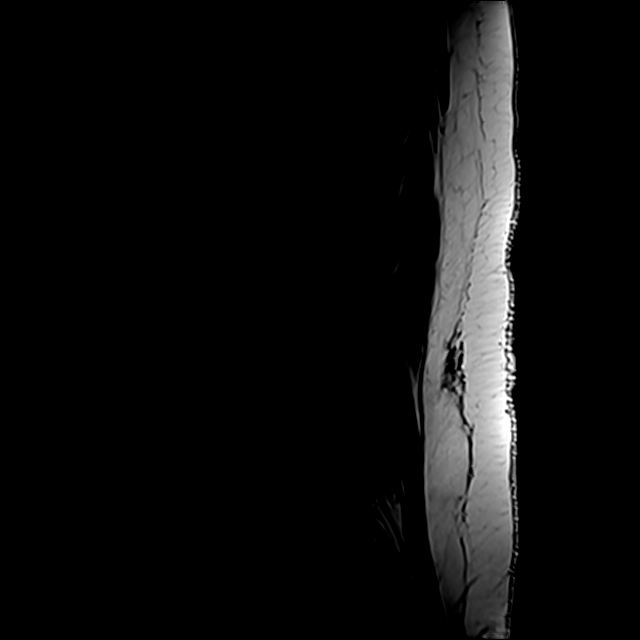
[im 11/11]
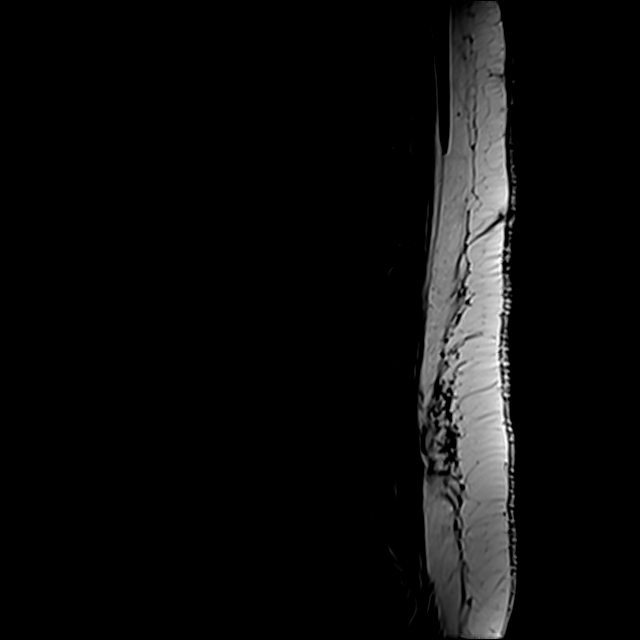

[Series 17: T1 · sagittal · 3.0mm · 0.62mm/px · 2 of 11 slices shown (2 of 2)]
[im 1/11]
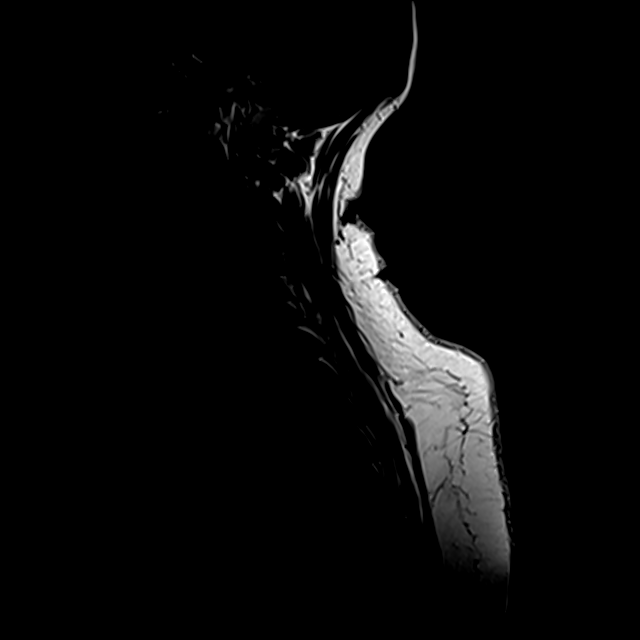
[im 11/11]
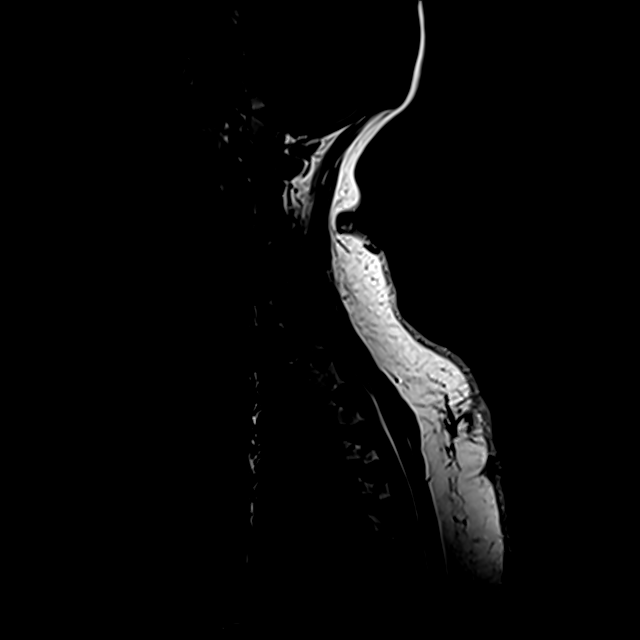

[Series 19: T2 · sagittal · 3.0mm · 0.76mm/px · 4 of 19 slices shown (1 of 4)]
[im 1/19]
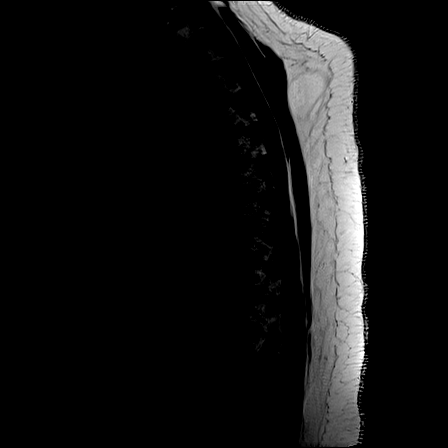
[im 7/19]
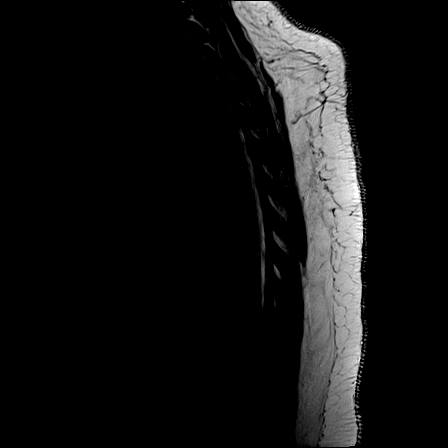
[im 13/19]
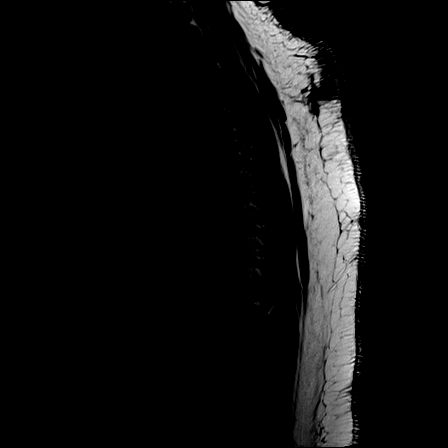
[im 19/19]
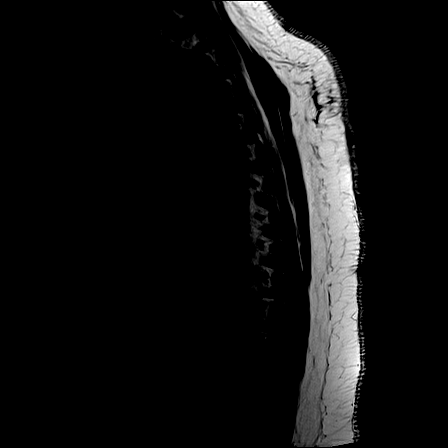

[Series 22: T2 · axial · 5.0mm · 0.66mm/px · z∈[-106,+123]mm · 7 of 32 slices shown (2 of 4)]
[im 1/32]
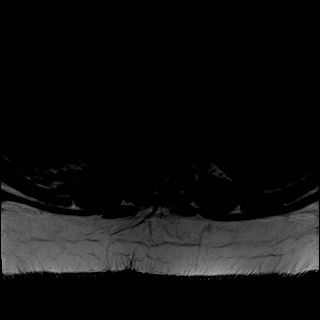
[im 6/32]
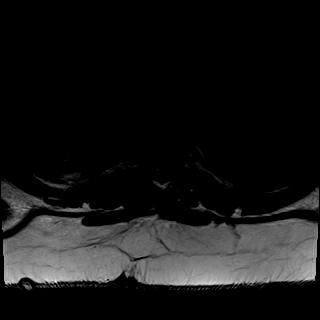
[im 11/32]
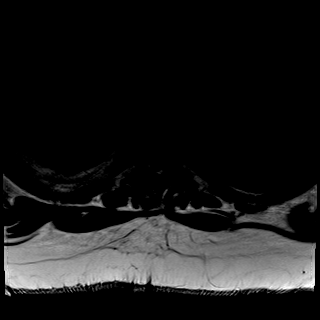
[im 16/32]
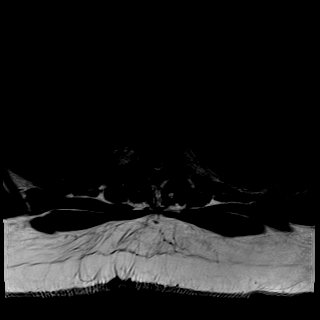
[im 21/32]
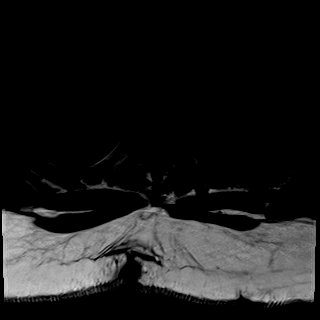
[im 26/32]
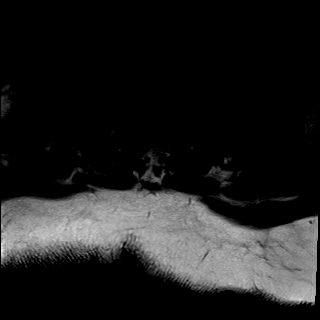
[im 32/32]
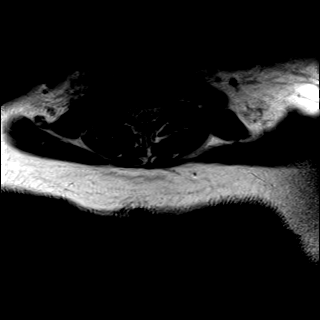

[Series 23: T2 · axial · 5.0mm · 0.66mm/px · z∈[-200,-88]mm · 3 of 16 slices shown (3 of 4)]
[im 1/16]
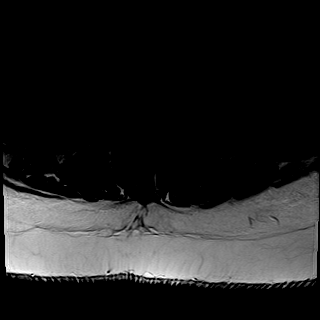
[im 8/16]
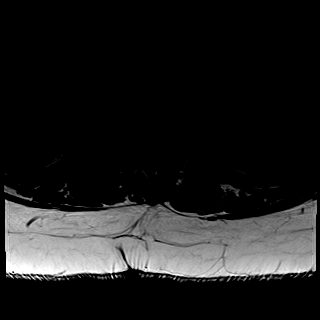
[im 16/16]
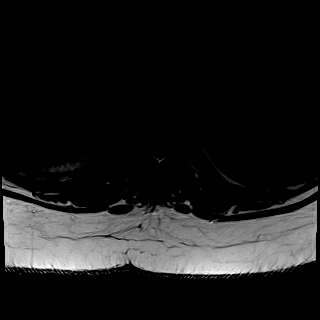

[Series 24: T2 · axial · 5.0mm · 0.66mm/px · z∈[-200,+123]mm · 8 of 48 slices shown (4 of 4)]
[im 1/48]
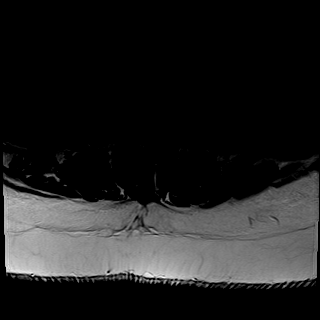
[im 6/48]
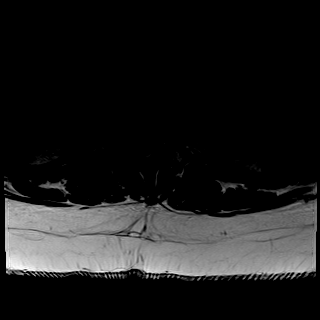
[im 16/48]
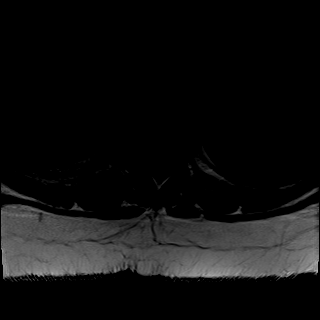
[im 21/48]
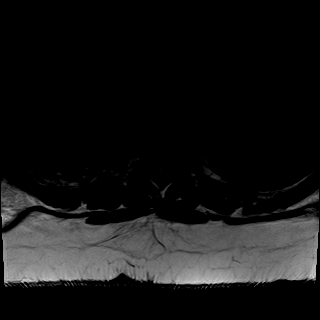
[im 27/48]
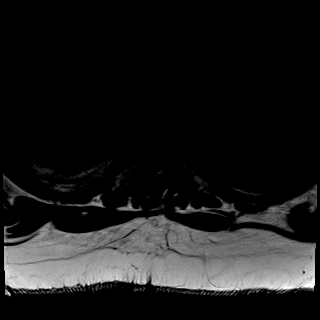
[im 32/48]
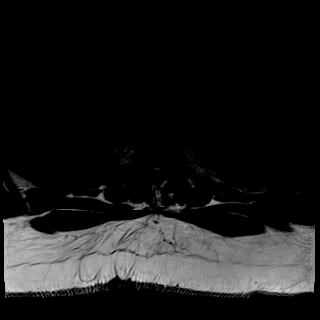
[im 42/48]
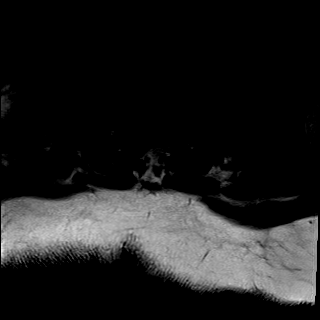
[im 48/48]
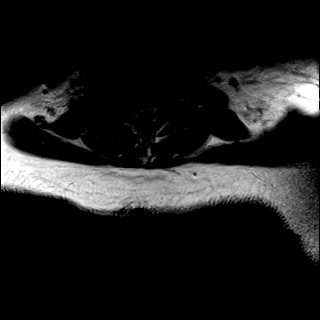

[26 of 48 positions shown; findings below may reference images not displayed]

FINDINGS: Alignment: Examination degraded by motion artifact.

Physiologic with preservation of the normal thoracic kyphosis. No
listhesis.

Vertebrae: Vertebral body height maintained without acute or chronic
fracture. Bone marrow signal intensity diffusely decreased on T1
weighted imaging, nonspecific, but most commonly related to anemia,
smoking, or obesity. No discrete or worrisome osseous lesions.

Signal changes consistent with osteomyelitis discitis seen about the
T11-12 interspace. There is associated disc space height loss with
mild endplate irregularity and reactive marrow edema. Associated
epidural abscess within the right ventral epidural space measures
approximately 0.7 x 1.6 x 2.1 cm (AP by transverse by craniocaudad)
(series 24, image 40). Inferior extension to nearly the level of the
T12-L1 interspace inferiorly. Secondary compression of the distal
thoracic cord which is flattened and deviated to the left and
posteriorly. Secondary severe spinal stenosis. No definite cord
signal changes. Foramina remain grossly patent.

Cord: Signal intensity within the thoracic spinal cord grossly
within normal limits on this motion degraded exam. No definite cord
signal changes.

Paraspinal and other soft tissues: Mild paraspinous edema adjacent
to the T11-12 interspace. No loculated paraspinous collections.
Complex right pleural effusion with associated atelectasis and/or
consolidation partially visualize, better evaluated on prior chest
CT. 9 mm cystic collection just to the right of midline in the
subcutaneous fat at the level of T3-4 noted, likely a sebaceous cyst
(series 25, image 13). An additional probable 1.2 cm sebaceous cyst
noted at the level of the lower cervical spine (series 25, image 3).

Disc levels:

T1-2:  Unremarkable.

T2-3: Unremarkable.

T3-4: Left paracentral to subarticular disc protrusion indents the
left ventral thecal sac. Mild flattening of the left ventral cord.
Bilateral facet hypertrophy. No significant spinal stenosis.
Foramina remain grossly patent.

T4-5: Mild disc bulge with facet hypertrophy. No significant spinal
stenosis. Foramina remain grossly patent.

T5-6:  Negative interspace.  Mild facet hypertrophy.  No stenosis.

T6-7: Small right foraminal disc protrusion. No significant spinal
stenosis. Foramina remain patent.

T7-8: Mild disc bulge with superimposed right paracentral to
subarticular disc protrusion. Mild facet hypertrophy. Flattening of
the right ventral thecal sac without significant spinal stenosis.
Foramina remain patent.

T8-9: Mild disc bulge, asymmetric to the left. Mild facet
hypertrophy. No significant spinal stenosis. Foramina remain patent.

T9-10: Small left paracentral disc protrusion flattens the left
ventral thecal sac. Mild facet hypertrophy. No significant spinal
stenosis. Mild left foraminal narrowing. Right neural foramina
remains patent.

T10-11: Central disc protrusion flattens and indents the ventral
thecal sac, slightly eccentric to the left (series 24, image
thirty-six). Mild facet hypertrophy. Resultant mild spinal stenosis
with minimal cord flattening, but no cord signal changes. Foramina
remain patent.

T11-12: Changes concerning for osteomyelitis discitis. 0.7 x 1.6 x
2.1 cm epidural abscess within the right ventral epidural space.
Secondary compression of the distal thoracic cord which is flattened
and slightly deviated posteriorly and to the left. No appreciable
cord signal changes. Severe spinal stenosis with the thecal sac
measuring 5 mm in AP diameter at its most narrow point. Epidural
phlegmon and/or abscess likely extends laterally into the right
neural foramen. No significant foraminal stenosis.

T12-L1:  Negative.
IMPRESSION: 1. Findings consistent with osteomyelitis discitis at T11-12.
Associated epidural abscess within the right ventral epidural space
measuring 0.7 x 1.6 x 2.1 cm, with resultant severe spinal stenosis
and compression of the distal thoracic cord. No appreciable cord
signal changes.
2. Complex right pleural effusion/empyema with associated
atelectasis and/or consolidation, better evaluated on prior chest
CT.
3. Additional multilevel degenerative spondylosis and disc
protrusions as above, with resultant mild spinal stenosis at T10-11.

Critical Value/emergent results were called by telephone at the time
of interpretation on [DATE] at [DATE] to provider Dr. THIDAR, Who
verbally acknowledged these results.

## 2020-10-14 IMAGING — DX DG CHEST 1V PORT
1 series · 1 of 1 positions shown · non-contrast
Comparison: [DATE]

CLINICAL DATA: Patient reports surgery of empyema on [DATE].

EXAM:
PORTABLE CHEST 1 VIEW

[chest]
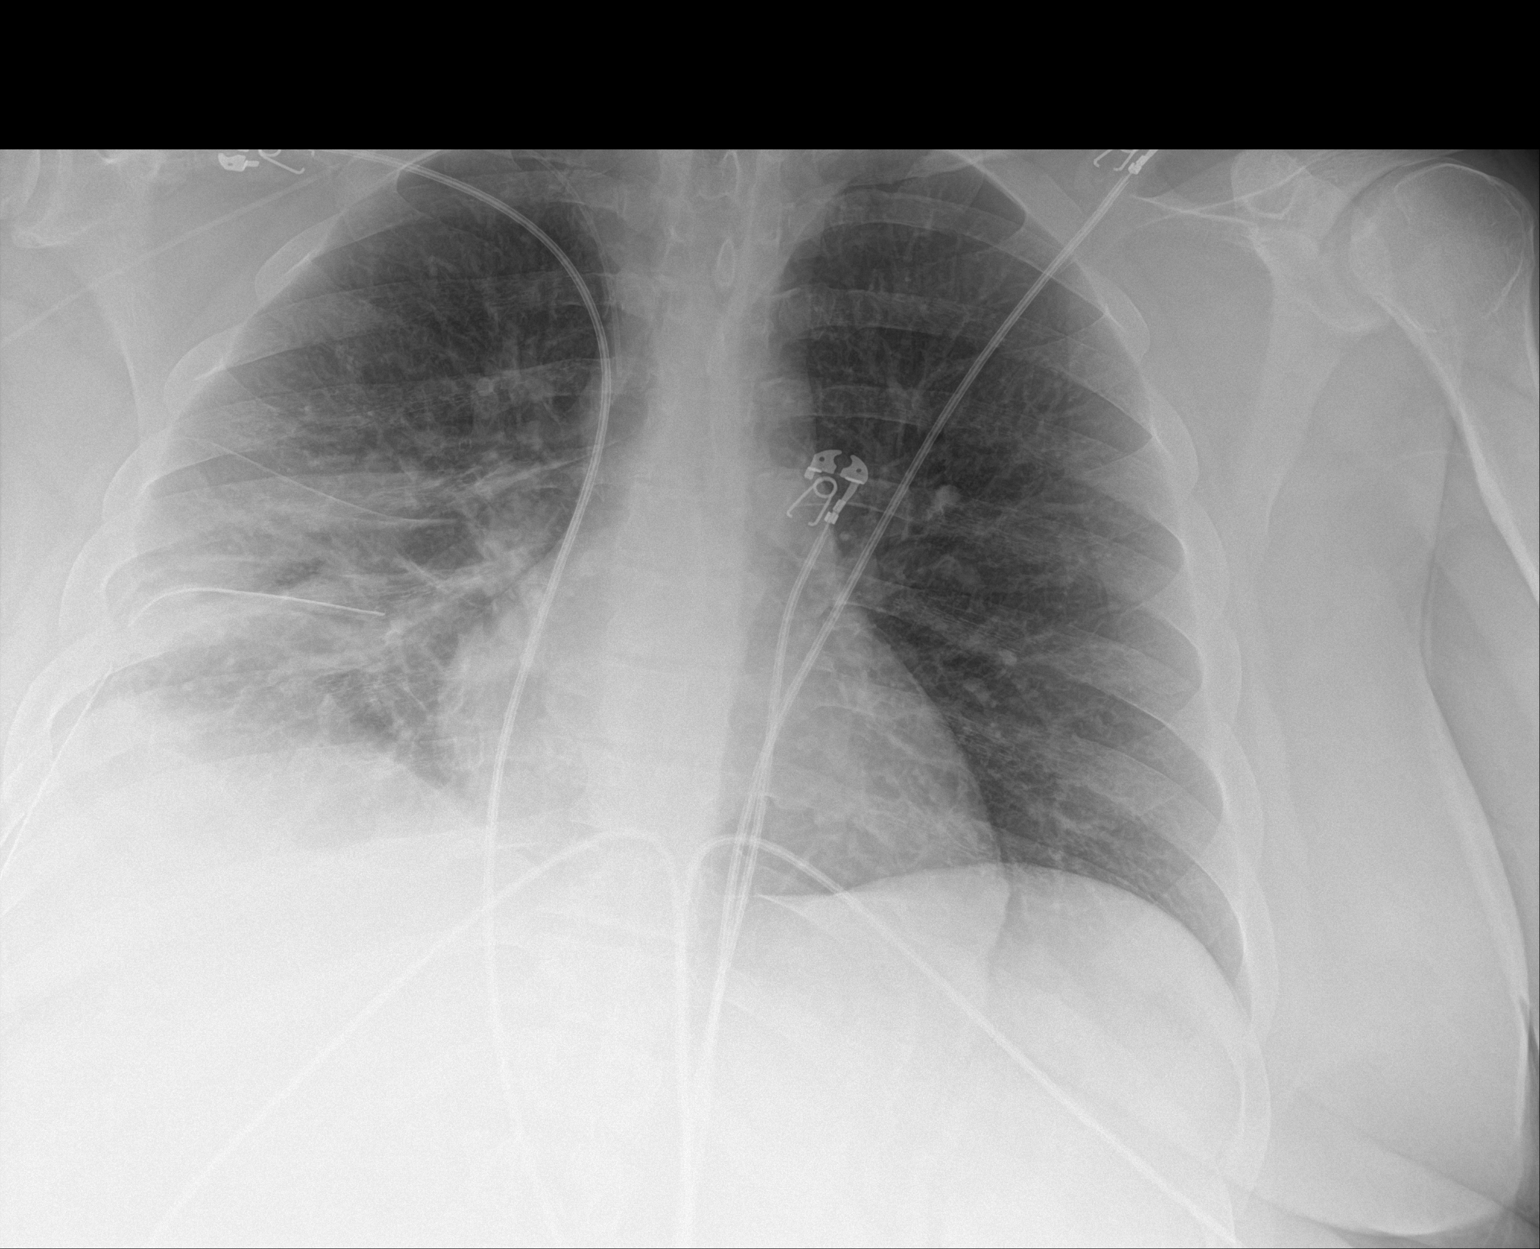

[1 of 1 positions shown; findings below may reference images not displayed]

FINDINGS: The right chest tube is stable. No pneumothorax. Opacity remains in
the right mid and lower lung, similar in the interval. No other
interval changes or acute abnormalities. The cardiomediastinal
silhouette is stable. The left lung is clear. The right PICC line
terminates in the SVC just below the brachiocephalic confluence.
IMPRESSION: 1. Side port as above.  Right chest tube without pneumothorax.
2. Opacity remains in the right mid and lower lung, unchanged.

## 2020-10-14 MED ORDER — HYDRALAZINE HCL 20 MG/ML IJ SOLN: 10.0000 mg | Freq: Four times a day (QID) | INTRAMUSCULAR | Status: DC | PRN

## 2020-10-14 MED ORDER — AMLODIPINE BESYLATE 5 MG PO TABS
5.0000 mg | ORAL_TABLET | Freq: Every day | ORAL | Status: DC
  Administered 2020-10-14 – 2020-11-27 (×44): 5 mg via ORAL
  Filled 2020-10-14 (×45): qty 1

## 2020-10-14 NOTE — Progress Notes (Signed)
PROGRESS NOTE    Derek Woods  NKN:[id number] DOB: 1990/11/29 DOA: 10/07/2020 PCP: Pcp, No   Brief Narrative:  [name] is a 30 y.o. M, with a pertinent pmx of daily heroin IVDA (previous suboxone use), asthma, morbid obesity (BMI 52), 1 PPD smoking, possible OSA, presented to [facility] on 4/24 with complaints of back pain, right flank pain, and one week with shortness of breath . CT of his chest revealed a large right empyema. He was admitted to [facility] by the hospitalist team.  He was taken to the OR on 4/26 with Dr. Laneta Simmers for a VATS and drainage of right empyema, however a right thoracotomy had to be prefomed. He was a difficult intubation for the procedure and he was unable to be weaned from the ventilator post operatively. There was reported difficulty with oxygenation during the procedure. PCCM was consulted for ventilator management who assumed care as primary team.  Patient was extubated on 10/10/2020 and was transferred under [facility] care on 10/11/2020.  Posterior chest tube was removed on 10/12/2020.  Thoracotomy culture growing MRSA.  Antibiotics de-escalated to vancomycin.  ID consulted on 10/13/2020.  MRI thoracic spine was done which showed T11/T12 discitis and possible abscess.   Assessment & Plan:   Principal Problem:   Empyema lung (HCC) Active Problems:   Polysubstance dependence including opioid type drug with complication, episodic abuse (HCC)   Morbid obesity with BMI of 50.0-59.9, adult (HCC)   Tobacco dependence   MRSA infection  Acute respiratory failure with hypercarbia and hypoxia/right empyema s/p right thoracotomy on 10/09/2020/suspected OSA/OHS: Patient required intubation on 10/09/2020 and was extubated on 10/10/2020.  S/p thoracotomy.  Currently has chest tube on the right side which is at waterseal.  Managed by CT surgery.  Cultures growing MRSA.  He is on vancomycin which we will continue.   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  He has  been focused on discussing about Suboxone with me every day.  Only after asking direct questions today, he endorsed having low back pain.  On exam has low back tenderness at the level of upper lumbar vertebrae.  He is going to get lumbar vertebral MRI as well.  Neurosurgery aware and plans to do laminectomy tomorrow.  ID also aware.  Hypotension: Blood pressure remains elevated.  Will start on amlodipine 5 mg scheduled and as needed IV hydralazine.  Polysubstance abuse: Continue Suboxone at current dose.  Normocytic anemia: Hemoglobin is stable over 9.  Watch daily.  Tobacco dependence: Will need lengthy counseling.  DVT prophylaxis: SCD's Start: 10/09/20 1716   Code Status: Full Code  Family Communication: None present at bedside.  Plan of care discussed with patient in length and he verbalized understanding and agreed with it.  Status is: Inpatient  Remains inpatient appropriate because:Inpatient level of care appropriate due to severity of illness   Dispo: The patient is from: Home              Anticipated d/c is to: Home              Patient currently is not medically stable to d/c.   Difficult to place patient No        Estimated body mass index is 52.2 kg/m as calculated from the following:   Height as of this encounter: 5\' 6"  (1.676 m).   Weight as of this encounter: 146.7 kg.      Nutritional status:  Consultants:   CT surgery  ID  Procedures:   Chest tube placement, thoracotomy and intubation  Antimicrobials:  Anti-infectives (From admission, onward)   Start     Dose/Rate Route Frequency Ordered Stop   10/13/20 1200  vancomycin (VANCOREADY) IVPB 2000 mg/400 mL        2,000 mg 200 mL/hr over 120 Minutes Intravenous Every 24 hours 10/12/20 1115     10/12/20 1215  vancomycin (VANCOREADY) IVPB 500 mg/100 mL        500 mg 100 mL/hr over 60 Minutes Intravenous  Once 10/12/20 1115 10/12/20 1329   10/12/20 0600  vancomycin  (VANCOREADY) IVPB 1500 mg/300 mL  Status:  Discontinued        1,500 mg 150 mL/hr over 120 Minutes Intravenous Every 24 hours 10/12/20 0541 10/12/20 0556   10/12/20 0600  vancomycin (VANCOREADY) IVPB 1500 mg/300 mL  Status:  Discontinued        1,500 mg 150 mL/hr over 120 Minutes Intravenous Every 18 hours 10/12/20 0556 10/12/20 1115   10/11/20 1343  vancomycin variable dose per unstable renal function (pharmacist dosing)  Status:  Discontinued         Does not apply See admin instructions 10/11/20 1344 10/12/20 0556   10/10/20 0400  vancomycin (VANCOREADY) IVPB 1250 mg/250 mL  Status:  Discontinued        1,250 mg 166.7 mL/hr over 90 Minutes Intravenous Every 8 hours 10/09/20 1835 10/11/20 1344   10/09/20 2000  vancomycin (VANCOREADY) IVPB 1250 mg/250 mL  Status:  Discontinued        1,250 mg 166.7 mL/hr over 90 Minutes Intravenous Every 8 hours 10/09/20 1117 10/09/20 1835   10/09/20 1900  vancomycin (VANCOCIN) 2,500 mg in sodium chloride 0.9 % 500 mL IVPB        2,500 mg 250 mL/hr over 120 Minutes Intravenous  Once 10/09/20 1835 10/09/20 2137   10/09/20 1200  vancomycin (VANCOCIN) 2,500 mg in sodium chloride 0.9 % 500 mL IVPB  Status:  Discontinued        2,500 mg 250 mL/hr over 120 Minutes Intravenous  Once 10/09/20 1113 10/09/20 1835   10/07/20 2200  vancomycin (VANCOREADY) IVPB 2000 mg/400 mL  Status:  Discontinued        2,000 mg 200 mL/hr over 120 Minutes Intravenous Every 12 hours 10/07/20 1251 10/07/20 1437   10/07/20 2000  ceFEPIme (MAXIPIME) 2 g in sodium chloride 0.9 % 100 mL IVPB  Status:  Discontinued        2 g 200 mL/hr over 30 Minutes Intravenous Every 8 hours 10/07/20 1251 10/11/20 1342   10/07/20 1145  ceFEPIme (MAXIPIME) 2 g in sodium chloride 0.9 % 100 mL IVPB        2 g 200 mL/hr over 30 Minutes Intravenous  Once 10/07/20 1048 10/07/20 1351   10/07/20 1100  vancomycin (VANCOCIN) 2,500 mg in sodium chloride 0.9 % 500 mL IVPB  Status:  Discontinued        2,500  mg 250 mL/hr over 120 Minutes Intravenous  Once 10/07/20 1048 10/07/20 1437         Subjective: Seen and examined.  Complains of low back pain.  No shortness of breath or any other complaint.  Objective: Vitals:   10/13/20 1908 10/13/20 2322 10/14/20 0402 10/14/20 0735  BP: (!) 147/81 (!) 144/88 (!) 141/79 (!) 156/93  Pulse: (!) 102 100 89 90  Resp: 20 20 20  (!) 24  Temp: 99.3 F (37.4 C) 98.9 F (37.2  C) 98.4 F (36.9 C) 98.5 F (36.9 C)  TempSrc: Oral Oral Oral Oral  SpO2: 93% 95% 98% 98%  Weight:      Height:        Intake/Output Summary (Last 24 hours) at 10/14/2020 [id number] Last data filed at 10/14/2020 0600 Gross per 24 hour  Intake 1000 ml  Output 2215 ml  Net -1215 ml   Filed Weights   10/07/20 1006  Weight: (!) 146.7 kg    Examination:  General exam: Appears calm and comfortable, morbidly obese Respiratory system: Clear to auscultation. Respiratory effort normal.  Chest tube inserted at the right side. Cardiovascular system: S1 & S2 heard, RRR. No JVD, murmurs, rubs, gallops or clicks. No pedal edema. Gastrointestinal system: Abdomen is nondistended, soft and nontender. No organomegaly or masses felt. Normal bowel sounds heard. Central nervous system: Alert and oriented. No focal neurological deficits. Extremities: Symmetric 5 x 5 power.  Low back midline tenderness. Skin: No rashes, lesions or ulcers.  Psychiatry: Judgement and insight appear poor  Data Reviewed: I have personally reviewed following labs and imaging studies  CBC: Recent Labs  Lab 10/07/20 1101 10/08/20 0132 10/10/20 0422 10/10/20 0458 10/11/20 0435 10/12/20 0435 10/13/20 0339 10/14/20 0331  WBC 7.3   < > 7.7  --  6.4 6.0 6.2 7.2  NEUTROABS 5.6  --   --   --   --  4.0 3.7 4.8  HGB 11.0*   < > 9.6* 9.5* 9.1* 9.4* 9.1* 9.7*  HCT 33.6*   < > 30.2* 28.0* 28.7* 29.6* 28.2* 30.7*  MCV 91.8   < > 92.9  --  94.1 94.6 93.4 94.5  PLT 358   < > 303  --  297 299 262 312   < > = values in this  interval not displayed.   Basic Metabolic Panel: Recent Labs  Lab 10/09/20 2021 10/10/20 0422 10/10/20 0458 10/11/20 0435 10/12/20 0435 10/13/20 0339  NA 135 134* 138 134* 133* 134*  K 5.0 5.1 4.6 4.2 4.0 4.3  CL 100 101  --  98 100 100  CO2 28 28  --  29 28 28   GLUCOSE 138* 279*  --  110* 120* 123*  BUN 13 14  --  19 16 19   CREATININE 0.87 0.81  --  0.90 0.81 0.76  CALCIUM 8.9 9.0  --  8.8* 8.6* 8.7*  MG  --  1.9  --   --   --   --    GFR: Estimated Creatinine Clearance: 183.6 mL/min (by C-G formula based on SCr of 0.76 mg/dL). Liver Function Tests: Recent Labs  Lab 10/07/20 1101 10/11/20 0435 10/12/20 0435 10/13/20 0339  AST 29 25 27  32  ALT 17 15 17 20   ALKPHOS 128* 92 103 107  BILITOT 0.6 0.5 0.5 0.6  PROT 8.0 6.6 6.7 6.4*  ALBUMIN 2.4* 2.2* 2.2* 2.1*   No results for input(s): LIPASE, AMYLASE in the last 168 hours. No results for input(s): AMMONIA in the last 168 hours. Coagulation Profile: No results for input(s): INR, PROTIME in the last 168 hours. Cardiac Enzymes: No results for input(s): CKTOTAL, CKMB, CKMBINDEX, TROPONINI in the last 168 hours. BNP (last 3 results) No results for input(s): PROBNP in the last 8760 hours. HbA1C: No results for input(s): HGBA1C in the last 72 hours. CBG: Recent Labs  Lab 10/10/20 0455 10/10/20 0817 10/10/20 1140 10/11/20 1128 10/11/20 1559  GLUCAP 176* 115* 121* 140* 128*   Lipid Profile: No results for input(s):  CHOL, HDL, LDLCALC, TRIG, CHOLHDL, LDLDIRECT in the last 72 hours. Thyroid Function Tests: No results for input(s): TSH, T4TOTAL, FREET4, T3FREE, THYROIDAB in the last 72 hours. Anemia Panel: No results for input(s): VITAMINB12, FOLATE, FERRITIN, TIBC, IRON, RETICCTPCT in the last 72 hours. Sepsis Labs: Recent Labs  Lab 10/07/20 1101 10/07/20 1412  PROCALCITON 0.47  --   LATICACIDVEN 1.1 1.7    Recent Results (from the past 240 hour(s))  Culture, blood (routine x 2)     Status: None    Collection Time: 10/07/20 11:01 AM   Specimen: BLOOD LEFT HAND  Result Value Ref Range Status   Specimen Description BLOOD LEFT HAND  Final   Special Requests   Final    BOTTLES DRAWN AEROBIC ONLY Blood Culture results may not be optimal due to an inadequate volume of blood received in culture bottles   Culture   Final    NO GROWTH 5 DAYS Performed at [facility], [address] [place], [place] [address]    Report Status 10/12/2020 FINAL  Final  Culture, blood (routine x 2)     Status: None   Collection Time: 10/07/20 11:14 AM   Specimen: BLOOD RIGHT HAND  Result Value Ref Range Status   Specimen Description BLOOD RIGHT HAND  Final   Special Requests   Final    BOTTLES DRAWN AEROBIC ONLY Blood Culture results may not be optimal due to an inadequate volume of blood received in culture bottles   Culture   Final    NO GROWTH 5 DAYS Performed at [facility], [address], [place], [place] [address]    Report Status 10/12/2020 FINAL  Final  Surgical PCR screen     Status: Abnormal   Collection Time: 10/08/20  8:58 PM   Specimen: Nasal Mucosa; Nasal Swab  Result Value Ref Range Status   MRSA, PCR POSITIVE (A) NEGATIVE Final    Comment: RESULT CALLED TO, READ BACK BY AND VERIFIED WITH: O.P. RN 10/09/20 0015 JDW    Staphylococcus aureus POSITIVE (A) NEGATIVE Final    Comment: (NOTE) The Xpert SA Assay (FDA approved for NASAL specimens in patients 38 years of age and older), is one component of a comprehensive surveillance program. It is not intended to diagnose infection nor to guide or monitor treatment. Performed at [facility], [address] [place], [place] [address]   Aerobic/Anaerobic Culture w Gram Stain (surgical/deep wound)     Status: None (Preliminary result)   Collection Time: 10/09/20  3:46 PM   Specimen: PATH Cytology Pleural fluid; Body Fluid  Result Value Ref Range Status   Specimen Description FLUID PLEURAL RIGHT  Final   Special  Requests NONE  Final   Gram Stain   Final    RARE WBC PRESENT,BOTH PMN AND MONONUCLEAR FEW GRAM POSITIVE COCCI IN PAIRS IN CLUSTERS Performed at [facility], [address], [place], [place] [address]    Culture   Final    ABUNDANT METHICILLIN RESISTANT STAPHYLOCOCCUS AUREUS NO ANAEROBES ISOLATED; CULTURE IN PROGRESS FOR 5 DAYS    Report Status PENDING  Incomplete   Organism ID, Bacteria METHICILLIN RESISTANT STAPHYLOCOCCUS AUREUS  Final      Susceptibility   Methicillin resistant staphylococcus aureus - MIC*    CIPROFLOXACIN >=8 RESISTANT Resistant     ERYTHROMYCIN >=8 RESISTANT Resistant     GENTAMICIN <=0.5 SENSITIVE Sensitive     OXACILLIN >=4 RESISTANT Resistant     TETRACYCLINE >=16  RESISTANT Resistant     VANCOMYCIN <=0.5 SENSITIVE Sensitive     TRIMETH/SULFA <=10 SENSITIVE Sensitive     CLINDAMYCIN <=0.25 SENSITIVE Sensitive     RIFAMPIN <=0.5 SENSITIVE Sensitive     Inducible Clindamycin NEGATIVE Sensitive     * ABUNDANT METHICILLIN RESISTANT STAPHYLOCOCCUS AUREUS  Acid Fast Smear (AFB)     Status: None   Collection Time: 10/09/20  3:46 PM   Specimen: PATH Cytology Pleural fluid; Body Fluid  Result Value Ref Range Status   AFB Specimen Processing Concentration  Final   Acid Fast Smear Negative  Final    Comment: (NOTE) Performed At: [facility] [address] [place], [place] [id number] Jolene Schimke MD [id number]    Source (AFB) FLUID  Final    Comment: PLEURAL RIGHT Performed at [facility], [address], [place], [place] [address]   Aerobic/Anaerobic Culture w Gram Stain (surgical/deep wound)     Status: None (Preliminary result)   Collection Time: 10/09/20  3:52 PM   Specimen: Soft Tissue, Other  Result Value Ref Range Status   Specimen Description TISSUE  Final   Special Requests PLEURAL PEEL  Final   Gram Stain   Final    RARE WBC PRESENT,BOTH PMN AND MONONUCLEAR RARE GRAM POSITIVE COCCI IN CLUSTERS Performed at [facility], [address], [place], [place] [address]    Culture   Final    MODERATE METHICILLIN RESISTANT STAPHYLOCOCCUS AUREUS NO ANAEROBES ISOLATED; CULTURE IN PROGRESS FOR 5 DAYS    Report Status PENDING  Incomplete   Organism ID, Bacteria METHICILLIN RESISTANT STAPHYLOCOCCUS AUREUS  Final      Susceptibility   Methicillin resistant staphylococcus aureus - MIC*    CIPROFLOXACIN >=8 RESISTANT Resistant     ERYTHROMYCIN >=8 RESISTANT Resistant     GENTAMICIN <=0.5 SENSITIVE Sensitive     OXACILLIN >=4 RESISTANT Resistant     TETRACYCLINE >=16 RESISTANT Resistant     VANCOMYCIN <=0.5 SENSITIVE Sensitive     TRIMETH/SULFA <=10 SENSITIVE Sensitive     CLINDAMYCIN <=0.25 SENSITIVE Sensitive     RIFAMPIN <=0.5 SENSITIVE Sensitive     Inducible Clindamycin NEGATIVE Sensitive     * MODERATE METHICILLIN RESISTANT STAPHYLOCOCCUS AUREUS  Acid Fast Smear (AFB)     Status: None   Collection Time: 10/09/20  3:52 PM   Specimen: Soft Tissue, Other  Result Value Ref Range Status   AFB Specimen Processing Comment  Final    Comment: Tissue Grinding and Digestion/Decontamination   Acid Fast Smear Negative  Final    Comment: (NOTE) Performed At: [facility] [address] [place], [place] [id number] Jolene Schimke MD MM:[id number]    Source (AFB) TISSUE  Final    Comment: PLEURAL PEEL Performed at [facility], [address], [place], [place] [address]       Radiology Studies: MR THORACIC SPINE WO CONTRAST  Result Date: 10/14/2020 CLINICAL DATA:  Initial evaluation for acute MRSA infection. EXAM: MRI THORACIC SPINE WITHOUT CONTRAST TECHNIQUE: Multiplanar, multisequence MR imaging of the thoracic spine was performed. No intravenous contrast was administered. COMPARISON:  Prior CT from 10/06/2020 FINDINGS: Alignment: Examination degraded by motion artifact. Physiologic with preservation of the normal thoracic kyphosis. No listhesis. Vertebrae: Vertebral body height maintained  without acute or chronic fracture. Bone marrow signal intensity diffusely decreased on T1 weighted imaging, nonspecific, but most commonly related to anemia, smoking, or obesity. No discrete or worrisome osseous lesions. Signal changes consistent with osteomyelitis discitis seen  about the T11-12 interspace. There is associated disc space height loss with mild endplate irregularity and reactive marrow edema. Associated epidural abscess within the right ventral epidural space measures approximately 0.7 x 1.6 x 2.1 cm (AP by transverse by craniocaudad) (series 24, image 40). Inferior extension to nearly the level of the T12-L1 interspace inferiorly. Secondary compression of the distal thoracic cord which is flattened and deviated to the left and posteriorly. Secondary severe spinal stenosis. No definite cord signal changes. Foramina remain grossly patent. Cord: Signal intensity within the thoracic spinal cord grossly within normal limits on this motion degraded exam. No definite cord signal changes. Paraspinal and other soft tissues: Mild paraspinous edema adjacent to the T11-12 interspace. No loculated paraspinous collections. Complex right pleural effusion with associated atelectasis and/or consolidation partially visualize, better evaluated on prior chest CT. 9 mm cystic collection just to the right of midline in the subcutaneous fat at the level of T3-4 noted, likely a sebaceous cyst (series 25, image 13). An additional probable 1.2 cm sebaceous cyst noted at the level of the lower cervical spine (series 25, image 3). Disc levels: T1-2:  Unremarkable. T2-3: Unremarkable. T3-4: Left paracentral to subarticular disc protrusion indents the left ventral thecal sac. Mild flattening of the left ventral cord. Bilateral facet hypertrophy. No significant spinal stenosis. Foramina remain grossly patent. T4-5: Mild disc bulge with facet hypertrophy. No significant spinal stenosis. Foramina remain grossly patent. T5-6:   Negative interspace.  Mild facet hypertrophy.  No stenosis. T6-7: Small right foraminal disc protrusion. No significant spinal stenosis. Foramina remain patent. T7-8: Mild disc bulge with superimposed right paracentral to subarticular disc protrusion. Mild facet hypertrophy. Flattening of the right ventral thecal sac without significant spinal stenosis. Foramina remain patent. T8-9: Mild disc bulge, asymmetric to the left. Mild facet hypertrophy. No significant spinal stenosis. Foramina remain patent. T9-10: Small left paracentral disc protrusion flattens the left ventral thecal sac. Mild facet hypertrophy. No significant spinal stenosis. Mild left foraminal narrowing. Right neural foramina remains patent. T10-11: Central disc protrusion flattens and indents the ventral thecal sac, slightly eccentric to the left (series 24, image thirty-six). Mild facet hypertrophy. Resultant mild spinal stenosis with minimal cord flattening, but no cord signal changes. Foramina remain patent. T11-12: Changes concerning for osteomyelitis discitis. 0.7 x 1.6 x 2.1 cm epidural abscess within the right ventral epidural space. Secondary compression of the distal thoracic cord which is flattened and slightly deviated posteriorly and to the left. No appreciable cord signal changes. Severe spinal stenosis with the thecal sac measuring 5 mm in AP diameter at its most narrow point. Epidural phlegmon and/or abscess likely extends laterally into the right neural foramen. No significant foraminal stenosis. T12-L1:  Negative. IMPRESSION: 1. 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 Associated epidural abscess within the right ventral epidural space measuring 0.7 x 1.6 x 2.1 cm, with resultant severe spinal stenosis and compression of the distal thoracic cord. No appreciable cord signal changes. 2. Complex right pleural effusion/empyema with associated atelectasis and/or consolidation, better evaluated on prior chest CT. 3.  Additional multilevel degenerative spondylosis and disc protrusions as above, with resultant mild spinal stenosis at T10-11. Critical Value/emergent results were called by telephone at the time of interpretation on 10/14/2020 at 2:21 am to provider Dr. Robb Matar, Who verbally acknowledged these results. Electronically Signed   By: Rise Mu M.D.   On: 10/14/2020 02:22   DG CHEST PORT 1 VIEW  Result Date: 10/14/2020 CLINICAL DATA:  Patient reports surgery of empyema on October 09, 2020. EXAM: PORTABLE CHEST 1 VIEW COMPARISON:  October 13, 2020 FINDINGS: The right chest tube is stable. No pneumothorax. Opacity remains in the right mid and lower lung, similar in the interval. No other interval changes or acute abnormalities. The cardiomediastinal silhouette is stable. The left lung is clear. The right PICC line terminates in the SVC just below the brachiocephalic confluence. IMPRESSION: 1. Side port as above.  Right chest tube without pneumothorax. 2. Opacity remains in the right mid and lower lung, unchanged. Electronically Signed   By: Gerome Sam III M.D   On: 10/14/2020 08:33   DG CHEST PORT 1 VIEW  Result Date: 10/13/2020 CLINICAL DATA:  Empyema.  Chest tube. EXAM: PORTABLE CHEST 1 VIEW COMPARISON:  October 12, 2020 FINDINGS: The right PICC line terminates in the SVC just below the brachiocephalic confluence. No pneumothorax. The cardiomediastinal silhouette is stable. The left lung is clear. Opacity remains in the right base. Two right-sided chest tubes remain. No other acute abnormalities. IMPRESSION: 1. Support apparatus as above. 2. Infiltrate/opacity in the right mid and lower lung could represent pneumonia, aspiration, or remaining pleural fluid/empyema with adjacent atelectasis. Recommend clinical correlation. A CT scan of the chest could better evaluate the amount of remaining pleural fluid/empyema. Electronically Signed   By: Gerome Sam III M.D   On: 10/13/2020 08:46    Scheduled  Meds: . acetaminophen  1,000 mg Oral Q6H   Or  . acetaminophen (TYLENOL) oral liquid 160 mg/5 mL  1,000 mg Per Tube Q6H  . buprenorphine-naloxone  1 tablet Sublingual BID  . Chlorhexidine Gluconate Cloth  6 each Topical Daily  . docusate sodium  100 mg Oral BID  . mouth rinse  15 mL Mouth Rinse BID  . nicotine  14 mg Transdermal Daily  . pantoprazole (PROTONIX) IV  40 mg Intravenous Daily  . polyethylene glycol  17 g Oral Daily  . senna-docusate  2 tablet Oral QHS  . sodium chloride flush  10-40 mL Intracatheter Q12H  . sodium chloride flush  3 mL Intravenous Q12H   Continuous Infusions: . sodium chloride 10 mL/hr at 10/12/20 1400  . vancomycin 2,000 mg (10/13/20 1253)     LOS: 7 days   Time spent: 31 minutes   Hughie Closs, MD [facility]  10/14/2020, 9:28 AM   To contact the attending provider between 7A-7P or the covering provider during after hours 7P-7A, please log into the web site [email or web address].

## 2020-10-14 NOTE — Progress Notes (Signed)
Chart check   Mri tspine T11-12 om with associated epidural abscess, pending lspine mri nsg involved   Vertebral om/epidural abscess Empyema mrsa ivdu   -needs lumbar spine mri -appreciate nsg input -in light of event, would continue on vancomycin for now -duration of tx will be at least 8 weeks; will set the clock once source controlled (pending nsg further evaluation to see if any debridement is needed --- looks like it is planned) -please get 2 set of repeat blood culture immediately post-op; risk of staph aureus translocation periop is high and has management implication

## 2020-10-14 NOTE — Progress Notes (Incomplete)
TR[facility] night shift.  Radiology called critical value results of MRI of thoracic spine.

## 2020-10-14 NOTE — Consult Note (Addendum)
Reason for Consult: epidural abscess Referring Physician: Dr. Sanda Kleinavid Woods  Derek Woods is an 30 y.o. male.   HPI:  30 year old male presented to the hospital 6 days ago with persistent shortness of breath and difficulty breathing as well as back pain.  States that he was seen at [facility] a week ago with difficulty breathing and was sent home with a diagnosis of pleural effusion.  The difficulty breathing improved some and then got worse again and he presented to the hospital again.  He was diagnosed with empyema.  Since being in the hospital his pain has progressively gotten worse in his lower back.  Denies any numbness tingling weakness in his legs.  Denies any change in his bowel bladder habits.  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  States that he uses this to function on a daily basis and not necessarily "get high."  He has been on Suboxone.  Has been in and out of rehab for his IV drug use.  Has difficulty laying flat on his back.  He was unable to finish the MRI lumbar spine because of the pain.  Feels more comfortable when he is sitting up or standing.  Rates his pain 10/10, constant, and achy.  PICC line in place.  He has been on vancomycin.  Past Medical History:  Diagnosis Date  . Anxiety   . Asthma   . Morbid obesity with BMI of 50.0-59.9, adult (HCC)   . Polysubstance dependence including opioid type drug with complication, episodic abuse (HCC)   . Tobacco dependence     Past Surgical History:  Procedure Laterality Date  . THORACOTOMY Right 10/09/2020   Procedure: RIGHT VATS/ THORACOTOMY/EMPYEMA;  Surgeon: Derek Woods, Derek K, MD;  Location[facility] OR;  Service: Thoracic;  Laterality: Right;    Allergies  Allergen Reactions  . Augmentin [Amoxicillin-Pot Clavulanate] Other (See Comments)    Pt does not recall, childhood  . Ceclor [Cefaclor] Other (See Comments)    Pt does not recall, childhood  . Sulfa Antibiotics Hives     Social History   Tobacco Use  . Smoking status: Current Every Day Smoker    Packs/day: 1.00    Years: 11.00    Pack years: 11.00    Types: Cigarettes  . Smokeless tobacco: Current User  . Tobacco comment: unable to smoke; occasional use of oral tobacco  Substance Use Topics  . Alcohol use: Yes    Comment: "hardly any"    History reviewed. No pertinent family history.   Review of Systems  Positive ROS: As above  All other systems have been reviewed and were otherwise negative with the exception of those mentioned in the HPI and as above.  Objective: Vital signs in last 24 hours: Temp:  [97.8 F (36.6 C)-99.3 F (37.4 C)] 98.4 F (36.9 C) (05/01 0402) Pulse Rate:  [86-102] 89 (05/01 0402) Resp:  [16-23] 20 (05/01 0402) BP: (136-181)/(79-95) 141/79 (05/01 0402) SpO2:  [91 %-98 %] 98 % (05/01 0402)  General Appearance: Alert, cooperative, no distress, appears stated age Head: Normocephalic, without obvious abnormality, atraumatic Eyes: PERRL, conjunctiva/corneas clear, EOM's intact, fundi benign, both eyes      Back: Symmetric, no curvature, ROM normal, no CVA tenderness Lungs: respirations unlabored Heart: Regular rate and rhythm Extremities: Extremities normal, atraumatic, no cyanosis or edema Pulses: 2+ and symmetric all extremities Skin: Skin color, texture, turgor normal, no rashes or lesions  NEUROLOGIC:  Mental status: A&O x4, no aphasia, good attention span, Memory and fund of knowledge Motor Exam - grossly normal, normal tone and bulk Sensory Exam - grossly normal Reflexes: symmetric, no pathologic reflexes, No Hoffman's, mild clonus Coordination - grossly normal Gait - not tested Balance -not tested Cranial Nerves: I: smell Not tested  II: visual acuity  OS: na  OD: na  II: visual fields Full to confrontation  II: pupils Equal, round, reactive to light  III,VII: ptosis None  III,IV,VI: extraocular muscles  Full ROM  V: mastication   V: facial light  touch sensation    V,VII: corneal reflex    VII: facial muscle function - upper    VII: facial muscle function - lower   VIII: hearing   IX: soft palate elevation    IX,X: gag reflex   XI: trapezius strength    XI: sternocleidomastoid strength   XI: neck flexion strength    XII: tongue strength      Data Review Lab Results  Component Value Date   WBC 7.2 10/14/2020   HGB 9.7 (L) 10/14/2020   HCT 30.7 (L) 10/14/2020   MCV 94.5 10/14/2020   PLT 312 10/14/2020   Lab Results  Component Value Date   NA 134 (L) 10/13/2020   Woods 4.3 10/13/2020   CL 100 10/13/2020   CO2 28 10/13/2020   BUN 19 10/13/2020   CREATININE 0.76 10/13/2020   GLUCOSE 123 (H) 10/13/2020   No results found for: INR, PROTIME  Radiology: DG CHEST PORT 1 VIEW  Result Date: 10/08/2020 CLINICAL DATA:  Short of breath, cough, right-sided empyema EXAM: PORTABLE CHEST 1 VIEW COMPARISON:  CT scan 10/06/2020 FINDINGS: Rounded airspace opacity occupying the right mid and lower lung consistent with known loculated pleural effusion/empyema as seen on recent CT imaging. There is associated atelectasis in the adjacent right middle and right lower lobes. Cardiac and mediastinal contours are normal. The lungs are otherwise clear. No pneumothorax. No acute osseous abnormality. IMPRESSION: Loculated pleural effusion/empyema present in the right mid and lower lung. Associated mild atelectasis in the adjacent displaced right middle and lower lobes. Electronically Signed   By: Derek Woods M.D.   On: 10/08/2020 10:08   ECHOCARDIOGRAM COMPLETE  Result Date: 10/08/2020    ECHOCARDIOGRAM REPORT   Patient Name:   Derek Woods Date of Exam: 10/08/2020 Medical Rec #:  [id number]    Height:       66.0 in Accession #:    [id number]   Weight:       323.4 lb Date of Birth:  11/04/1990    BSA:          2.453 m Patient Age:    29 years     BP:           145/91 mmHg Patient Gender: M            HR:           67 bpm. Exam Location:  Inpatient  Procedure: 2D Echo, Cardiac Doppler, Color Doppler and Intracardiac            Opacification Agent Indications:    Endocarditis I38  History:        Patient has no prior history of Echocardiogram examinations.                 Risk Factors:Current Smoker. Large right empyema with a 3-week                 history  of progressive right-sided back pain and shortness of                 breath. Polysubstance abuse. Asthma.  Sonographer:    Leta Jungling RDCS Referring Phys: [id number] JENNIFER YATES IMPRESSIONS  1. Left ventricular ejection fraction, by estimation, is 60 to 65%. The left ventricle has normal function. The left ventricle has no regional wall motion abnormalities. Left ventricular diastolic parameters were normal.  2. Right ventricular systolic function is normal. The right ventricular size is normal.  3. The mitral valve is normal in structure. No evidence of mitral valve regurgitation. No evidence of mitral stenosis.  4. The aortic valve is normal in structure. Aortic valve regurgitation is not visualized. No aortic stenosis is present.  5. The inferior vena cava is normal in size with greater than 50% respiratory variability, suggesting right atrial pressure of 3 mmHg. FINDINGS  Left Ventricle: Left ventricular ejection fraction, by estimation, is 60 to 65%. The left ventricle has normal function. The left ventricle has no regional wall motion abnormalities. Definity contrast agent was given IV to delineate the left ventricular  endocardial borders. The left ventricular internal cavity size was normal in size. There is no left ventricular hypertrophy. Left ventricular diastolic parameters were normal. Right Ventricle: The right ventricular size is normal. No increase in right ventricular wall thickness. Right ventricular systolic function is normal. Left Atrium: Left atrial size was normal in size. Right Atrium: Right atrial size was normal in size. Pericardium: There is no evidence of pericardial effusion.  Mitral Valve: The mitral valve is normal in structure. No evidence of mitral valve regurgitation. No evidence of mitral valve stenosis. Tricuspid Valve: The tricuspid valve is normal in structure. Tricuspid valve regurgitation is not demonstrated. No evidence of tricuspid stenosis. Aortic Valve: The aortic valve is normal in structure. Aortic valve regurgitation is not visualized. No aortic stenosis is present. Pulmonic Valve: The pulmonic valve was normal in structure. Pulmonic valve regurgitation is not visualized. No evidence of pulmonic stenosis. Aorta: The aortic root is normal in size and structure. Venous: The inferior vena cava is normal in size with greater than 50% respiratory variability, suggesting right atrial pressure of 3 mmHg. IAS/Shunts: No atrial level shunt detected by color flow Doppler.  LEFT VENTRICLE PLAX 2D LVIDd:         4.90 cm      Diastology LVIDs:         3.30 cm      LV e' medial:    8.38 cm/s LV PW:         0.90 cm      LV E/e' medial:  14.0 LV IVS:        0.90 cm      LV e' lateral:   16.00 cm/s LVOT diam:     2.20 cm      LV E/e' lateral: 7.3 LV SV:         86 LV SV Index:   35 LVOT Area:     3.80 cm  LV Volumes (MOD) LV vol d, MOD A2C: 180.5 ml LV vol d, MOD A4C: 171.5 ml LV vol s, MOD A2C: 55.5 ml LV vol s, MOD A4C: 76.7 ml LV SV MOD A2C:     125.0 ml LV SV MOD A4C:     171.5 ml LV SV MOD BP:      107.7 ml RIGHT VENTRICLE RV S prime:     15.10 cm/s TAPSE (M-mode): 2.0 cm LEFT  ATRIUM             Index       RIGHT ATRIUM          Index LA diam:        3.70 cm 1.51 cm/m  RA Area:     5.59 cm LA Vol (A2C):   32.9 ml 13.41 ml/m RA Volume:   8.80 ml  3.59 ml/m LA Vol (A4C):   24.6 ml 10.03 ml/m LA Biplane Vol: 29.5 ml 12.03 ml/m  AORTIC VALVE LVOT Vmax:   125.00 cm/s LVOT Vmean:  85.900 cm/s LVOT VTI:    0.227 m  AORTA Ao Root diam: 3.10 cm MITRAL VALVE MV Area (PHT): 4.15 cm     SHUNTS MV Decel Time: 183 msec     Systemic VTI:  0.23 m MV E velocity: 117.00 cm/s  Systemic Diam:  2.20 cm MV A velocity: 79.30 cm/s MV E/A ratio:  1.48 Chilton Si MD Electronically signed by Chilton Si MD Signature Date/Time: 10/08/2020/1:48:31 PM    Final      Assessment/Plan: 30 year old male presenting to the hospital 6 days ago with persistent shortness of breath and difficulty breathing as well as lower back pain.  MRI was performed this morning of his thoracic spine he was unable to follow through with the lumbar spine.  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  Patient is neurologically intact with mild clonus.  I do think he will need a thoracic laminectomy for evacuation of epidural abscess at T11-T12.  We discussed risk benefits associated with surgery.  He understood and agrees to move forward.  We discussed the importance of getting off of the IV drugs to prevent further complications. States that he is ready to go to rehab after he finishes his course of antibiotic treatment here.   Tiana Loft Meyran 10/14/2020 5:30 AM  Addendum: Agree with above.  Patient neurologically asymptomatic except for localized back pain denies any numbness tingling weakness in his legs has been ambulatory and denies any bowel or bladder difficulties.  Symptoms have been stable over the week he has been in the hospital needs pain medication to control the pain but denies any progression of the pain and denies any new symptoms.  He has been in the hospital since Saturday and receiving to be IV antibiotics during that time.  I have recommended a thoracic laminectomy at T11-12 extensively gone over the risks and benefits of the operation with him as well as perioperative course expectations of outcome and alternatives to surgery and he understands and agrees to proceed forward.  He does have a large component of ventral abscess but a more significant and larger component of right paramedian that I  think we can remove with decompression.  However this morning patient has recently had a fairly large breakfast and due to the patient's stable neurologic exam with no neurologic symptoms just back pain for the last week we will plan on surgical decompression tomorrow.  Should he decline or experience any new lower extremity symptoms we may need to reevaluate timing.  Patient will need longer term of IV antibiotics defer choice to infectious disease.

## 2020-10-14 NOTE — Progress Notes (Addendum)
[address]       [name] [address]             [phone]      5 Days Post-Op Procedure(s) (LRB): RIGHT VATS/ THORACOTOMY/EMPYEMA (Right) Subjective: Feels ok, some nausea, mild. Breathing is comfortable  Objective: Vital signs in last 24 hours: Temp:  [98.4 F (36.9 C)-99.3 F (37.4 C)] 98.5 F (36.9 C) (05/01 0735) Pulse Rate:  [88-102] 90 (05/01 0735) Cardiac Rhythm: Normal sinus rhythm (05/01 0700) Resp:  [16-24] 24 (05/01 0735) BP: (136-181)/(79-95) 156/93 (05/01 0735) SpO2:  [91 %-98 %] 98 % (05/01 0735)  Hemodynamic parameters for last 24 hours:    Intake/Output from previous day: 04/30 0701 - 05/01 0700 In: 1240 [P.O.:840; IV Piggyback:400] Out: 2215 [Urine:2175; Chest Tube:40] Intake/Output this shift: No intake/output data recorded.  General appearance: alert, cooperative and no distress Heart: regular rate and rhythm Lungs: min dim right base Abdomen: benign Extremities: no calf tenderness/cramping Wound: dressing CDI  Lab Results: Recent Labs    10/13/20 0339 10/14/20 0331  WBC 6.2 7.2  HGB 9.1* 9.7*  HCT 28.2* 30.7*  PLT 262 312   BMET:  Recent Labs    10/12/20 0435 10/13/20 0339  NA 133* 134*  K 4.0 4.3  CL 100 100  CO2 28 28  GLUCOSE 120* 123*  BUN 16 19  CREATININE 0.81 0.76  CALCIUM 8.6* 8.7*    PT/INR:  Recent Labs    10/14/20 0901  LABPROT 13.5  INR 1.0   ABG    Component Value Date/Time   PHART 7.386 10/10/2020 0458   HCO3 28.7 (H) 10/10/2020 0458   TCO2 30 10/10/2020 0458   O2SAT 99.0 10/10/2020 0458   CBG (last 3)  Recent Labs    10/11/20 1128 10/11/20 1559  GLUCAP 140* 128*    Meds Scheduled Meds: . acetaminophen  1,000 mg Oral Q6H   Or  . acetaminophen (TYLENOL) oral liquid 160 mg/5 mL  1,000 mg Per Tube Q6H  . amLODipine  5 mg Oral Daily  . buprenorphine-naloxone  1 tablet Sublingual BID  . Chlorhexidine Gluconate Cloth  6 each Topical Daily  . docusate sodium  100 mg  Oral BID  . mouth rinse  15 mL Mouth Rinse BID  . nicotine  14 mg Transdermal Daily  . pantoprazole (PROTONIX) IV  40 mg Intravenous Daily  . polyethylene glycol  17 g Oral Daily  . senna-docusate  2 tablet Oral QHS  . sodium chloride flush  10-40 mL Intracatheter Q12H  . sodium chloride flush  3 mL Intravenous Q12H   Continuous Infusions: . sodium chloride 10 mL/hr at 10/12/20 1400  . vancomycin 2,000 mg (10/13/20 1253)   PRN Meds:.sodium chloride, albuterol, dicyclomine, hydrALAZINE, hydrOXYzine, ketorolac, melatonin, methocarbamol, phenol, sodium chloride flush  Xrays MR THORACIC SPINE WO CONTRAST  Result Date: 10/14/2020 CLINICAL DATA:  Initial evaluation for acute MRSA infection. EXAM: MRI THORACIC SPINE WITHOUT CONTRAST TECHNIQUE: Multiplanar, multisequence MR imaging of the thoracic spine was performed. No intravenous contrast was administered. COMPARISON:  Prior CT from 10/06/2020 FINDINGS: Alignment: Examination degraded by motion artifact. Physiologic with preservation of the normal thoracic kyphosis. No listhesis. Vertebrae: Vertebral body height maintained without acute or chronic fracture. Bone marrow signal intensity diffusely decreased on T1 weighted imaging, nonspecific, but most commonly related to anemia, smoking, or obesity. No discrete or worrisome osseous lesions. Signal changes consistent with osteomyelitis discitis seen about the T11-12 interspace. There is associated disc  space height loss with mild endplate irregularity and reactive marrow edema. Associated epidural abscess within the right ventral epidural space measures approximately 0.7 x 1.6 x 2.1 cm (AP by transverse by craniocaudad) (series 24, image 40). Inferior extension to nearly the level of the T12-L1 interspace inferiorly. Secondary compression of the distal thoracic cord which is flattened and deviated to the left and posteriorly. Secondary severe spinal stenosis. No definite cord signal changes. Foramina  remain grossly patent. Cord: Signal intensity within the thoracic spinal cord grossly within normal limits on this motion degraded exam. No definite cord signal changes. Paraspinal and other soft tissues: Mild paraspinous edema adjacent to the T11-12 interspace. No loculated paraspinous collections. Complex right pleural effusion with associated atelectasis and/or consolidation partially visualize, better evaluated on prior chest CT. 9 mm cystic collection just to the right of midline in the subcutaneous fat at the level of T3-4 noted, likely a sebaceous cyst (series 25, image 13). An additional probable 1.2 cm sebaceous cyst noted at the level of the lower cervical spine (series 25, image 3). Disc levels: T1-2:  Unremarkable. T2-3: Unremarkable. T3-4: Left paracentral to subarticular disc protrusion indents the left ventral thecal sac. Mild flattening of the left ventral cord. Bilateral facet hypertrophy. No significant spinal stenosis. Foramina remain grossly patent. T4-5: Mild disc bulge with facet hypertrophy. No significant spinal stenosis. Foramina remain grossly patent. T5-6:  Negative interspace.  Mild facet hypertrophy.  No stenosis. T6-7: Small right foraminal disc protrusion. No significant spinal stenosis. Foramina remain patent. T7-8: Mild disc bulge with superimposed right paracentral to subarticular disc protrusion. Mild facet hypertrophy. Flattening of the right ventral thecal sac without significant spinal stenosis. Foramina remain patent. T8-9: Mild disc bulge, asymmetric to the left. Mild facet hypertrophy. No significant spinal stenosis. Foramina remain patent. T9-10: Small left paracentral disc protrusion flattens the left ventral thecal sac. Mild facet hypertrophy. No significant spinal stenosis. Mild left foraminal narrowing. Right neural foramina remains patent. T10-11: Central disc protrusion flattens and indents the ventral thecal sac, slightly eccentric to the left (series 24, image  thirty-six). Mild facet hypertrophy. Resultant mild spinal stenosis with minimal cord flattening, but no cord signal changes. Foramina remain patent. T11-12: Changes concerning for osteomyelitis discitis. 0.7 x 1.6 x 2.1 cm epidural abscess within the right ventral epidural space. Secondary compression of the distal thoracic cord which is flattened and slightly deviated posteriorly and to the left. No appreciable cord signal changes. Severe spinal stenosis with the thecal sac measuring 5 mm in AP diameter at its most narrow point. Epidural phlegmon and/or abscess likely extends laterally into the right neural foramen. No significant foraminal stenosis. T12-L1:  Negative. IMPRESSION: 1. 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 Associated epidural abscess within the right ventral epidural space measuring 0.7 x 1.6 x 2.1 cm, with resultant severe spinal stenosis and compression of the distal thoracic cord. No appreciable cord signal changes. 2. Complex right pleural effusion/empyema with associated atelectasis and/or consolidation, better evaluated on prior chest CT. 3. Additional multilevel degenerative spondylosis and disc protrusions as above, with resultant mild spinal stenosis at T10-11. Critical Value/emergent results were called by telephone at the time of interpretation on 10/14/2020 at 2:21 am to provider Dr. Robb Matar, Who verbally acknowledged these results. Electronically Signed   By: Rise Mu M.D.   On: 10/14/2020 02:22   DG CHEST PORT 1 VIEW  Result Date: 10/14/2020 CLINICAL DATA:  Patient reports surgery of empyema on October 09, 2020. EXAM: PORTABLE CHEST 1 VIEW COMPARISON:  October 13, 2020 FINDINGS: The right chest tube is stable. No pneumothorax. Opacity remains in the right mid and lower lung, similar in the interval. No other interval changes or acute abnormalities. The cardiomediastinal silhouette is stable. The left lung is clear. The right PICC line terminates in the SVC  just below the brachiocephalic confluence. IMPRESSION: 1. Side port as above.  Right chest tube without pneumothorax. 2. Opacity remains in the right mid and lower lung, unchanged. Electronically Signed   By: Gerome Sam III M.D   On: 10/14/2020 08:33   DG CHEST PORT 1 VIEW  Result Date: 10/13/2020 CLINICAL DATA:  Empyema.  Chest tube. EXAM: PORTABLE CHEST 1 VIEW COMPARISON:  October 12, 2020 FINDINGS: The right PICC line terminates in the SVC just below the brachiocephalic confluence. No pneumothorax. The cardiomediastinal silhouette is stable. The left lung is clear. Opacity remains in the right base. Two right-sided chest tubes remain. No other acute abnormalities. IMPRESSION: 1. Support apparatus as above. 2. Infiltrate/opacity in the right mid and lower lung could represent pneumonia, aspiration, or remaining pleural fluid/empyema with adjacent atelectasis. Recommend clinical correlation. A CT scan of the chest could better evaluate the amount of remaining pleural fluid/empyema. Electronically Signed   By: Gerome Sam III M.D   On: 10/13/2020 08:46    Assessment/Plan: S/P Procedure(s) (LRB): RIGHT VATS/ THORACOTOMY/EMPYEMA (Right)  1 Tmax 99.3. SBP 130's to 180's, sinus rhythm 2 sats good on RA 3 CT- only 40 cc drainage yesterday, no air leak- d/c tube 4 CXR stable appearance c/w prev studies, no pntx 5 no leukocytosis 6 other management per multiple consultants/primary medicine team 7 plans noted for neurosurg tomorrow  LOS: 7 days    Rowe Clack PA-C Pager [phone] 10/14/2020   Agree with above. Minimal chest tube output.  We will remove today. Will follow peripherally.  Haniel Fix Keane Scrape

## 2020-10-14 NOTE — Plan of Care (Signed)
  Problem: Education: Goal: Knowledge of General Education information will improve Description: Including pain rating scale, medication(s)/side effects and non-pharmacologic comfort measures Outcome: Progressing   Problem: Clinical Measurements: Goal: Ability to maintain clinical measurements within normal limits will improve Outcome: Progressing   Problem: Clinical Measurements: Goal: Diagnostic test results will improve Outcome: Progressing   Problem: Activity: Goal: Risk for activity intolerance will decrease Outcome: Progressing   Problem: Nutrition: Goal: Adequate nutrition will be maintained Outcome: Progressing   Problem: Coping: Goal: Level of anxiety will decrease Outcome: Progressing   Problem: Pain Managment: Goal: General experience of comfort will improve Outcome: Progressing   Problem: Safety: Goal: Ability to remain free from injury will improve Outcome: Progressing   Problem: Skin Integrity: Goal: Risk for impaired skin integrity will decrease Outcome: Progressing

## 2020-10-14 NOTE — Progress Notes (Addendum)
MRI calling to see if patient needs to complete lumbar region MRI imaging he could not complete yesterday 4/30---I have talked with NP Meylar, and she states it is not needed---I have completed order in Epic and advised MRI--also order was placed to "verify obtained consent" for sx tomorrow, but I need the "obtain" consent order to complete consent form, I have advised NP Meylar, and she said they would get consent.

## 2020-10-15 ENCOUNTER — Encounter (HOSPITAL_COMMUNITY)
Admission: AD | Disposition: A | Discharge status: Home / Self Care | Payer: Self-pay | Source: Other Acute Inpatient Hospital | Attending: Internal Medicine

## 2020-10-15 ENCOUNTER — Encounter (HOSPITAL_COMMUNITY): Payer: Self-pay | Admitting: Internal Medicine

## 2020-10-15 ENCOUNTER — Inpatient Hospital Stay (HOSPITAL_COMMUNITY): Payer: Self-pay | Admitting: Anesthesiology

## 2020-10-15 ENCOUNTER — Inpatient Hospital Stay (HOSPITAL_COMMUNITY): Payer: Self-pay

## 2020-10-15 DIAGNOSIS — G061 Intraspinal abscess and granuloma: Secondary | ICD-10-CM | POA: Diagnosis present

## 2020-10-15 DIAGNOSIS — Z720 Tobacco use: Secondary | ICD-10-CM

## 2020-10-15 DIAGNOSIS — B9562 Methicillin resistant Staphylococcus aureus infection as the cause of diseases classified elsewhere: Secondary | ICD-10-CM

## 2020-10-15 HISTORY — PX: THORACIC LAMINECTOMY FOR EPIDURAL ABSCESS: SHX6115

## 2020-10-15 LAB — AEROBIC/ANAEROBIC CULTURE W GRAM STAIN (SURGICAL/DEEP WOUND)

## 2020-10-15 LAB — HCV RT-PCR, QUANT (NON-GRAPH)

## 2020-10-15 LAB — CBC WITH DIFFERENTIAL/PLATELET
Abs Immature Granulocytes: 0.06 10*3/uL (ref 0.00–0.07)
Basophils Absolute: 0.1 10*3/uL (ref 0.0–0.1)
Basophils Relative: 1 %
Eosinophils Absolute: 0.3 10*3/uL (ref 0.0–0.5)
Eosinophils Relative: 6 %
HCT: 29.1 % — ABNORMAL LOW (ref 39.0–52.0)
Hemoglobin: 9.3 g/dL — ABNORMAL LOW (ref 13.0–17.0)
Immature Granulocytes: 1 %
Lymphocytes Relative: 32 %
Lymphs Abs: 2 10*3/uL (ref 0.7–4.0)
MCH: 30 pg (ref 26.0–34.0)
MCHC: 32 g/dL (ref 30.0–36.0)
MCV: 93.9 fL (ref 80.0–100.0)
Monocytes Absolute: 0.2 10*3/uL (ref 0.1–1.0)
Monocytes Relative: 4 %
Neutro Abs: 3.5 10*3/uL (ref 1.7–7.7)
Neutrophils Relative %: 56 %
Platelets: 269 10*3/uL (ref 150–400)
RBC: 3.1 MIL/uL — ABNORMAL LOW (ref 4.22–5.81)
RDW: 14.3 % (ref 11.5–15.5)
WBC: 6.1 10*3/uL (ref 4.0–10.5)
nRBC: 0 % (ref 0.0–0.2)

## 2020-10-15 LAB — BASIC METABOLIC PANEL
Anion gap: 6 (ref 5–15)
BUN: 18 mg/dL (ref 6–20)
CO2: 30 mmol/L (ref 22–32)
Calcium: 8.7 mg/dL — ABNORMAL LOW (ref 8.9–10.3)
Chloride: 100 mmol/L (ref 98–111)
Creatinine, Ser: 0.82 mg/dL (ref 0.61–1.24)
GFR, Estimated: >60 mL/min (ref >60)
Glucose, Bld: 109 mg/dL — ABNORMAL HIGH (ref 70–99)
Potassium: 4.7 mmol/L (ref 3.5–5.1)
Sodium: 136 mmol/L (ref 135–145)

## 2020-10-15 LAB — HCV AB W REFLEX TO QUANT PCR: HCV Ab: 2.9 s/co ratio — ABNORMAL HIGH (ref 0.0–0.9)

## 2020-10-15 IMAGING — RF DG THORACOLUMBAR SPINE 2V
1 series · 2 of 2 positions shown · non-contrast
Comparison: Thoracic spine MRI [DATE]. CT chest/abdomen/pelvis
[DATE].

CLINICAL DATA: Elective surgery. Additional history provided:
T11-12 laminectomy. Provided fluoroscopy time 5 seconds (5.32 mGy).

EXAM:
THORACOLUMBAR SPINE 1V

[Series 1: run · 2 of 2 slices shown]
[im 1/2]
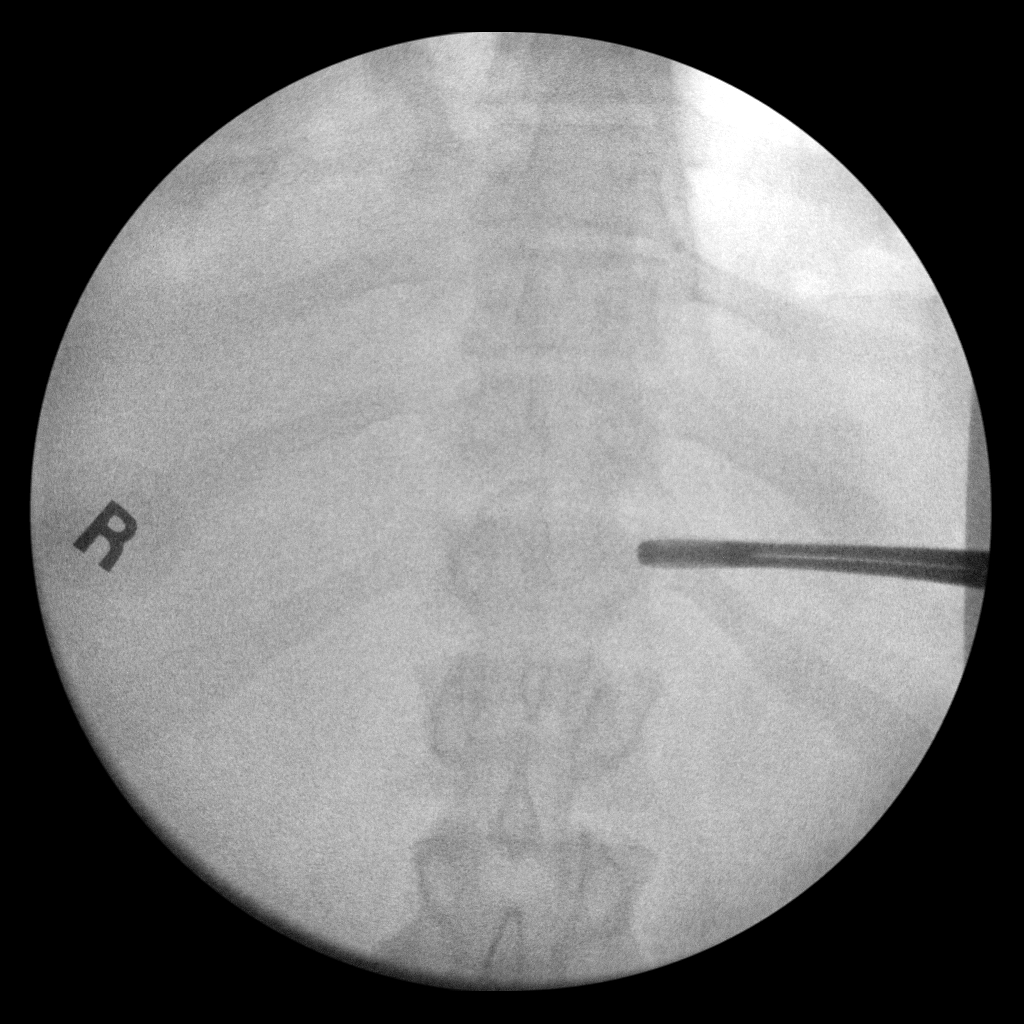
[im 2/2]
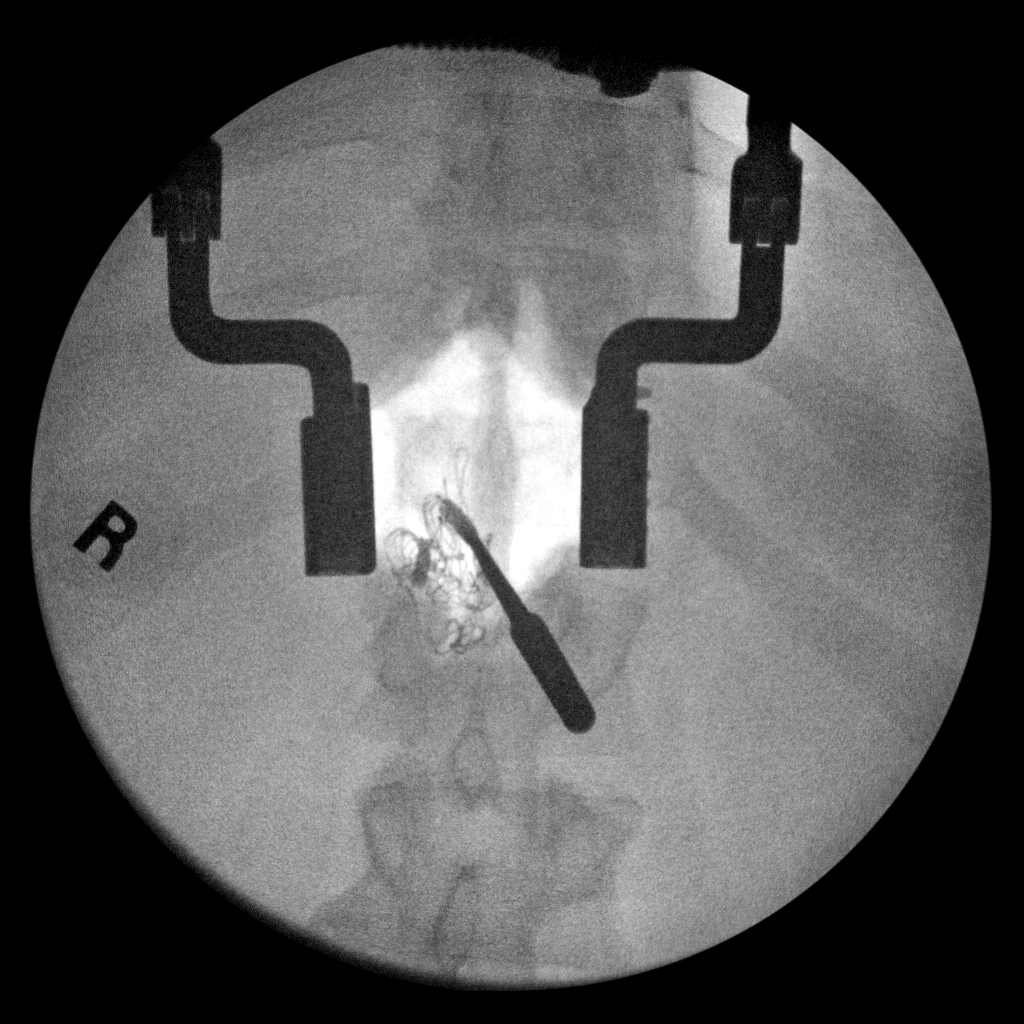

[2 of 2 positions shown; findings below may reference images not displayed]

FINDINGS: Two AP view intraoperative fluoroscopic images of the lower thoracic
spine are submitted. On the initial provided image taken at [DATE]
p.m., a metallic surgical instrument projects over the lower
thoracic spine at the T12 level. On the subsequent image provided at
[DATE] p.m., there is sequela of interval T11-T12 laminectomy.
Curvilinear hyperdensity projects over the laminectomy bed, likely
reflecting a surgical sponge/packing material. A metallic surgical
instrument also projects over the thoracic spine at this level.
Overlying retractors.
IMPRESSION: Two intraoperative fluoroscopic images of the thoracic spine from
reported T11-T12 laminectomy, as described.

On the most recent provided image, curvilinear hyperdensity projects
over the T11-T12 laminectomy bed, likely reflecting a surgical
sponge/packing material. Correlate with the operative history.

## 2020-10-15 IMAGING — RF DG C-ARM 1-60 MIN
1 series · 2 of 2 positions shown · non-contrast
Comparison: Thoracic spine MRI [DATE]. CT chest/abdomen/pelvis
[DATE].

CLINICAL DATA: Elective surgery. Additional history provided:
T11-12 laminectomy. Provided fluoroscopy time 5 seconds (5.32 mGy).

EXAM:
THORACOLUMBAR SPINE 1V

[Series 1: run · 2 of 2 slices shown]
[im 1/2]
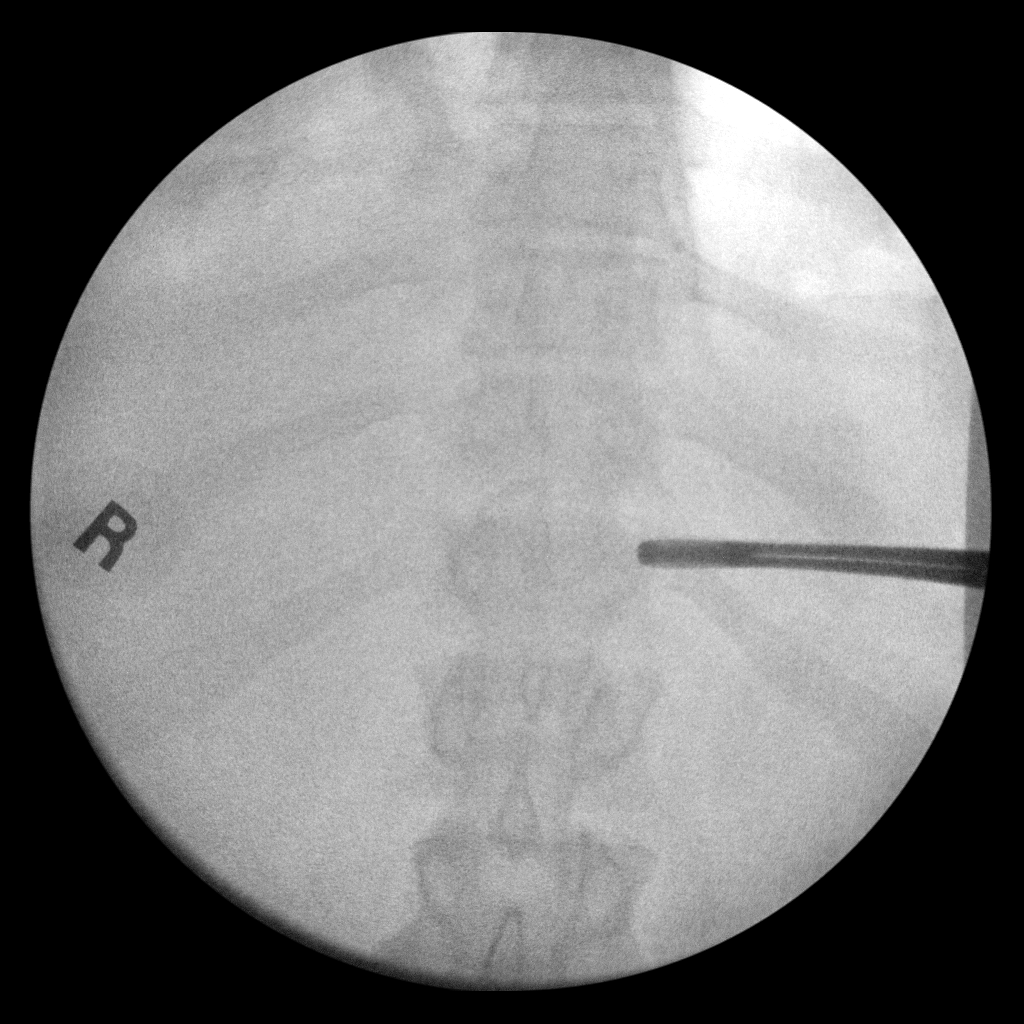
[im 2/2]
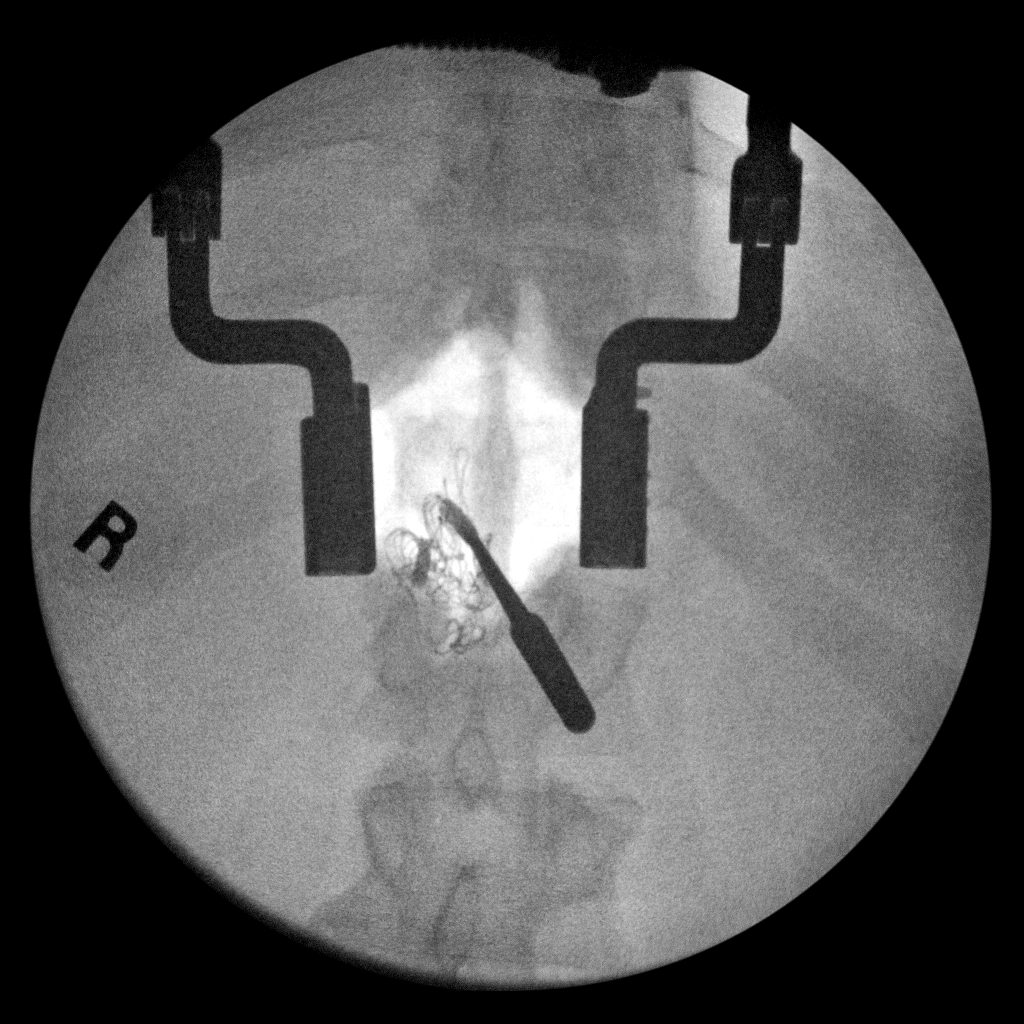

[2 of 2 positions shown; findings below may reference images not displayed]

FINDINGS: Two AP view intraoperative fluoroscopic images of the lower thoracic
spine are submitted. On the initial provided image taken at [DATE]
p.m., a metallic surgical instrument projects over the lower
thoracic spine at the T12 level. On the subsequent image provided at
[DATE] p.m., there is sequela of interval T11-T12 laminectomy.
Curvilinear hyperdensity projects over the laminectomy bed, likely
reflecting a surgical sponge/packing material. A metallic surgical
instrument also projects over the thoracic spine at this level.
Overlying retractors.
IMPRESSION: Two intraoperative fluoroscopic images of the thoracic spine from
reported T11-T12 laminectomy, as described.

On the most recent provided image, curvilinear hyperdensity projects
over the T11-T12 laminectomy bed, likely reflecting a surgical
sponge/packing material. Correlate with the operative history.

## 2020-10-15 SURGERY — THORACIC LAMINECTOMY FOR EPIDURAL ABSCESS
Anesthesia: General

## 2020-10-15 MED ORDER — ALUM & MAG HYDROXIDE-SIMETH 200-200-20 MG/5ML PO SUSP: 30.0000 mL | Freq: Four times a day (QID) | ORAL | Status: DC | PRN

## 2020-10-15 MED ORDER — MIDAZOLAM HCL 2 MG/2ML IJ SOLN
INTRAMUSCULAR | Status: DC | PRN
  Administered 2020-10-15: 2 mg via INTRAVENOUS

## 2020-10-15 MED ORDER — THROMBIN 20000 UNITS EX SOLR
CUTANEOUS | Status: DC | PRN
  Administered 2020-10-15: 20 mL via TOPICAL

## 2020-10-15 MED ORDER — SODIUM CHLORIDE 0.9% FLUSH
3.0000 mL | INTRAVENOUS | Status: DC | PRN
  Administered 2020-10-16 – 2020-11-17 (×10): 3 mL via INTRAVENOUS

## 2020-10-15 MED ORDER — PANTOPRAZOLE SODIUM 40 MG IV SOLR
40.0000 mg | Freq: Every day | INTRAVENOUS | Status: DC
  Administered 2020-10-16 – 2020-10-17 (×2): 40 mg via INTRAVENOUS
  Filled 2020-10-15 (×2): qty 40

## 2020-10-15 MED ORDER — PROMETHAZINE HCL 25 MG/ML IJ SOLN: 6.2500 mg | INTRAMUSCULAR | Status: DC | PRN

## 2020-10-15 MED ORDER — LIDOCAINE 2% (20 MG/ML) 5 ML SYRINGE
INTRAMUSCULAR | Status: AC
  Filled 2020-10-15: qty 5

## 2020-10-15 MED ORDER — KETAMINE HCL 50 MG/5ML IJ SOSY
PREFILLED_SYRINGE | INTRAMUSCULAR | Status: AC
  Filled 2020-10-15: qty 5

## 2020-10-15 MED ORDER — LIDOCAINE-EPINEPHRINE 0.5 %-1:200000 IJ SOLN
INTRAMUSCULAR | Status: AC
  Filled 2020-10-15: qty 1

## 2020-10-15 MED ORDER — ONDANSETRON HCL 4 MG/2ML IJ SOLN
4.0000 mg | Freq: Four times a day (QID) | INTRAMUSCULAR | Status: DC | PRN
  Administered 2020-11-24: 4 mg via INTRAVENOUS
  Filled 2020-10-15: qty 2

## 2020-10-15 MED ORDER — OXYCODONE HCL 5 MG PO TABS
10.0000 mg | ORAL_TABLET | ORAL | Status: DC | PRN
  Administered 2020-10-15: 10 mg via ORAL
  Filled 2020-10-15: qty 2

## 2020-10-15 MED ORDER — KETAMINE HCL 10 MG/ML IJ SOLN
INTRAMUSCULAR | Status: DC | PRN
  Administered 2020-10-15 (×5): 10 mg via INTRAVENOUS

## 2020-10-15 MED ORDER — LACTATED RINGERS IV SOLN: INTRAVENOUS | Status: DC | PRN

## 2020-10-15 MED ORDER — ONDANSETRON HCL 4 MG/2ML IJ SOLN
INTRAMUSCULAR | Status: DC | PRN
  Administered 2020-10-15: 4 mg via INTRAVENOUS

## 2020-10-15 MED ORDER — ACETAMINOPHEN 10 MG/ML IV SOLN
INTRAVENOUS | Status: DC | PRN
  Administered 2020-10-15: 1000 mg via INTRAVENOUS

## 2020-10-15 MED ORDER — BUPIVACAINE HCL (PF) 0.25 % IJ SOLN
INTRAMUSCULAR | Status: AC
  Filled 2020-10-15: qty 30

## 2020-10-15 MED ORDER — FENTANYL CITRATE (PF) 100 MCG/2ML IJ SOLN
INTRAMUSCULAR | Status: AC
  Filled 2020-10-15: qty 2

## 2020-10-15 MED ORDER — ORAL CARE MOUTH RINSE
15.0000 mL | Freq: Once | OROMUCOSAL | Status: AC
  Administered 2020-10-15: 15 mL via OROMUCOSAL

## 2020-10-15 MED ORDER — CEFAZOLIN SODIUM-DEXTROSE 2-4 GM/100ML-% IV SOLN
2.0000 g | Freq: Three times a day (TID) | INTRAVENOUS | Status: AC
  Administered 2020-10-15: 2 g via INTRAVENOUS
  Filled 2020-10-15 (×3): qty 100

## 2020-10-15 MED ORDER — CYCLOBENZAPRINE HCL 10 MG PO TABS
10.0000 mg | ORAL_TABLET | Freq: Three times a day (TID) | ORAL | Status: DC | PRN
  Administered 2020-10-15 – 2020-10-25 (×22): 10 mg via ORAL
  Filled 2020-10-15 (×25): qty 1

## 2020-10-15 MED ORDER — ACETAMINOPHEN 325 MG PO TABS
650.0000 mg | ORAL_TABLET | ORAL | Status: DC | PRN
  Administered 2020-10-16 – 2020-10-17 (×4): 650 mg via ORAL
  Filled 2020-10-15 (×4): qty 2

## 2020-10-15 MED ORDER — PROPOFOL 10 MG/ML IV BOLUS
INTRAVENOUS | Status: AC
  Filled 2020-10-15: qty 20

## 2020-10-15 MED ORDER — SUGAMMADEX SODIUM 200 MG/2ML IV SOLN
INTRAVENOUS | Status: DC | PRN
  Administered 2020-10-15: 200 mg via INTRAVENOUS

## 2020-10-15 MED ORDER — LIDOCAINE-EPINEPHRINE 0.5 %-1:200000 IJ SOLN
INTRAMUSCULAR | Status: DC | PRN
Start: 2020-10-15 — End: 2020-10-15
  Administered 2020-10-15: 10 mL

## 2020-10-15 MED ORDER — HYDROMORPHONE HCL 1 MG/ML IJ SOLN
INTRAMUSCULAR | Status: AC
  Filled 2020-10-15: qty 1

## 2020-10-15 MED ORDER — FENTANYL CITRATE (PF) 100 MCG/2ML IJ SOLN
25.0000 ug | INTRAMUSCULAR | Status: DC | PRN
  Administered 2020-10-15 (×3): 50 ug via INTRAVENOUS

## 2020-10-15 MED ORDER — MENTHOL 3 MG MT LOZG: 1.0000 | LOZENGE | OROMUCOSAL | Status: DC | PRN

## 2020-10-15 MED ORDER — OXYCODONE HCL 5 MG PO TABS: 5.0000 mg | ORAL_TABLET | Freq: Once | ORAL | Status: DC | PRN

## 2020-10-15 MED ORDER — SODIUM CHLORIDE 0.9% FLUSH
3.0000 mL | Freq: Two times a day (BID) | INTRAVENOUS | Status: DC
  Administered 2020-10-16 – 2020-11-17 (×57): 3 mL via INTRAVENOUS

## 2020-10-15 MED ORDER — SUFENTANIL CITRATE 50 MCG/ML IV SOLN
INTRAVENOUS | Status: AC
  Filled 2020-10-15: qty 1

## 2020-10-15 MED ORDER — HYDROMORPHONE HCL 1 MG/ML IJ SOLN
0.2500 mg | INTRAMUSCULAR | Status: DC | PRN
Start: 2020-10-15 — End: 2020-10-15
  Administered 2020-10-15 (×4): 0.5 mg via INTRAVENOUS

## 2020-10-15 MED ORDER — PHENOL 1.4 % MT LIQD
1.0000 | OROMUCOSAL | Status: DC | PRN
  Administered 2020-11-10: 1 via OROMUCOSAL
  Filled 2020-10-15: qty 177

## 2020-10-15 MED ORDER — THROMBIN 5000 UNITS EX SOLR
CUTANEOUS | Status: AC
  Filled 2020-10-15: qty 5000

## 2020-10-15 MED ORDER — ONDANSETRON HCL 4 MG PO TABS: 4.0000 mg | ORAL_TABLET | Freq: Four times a day (QID) | ORAL | Status: DC | PRN

## 2020-10-15 MED ORDER — ACETAMINOPHEN 650 MG RE SUPP: 650.0000 mg | RECTAL | Status: DC | PRN

## 2020-10-15 MED ORDER — LACTATED RINGERS IV SOLN: INTRAVENOUS | Status: DC

## 2020-10-15 MED ORDER — PROPOFOL 10 MG/ML IV BOLUS
INTRAVENOUS | Status: DC | PRN
  Administered 2020-10-15: 200 mg via INTRAVENOUS

## 2020-10-15 MED ORDER — MIDAZOLAM HCL 2 MG/2ML IJ SOLN
INTRAMUSCULAR | Status: AC
  Filled 2020-10-15: qty 2

## 2020-10-15 MED ORDER — SODIUM CHLORIDE 0.9 % IV SOLN: 250.0000 mL | INTRAVENOUS | Status: DC

## 2020-10-15 MED ORDER — THROMBIN 20000 UNITS EX SOLR
CUTANEOUS | Status: AC
  Filled 2020-10-15: qty 20000

## 2020-10-15 MED ORDER — ROCURONIUM BROMIDE 10 MG/ML (PF) SYRINGE
PREFILLED_SYRINGE | INTRAVENOUS | Status: AC
  Filled 2020-10-15: qty 10

## 2020-10-15 MED ORDER — DEXAMETHASONE SODIUM PHOSPHATE 10 MG/ML IJ SOLN
INTRAMUSCULAR | Status: AC
  Filled 2020-10-15: qty 1

## 2020-10-15 MED ORDER — DEXAMETHASONE SODIUM PHOSPHATE 10 MG/ML IJ SOLN
INTRAMUSCULAR | Status: DC | PRN
  Administered 2020-10-15: 5 mg via INTRAVENOUS

## 2020-10-15 MED ORDER — ONDANSETRON HCL 4 MG/2ML IJ SOLN
INTRAMUSCULAR | Status: AC
  Filled 2020-10-15: qty 2

## 2020-10-15 MED ORDER — OXYCODONE HCL 5 MG/5ML PO SOLN: 5.0000 mg | Freq: Once | ORAL | Status: DC | PRN

## 2020-10-15 MED ORDER — LIDOCAINE HCL (CARDIAC) PF 100 MG/5ML IV SOSY
PREFILLED_SYRINGE | INTRAVENOUS | Status: DC | PRN
  Administered 2020-10-15: 80 mg via INTRAVENOUS
  Administered 2020-10-15: 20 mg via INTRAVENOUS

## 2020-10-15 MED ORDER — CHLORHEXIDINE GLUCONATE 0.12 % MT SOLN: 15.0000 mL | Freq: Once | OROMUCOSAL | Status: AC

## 2020-10-15 MED ORDER — ROCURONIUM BROMIDE 10 MG/ML (PF) SYRINGE
PREFILLED_SYRINGE | INTRAVENOUS | Status: DC | PRN
  Administered 2020-10-15 (×2): 50 mg via INTRAVENOUS

## 2020-10-15 MED ORDER — SUFENTANIL CITRATE 50 MCG/ML IV SOLN
INTRAVENOUS | Status: DC | PRN
  Administered 2020-10-15 (×5): 10 ug via INTRAVENOUS

## 2020-10-15 MED ORDER — KETOROLAC TROMETHAMINE 30 MG/ML IJ SOLN
30.0000 mg | Freq: Four times a day (QID) | INTRAMUSCULAR | Status: AC | PRN
  Administered 2020-10-15 – 2020-10-16 (×2): 30 mg via INTRAVENOUS
  Filled 2020-10-15 (×2): qty 1

## 2020-10-15 MED ORDER — CHLORHEXIDINE GLUCONATE 0.12 % MT SOLN
OROMUCOSAL | Status: AC
  Filled 2020-10-15: qty 15

## 2020-10-15 SURGICAL SUPPLY — 59 items
BAG DECANTER FOR FLEXI CONT (MISCELLANEOUS) ×2 IMPLANT
BAND RUBBER #18 3X1/16 STRL (MISCELLANEOUS) IMPLANT
BENZOIN TINCTURE PRP APPL 2/3 (GAUZE/BANDAGES/DRESSINGS) ×2 IMPLANT
BLADE SURG 11 STRL SS (BLADE) ×2 IMPLANT
BUR ACRON 5.0MM COATED (BURR) ×2 IMPLANT
BUR MATCHSTICK NEURO 3.0 LAGG (BURR) ×2 IMPLANT
CANISTER SUCT 3000ML PPV (MISCELLANEOUS) ×2 IMPLANT
CARTRIDGE OIL MAESTRO DRILL (MISCELLANEOUS) ×1 IMPLANT
CLIP VESOCCLUDE MED 6/CT (CLIP) IMPLANT
COVER WAND RF STERILE (DRAPES) ×2 IMPLANT
DERMABOND ADVANCED (GAUZE/BANDAGES/DRESSINGS) ×1
DERMABOND ADVANCED .7 DNX12 (GAUZE/BANDAGES/DRESSINGS) ×1 IMPLANT
DIFFUSER DRILL AIR PNEUMATIC (MISCELLANEOUS) ×2 IMPLANT
DRAPE LAPAROTOMY 100X72 PEDS (DRAPES) IMPLANT
DRAPE LAPAROTOMY 100X72X124 (DRAPES) IMPLANT
DRAPE MICROSCOPE LEICA (MISCELLANEOUS) IMPLANT
DRSG OPSITE POSTOP 4X6 (GAUZE/BANDAGES/DRESSINGS) ×2 IMPLANT
ELECT REM PT RETURN 9FT ADLT (ELECTROSURGICAL) ×2
ELECTRODE REM PT RTRN 9FT ADLT (ELECTROSURGICAL) ×1 IMPLANT
EVACUATOR 1/8 PVC DRAIN (DRAIN) ×2 IMPLANT
GAUZE 4X4 16PLY RFD (DISPOSABLE) IMPLANT
GAUZE SPONGE 4X4 12PLY STRL (GAUZE/BANDAGES/DRESSINGS) IMPLANT
GLOVE ECLIPSE 7.5 STRL STRAW (GLOVE) ×2 IMPLANT
GLOVE EXAM NITRILE XL STR (GLOVE) IMPLANT
GLOVE SRG 8 PF TXTR STRL LF DI (GLOVE) ×2 IMPLANT
GLOVE SURG UNDER POLY LF SZ6.5 (GLOVE) ×6 IMPLANT
GLOVE SURG UNDER POLY LF SZ8 (GLOVE) ×2
GOWN STRL REUS W/ TWL LRG LVL3 (GOWN DISPOSABLE) ×1 IMPLANT
GOWN STRL REUS W/ TWL XL LVL3 (GOWN DISPOSABLE) ×1 IMPLANT
GOWN STRL REUS W/TWL 2XL LVL3 (GOWN DISPOSABLE) IMPLANT
GOWN STRL REUS W/TWL LRG LVL3 (GOWN DISPOSABLE) ×1
GOWN STRL REUS W/TWL XL LVL3 (GOWN DISPOSABLE) ×1
HEMOSTAT SURGICEL 2X14 (HEMOSTASIS) IMPLANT
KIT BASIN OR (CUSTOM PROCEDURE TRAY) ×2 IMPLANT
KIT TURNOVER KIT B (KITS) ×2 IMPLANT
NEEDLE SPNL 22GX3.5 QUINCKE BK (NEEDLE) ×2 IMPLANT
NS IRRIG 1000ML POUR BTL (IV SOLUTION) ×2 IMPLANT
OIL CARTRIDGE MAESTRO DRILL (MISCELLANEOUS) ×2
PACK LAMINECTOMY NEURO (CUSTOM PROCEDURE TRAY) ×2 IMPLANT
PAD ARMBOARD 7.5X6 YLW CONV (MISCELLANEOUS) ×6 IMPLANT
PATTIES SURGICAL .25X.25 (GAUZE/BANDAGES/DRESSINGS) IMPLANT
PATTIES SURGICAL .5 X3 (DISPOSABLE) ×2 IMPLANT
PATTIES SURGICAL 1/4 X 3 (GAUZE/BANDAGES/DRESSINGS) ×2 IMPLANT
SPECIMEN JAR SMALL (MISCELLANEOUS) IMPLANT
SPONGE LAP 4X18 RFD (DISPOSABLE) IMPLANT
SPONGE NEURO XRAY DETECT 1X3 (DISPOSABLE) IMPLANT
SPONGE SURGIFOAM ABS GEL 100 (HEMOSTASIS) ×2 IMPLANT
STRIP CLOSURE SKIN 1/2X4 (GAUZE/BANDAGES/DRESSINGS) ×2 IMPLANT
SUT PROLENE 6 0 BV (SUTURE) IMPLANT
SUT VIC AB 0 CT1 18XCR BRD8 (SUTURE) ×1 IMPLANT
SUT VIC AB 0 CT1 8-18 (SUTURE) ×1
SUT VIC AB 2-0 CP2 18 (SUTURE) ×2 IMPLANT
SUT VIC AB 2-0 CT1 18 (SUTURE) ×2 IMPLANT
SWAB COLLECTION DEVICE MRSA (MISCELLANEOUS) IMPLANT
SWAB CULTURE ESWAB REG 1ML (MISCELLANEOUS) IMPLANT
TOWEL GREEN STERILE (TOWEL DISPOSABLE) ×2 IMPLANT
TOWEL GREEN STERILE FF (TOWEL DISPOSABLE) ×2 IMPLANT
TRAY FOLEY MTR SLVR 16FR STAT (SET/KITS/TRAYS/PACK) IMPLANT
WATER STERILE IRR 1000ML POUR (IV SOLUTION) ×2 IMPLANT

## 2020-10-15 NOTE — Progress Notes (Signed)
[facility] Infectious Diseases Follow Up Note  Patient Identification: Patient Name: Derek Woods MRN: [id number] Admit Date: 10/07/2020  9:45 AM Age: 30 y.o.Today's Date: 10/15/2020   Reason for Visit: Follow-up on epidural abscess and empyema  Principal Problem:   Empyema lung (HCC) Active Problems:   Polysubstance dependence including opioid type drug with complication, episodic abuse (HCC)   Morbid obesity with BMI of 50.0-59.9, adult (HCC)   Tobacco dependence   MRSA infection   Antibiotics: Vancomycin 4/26-current                    Total days of antibiotics 9  Lines/Tubes: PICC right arm 4/27, PIV's, right chest tube 1 and 2  Interval Events: Afebrile, no leukocytosis, hemodynamically stable.  Plan to go to the OR today with neurosurgery   Assessment MRSA pneumonia/empyema status post right VATS/thoracotomy 4/26, culture MRSA CT surgery following TTE 4/27  no vegetations Blood cx 4/24 NG in 5 days   T11-T12 discitis and osteomyelitis, epidural abscess (0.7 x 1.6 x 2.1 cm)  IVDU Smoking  Recommendations Continue vancomycin for now I do not see an MRI lumbar spine ordered although it has been mentioned as PENDING in chart review.  I have ordered an MRI lumbar spine with and without contrast this morning  Follow-up neurosurgery recommendation, planned to go to OR today.  Monitor CBC BMP and vancomycin trough Check HCV ab ( ordered) Following   Rest of the management as per the primary team. Thank you for the consult. Please page with pertinent questions or concerns.  ______________________________________________________________________ Subjective patient seen and examined at the bedside. Lying in bed, asking for pain medications. Denies nausea, vomiting, diarrhea  Vitals BP 136/84 (BP Location: Left Wrist)   Pulse 90   Temp 98.3 F (36.8 C) (Oral)   Resp 18   Ht 5\' 6"  (1.676 m)   Wt (!) 146.7 kg    SpO2 99%   BMI 52.20 kg/m       Physical Exam Constitutional:  Lying in be, not in acute distress     Comments:   Cardiovascular:     Rate and Rhythm: Normal rate and regular rhythm.     Heart sounds: RRR  Pulmonary:     Effort: Pulmonary effort is normal.     Comments: 2 chest tubes in the rt side, decreased air entry in the rt side, rales +  Abdominal:     Palpations: Abdomen is soft.     Tenderness: Non tender   Musculoskeletal:        General: No swelling or tenderness.   Skin:    Comments: no obvious rashes/lesions   Neurological:     General: grossly non focal   Psychiatric:        Mood and Affect: Mood normal.    Pertinent Microbiology Results for orders placed or performed during the hospital encounter of 10/07/20  Culture, blood (routine x 2)     Status: None   Collection Time: 10/07/20 11:01 AM   Specimen: BLOOD LEFT HAND  Result Value Ref Range Status   Specimen Description BLOOD LEFT HAND  Final   Special Requests   Final    BOTTLES DRAWN AEROBIC ONLY Blood Culture results may not be optimal due to an inadequate volume of blood received in culture bottles   Culture   Final    NO GROWTH 5 DAYS Performed at [facility], [address], [place], [place]    Report Status  10/12/2020 FINAL  Final  Culture, blood (routine x 2)     Status: None   Collection Time: 10/07/20 11:14 AM   Specimen: BLOOD RIGHT HAND  Result Value Ref Range Status   Specimen Description BLOOD RIGHT HAND  Final   Special Requests   Final    BOTTLES DRAWN AEROBIC ONLY Blood Culture results may not be optimal due to an inadequate volume of blood received in culture bottles   Culture   Final    NO GROWTH 5 DAYS Performed at [facility], [address] [place], [place] [address]    Report Status 10/12/2020 FINAL  Final  Surgical PCR screen     Status: Abnormal   Collection Time: 10/08/20  8:58 PM   Specimen: Nasal Mucosa; Nasal Swab  Result Value Ref  Range Status   MRSA, PCR POSITIVE (A) NEGATIVE Final    Comment: RESULT CALLED TO, READ BACK BY AND VERIFIED WITH: O.P. RN 10/09/20 0015 JDW    Staphylococcus aureus POSITIVE (A) NEGATIVE Final    Comment: (NOTE) The Xpert SA Assay (FDA approved for NASAL specimens in patients 32 years of age and older), is one component of a comprehensive surveillance program. It is not intended to diagnose infection nor to guide or monitor treatment. Performed at [facility], [address] [place], [place] [address]   Aerobic/Anaerobic Culture w Gram Stain (surgical/deep wound)     Status: None (Preliminary result)   Collection Time: 10/09/20  3:46 PM   Specimen: PATH Cytology Pleural fluid; Body Fluid  Result Value Ref Range Status   Specimen Description FLUID PLEURAL RIGHT  Final   Special Requests NONE  Final   Gram Stain   Final    RARE WBC PRESENT,BOTH PMN AND MONONUCLEAR FEW GRAM POSITIVE COCCI IN PAIRS IN CLUSTERS Performed at [facility], [address] [place], [place] [address]    Culture   Final    ABUNDANT METHICILLIN RESISTANT STAPHYLOCOCCUS AUREUS NO ANAEROBES ISOLATED; CULTURE IN PROGRESS FOR 5 DAYS    Report Status PENDING  Incomplete   Organism ID, Bacteria METHICILLIN RESISTANT STAPHYLOCOCCUS AUREUS  Final      Susceptibility   Methicillin resistant staphylococcus aureus - MIC*    CIPROFLOXACIN >=8 RESISTANT Resistant     ERYTHROMYCIN >=8 RESISTANT Resistant     GENTAMICIN <=0.5 SENSITIVE Sensitive     OXACILLIN >=4 RESISTANT Resistant     TETRACYCLINE >=16 RESISTANT Resistant     VANCOMYCIN <=0.5 SENSITIVE Sensitive     TRIMETH/SULFA <=10 SENSITIVE Sensitive     CLINDAMYCIN <=0.25 SENSITIVE Sensitive     RIFAMPIN <=0.5 SENSITIVE Sensitive     Inducible Clindamycin NEGATIVE Sensitive     * ABUNDANT METHICILLIN RESISTANT STAPHYLOCOCCUS AUREUS  Acid Fast Smear (AFB)     Status: None   Collection Time: 10/09/20  3:46 PM   Specimen: PATH Cytology Pleural  fluid; Body Fluid  Result Value Ref Range Status   AFB Specimen Processing Concentration  Final   Acid Fast Smear Negative  Final    Comment: (NOTE) Performed At: [facility] [address] [place], [place] [id number] Jolene SchimkeNagendra Sanjai MD OZ:[id number]:[phone]    Source (AFB) FLUID  Final    Comment: PLEURAL RIGHT Performed at [facility], [address] [place], [place] [address]   Aerobic/Anaerobic Culture w Gram Stain (surgical/deep wound)     Status: None (Preliminary result)   Collection Time: 10/09/20  3:52 PM   Specimen: Soft  Tissue, Other  Result Value Ref Range Status   Specimen Description TISSUE  Final   Special Requests PLEURAL PEEL  Final   Gram Stain   Final    RARE WBC PRESENT,BOTH PMN AND MONONUCLEAR RARE GRAM POSITIVE COCCI IN CLUSTERS Performed at [facility], [address], [place], [place] [address]    Culture   Final    MODERATE METHICILLIN RESISTANT STAPHYLOCOCCUS AUREUS NO ANAEROBES ISOLATED; CULTURE IN PROGRESS FOR 5 DAYS    Report Status PENDING  Incomplete   Organism ID, Bacteria METHICILLIN RESISTANT STAPHYLOCOCCUS AUREUS  Final      Susceptibility   Methicillin resistant staphylococcus aureus - MIC*    CIPROFLOXACIN >=8 RESISTANT Resistant     ERYTHROMYCIN >=8 RESISTANT Resistant     GENTAMICIN <=0.5 SENSITIVE Sensitive     OXACILLIN >=4 RESISTANT Resistant     TETRACYCLINE >=16 RESISTANT Resistant     VANCOMYCIN <=0.5 SENSITIVE Sensitive     TRIMETH/SULFA <=10 SENSITIVE Sensitive     CLINDAMYCIN <=0.25 SENSITIVE Sensitive     RIFAMPIN <=0.5 SENSITIVE Sensitive     Inducible Clindamycin NEGATIVE Sensitive     * MODERATE METHICILLIN RESISTANT STAPHYLOCOCCUS AUREUS  Acid Fast Smear (AFB)     Status: None   Collection Time: 10/09/20  3:52 PM   Specimen: Soft Tissue, Other  Result Value Ref Range Status   AFB Specimen Processing Comment  Final    Comment: Tissue Grinding and Digestion/Decontamination   Acid Fast Smear Negative   Final    Comment: (NOTE) Performed At: [facility] [address] [place], [place] [id number] Jolene Schimke MD OV:[id number]    Source (AFB) TISSUE  Final    Comment: PLEURAL PEEL Performed at [facility], [address] [place], [place] [address]     Pertinent Lab. CBC Latest Ref Rng & Units 10/15/2020 10/14/2020 10/13/2020  WBC 4.0 - 10.5 K/uL 6.1 7.2 6.2  Hemoglobin 13.0 - 17.0 g/dL 0.6(T) 0.1(S) 0.1(U)  Hematocrit 39.0 - 52.0 % 29.1(L) 30.7(L) 28.2(L)  Platelets 150 - 400 K/uL 269 312 262   CMP Latest Ref Rng & Units 10/15/2020 10/13/2020 10/12/2020  Glucose 70 - 99 mg/dL 932(T) 557(D) 220(U)  BUN 6 - 20 mg/dL 18 19 16   Creatinine 0.61 - 1.24 mg/dL 5.42 7.06  Sodium 135 - 145 mmol/L 136 134(L) 133(L)  Potassium 3.5 - 5.1 mmol/L 4.7 4.3 4.0  Chloride 98 - 111 mmol/L 100 100 100  CO2 22 - 32 mmol/L 30 28 28   Calcium 8.9 - 10.3 mg/dL 2.37) ) 6.2(G)  Total Protein 6.5 - 8.1 g/dL - 6.4(L) 6.7  Total Bilirubin 0.3 - 1.2 mg/dL - 0.6 0.5  Alkaline Phos 38 - 126 U/L - 107 103  AST 15 - 41 U/L - 32 27  ALT 0 - 44 U/L - 20 17     Pertinent Imaging today Plain films and CT images have been personally visualized and interpreted; radiology reports have been reviewed. Decision making incorporated into the Impression / Recommendations. TTE 10/08/20 1. Left ventricular ejection fraction, by estimation, is 60 to 65%. The left ventricle has normal function. The left ventricle has no regional wall motion abnormalities. Left ventricular diastolic parameters were normal. 2. Right ventricular systolic function is normal. The right ventricular size is normal. 3. The mitral valve is normal in structure. No evidence of mitral valve regurgitation. No evidence of mitral stenosis. 4. The aortic valve is normal in structure. Aortic valve regurgitation is not visualized.  No aortic stenosis is present. 5. The inferior vena cava is normal in size with greater than 50%  respiratory variability, suggesting right atrial pressure of 3 mmHg.  MRI thoracic spine 10/14/20 IMPRESSION: 1. 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 Associated epidural abscess within the right ventral epidural space measuring 0.7 x 1.6 x 2.1 cm, with resultant severe spinal stenosis and compression of the distal thoracic cord. No appreciable cord signal changes. 2. Complex right pleural effusion/empyema with associated atelectasis and/or consolidation, better evaluated on prior chest CT. 3. Additional multilevel degenerative spondylosis and disc protrusions as above, with resultant mild spinal stenosis at T10-11.  Critical Value/emergent results were called by telephone at the time of interpretation on 10/14/2020 at 2:21 am to provider Dr. Robb Matar, Who verbally acknowledged these results.   I have spent more than 35 minutes for this patient encounter including review of prior medical records, coordination of care  with greater than 50% of time being face to face/counseling and discussing diagnostics/treatment plan with the patient/family.  Electronically signed by:   Odette Fraction, MD Infectious Disease Physician [facility] for Infectious Disease Pager: [phone]

## 2020-10-15 NOTE — Anesthesia Postprocedure Evaluation (Signed)
Anesthesia Post Note  Patient: Derek Woods  Procedure(s) Performed: THORACIC DECOMPRESSION LAMINECTOMY FOR EPIDURAL ABSCESS THORACIC ELEVEN- -THORACIC TWELVE (N/A )     Patient location during evaluation: PACU Anesthesia Type: General Level of consciousness: awake and alert Pain management: pain level controlled Vital Signs Assessment: post-procedure vital signs reviewed and stable Respiratory status: spontaneous breathing, nonlabored ventilation, respiratory function stable and patient connected to nasal cannula oxygen Cardiovascular status: blood pressure returned to baseline and stable Postop Assessment: no apparent nausea or vomiting Anesthetic complications: no   No complications documented.  Last Vitals:  Vitals:   10/15/20 1645 10/15/20 1700  BP: (!) 147/89 (!) 147/92  Pulse: 85 86  Resp: 19 18  Temp: 36.6 C   SpO2: 95% 96%    Last Pain:  Vitals:   10/15/20 1700  TempSrc:   PainSc: 5                  Beryle Lathe

## 2020-10-15 NOTE — Anesthesia Preprocedure Evaluation (Addendum)
Anesthesia Evaluation  Patient identified by MRN, date of birth, ID band Patient awake    Reviewed: Allergy & Precautions, NPO status , Patient's Chart, lab work & pertinent test results  History of Anesthesia Complications Negative for: history of anesthetic complications  Airway Mallampati: II  TM Distance: >3 FB Neck ROM: Full    Dental  (+) Dental Advisory Given, Chipped   Pulmonary asthma , Current Smoker and Patient abstained from smoking.,   S/p right thoracotomy with chest tube placement for empyema    Pulmonary exam normal        Cardiovascular negative cardio ROS Normal cardiovascular exam     Neuro/Psych PSYCHIATRIC DISORDERS Anxiety  Epidural abscess     GI/Hepatic negative GI ROS, (+)     substance abuse  marijuana use and IV drug use,   Endo/Other  Morbid obesity  Renal/GU negative Renal ROS     Musculoskeletal negative musculoskeletal ROS (+) narcotic dependent  Abdominal   Peds  Hematology  (+) anemia ,   Anesthesia Other Findings Covid test negative   Reproductive/Obstetrics                           Anesthesia Physical Anesthesia Plan  ASA: III  Anesthesia Plan: General   Post-op Pain Management:    Induction: Intravenous  PONV Risk Score and Plan: 3 and Treatment may vary due to age or medical condition, Ondansetron, Dexamethasone and Midazolam  Airway Management Planned: Oral ETT  Additional Equipment: None  Intra-op Plan:   Post-operative Plan: Extubation in OR  Informed Consent: I have reviewed the patients History and Physical, chart, labs and discussed the procedure including the risks, benefits and alternatives for the proposed anesthesia with the patient or authorized representative who has indicated his/her understanding and acceptance.     Dental advisory given  Plan Discussed with: CRNA and Anesthesiologist  Anesthesia Plan Comments:         Anesthesia Quick Evaluation

## 2020-10-15 NOTE — Progress Notes (Signed)
6 Days Post-Op Procedure(s) (LRB): RIGHT VATS/ THORACOTOMY/EMPYEMA (Right) Subjective: Complains of back pain making it difficult to move around  Objective: Vital signs in last 24 hours: Temp:  [98.3 F (36.8 C)-98.7 F (37.1 C)] 98.3 F (36.8 C) (05/02 0340) Pulse Rate:  [90-100] 90 (05/02 0718) Cardiac Rhythm: Normal sinus rhythm (05/02 0340) Resp:  [18-21] 18 (05/02 0718) BP: (127-154)/(68-88) 136/84 (05/02 0340) SpO2:  [99 %] 99 % (05/02 0340)  Hemodynamic parameters for last 24 hours:    Intake/Output from previous day: 05/01 0701 - 05/02 0700 In: 1360 [P.O.:960; IV Piggyback:400] Out: 2250 [Urine:2250] Intake/Output this shift: No intake/output data recorded.  General appearance: alert and cooperative Heart: regular rate and rhythm, S1, S2 normal, no murmur Lungs: clear to auscultation bilaterally Wound: Aquacel chest dressing intact  Lab Results: Recent Labs    10/14/20 0331 10/15/20 0353  WBC 7.2 6.1  HGB 9.7* 9.3*  HCT 30.7* 29.1*  PLT 312 269   BMET:  Recent Labs    10/13/20 0339 10/15/20 0353  NA 134* 136  K 4.3 4.7  CL 100 100  CO2 28 30  GLUCOSE 123* 109*  BUN 19 18  CREATININE 0.76 0.82  CALCIUM 8.7* 8.7*    PT/INR:  Recent Labs    10/14/20 0901  LABPROT 13.5  INR 1.0   ABG    Component Value Date/Time   PHART 7.386 10/10/2020 0458   HCO3 28.7 (H) 10/10/2020 0458   TCO2 30 10/10/2020 0458   O2SAT 99.0 10/10/2020 0458   CBG (last 3)  No results for input(s): GLUCAP in the last 72 hours.  Assessment/Plan:  POD 6  Stable from a thoracic surgery perspective. CXR after chest tube removal looks good. Continue IS, mobilization as neurosurgery procedure allows. Remove Aquacel chest dressing. Antibiotics per ID.   LOS: 8 days    [name] 10/15/2020

## 2020-10-15 NOTE — Transfer of Care (Signed)
Immediate Anesthesia Transfer of Care Note  Patient: Derek Woods  Procedure(s) Performed: THORACIC DECOMPRESSION LAMINECTOMY FOR EPIDURAL ABSCESS THORACIC ELEVEN- -THORACIC TWELVE (N/A )  Patient Location: PACU  Anesthesia Type:General  Level of Consciousness: awake, alert , oriented and patient cooperative  Airway & Oxygen Therapy: Patient Spontanous Breathing and Patient connected to face mask oxygen  Post-op Assessment: Report given to RN, Post -op Vital signs reviewed and stable and Patient moving all extremities X 4  Post vital signs: Reviewed and stable  Last Vitals:  Vitals Value Taken Time  BP 161/94 10/15/20 1545  Temp    Pulse 103 10/15/20 1548  Resp 24 10/15/20 1548  SpO2 95 % 10/15/20 1548  Vitals shown include unvalidated device data.  Last Pain:  Vitals:   10/15/20 0718  TempSrc:   PainSc: Asleep      Patients Stated Pain Goal: 4 (10/12/20 1919)  Complications: No complications documented.

## 2020-10-15 NOTE — Progress Notes (Signed)
Pharmacy Antibiotic Note  Derek Woods is a 30 y.o. male admitted on 10/07/2020 with loculated pleural effusion with empyema now s/p VATS and thoracotomy. Patient screened MRSA positive. Pharmacy has been consulted for vancomycin and cefepime dosing.  WBC wnl, afebrile. Renal function stable, Scr 0.8, CrCl 181 ml/min. Cultures growing MRSA. Plan for laminectomy at T11-12 by neurosurgery. Will get levels this week to make sure therapeutic.   Plan: Continue vancomycin 2000mg  IV q24 hrs  Monitor renal function, culture sensitivities, vanc level at Css   Height: 5\' 6"  (167.6 cm) Weight: (!) 146.7 kg (323 lb 6.4 oz) IBW/kg (Calculated) : 63.8  Temp (24hrs), Avg:98.5 F (36.9 C), Min:98.3 F (36.8 C), Max:98.7 F (37.1 C)  Recent Labs  Lab 10/10/20 0422 10/11/20 0435 10/11/20 1218 10/12/20 0435 10/13/20 0339 10/14/20 0331 10/15/20 0353  WBC 7.7 6.4  --  6.0 6.2 7.2 6.1  CREATININE 0.81 0.90  --  0.81 0.76  --  0.82  VANCOTROUGH  --   --  30*  --   --   --   --   VANCORANDOM  --   --   --  11  --   --   --     Estimated Creatinine Clearance: 179.1 mL/min (by C-G formula based on SCr of 0.82 mg/dL).    Allergies  Allergen Reactions  . Augmentin [Amoxicillin-Pot Clavulanate] Other (See Comments)    Pt does not recall, childhood  . Ceclor [Cefaclor] Other (See Comments)    Pt does not recall, childhood  . Sulfa Antibiotics Hives    Antimicrobials this admission: 4/24 vancomycin >> 4/24 cefepime >> 4/28  Dose adjustments this admission: 4/28 Vanc random 30 - hold vancomycin 4/29 Vanc random 11 - start 2000mg  IV q24h  Microbiology results: 4/24 BCx: ngtd (collected prior to abx) 4/25 MRSA +  4/26 pleural tissue: staph aureus > MRSA 4/26 pleural fluid: staph aureus > MRSA  5/25, PharmD, BCCCP Clinical Pharmacist  Phone: [phone] 10/15/2020 11:38 AM  Please check AMION for all [facility] Pharmacy phone numbers After 10:00 PM, call Main Pharmacy [phone]

## 2020-10-15 NOTE — Progress Notes (Signed)
PROGRESS NOTE    [name]  NWG:[id number] DOB: 05/06/1991 DOA: 10/07/2020 PCP: Pcp, No   Brief Narrative:  Derek Woods is a 30 y.o. M, with a pertinent pmx of daily heroin IVDA (previous suboxone use), asthma, morbid obesity (BMI 52), 1 PPD smoking, possible OSA, presented to [facility] on 4/24 with complaints of back pain, right flank pain, and one week with shortness of breath . CT of his chest revealed a large right empyema. He was admitted to [facility] by the hospitalist team.  He was taken to the OR on 4/26 with Dr. Laneta Simmers for a VATS and drainage of right empyema, however a right thoracotomy had to be prefomed. He was a difficult intubation for the procedure and he was unable to be weaned from the ventilator post operatively. There was reported difficulty with oxygenation during the procedure. PCCM was consulted for ventilator management who assumed care as primary team.  Patient was extubated on 10/10/2020 and was transferred under [facility] care on 10/11/2020.  Posterior chest tube was removed on 10/12/2020.  Thoracotomy culture growing MRSA.  Antibiotics de-escalated to vancomycin.  ID consulted on 10/13/2020.  MRI thoracic spine was done which showed T11/T12 discitis and possible abscess.   Assessment & Plan:   Principal Problem:   Empyema lung (HCC) Active Problems:   Polysubstance dependence including opioid type drug with complication, episodic abuse (HCC)   Morbid obesity with BMI of 50.0-59.9, adult (HCC)   Tobacco dependence   MRSA infection  Acute respiratory failure with hypercarbia and hypoxia/right empyema s/p right thoracotomy on 10/09/2020/suspected OSA/OHS: Patient required intubation on 10/09/2020 and was extubated on 10/10/2020.  S/p thoracotomy as well as chest tube, one of them was removed few days ago and the other 1 removed on 10/14/2020. Cultures growing MRSA.  He is on vancomycin which we will continue.  Seen by ID.  Cardiothoracic surgery signed off.  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  Has tenderness at upper lumbar vertebrae area as well.  MRI lumbar spine ordered but not completed yet.  Reportedly he is scheduled to have laminectomy at T11-12 by neurosurgery today.  Hypotension: Blood pressure remains elevated.  Will start on amlodipine 5 mg scheduled and as needed IV hydralazine.  Polysubstance abuse: Continue Suboxone at current dose.  Normocytic anemia: Hemoglobin is stable over 9.  Watch daily.  Tobacco dependence: Smoking cessation counseling provided.  DVT prophylaxis: SCD's Start: 10/09/20 1716   Code Status: Full Code  Family Communication: None present at bedside.  Plan of care discussed with patient in length and he verbalized understanding and agreed with it.  Status is: Inpatient  Remains inpatient appropriate because:Inpatient level of care appropriate due to severity of illness   Dispo: The patient is from: Home              Anticipated d/c is to: Home              Patient currently is not medically stable to d/c.   Difficult to place patient No        Estimated body mass index is 52.2 kg/m as calculated from the following:   Height as of this encounter: 5\' 6"  (1.676 m).   Weight as of this encounter: 146.7 kg.      Nutritional status:               Consultants:   CT surgery  ID  Neurosurgery  Procedures:   Chest tube placement, thoracotomy and intubation  Antimicrobials:  Anti-infectives (From admission, onward)   Start     Dose/Rate Route Frequency Ordered Stop   10/13/20 1200  vancomycin (VANCOREADY) IVPB 2000 mg/400 mL        2,000 mg 200 mL/hr over 120 Minutes Intravenous Every 24 hours 10/12/20 1115     10/12/20 1215  vancomycin (VANCOREADY) IVPB 500 mg/100 mL        500 mg 100 mL/hr over 60 Minutes Intravenous  Once 10/12/20 1115 10/12/20 1329   10/12/20 0600  vancomycin (VANCOREADY) IVPB 1500 mg/300 mL  Status:  Discontinued        1,500 mg 150 mL/hr over 120 Minutes  Intravenous Every 24 hours 10/12/20 0541 10/12/20 0556   10/12/20 0600  vancomycin (VANCOREADY) IVPB 1500 mg/300 mL  Status:  Discontinued        1,500 mg 150 mL/hr over 120 Minutes Intravenous Every 18 hours 10/12/20 0556 10/12/20 1115   10/11/20 1343  vancomycin variable dose per unstable renal function (pharmacist dosing)  Status:  Discontinued         Does not apply See admin instructions 10/11/20 1344 10/12/20 0556   10/10/20 0400  vancomycin (VANCOREADY) IVPB 1250 mg/250 mL  Status:  Discontinued        1,250 mg 166.7 mL/hr over 90 Minutes Intravenous Every 8 hours 10/09/20 1835 10/11/20 1344   10/09/20 2000  vancomycin (VANCOREADY) IVPB 1250 mg/250 mL  Status:  Discontinued        1,250 mg 166.7 mL/hr over 90 Minutes Intravenous Every 8 hours 10/09/20 1117 10/09/20 1835   10/09/20 1900  vancomycin (VANCOCIN) 2,500 mg in sodium chloride 0.9 % 500 mL IVPB        2,500 mg 250 mL/hr over 120 Minutes Intravenous  Once 10/09/20 1835 10/09/20 2137   10/09/20 1200  vancomycin (VANCOCIN) 2,500 mg in sodium chloride 0.9 % 500 mL IVPB  Status:  Discontinued        2,500 mg 250 mL/hr over 120 Minutes Intravenous  Once 10/09/20 1113 10/09/20 1835   10/07/20 2200  vancomycin (VANCOREADY) IVPB 2000 mg/400 mL  Status:  Discontinued        2,000 mg 200 mL/hr over 120 Minutes Intravenous Every 12 hours 10/07/20 1251 10/07/20 1437   10/07/20 2000  ceFEPIme (MAXIPIME) 2 g in sodium chloride 0.9 % 100 mL IVPB  Status:  Discontinued        2 g 200 mL/hr over 30 Minutes Intravenous Every 8 hours 10/07/20 1251 10/11/20 1342   10/07/20 1145  ceFEPIme (MAXIPIME) 2 g in sodium chloride 0.9 % 100 mL IVPB        2 g 200 mL/hr over 30 Minutes Intravenous  Once 10/07/20 1048 10/07/20 1351   10/07/20 1100  vancomycin (VANCOCIN) 2,500 mg in sodium chloride 0.9 % 500 mL IVPB  Status:  Discontinued        2,500 mg 250 mL/hr over 120 Minutes Intravenous  Once 10/07/20 1048 10/07/20 1437          Subjective: Seen and examined.  Does not feel well.  Did not sleep well.  Feels tired.  Worried about the surgery as well.  No other complaint.  No shortness of breath.  Objective: Vitals:   10/14/20 1921 10/14/20 2307 10/15/20 0340 10/15/20 0718  BP: 127/68 (!) 146/87 136/84   Pulse: 98 97 94 90  Resp: 19 20 20 18   Temp: 98.7 F (37.1 C) 98.6 F (37 C) 98.3 F (36.8 C)   TempSrc: Oral Oral Oral  SpO2: 99% 99% 99%   Weight:      Height:        Intake/Output Summary (Last 24 hours) at 10/15/2020 1610 Last data filed at 10/14/2020 2307 Gross per 24 hour  Intake 1240 ml  Output 2250 ml  Net -1010 ml   Filed Weights   10/07/20 1006  Weight: (!) 146.7 kg    Examination:  General exam: Appears calm and comfortable, morbidly obese Respiratory system: Clear to auscultation. Respiratory effort normal. Cardiovascular system: S1 & S2 heard, RRR. No JVD, murmurs, rubs, gallops or clicks. No pedal edema. Gastrointestinal system: Abdomen is nondistended, soft and nontender. No organomegaly or masses felt. Normal bowel sounds heard. Central nervous system: Alert and oriented. No focal neurological deficits. Extremities: Symmetric 5 x 5 power.  Low midline back tenderness Skin: No rashes, lesions or ulcers.  Psychiatry: Judgement and insight appear normal. Mood & affect appropriate.   Data Reviewed: I have personally reviewed following labs and imaging studies  CBC: Recent Labs  Lab 10/11/20 0435 10/12/20 0435 10/13/20 0339 10/14/20 0331 10/15/20 0353  WBC 6.4 6.0 6.2 7.2 6.1  NEUTROABS  --  4.0 3.7 4.8 3.5  HGB 9.1* 9.4* 9.1* 9.7* 9.3*  HCT 28.7* 29.6* 28.2* 30.7* 29.1*  MCV 94.1 94.6 93.4 94.5 93.9  PLT 297 299 262 312 269   Basic Metabolic Panel: Recent Labs  Lab 10/10/20 0422 10/10/20 0458 10/11/20 0435 10/12/20 0435 10/13/20 0339 10/15/20 0353  NA 134* 138 134* 133* 134* 136  K 5.1 4.6 4.2 4.0 4.3 4.7  CL 101  --  98 100 100 100  CO2 28  --  GLUCOSE 279*  --  110* 120* 123* 109*  BUN 14  --  CREATININE 0.81  --  0.90 0.81 0.76 0.82  CALCIUM 9.0  --  8.8* 8.6* 8.7* 8.7*  MG 1.9  --   --   --   --   --    GFR: Estimated Creatinine Clearance: 179.1 mL/min (by C-G formula based on SCr of 0.82 mg/dL). Liver Function Tests: Recent Labs  Lab 10/11/20 0435 10/12/20 0435 10/13/20 0339  AST 25 27 32  ALT ALKPHOS 92 103 107  BILITOT 0.5 0.5 0.6  PROT 6.6 6.7 6.4*  ALBUMIN 2.2* 2.2* 2.1*   No results for input(s): LIPASE, AMYLASE in the last 168 hours. No results for input(s): AMMONIA in the last 168 hours. Coagulation Profile: Recent Labs  Lab 10/14/20 0901  INR 1.0   Cardiac Enzymes: No results for input(s): CKTOTAL, CKMB, CKMBINDEX, TROPONINI in the last 168 hours. BNP (last 3 results) No results for input(s): PROBNP in the last 8760 hours. HbA1C: No results for input(s): HGBA1C in the last 72 hours. CBG: Recent Labs  Lab 10/10/20 0455 10/10/20 0817 10/10/20 1140 10/11/20 1128 10/11/20 1559  GLUCAP 176* 115* 121* 140* 128*   Lipid Profile: No results for input(s): CHOL, HDL, LDLCALC, TRIG, CHOLHDL, LDLDIRECT in the last 72 hours. Thyroid Function Tests: No results for input(s): TSH, T4TOTAL, FREET4, T3FREE, THYROIDAB in the last 72 hours. Anemia Panel: No results for input(s): VITAMINB12, FOLATE, FERRITIN, TIBC, IRON, RETICCTPCT in the last 72 hours. Sepsis Labs: No results for input(s): PROCALCITON, LATICACIDVEN in the last 168 hours.  Recent Results (from the past 240 hour(s))  Culture, blood (routine x 2)     Status: None   Collection Time: 10/07/20 11:01 AM   Specimen: BLOOD  LEFT HAND  Result Value Ref Range Status   Specimen Description BLOOD LEFT HAND  Final   Special Requests   Final    BOTTLES DRAWN AEROBIC ONLY Blood Culture results may not be optimal due to an inadequate volume of blood received in culture bottles   Culture   Final    NO GROWTH 5 DAYS Performed  at [facility], [address], [place], [place] [address]    Report Status 10/12/2020 FINAL  Final  Culture, blood (routine x 2)     Status: None   Collection Time: 10/07/20 11:14 AM   Specimen: BLOOD RIGHT HAND  Result Value Ref Range Status   Specimen Description BLOOD RIGHT HAND  Final   Special Requests   Final    BOTTLES DRAWN AEROBIC ONLY Blood Culture results may not be optimal due to an inadequate volume of blood received in culture bottles   Culture   Final    NO GROWTH 5 DAYS Performed at [facility], [address] [place], [place] [address]    Report Status 10/12/2020 FINAL  Final  Surgical PCR screen     Status: Abnormal   Collection Time: 10/08/20  8:58 PM   Specimen: Nasal Mucosa; Nasal Swab  Result Value Ref Range Status   MRSA, PCR POSITIVE (A) NEGATIVE Final    Comment: RESULT CALLED TO, READ BACK BY AND VERIFIED WITH: O.P. RN 10/09/20 0015 JDW    Staphylococcus aureus POSITIVE (A) NEGATIVE Final    Comment: (NOTE) The Xpert SA Assay (FDA approved for NASAL specimens in patients 59 years of age and older), is one component of a comprehensive surveillance program. It is not intended to diagnose infection nor to guide or monitor treatment. Performed at [facility], [address] [place], [place] [address]   Aerobic/Anaerobic Culture w Gram Stain (surgical/deep wound)     Status: None (Preliminary result)   Collection Time: 10/09/20  3:46 PM   Specimen: PATH Cytology Pleural fluid; Body Fluid  Result Value Ref Range Status   Specimen Description FLUID PLEURAL RIGHT  Final   Special Requests NONE  Final   Gram Stain   Final    RARE WBC PRESENT,BOTH PMN AND MONONUCLEAR FEW GRAM POSITIVE COCCI IN PAIRS IN CLUSTERS Performed at [facility], [address], [place], [place] [address]    Culture   Final    ABUNDANT METHICILLIN RESISTANT STAPHYLOCOCCUS AUREUS NO ANAEROBES ISOLATED; CULTURE IN PROGRESS FOR 5 DAYS    Report Status  PENDING  Incomplete   Organism ID, Bacteria METHICILLIN RESISTANT STAPHYLOCOCCUS AUREUS  Final      Susceptibility   Methicillin resistant staphylococcus aureus - MIC*    CIPROFLOXACIN >=8 RESISTANT Resistant     ERYTHROMYCIN >=8 RESISTANT Resistant     GENTAMICIN <=0.5 SENSITIVE Sensitive     OXACILLIN >=4 RESISTANT Resistant     TETRACYCLINE >=16 RESISTANT Resistant     VANCOMYCIN <=0.5 SENSITIVE Sensitive     TRIMETH/SULFA <=10 SENSITIVE Sensitive     CLINDAMYCIN <=0.25 SENSITIVE Sensitive     RIFAMPIN <=0.5 SENSITIVE Sensitive     Inducible Clindamycin NEGATIVE Sensitive     * ABUNDANT METHICILLIN RESISTANT STAPHYLOCOCCUS AUREUS  Acid Fast Smear (AFB)     Status: None   Collection Time: 10/09/20  3:46 PM   Specimen: PATH Cytology Pleural fluid; Body Fluid  Result Value Ref Range Status   AFB Specimen Processing Concentration  Final   Acid Fast  Smear Negative  Final    Comment: (NOTE) Performed At: [facility] [address] [place], [place] [id number] Jolene Schimke MD YQ:[id number]    Source (AFB) FLUID  Final    Comment: PLEURAL RIGHT Performed at [facility], [address] [place], [place] [address]   Aerobic/Anaerobic Culture w Gram Stain (surgical/deep wound)     Status: None (Preliminary result)   Collection Time: 10/09/20  3:52 PM   Specimen: Soft Tissue, Other  Result Value Ref Range Status   Specimen Description TISSUE  Final   Special Requests PLEURAL PEEL  Final   Gram Stain   Final    RARE WBC PRESENT,BOTH PMN AND MONONUCLEAR RARE GRAM POSITIVE COCCI IN CLUSTERS Performed at [facility], [address] [place], [place] [address]    Culture   Final    MODERATE METHICILLIN RESISTANT STAPHYLOCOCCUS AUREUS NO ANAEROBES ISOLATED; CULTURE IN PROGRESS FOR 5 DAYS    Report Status PENDING  Incomplete   Organism ID, Bacteria METHICILLIN RESISTANT STAPHYLOCOCCUS AUREUS  Final      Susceptibility   Methicillin resistant staphylococcus  aureus - MIC*    CIPROFLOXACIN >=8 RESISTANT Resistant     ERYTHROMYCIN >=8 RESISTANT Resistant     GENTAMICIN <=0.5 SENSITIVE Sensitive     OXACILLIN >=4 RESISTANT Resistant     TETRACYCLINE >=16 RESISTANT Resistant     VANCOMYCIN <=0.5 SENSITIVE Sensitive     TRIMETH/SULFA <=10 SENSITIVE Sensitive     CLINDAMYCIN <=0.25 SENSITIVE Sensitive     RIFAMPIN <=0.5 SENSITIVE Sensitive     Inducible Clindamycin NEGATIVE Sensitive     * MODERATE METHICILLIN RESISTANT STAPHYLOCOCCUS AUREUS  Acid Fast Smear (AFB)     Status: None   Collection Time: 10/09/20  3:52 PM   Specimen: Soft Tissue, Other  Result Value Ref Range Status   AFB Specimen Processing Comment  Final    Comment: Tissue Grinding and Digestion/Decontamination   Acid Fast Smear Negative  Final    Comment: (NOTE) Performed At: [facility] [address] [place], [place] [id number] Jolene Schimke MD YQ:[id number]    Source (AFB) TISSUE  Final    Comment: PLEURAL PEEL Performed at [facility], [address], [place], [place] [address]       Radiology Studies: MR THORACIC SPINE WO CONTRAST  Result Date: 10/14/2020 CLINICAL DATA:  Initial evaluation for acute MRSA infection. EXAM: MRI THORACIC SPINE WITHOUT CONTRAST TECHNIQUE: Multiplanar, multisequence MR imaging of the thoracic spine was performed. No intravenous contrast was administered. COMPARISON:  Prior CT from 10/06/2020 FINDINGS: Alignment: Examination degraded by motion artifact. Physiologic with preservation of the normal thoracic kyphosis. No listhesis. Vertebrae: Vertebral body height maintained without acute or chronic fracture. Bone marrow signal intensity diffusely decreased on T1 weighted imaging, nonspecific, but most commonly related to anemia, smoking, or obesity. No discrete or worrisome osseous lesions. Signal changes consistent with osteomyelitis discitis seen about the T11-12 interspace. There is associated disc space height loss with mild  endplate irregularity and reactive marrow edema. Associated epidural abscess within the right ventral epidural space measures approximately 0.7 x 1.6 x 2.1 cm (AP by transverse by craniocaudad) (series 24, image 40). Inferior extension to nearly the level of the T12-L1 interspace inferiorly. Secondary compression of the distal thoracic cord which is flattened and deviated to the left and posteriorly. Secondary severe spinal stenosis. No definite cord signal changes. Foramina remain grossly patent. Cord: Signal intensity within the thoracic spinal cord grossly within normal limits  on this motion degraded exam. No definite cord signal changes. Paraspinal and other soft tissues: Mild paraspinous edema adjacent to the T11-12 interspace. No loculated paraspinous collections. Complex right pleural effusion with associated atelectasis and/or consolidation partially visualize, better evaluated on prior chest CT. 9 mm cystic collection just to the right of midline in the subcutaneous fat at the level of T3-4 noted, likely a sebaceous cyst (series 25, image 13). An additional probable 1.2 cm sebaceous cyst noted at the level of the lower cervical spine (series 25, image 3). Disc levels: T1-2:  Unremarkable. T2-3: Unremarkable. T3-4: Left paracentral to subarticular disc protrusion indents the left ventral thecal sac. Mild flattening of the left ventral cord. Bilateral facet hypertrophy. No significant spinal stenosis. Foramina remain grossly patent. T4-5: Mild disc bulge with facet hypertrophy. No significant spinal stenosis. Foramina remain grossly patent. T5-6:  Negative interspace.  Mild facet hypertrophy.  No stenosis. T6-7: Small right foraminal disc protrusion. No significant spinal stenosis. Foramina remain patent. T7-8: Mild disc bulge with superimposed right paracentral to subarticular disc protrusion. Mild facet hypertrophy. Flattening of the right ventral thecal sac without significant spinal stenosis. Foramina  remain patent. T8-9: Mild disc bulge, asymmetric to the left. Mild facet hypertrophy. No significant spinal stenosis. Foramina remain patent. T9-10: Small left paracentral disc protrusion flattens the left ventral thecal sac. Mild facet hypertrophy. No significant spinal stenosis. Mild left foraminal narrowing. Right neural foramina remains patent. T10-11: Central disc protrusion flattens and indents the ventral thecal sac, slightly eccentric to the left (series 24, image thirty-six). Mild facet hypertrophy. Resultant mild spinal stenosis with minimal cord flattening, but no cord signal changes. Foramina remain patent. T11-12: Changes concerning for osteomyelitis discitis. 0.7 x 1.6 x 2.1 cm epidural abscess within the right ventral epidural space. Secondary compression of the distal thoracic cord which is flattened and slightly deviated posteriorly and to the left. No appreciable cord signal changes. Severe spinal stenosis with the thecal sac measuring 5 mm in AP diameter at its most narrow point. Epidural phlegmon and/or abscess likely extends laterally into the right neural foramen. No significant foraminal stenosis. T12-L1:  Negative. IMPRESSION: 1. 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 Associated epidural abscess within the right ventral epidural space measuring 0.7 x 1.6 x 2.1 cm, with resultant severe spinal stenosis and compression of the distal thoracic cord. No appreciable cord signal changes. 2. Complex right pleural effusion/empyema with associated atelectasis and/or consolidation, better evaluated on prior chest CT. 3. Additional multilevel degenerative spondylosis and disc protrusions as above, with resultant mild spinal stenosis at T10-11. Critical Value/emergent results were called by telephone at the time of interpretation on 10/14/2020 at 2:21 am to provider Dr. Robb Matar, Who verbally acknowledged these results. Electronically Signed   By: Rise Mu M.D.   On: 10/14/2020  02:22   DG CHEST PORT 1 VIEW  Result Date: 10/14/2020 CLINICAL DATA:  Post chest tube removal EXAM: PORTABLE CHEST 1 VIEW COMPARISON:  Oct 14, 2020 FINDINGS: Infiltrate in the right base is stable. The right chest tube is been removed. No pneumothorax. The cardiomediastinal silhouette is stable. The right PICC line is stable, in good position. The left lung is clear. IMPRESSION: 1. No pneumothorax after right chest tube removal. 2. Continued opacity/infiltrate in the right base. Electronically Signed   By: Gerome Sam III M.D   On: 10/14/2020 16:08   DG CHEST PORT 1 VIEW  Result Date: 10/14/2020 CLINICAL DATA:  Patient reports surgery of empyema on October 09, 2020. EXAM: PORTABLE  CHEST 1 VIEW COMPARISON:  October 13, 2020 FINDINGS: The right chest tube is stable. No pneumothorax. Opacity remains in the right mid and lower lung, similar in the interval. No other interval changes or acute abnormalities. The cardiomediastinal silhouette is stable. The left lung is clear. The right PICC line terminates in the SVC just below the brachiocephalic confluence. IMPRESSION: 1. Side port as above.  Right chest tube without pneumothorax. 2. Opacity remains in the right mid and lower lung, unchanged. Electronically Signed   By: Gerome Sam III M.D   On: 10/14/2020 08:33    Scheduled Meds: . amLODipine  5 mg Oral Daily  . buprenorphine-naloxone  1 tablet Sublingual BID  . Chlorhexidine Gluconate Cloth  6 each Topical Daily  . docusate sodium  100 mg Oral BID  . mouth rinse  15 mL Mouth Rinse BID  . nicotine  14 mg Transdermal Daily  . pantoprazole (PROTONIX) IV  40 mg Intravenous Daily  . polyethylene glycol  17 g Oral Daily  . senna-docusate  2 tablet Oral QHS  . sodium chloride flush  10-40 mL Intracatheter Q12H  . sodium chloride flush  3 mL Intravenous Q12H   Continuous Infusions: . sodium chloride 10 mL/hr at 10/12/20 1400  . vancomycin 2,000 mg (10/14/20 1148)     LOS: 8 days   Time spent: 29  minutes   Hughie Closs, MD [facility]  10/15/2020, 9:39 AM   To contact the attending provider between 7A-7P or the covering provider during after hours 7P-7A, please log into the web site [email or web address].

## 2020-10-15 NOTE — Progress Notes (Signed)
Subjective: Patient reports Unchanged exam still condition of back pain no lower extremity symptoms  Objective: Vital signs in last 24 hours: Temp:  [98.3 F (36.8 C)-98.7 F (37.1 C)] 98.3 F (36.8 C) (05/02 0340) Pulse Rate:  [90-99] 92 (05/02 1131) Resp:  [18-20] 20 (05/02 1131) BP: (127-154)/(68-94) 142/94 (05/02 1131) SpO2:  [99 %] 99 % (05/02 0340)  Intake/Output from previous day: 05/01 0701 - 05/02 0700 In: 1360 [P.O.:960; IV Piggyback:400] Out: 2250 [Urine:2250] Intake/Output this shift: No intake/output data recorded.  Strength out of 5 iliopsoas quads hamstrings gastrocs into tibialis, and EHL  Lab Results: Recent Labs    10/14/20 0331 10/15/20 0353  WBC 7.2 6.1  HGB 9.7* 9.3*  HCT 30.7* 29.1*  PLT 312 269   BMET Recent Labs    10/13/20 0339 10/15/20 0353  NA 134* 136  K 4.3 4.7  CL 100 100  CO2 28 30  GLUCOSE 123* 109*  BUN 19 18  CREATININE 0.76 0.82  CALCIUM 8.7* 8.7*    Studies/Results: MR THORACIC SPINE WO CONTRAST  Result Date: 10/14/2020 CLINICAL DATA:  Initial evaluation for acute MRSA infection. EXAM: MRI THORACIC SPINE WITHOUT CONTRAST TECHNIQUE: Multiplanar, multisequence MR imaging of the thoracic spine was performed. No intravenous contrast was administered. COMPARISON:  Prior CT from 10/06/2020 FINDINGS: Alignment: Examination degraded by motion artifact. Physiologic with preservation of the normal thoracic kyphosis. No listhesis. Vertebrae: Vertebral body height maintained without acute or chronic fracture. Bone marrow signal intensity diffusely decreased on T1 weighted imaging, nonspecific, but most commonly related to anemia, smoking, or obesity. No discrete or worrisome osseous lesions. Signal changes consistent with osteomyelitis discitis seen about the T11-12 interspace. There is associated disc space height loss with mild endplate irregularity and reactive marrow edema. Associated epidural abscess within the right ventral epidural  space measures approximately 0.7 x 1.6 x 2.1 cm (AP by transverse by craniocaudad) (series 24, image 40). Inferior extension to nearly the level of the T12-L1 interspace inferiorly. Secondary compression of the distal thoracic cord which is flattened and deviated to the left and posteriorly. Secondary severe spinal stenosis. No definite cord signal changes. Foramina remain grossly patent. Cord: Signal intensity within the thoracic spinal cord grossly within normal limits on this motion degraded exam. No definite cord signal changes. Paraspinal and other soft tissues: Mild paraspinous edema adjacent to the T11-12 interspace. No loculated paraspinous collections. Complex right pleural effusion with associated atelectasis and/or consolidation partially visualize, better evaluated on prior chest CT. 9 mm cystic collection just to the right of midline in the subcutaneous fat at the level of T3-4 noted, likely a sebaceous cyst (series 25, image 13). An additional probable 1.2 cm sebaceous cyst noted at the level of the lower cervical spine (series 25, image 3). Disc levels: T1-2:  Unremarkable. T2-3: Unremarkable. T3-4: Left paracentral to subarticular disc protrusion indents the left ventral thecal sac. Mild flattening of the left ventral cord. Bilateral facet hypertrophy. No significant spinal stenosis. Foramina remain grossly patent. T4-5: Mild disc bulge with facet hypertrophy. No significant spinal stenosis. Foramina remain grossly patent. T5-6:  Negative interspace.  Mild facet hypertrophy.  No stenosis. T6-7: Small right foraminal disc protrusion. No significant spinal stenosis. Foramina remain patent. T7-8: Mild disc bulge with superimposed right paracentral to subarticular disc protrusion. Mild facet hypertrophy. Flattening of the right ventral thecal sac without significant spinal stenosis. Foramina remain patent. T8-9: Mild disc bulge, asymmetric to the left. Mild facet hypertrophy. No significant spinal  stenosis. Foramina remain patent. T9-10:  Small left paracentral disc protrusion flattens the left ventral thecal sac. Mild facet hypertrophy. No significant spinal stenosis. Mild left foraminal narrowing. Right neural foramina remains patent. T10-11: Central disc protrusion flattens and indents the ventral thecal sac, slightly eccentric to the left (series 24, image thirty-six). Mild facet hypertrophy. Resultant mild spinal stenosis with minimal cord flattening, but no cord signal changes. Foramina remain patent. T11-12: Changes concerning for osteomyelitis discitis. 0.7 x 1.6 x 2.1 cm epidural abscess within the right ventral epidural space. Secondary compression of the distal thoracic cord which is flattened and slightly deviated posteriorly and to the left. No appreciable cord signal changes. Severe spinal stenosis with the thecal sac measuring 5 mm in AP diameter at its most narrow point. Epidural phlegmon and/or abscess likely extends laterally into the right neural foramen. No significant foraminal stenosis. T12-L1:  Negative. IMPRESSION: 1. 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 Associated epidural abscess within the right ventral epidural space measuring 0.7 x 1.6 x 2.1 cm, with resultant severe spinal stenosis and compression of the distal thoracic cord. No appreciable cord signal changes. 2. Complex right pleural effusion/empyema with associated atelectasis and/or consolidation, better evaluated on prior chest CT. 3. Additional multilevel degenerative spondylosis and disc protrusions as above, with resultant mild spinal stenosis at T10-11. Critical Value/emergent results were called by telephone at the time of interpretation on 10/14/2020 at 2:21 am to provider Dr. Robb Matar, Who verbally acknowledged these results. Electronically Signed   By: Rise Mu M.D.   On: 10/14/2020 02:22   DG CHEST PORT 1 VIEW  Result Date: 10/14/2020 CLINICAL DATA:  Post chest tube removal EXAM:  PORTABLE CHEST 1 VIEW COMPARISON:  Oct 14, 2020 FINDINGS: Infiltrate in the right base is stable. The right chest tube is been removed. No pneumothorax. The cardiomediastinal silhouette is stable. The right PICC line is stable, in good position. The left lung is clear. IMPRESSION: 1. No pneumothorax after right chest tube removal. 2. Continued opacity/infiltrate in the right base. Electronically Signed   By: Gerome Sam III M.D   On: 10/14/2020 16:08   DG CHEST PORT 1 VIEW  Result Date: 10/14/2020 CLINICAL DATA:  Patient reports surgery of empyema on October 09, 2020. EXAM: PORTABLE CHEST 1 VIEW COMPARISON:  October 13, 2020 FINDINGS: The right chest tube is stable. No pneumothorax. Opacity remains in the right mid and lower lung, similar in the interval. No other interval changes or acute abnormalities. The cardiomediastinal silhouette is stable. The left lung is clear. The right PICC line terminates in the SVC just below the brachiocephalic confluence. IMPRESSION: 1. Side port as above.  Right chest tube without pneumothorax. 2. Opacity remains in the right mid and lower lung, unchanged. Electronically Signed   By: Gerome Sam III M.D   On: 10/14/2020 08:33    Assessment/Plan: Patient with a T11-12 epidural abscess severe spinal stenosis.  We have recommended decompressive thoracic laminectomy T11-T12 for removal of epidural abscess have extensively gone over the risks and benefits of that operation with him as well as perioperative course expectations of outcome and alternatives of surgery and he understands and agrees to proceed forward forward  LOS: 8 days     Mariam Dollar 10/15/2020, 12:58 PM

## 2020-10-15 NOTE — Anesthesia Procedure Notes (Signed)
Procedure Name: Intubation Date/Time: 10/15/2020 1:45 PM Performed by: Claris Che, CRNA Pre-anesthesia Checklist: Patient identified, Emergency Drugs available, Suction available and Patient being monitored Patient Re-evaluated:Patient Re-evaluated prior to induction Oxygen Delivery Method: Circle System Utilized Preoxygenation: Pre-oxygenation with 100% oxygen Induction Type: IV induction Ventilation: Mask ventilation without difficulty Laryngoscope Size: Glidescope and 4 (Attempt DL with Mac 4, unable to visualize.) Grade View: Grade I Tube type: Oral Tube size: 8.0 mm Number of attempts: 1 Airway Equipment and Method: Rigid stylet and Video-laryngoscopy Placement Confirmation: ETT inserted through vocal cords under direct vision,  positive ETCO2 and breath sounds checked- equal and bilateral Secured at: 22 cm Tube secured with: Tape Dental Injury: Teeth and Oropharynx as per pre-operative assessment  Comments: Atraumatic oral intubation by Pierce Crane., SRNA with Glidescope.

## 2020-10-15 NOTE — Op Note (Addendum)
10/09/2020 Derek Woods [id number]  Surgeon: [name], MD   First Assistant: Jillyn Hidden, PA-C  Preoperative Diagnosis: Right empyema   Postoperative Diagnosis: Right empyema   Procedure:  1. Right thoracotomy  2. Drainage of empyema  3. Decortication of the right lung   Anesthesia: General Endotracheal   Clinical History/Surgical Indication:   The patient is a 30 year old gentleman with a history of morbid obesity, daily heroin abuse, and smoking who presents with about a 3-week history of progressive right back pain with development of worsening shortness of breath over the past week.  He said that he initially thought he had pulled a muscle and it would get better but it has not.  He denies any fever or chills.  He has had some cough but no sputum production. CT shows a large right empyema with a thick rim around the collection.  Given his obesity and the appearance of the empyema I think this would be best drained surgically with either VATS if possible or limited thoracotomy. Percutaneous catheter drainage is likely to be incomplete and difficult due to his obesity.  I discussed the operative procedure of right VATS and possible thoracotomy with him including alternatives, benefits, and risks including but not limited to bleeding, blood transfusion, persistent pleural space infection, prolonged air leak from the lung, incomplete expansion of the lung, and respiratory failure.  He understands and agrees to proceed.   Preparation:  The patient was seen in the preoperative holding area and the correct patient, correct operation, correct operative sidewere confirmed with the patient after reviewing the medical record and CT scan. The consent was signed by me. Preoperative antibiotics were given. The right side of the chest was signed by me. The patient was taken back to the operating room and positioned supine on the operating room table.  The patient was placed under general  anesthesia but it was not possible to get a double-lumen tube in the correct position.  After couple attempts it was difficult to get a single-lumen tube back in and once that was in position I felt it would be safest just to proceed with right thoracotomy using a single-lumen tube.  The patient was turned into the right lateral decubitus position. The chest was prepped with betadine soap and solution. A surgical time-out was taken and the correct patient,operative side, and operative procedure were confirmed with the nursing and anesthesia staff.   Operative Procedure:  A short lateral thoracotomy incision was made and the chest entered through the 7th ICS.  Unfortunately the lung was adherent to the chest wall at this location with dense adhesions and I could not separate them.  Therefore I decided to try 1 interspace above and the 6 intercostal space was opened directly into the empyema.  The pleural space was filled with a yellowish green pus. About 1500 cc was removed.  The empyema look like it had been there at least for several weeks.  The right lower lobe had a thick peel over it which was removed by decortication.  This allowed complete expansion of the lung. The chest was irrigated with warm saline. Hemostasis was complete. Two 36 F chest tubes were placed through separate stab incisions and were positioned posteriorly and anteriorly in the pleural space. The muscles were closed with continuous 0 vicryl suture. The subcutaneous tissue was closed with 2-0 vicryl continuous suture. The skin was closed with staples. All sponge, needle, and instrument counts were reported correct at the end of the case.  Dry sterile dressings were placed over the incisions and around the chest tubes which were connected to pleurevac suction. The patient was turned supine, extubated,then transported to the PACU in satisfactory and stable condition.

## 2020-10-16 ENCOUNTER — Encounter (HOSPITAL_COMMUNITY): Payer: Self-pay | Admitting: Neurosurgery

## 2020-10-16 ENCOUNTER — Inpatient Hospital Stay (HOSPITAL_COMMUNITY): Payer: Self-pay

## 2020-10-16 LAB — CBC WITH DIFFERENTIAL/PLATELET
Abs Immature Granulocytes: 0.05 10*3/uL (ref 0.00–0.07)
Basophils Absolute: 0 10*3/uL (ref 0.0–0.1)
Basophils Relative: 0 %
Eosinophils Absolute: 0 10*3/uL (ref 0.0–0.5)
Eosinophils Relative: 0 %
HCT: 29.5 % — ABNORMAL LOW (ref 39.0–52.0)
Hemoglobin: 9.5 g/dL — ABNORMAL LOW (ref 13.0–17.0)
Immature Granulocytes: 1 %
Lymphocytes Relative: 16 %
Lymphs Abs: 1.4 10*3/uL (ref 0.7–4.0)
MCH: 29.8 pg (ref 26.0–34.0)
MCHC: 32.2 g/dL (ref 30.0–36.0)
MCV: 92.5 fL (ref 80.0–100.0)
Monocytes Absolute: 0.2 10*3/uL (ref 0.1–1.0)
Monocytes Relative: 2 %
Neutro Abs: 6.9 10*3/uL (ref 1.7–7.7)
Neutrophils Relative %: 81 %
Platelets: 322 10*3/uL (ref 150–400)
RBC: 3.19 MIL/uL — ABNORMAL LOW (ref 4.22–5.81)
RDW: 13.8 % (ref 11.5–15.5)
WBC: 8.6 10*3/uL (ref 4.0–10.5)
nRBC: 0 % (ref 0.0–0.2)

## 2020-10-16 LAB — VANCOMYCIN, PEAK: Vancomycin Pk: 45 ug/mL — ABNORMAL HIGH (ref 30–40)

## 2020-10-16 LAB — SURGICAL PATHOLOGY

## 2020-10-16 LAB — HEPATITIS C ANTIBODY: HCV Ab: REACTIVE — AB

## 2020-10-16 MED ORDER — ALPRAZOLAM 0.25 MG PO TABS
0.2500 mg | ORAL_TABLET | Freq: Two times a day (BID) | ORAL | Status: AC | PRN
  Administered 2020-10-16 (×2): 0.25 mg via ORAL
  Filled 2020-10-16 (×2): qty 1

## 2020-10-16 MED ORDER — KETOROLAC TROMETHAMINE 30 MG/ML IJ SOLN
30.0000 mg | Freq: Four times a day (QID) | INTRAMUSCULAR | Status: AC | PRN
  Administered 2020-10-16 (×2): 30 mg via INTRAVENOUS
  Filled 2020-10-16 (×2): qty 1

## 2020-10-16 NOTE — Evaluation (Signed)
Occupational Therapy Evaluation Patient Details Name: Derek Woods MRN: [id number] DOB: [birth year] Today's Date: 10/16/2020    History of Present Illness [name] 30 yo male who presented on 10/07/20 with shortness of breath and found to have large R empyema.  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  Medical hx includes daily heroin IVDA (previous suboxone use), asthma, morbid obesity, OSA, and smokes.   Clinical Impression   PTA patient independent, not working or driving. Admitted for above and limited by problem list below, including back pain, precautions, impaired balance, decreased activity tolerance.  Patient educated on precautions, ADL compensatory techniques, mobility, DME and safety.  Patient requires mod assist +2 for bed mobility, min assist +2 for transfers and in room mobility using RW, up to max assist for LB ADLs.  He will benefit from further OT services while admitted and after dc at SNF level to optimize independence and return to PLOF.  Will follow acutely.     Follow Up Recommendations  SNF    Equipment Recommendations  3 in 1 bedside commode    Recommendations for Other Services       Precautions / Restrictions Precautions Precautions: Back Precaution Booklet Issued: No Precaution Comments: reviewed precautions with pt Required Braces or Orthoses:  (no brace needed order) Restrictions Weight Bearing Restrictions: No      Mobility Bed Mobility Overal bed mobility: Needs Assistance Bed Mobility: Rolling;Sidelying to Sit Rolling: Mod assist;+2 for safety/equipment Sidelying to sit: Mod assist;+2 for safety/equipment       General bed mobility comments: Mod A to roll and cues for log roll; increased time to lift trunk with cues for use of UE and assist to guide legs off  bed    Transfers Overall transfer level: Needs assistance Equipment used: Rolling walker (2 wheeled) Transfers: Sit to/from Stand Sit to Stand: Min assist;+2 safety/equipment;From elevated surface         General transfer comment: cueing for hand placement, posture; min assist +2 to power up and steady    Balance Overall balance assessment: Needs assistance Sitting-balance support: Bilateral upper extremity supported Sitting balance-Leahy Scale: Fair Sitting balance - Comments: Requiring UE support more for pain control   Standing balance support: Bilateral upper extremity supported;During functional activity;No upper extremity supported Standing balance-Leahy Scale: Poor Standing balance comment: Requiring UE support or leaning on counter when washing hands                           ADL either performed or assessed with clinical judgement   ADL Overall ADL's : Needs assistance/impaired     Grooming: Minimal assistance;Standing;Wash/dry hands   Upper Body Bathing: Set up;Sitting   Lower Body Bathing: Maximal assistance;Sit to/from stand   Upper Body Dressing : Set up;Sitting   Lower Body Dressing: Maximal assistance;Sit to/from stand   Toilet Transfer: Minimal assistance;+2 for safety/equipment;Ambulation;RW   Toileting- Clothing Manipulation and Hygiene: Moderate assistance;Sit to/from stand Toileting - Clothing Manipulation Details (indicate cue type and reason): utilized urinal seated with setup, would require increased assist with clothing mgmt and standing     Functional mobility during ADLs: Minimal assistance;Rolling walker;+2 for safety/equipment General ADL Comments: pt limited by pain, back precautions, body habitus     Vision   Vision Assessment?: No apparent visual deficits  Perception     Praxis      Pertinent Vitals/Pain Pain Assessment: 0-10 Pain Score:  (5/10 at rest, increased with activity) Pain Location: back Pain  Descriptors / Indicators: Discomfort;Operative site guarding;Shooting Pain Intervention(s): Limited activity within patient's tolerance;Monitored during session;Repositioned     Hand Dominance Right   Extremity/Trunk Assessment Upper Extremity Assessment Upper Extremity Assessment: Overall WFL for tasks assessed   Lower Extremity Assessment Lower Extremity Assessment: Defer to PT evaluation   Cervical / Trunk Assessment Cervical / Trunk Assessment: Other exceptions Cervical / Trunk Exceptions: s/p back surgery   Communication Communication Communication: No difficulties   Cognition Arousal/Alertness: Awake/alert Behavior During Therapy: WFL for tasks assessed/performed Overall Cognitive Status: Within Functional Limits for tasks assessed                                     General Comments  limited by pain    Exercises     Shoulder Instructions      Home Living Family/patient expects to be discharged to:: Private residence Living Arrangements: Parent Available Help at Discharge: Family Type of Home: Apartment Home Access: Stairs to enter Secretary/administrator of Steps: 4 Entrance Stairs-Rails: Right Home Layout: One level     Bathroom Shower/Tub: Chief Strategy Officer: Standard     Home Equipment: Tub bench;Hand held shower head;Toilet riser;Other (comment);Walker - 2 wheels;Cane - single point;Crutches (bed rail)   Additional Comments: mother is disabled, pt assists mother      Prior Functioning/Environment Level of Independence: Independent        Comments: 1-2 days prior to admission using RW due to pain, but normally independent with mobility, ADLs, IADLs (not driving)        OT Problem List: Decreased strength;Decreased activity tolerance;Impaired balance (sitting and/or standing);Pain;Decreased knowledge of precautions;Decreased knowledge of use of DME or AE;Obesity      OT Treatment/Interventions: Self-care/ADL  training;Therapeutic exercise;DME and/or AE instruction;Therapeutic activities;Balance training;Patient/family education    OT Goals(Current goals can be found in the care plan section) Acute Rehab OT Goals Patient Stated Goal: decrease pain OT Goal Formulation: With patient Time For Goal Achievement: 10/30/20 Potential to Achieve Goals: Good  OT Frequency: Min 2X/week   Barriers to D/C:            Co-evaluation PT/OT/SLP Co-Evaluation/Treatment: Yes Reason for Co-Treatment: For patient/therapist safety;To address functional/ADL transfers;Other (comment) (pain) PT goals addressed during session: Mobility/safety with mobility;Balance;Proper use of DME OT goals addressed during session: ADL's and self-care      AM-PAC OT "6 Clicks" Daily Activity     Outcome Measure Help from another person eating meals?: None Help from another person taking care of personal grooming?: A Little Help from another person toileting, which includes using toliet, bedpan, or urinal?: A Lot Help from another person bathing (including washing, rinsing, drying)?: A Lot Help from another person to put on and taking off regular upper body clothing?: A Little Help from another person to put on and taking off regular lower body clothing?: A Lot 6 Click Score: 16   End of Session Equipment Utilized During Treatment: Rolling walker Nurse Communication: Mobility status  Activity Tolerance: Patient tolerated treatment well Patient left: in chair;with call bell/phone within reach;with chair alarm set  OT Visit Diagnosis: Other abnormalities of gait and mobility (R26.89);Muscle weakness (generalized) (M62.81);Pain Pain - part of body:  (back)  Time: 7253-6644 OT Time Calculation (min): 38 min Charges:  OT General Charges $OT Visit: 1 Visit OT Evaluation $OT Eval Moderate Complexity: 1 Mod  Barry Brunner, OT Acute Rehabilitation Services Pager [phone] Office [phone]   Chancy Milroy 10/16/2020, 1:55 PM

## 2020-10-16 NOTE — Evaluation (Signed)
Physical Therapy Evaluation Patient Details Name: Derek Woods MRN: [id number] DOB: 1990/12/29 Today's Date: 10/16/2020   History of Present Illness  [name] 30 yo male who presented on 10/07/20 with shortness of breath and found to have large R empyema.  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  Medical hx includes daily heroin IVDA (previous suboxone use), asthma, morbid obesity, OSA, and smokes.  Clinical Impression  Pt admitted with above diagnosis. Despite pain, pt was able to transfer and ambulate 6'x2 in his room.  He did require mod A for transfer and min A to ambulate, with 2 therapist present for safety.  Pt educated and cued on back precautions. Pt was motivated to move but was hoping to have more pain meds prior to mobility (Tordol had been discontinued).  Normally, pt is independent and helps care for his disabled mother at home. Pt currently with functional limitations due to the deficits listed below (see PT Problem List). Pt will benefit from skilled PT to increase their independence and safety with mobility to allow discharge to the venue listed below.       Follow Up Recommendations SNF    Equipment Recommendations  Rolling walker with 5" wheels;3in1 (PT) (pt reports possible RW vs Standard at home - if standard then needs RW)    Recommendations for Other Services       Precautions / Restrictions Precautions Precautions: Back Precaution Booklet Issued: No Precaution Comments: reviewed precautions with pt Required Braces or Orthoses:  (no brace needed order) Restrictions Weight Bearing Restrictions: No      Mobility  Bed Mobility Overal bed mobility: Needs Assistance Bed Mobility: Rolling;Sidelying to Sit Rolling: Mod assist;+2 for safety/equipment Sidelying to sit: Mod  assist;+2 for safety/equipment       General bed mobility comments: Mod A to roll and cues for log roll; increased time to lift trunk with cues for use of UE and assist to guide legs off bed    Transfers Overall transfer level: Needs assistance Equipment used: Rolling walker (2 wheeled) Transfers: Sit to/from Stand Sit to Stand: Min assist;+2 safety/equipment;From elevated surface         General transfer comment: Performed x 3 with cues for safe hand placement  Ambulation/Gait Ambulation/Gait assistance: Min assist;+2 safety/equipment Gait Distance (Feet): 6 Feet (6'x2) Assistive device: Rolling walker (2 wheeled) Gait Pattern/deviations: Step-to pattern;Trunk flexed;Decreased stride length Gait velocity: decreased   General Gait Details: Ambulated to sink and back with seated rest break.  Limited by pain  Stairs            Wheelchair Mobility    Modified Rankin (Stroke Patients Only)       Balance Overall balance assessment: Needs assistance Sitting-balance support: Bilateral upper extremity supported Sitting balance-Leahy Scale: Fair Sitting balance - Comments: Requiring UE support more for pain control   Standing balance support: Bilateral upper extremity supported;No upper extremity supported Standing balance-Leahy Scale: Poor Standing balance comment: Requiring UE support or leaning on counter when washing hands                             Pertinent Vitals/Pain Pain Assessment: 0-10 Pain Score:  (5/10 reported at rest; pt with increased symptoms with activity) Pain Location: back Pain Descriptors / Indicators: Discomfort;Operative site guarding;Shooting  Pain Intervention(s): Limited activity within patient's tolerance;Monitored during session;Repositioned    Home Living Family/patient expects to be discharged to:: Private residence Living Arrangements: Parent Available Help at Discharge: Family Type of Home: Apartment Home Access:  Stairs to enter Entrance Stairs-Rails: Right Entrance Stairs-Number of Steps: 4 Home Layout: One level Home Equipment: Tub bench;Hand held shower head;Toilet riser;Other (comment);Walker - 2 wheels;Cane - single point;Crutches (bed rail) Additional Comments: mother is disabled, pt assists mother    Prior Function Level of Independence: Independent         Comments: 1-2 days prior to admission using RW due to pain, but normally independent with mobility, ADLs, IADLs (not driving)     Hand Dominance   Dominant Hand: Right    Extremity/Trunk Assessment   Upper Extremity Assessment Upper Extremity Assessment: Defer to OT evaluation    Lower Extremity Assessment Lower Extremity Assessment: Generalized weakness (ROM WFL, MMT at least 3/5 but not further tested due to back sx/pain)    Cervical / Trunk Assessment Cervical / Trunk Assessment: Normal (painful when stands straight)  Communication   Communication: No difficulties  Cognition Arousal/Alertness: Awake/alert Behavior During Therapy: WFL for tasks assessed/performed Overall Cognitive Status: Within Functional Limits for tasks assessed                                        General Comments      Exercises     Assessment/Plan    PT Assessment Patient needs continued PT services  PT Problem List Decreased strength;Decreased mobility;Decreased safety awareness;Decreased range of motion;Decreased knowledge of precautions;Decreased activity tolerance;Decreased balance;Cardiopulmonary status limiting activity;Decreased knowledge of use of DME;Pain       PT Treatment Interventions DME instruction;Therapeutic activities;Gait training;Therapeutic exercise;Patient/family education;Balance training;Functional mobility training;Modalities    PT Goals (Current goals can be found in the Care Plan section)  Acute Rehab PT Goals Patient Stated Goal: decrease pain PT Goal Formulation: With patient Time For  Goal Achievement: 10/30/20 Potential to Achieve Goals: Good    Frequency Min 5X/week   Barriers to discharge Decreased caregiver support      Co-evaluation PT/OT/SLP Co-Evaluation/Treatment: Yes Reason for Co-Treatment: For patient/therapist safety;Complexity of the patient's impairments (multi-system involvement) PT goals addressed during session: Mobility/safety with mobility;Balance;Proper use of DME OT goals addressed during session: ADL's and self-care       AM-PAC PT "6 Clicks" Mobility  Outcome Measure Help needed turning from your back to your side while in a flat bed without using bedrails?: A Lot Help needed moving from lying on your back to sitting on the side of a flat bed without using bedrails?: A Lot Help needed moving to and from a bed to a chair (including a wheelchair)?: A Little Help needed standing up from a chair using your arms (e.g., wheelchair or bedside chair)?: A Little Help needed to walk in hospital room?: A Little Help needed climbing 3-5 steps with a railing? : A Lot 6 Click Score: 15    End of Session Equipment Utilized During Treatment: Other (comment) (Assist of 2 - unable to use gait belt due to surgical sites and obesity) Activity Tolerance: Patient limited by pain Patient left: with chair alarm set;in chair;with call bell/phone within reach (pillows to position) Nurse Communication: Mobility status PT Visit Diagnosis: Other abnormalities of gait and mobility (R26.89);Muscle weakness (generalized) (M62.81);Pain Pain - part of body:  (Back)    Time: 2440-1027 PT  Time Calculation (min) (ACUTE ONLY): 39 min   Charges:   PT Evaluation $PT Eval Moderate Complexity: 1 Mod PT Treatments $Therapeutic Activity: 8-22 mins        Anise Salvo, PT Acute Rehab Services Pager [phone] [facility] [phone]    Rayetta Humphrey 10/16/2020, 1:45 PM

## 2020-10-16 NOTE — Progress Notes (Signed)
Subjective: Patient reports Feels much better than preop  Objective: Vital signs in last 24 hours: Temp:  [97.8 F (36.6 C)-99.3 F (37.4 C)] 98.3 F (36.8 C) (05/03 0355) Pulse Rate:  [85-104] 92 (05/03 0355) Resp:  [16-29] 18 (05/03 0355) BP: (124-169)/(73-94) 124/73 (05/03 0355) SpO2:  [92 %-99 %] 93 % (05/03 0355)  Intake/Output from previous day: 05/02 0701 - 05/03 0700 In: 1146 [P.O.:120; I.V.:826] Out: 1545 [Urine:1400; Drains:95; Blood:50] Intake/Output this shift: No intake/output data recorded.  Doing well strength 5-5 incision clean dry and intact Hemovac put out 70 cc still little bit much to DC.  Lab Results: Recent Labs    10/15/20 0353 10/16/20 0422  WBC 6.1 8.6  HGB 9.3* 9.5*  HCT 29.1* 29.5*  PLT 269 322   BMET Recent Labs    10/15/20 0353  NA 136  K 4.7  CL 100  CO2 30  GLUCOSE 109*  BUN 18  CREATININE 0.82  CALCIUM 8.7*    Studies/Results: DG THORACOLUMABAR SPINE  Result Date: 10/15/2020 CLINICAL DATA:  Elective surgery. Additional history provided: T11-12 laminectomy. Provided fluoroscopy time 5 seconds (5.32 mGy). EXAM: THORACOLUMBAR SPINE 1V COMPARISON:  Thoracic spine MRI 10/14/2020. CT chest/abdomen/pelvis 10/06/2020. FINDINGS: Two AP view intraoperative fluoroscopic images of the lower thoracic spine are submitted. On the initial provided image taken at 2:04 p.m., a metallic surgical instrument projects over the lower thoracic spine at the T12 level. On the subsequent image provided at 2:31 p.m., there is sequela of interval T11-T12 laminectomy. Curvilinear hyperdensity projects over the laminectomy bed, likely reflecting a surgical sponge/packing material. A metallic surgical instrument also projects over the thoracic spine at this level. Overlying retractors. IMPRESSION: Two intraoperative fluoroscopic images of the thoracic spine from reported T11-T12 laminectomy, as described. On the most recent provided image, curvilinear hyperdensity  projects over the T11-T12 laminectomy bed, likely reflecting a surgical sponge/packing material. Correlate with the operative history. Electronically Signed   By: Jackey Loge DO   On: 10/15/2020 16:38   DG CHEST PORT 1 VIEW  Result Date: 10/14/2020 CLINICAL DATA:  Post chest tube removal EXAM: PORTABLE CHEST 1 VIEW COMPARISON:  Oct 14, 2020 FINDINGS: Infiltrate in the right base is stable. The right chest tube is been removed. No pneumothorax. The cardiomediastinal silhouette is stable. The right PICC line is stable, in good position. The left lung is clear. IMPRESSION: 1. No pneumothorax after right chest tube removal. 2. Continued opacity/infiltrate in the right base. Electronically Signed   By: Gerome Sam III M.D   On: 10/14/2020 16:08   DG C-Arm 1-60 Min  Result Date: 10/15/2020 CLINICAL DATA:  Elective surgery. Additional history provided: T11-12 laminectomy. Provided fluoroscopy time 5 seconds (5.32 mGy). EXAM: THORACOLUMBAR SPINE 1V COMPARISON:  Thoracic spine MRI 10/14/2020. CT chest/abdomen/pelvis 10/06/2020. FINDINGS: Two AP view intraoperative fluoroscopic images of the lower thoracic spine are submitted. On the initial provided image taken at 2:04 p.m., a metallic surgical instrument projects over the lower thoracic spine at the T12 level. On the subsequent image provided at 2:31 p.m., there is sequela of interval T11-T12 laminectomy. Curvilinear hyperdensity projects over the laminectomy bed, likely reflecting a surgical sponge/packing material. A metallic surgical instrument also projects over the thoracic spine at this level. Overlying retractors. IMPRESSION: Two intraoperative fluoroscopic images of the thoracic spine from reported T11-T12 laminectomy, as described. On the most recent provided image, curvilinear hyperdensity projects over the T11-T12 laminectomy bed, likely reflecting a surgical sponge/packing material. Correlate with the operative history. Electronically Signed  By: Jackey Loge DO   On: 10/15/2020 16:38    Assessment/Plan: Postop day 1 decompressive thoracic Lam doing fairly well mobilize today with physical and Occupational Therapy continue Hemovac for now can DC when it puts out less than 30 cc over an 8-hour shift.  LOS: 9 days     Mariam Dollar 10/16/2020, 7:38 AM

## 2020-10-16 NOTE — Progress Notes (Signed)
PROGRESS NOTE    Gid Schoffstall  NWG:[id number] DOB: Nov 11, 1990 DOA: 10/07/2020 PCP: Pcp, No   Brief Narrative:  Derek Woods is a 30 y.o. M, with a pertinent pmx of daily heroin IVDA (previous suboxone use), asthma, morbid obesity (BMI 52), 1 PPD smoking, possible OSA, presented to [facility] on 4/24 with complaints of back pain, right flank pain, and one week with shortness of breath . CT of his chest revealed a large right empyema. He was admitted to [facility] by the hospitalist team.  He was taken to the OR on 4/26 with Dr. Laneta Simmers for a VATS and drainage of right empyema, however a right thoracotomy had to be prefomed. He was a difficult intubation for the procedure and he was unable to be weaned from the ventilator post operatively. There was reported difficulty with oxygenation during the procedure. PCCM was consulted for ventilator management who assumed care as primary team.  Patient was extubated on 10/10/2020 and was transferred under [facility] care on 10/11/2020.  Posterior chest tube was removed on 10/12/2020.  Thoracotomy culture growing MRSA.  Antibiotics de-escalated to vancomycin.  ID consulted on 10/13/2020.  MRI thoracic spine was done which showed T11/T12 discitis and possible abscess. Status post decompressive thoracic laminectomy T11-12 by neurosurgery on 10/15/2020.  Has a Hemovac.  Lumbar MRI is still pending.  Hep C antibodies positive.  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  S/p thoracotomy as well as chest tube, one of them was removed  few days ago and the other 1 removed on 10/14/2020. Cultures growing MRSA.  He is on vancomycin which we will continue.  Seen by ID.  Cardiothoracic surgery signed off.  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  Has a Hemovac.  Lumbar MRI is still pending.  Management per neurosurgery.  Requesting pain medications, will start him on Toradol.  Anxiety: Did not complain of any anxiety to me however later on requested some medications to help with anxiety.  Xanax as needed.  Hypotension: Blood pressure was elevated, amlodipine was started.  Blood pressure much better now.  Continue that and as needed hydralazine.  Polysubstance abuse: Continue Suboxone at current dose.  Normocytic anemia: Hemoglobin is stable over 9.  Watch daily.  Tobacco dependence: Smoking cessation counseling provided.  Positive hepatitis C antibody: No known previous history of hepatitis C infection.  Antibodies positive.  Will defer to ID for management on that.  DVT prophylaxis: SCD's Start: 10/15/20 1744 SCD's Start: 10/09/20 1716   Code Status: Full Code  Family Communication: None present at bedside.  Plan of care discussed with patient in length and he verbalized understanding and agreed with it.  Status is: Inpatient  Remains inpatient appropriate because:Inpatient level of care appropriate due to severity of illness   Dispo: The patient is from: Home              Anticipated d/c is to: Home              Patient currently is not medically stable to d/c.   Difficult to  place patient No        Estimated body mass index is 52.2 kg/m as calculated from the following:   Height as of this encounter: 5\' 6"  (1.676 m).   Weight as of this encounter: 146.7 kg.      Nutritional status:               Consultants:   CT surgery  ID  Neurosurgery  Procedures:   Chest tube placement, thoracotomy and  intubation  Antimicrobials:  Anti-infectives (From admission, onward)   Start     Dose/Rate Route Frequency Ordered Stop   10/15/20 1830  ceFAZolin (ANCEF) IVPB 2g/100 mL premix        2 g 200 mL/hr over 30 Minutes Intravenous Every 8 hours 10/15/20 1744 10/16/20 0659   10/13/20 1200  vancomycin (VANCOREADY) IVPB 2000 mg/400 mL        2,000 mg 200 mL/hr over 120 Minutes Intravenous Every 24 hours 10/12/20 1115     10/12/20 1215  vancomycin (VANCOREADY) IVPB 500 mg/100 mL        500 mg 100 mL/hr over 60 Minutes Intravenous  Once 10/12/20 1115 10/12/20 1329   10/12/20 0600  vancomycin (VANCOREADY) IVPB 1500 mg/300 mL  Status:  Discontinued        1,500 mg 150 mL/hr over 120 Minutes Intravenous Every 24 hours 10/12/20 0541 10/12/20 0556   10/12/20 0600  vancomycin (VANCOREADY) IVPB 1500 mg/300 mL  Status:  Discontinued        1,500 mg 150 mL/hr over 120 Minutes Intravenous Every 18 hours 10/12/20 0556 10/12/20 1115   10/11/20 1343  vancomycin variable dose per unstable renal function (pharmacist dosing)  Status:  Discontinued         Does not apply See admin instructions 10/11/20 1344 10/12/20 0556   10/10/20 0400  vancomycin (VANCOREADY) IVPB 1250 mg/250 mL  Status:  Discontinued        1,250 mg 166.7 mL/hr over 90 Minutes Intravenous Every 8 hours 10/09/20 1835 10/11/20 1344   10/09/20 2000  vancomycin (VANCOREADY) IVPB 1250 mg/250 mL  Status:  Discontinued        1,250 mg 166.7 mL/hr over 90 Minutes Intravenous Every 8 hours 10/09/20 1117 10/09/20 1835   10/09/20 1900  vancomycin (VANCOCIN) 2,500 mg in sodium chloride 0.9 % 500 mL IVPB        2,500 mg 250 mL/hr over 120 Minutes Intravenous  Once 10/09/20 1835 10/09/20 2137   10/09/20 1200  vancomycin (VANCOCIN) 2,500 mg in sodium chloride 0.9 % 500 mL IVPB  Status:  Discontinued        2,500 mg 250 mL/hr over 120 Minutes Intravenous  Once 10/09/20 1113 10/09/20 1835   10/07/20 2200  vancomycin (VANCOREADY) IVPB 2000 mg/400 mL   Status:  Discontinued        2,000 mg 200 mL/hr over 120 Minutes Intravenous Every 12 hours 10/07/20 1251 10/07/20 1437   10/07/20 2000  ceFEPIme (MAXIPIME) 2 g in sodium chloride 0.9 % 100 mL IVPB  Status:  Discontinued        2 g 200 mL/hr over 30 Minutes Intravenous Every 8 hours 10/07/20 1251 10/11/20 1342   10/07/20 1145  ceFEPIme (MAXIPIME) 2 g in sodium chloride 0.9 % 100 mL IVPB        2 g 200 mL/hr over 30 Minutes Intravenous  Once 10/07/20 1048 10/07/20 1351   10/07/20 1100  vancomycin (VANCOCIN) 2,500 mg in sodium chloride 0.9 % 500 mL IVPB  Status:  Discontinued        2,500 mg 250 mL/hr over 120 Minutes Intravenous  Once 10/07/20 1048 10/07/20 1437         Subjective: Seen and examined.  Does not feel well.  Did not sleep well.  Feels tired.  Worried about the surgery as well.  No other complaint.  No shortness of breath.  Objective: Vitals:   10/15/20 2338 10/16/20 0355 10/16/20 0737 10/16/20 1124  BP: (!) 166/91 124/73 135/75 130/83  Pulse: 95 92 82 89  Resp: 18 18 20 19   Temp: 97.9 F (36.6 C) 98.3 F (36.8 C) 98.2 F (36.8 C) 98.2 F (36.8 C)  TempSrc: Oral Oral Oral Oral  SpO2: 92% 93% 95% 95%  Weight:      Height:        Intake/Output Summary (Last 24 hours) at 10/16/2020 1320 Last data filed at 10/16/2020 0545 Gross per 24 hour  Intake 1146 ml  Output 1545 ml  Net -399 ml   Filed Weights   10/07/20 1006  Weight: (!) 146.7 kg    Examination:  General exam: Appears calm and comfortable, morbidly obese Respiratory system: Clear to auscultation. Respiratory effort normal. Cardiovascular system: S1 & S2 heard, RRR. No JVD, murmurs, rubs, gallops or clicks. No pedal edema. Gastrointestinal system: Abdomen is nondistended, soft and nontender. No organomegaly or masses felt. Normal bowel sounds heard. Central nervous system: Alert and oriented. No focal neurological deficits. Extremities: Symmetric 5 x 5 power.  Has Hemovac in the thoracic  vertebrae. Skin: No rashes, lesions or ulcers.  Psychiatry: Judgement and insight appear normal. Mood & affect appropriate.    Data Reviewed: I have personally reviewed following labs and imaging studies  CBC: Recent Labs  Lab 10/12/20 0435 10/13/20 0339 10/14/20 0331 10/15/20 0353 10/16/20 0422  WBC 6.0 6.2 7.2 6.1 8.6  NEUTROABS 4.0 3.7 4.8 3.5 6.9  HGB 9.4* 9.1* 9.7* 9.3* 9.5*  HCT 29.6* 28.2* 30.7* 29.1* 29.5*  MCV 94.6 93.4 94.5 93.9 92.5  PLT 299 262 312 269 322   Basic Metabolic Panel: Recent Labs  Lab 10/10/20 0422 10/10/20 0458 10/11/20 0435 10/12/20 0435 10/13/20 0339 10/15/20 0353  NA 134* 138 134* 133* 134* 136  K 5.1 4.6 4.2 4.0 4.3 4.7  CL 101  --  98 100 100 100  CO2 28  --  29 28 28 30   GLUCOSE 279*  --  110* 120* 123* 109*  BUN 14  --  19 16 19 18   CREATININE 0.81  --  0.90 0.81 0.76 0.82  CALCIUM 9.0  --  8.8* 8.6* 8.7* 8.7*  MG 1.9  --   --   --   --   --    GFR: Estimated Creatinine Clearance: 179.1 mL/min (by C-G formula based on SCr of 0.82 mg/dL). Liver Function Tests: Recent Labs  Lab 10/11/20 0435 10/12/20 0435 10/13/20 0339  AST 25 27 32  ALT 15 17 20   ALKPHOS 92 103 107  BILITOT 0.5 0.5 0.6  PROT 6.6 6.7 6.4*  ALBUMIN 2.2* 2.2* 2.1*   No results for input(s): LIPASE, AMYLASE in the last 168 hours. No results for input(s): AMMONIA in the last 168 hours. Coagulation Profile: Recent Labs  Lab 10/14/20 0901  INR 1.0   Cardiac Enzymes: No results for input(s): CKTOTAL, CKMB, CKMBINDEX, TROPONINI in the last 168 hours. BNP (last 3 results) No results for input(s): PROBNP in the last 8760 hours. HbA1C: No results for input(s): HGBA1C in  the last 72 hours. CBG: Recent Labs  Lab 10/10/20 0455 10/10/20 0817 10/10/20 1140 10/11/20 1128 10/11/20 1559  GLUCAP 176* 115* 121* 140* 128*   Lipid Profile: No results for input(s): CHOL, HDL, LDLCALC, TRIG, CHOLHDL, LDLDIRECT in the last 72 hours. Thyroid Function Tests: No  results for input(s): TSH, T4TOTAL, FREET4, T3FREE, THYROIDAB in the last 72 hours. Anemia Panel: No results for input(s): VITAMINB12, FOLATE, FERRITIN, TIBC, IRON, RETICCTPCT in the last 72 hours. Sepsis Labs: No results for input(s): PROCALCITON, LATICACIDVEN in the last 168 hours.  Recent Results (from the past 240 hour(s))  Culture, blood (routine x 2)     Status: None   Collection Time: 10/07/20 11:01 AM   Specimen: BLOOD LEFT HAND  Result Value Ref Range Status   Specimen Description BLOOD LEFT HAND  Final   Special Requests   Final    BOTTLES DRAWN AEROBIC ONLY Blood Culture results may not be optimal due to an inadequate volume of blood received in culture bottles   Culture   Final    NO GROWTH 5 DAYS Performed at [facility], [address] [place], [place] [address]    Report Status 10/12/2020 FINAL  Final  Culture, blood (routine x 2)     Status: None   Collection Time: 10/07/20 11:14 AM   Specimen: BLOOD RIGHT HAND  Result Value Ref Range Status   Specimen Description BLOOD RIGHT HAND  Final   Special Requests   Final    BOTTLES DRAWN AEROBIC ONLY Blood Culture results may not be optimal due to an inadequate volume of blood received in culture bottles   Culture   Final    NO GROWTH 5 DAYS Performed at [facility], [address], [place], [place] [address]    Report Status 10/12/2020 FINAL  Final  Surgical PCR screen     Status: Abnormal   Collection Time: 10/08/20  8:58 PM   Specimen: Nasal Mucosa; Nasal Swab  Result Value Ref Range Status   MRSA, PCR POSITIVE (A) NEGATIVE Final    Comment: RESULT CALLED TO, READ BACK BY AND VERIFIED WITH: O.P. RN 10/09/20 0015 JDW    Staphylococcus aureus POSITIVE (A) NEGATIVE Final    Comment: (NOTE) The Xpert SA Assay (FDA approved for NASAL specimens in patients 52 years of age and older), is one component of a comprehensive surveillance program. It is not intended to diagnose infection nor to guide or  monitor treatment. Performed at [facility], [address], [place], [place] [address]   Aerobic/Anaerobic Culture w Gram Stain (surgical/deep wound)     Status: None   Collection Time: 10/09/20  3:46 PM   Specimen: PATH Cytology Pleural fluid; Body Fluid  Result Value Ref Range Status   Specimen Description FLUID PLEURAL RIGHT  Final   Special Requests NONE  Final   Gram Stain   Final    RARE WBC PRESENT,BOTH PMN AND MONONUCLEAR FEW GRAM POSITIVE COCCI IN PAIRS IN CLUSTERS    Culture   Final    ABUNDANT METHICILLIN RESISTANT STAPHYLOCOCCUS AUREUS NO ANAEROBES ISOLATED Performed at [facility], [address], [place], [place] [address]    Report Status 10/15/2020 FINAL  Final   Organism ID, Bacteria METHICILLIN RESISTANT STAPHYLOCOCCUS AUREUS  Final      Susceptibility   Methicillin resistant staphylococcus aureus - MIC*    CIPROFLOXACIN >=8 RESISTANT Resistant     ERYTHROMYCIN >=8 RESISTANT Resistant  GENTAMICIN <=0.5 SENSITIVE Sensitive     OXACILLIN >=4 RESISTANT Resistant     TETRACYCLINE >=16 RESISTANT Resistant     VANCOMYCIN <=0.5 SENSITIVE Sensitive     TRIMETH/SULFA <=10 SENSITIVE Sensitive     CLINDAMYCIN <=0.25 SENSITIVE Sensitive     RIFAMPIN <=0.5 SENSITIVE Sensitive     Inducible Clindamycin NEGATIVE Sensitive     * ABUNDANT METHICILLIN RESISTANT STAPHYLOCOCCUS AUREUS  Acid Fast Smear (AFB)     Status: None   Collection Time: 10/09/20  3:46 PM   Specimen: PATH Cytology Pleural fluid; Body Fluid  Result Value Ref Range Status   AFB Specimen Processing Concentration  Final   Acid Fast Smear Negative  Final    Comment: (NOTE) Performed At: [facility] [address] [place], [place] [id number] Jolene Schimke MD WU:[id number]    Source (AFB) FLUID  Final    Comment: PLEURAL RIGHT Performed at [facility], [address] [place], [place] [address]   Aerobic/Anaerobic Culture w Gram Stain (surgical/deep wound)     Status:  None   Collection Time: 10/09/20  3:52 PM   Specimen: Soft Tissue, Other  Result Value Ref Range Status   Specimen Description TISSUE  Final   Special Requests PLEURAL PEEL  Final   Gram Stain   Final    RARE WBC PRESENT,BOTH PMN AND MONONUCLEAR RARE GRAM POSITIVE COCCI IN CLUSTERS    Culture   Final    MODERATE METHICILLIN RESISTANT STAPHYLOCOCCUS AUREUS NO ANAEROBES ISOLATED Performed at [facility], [address], [place], [place] [address]    Report Status 10/15/2020 FINAL  Final   Organism ID, Bacteria METHICILLIN RESISTANT STAPHYLOCOCCUS AUREUS  Final      Susceptibility   Methicillin resistant staphylococcus aureus - MIC*    CIPROFLOXACIN >=8 RESISTANT Resistant     ERYTHROMYCIN >=8 RESISTANT Resistant     GENTAMICIN <=0.5 SENSITIVE Sensitive     OXACILLIN >=4 RESISTANT Resistant     TETRACYCLINE >=16 RESISTANT Resistant     VANCOMYCIN <=0.5 SENSITIVE Sensitive     TRIMETH/SULFA <=10 SENSITIVE Sensitive     CLINDAMYCIN <=0.25 SENSITIVE Sensitive     RIFAMPIN <=0.5 SENSITIVE Sensitive     Inducible Clindamycin NEGATIVE Sensitive     * MODERATE METHICILLIN RESISTANT STAPHYLOCOCCUS AUREUS  Acid Fast Smear (AFB)     Status: None   Collection Time: 10/09/20  3:52 PM   Specimen: Soft Tissue, Other  Result Value Ref Range Status   AFB Specimen Processing Comment  Final    Comment: Tissue Grinding and Digestion/Decontamination   Acid Fast Smear Negative  Final    Comment: (NOTE) Performed At: [facility] [address] [place], [place] [id number] Jolene Schimke MD MW:[id number]    Source (AFB) TISSUE  Final    Comment: PLEURAL PEEL Performed at [facility], [address] [place], [place] [address]   Aerobic/Anaerobic Culture w Gram Stain (surgical/deep wound)     Status: None (Preliminary result)   Collection Time: 10/15/20  3:01 PM   Specimen: PATH Other; Tissue  Result Value Ref Range Status   Specimen Description ABSCESS  Final   Special  Requests T11 12 EPIDURAL PATIENT ON FOLLOWING VANC  Final   Gram Stain NO WBC SEEN NO ORGANISMS SEEN   Final   Culture   Final    RARE STAPHYLOCOCCUS AUREUS SUSCEPTIBILITIES TO FOLLOW Performed at [facility], [address] [place], [place] [address]    Report Status  PENDING  Incomplete      Radiology Studies: DG THORACOLUMABAR SPINE  Result Date: 10/15/2020 CLINICAL DATA:  Elective surgery. Additional history provided: T11-12 laminectomy. Provided fluoroscopy time 5 seconds (5.32 mGy). EXAM: THORACOLUMBAR SPINE 1V COMPARISON:  Thoracic spine MRI 10/14/2020. CT chest/abdomen/pelvis 10/06/2020. FINDINGS: Two AP view intraoperative fluoroscopic images of the lower thoracic spine are submitted. On the initial provided image taken at 2:04 p.m., a metallic surgical instrument projects over the lower thoracic spine at the T12 level. On the subsequent image provided at 2:31 p.m., there is sequela of interval T11-T12 laminectomy. Curvilinear hyperdensity projects over the laminectomy bed, likely reflecting a surgical sponge/packing material. A metallic surgical instrument also projects over the thoracic spine at this level. Overlying retractors. IMPRESSION: Two intraoperative fluoroscopic images of the thoracic spine from reported T11-T12 laminectomy, as described. On the most recent provided image, curvilinear hyperdensity projects over the T11-T12 laminectomy bed, likely reflecting a surgical sponge/packing material. Correlate with the operative history. Electronically Signed   By: Jackey Loge DO   On: 10/15/2020 16:38   DG CHEST PORT 1 VIEW  Result Date: 10/14/2020 CLINICAL DATA:  Post chest tube removal EXAM: PORTABLE CHEST 1 VIEW COMPARISON:  Oct 14, 2020 FINDINGS: Infiltrate in the right base is stable. The right chest tube is been removed. No pneumothorax. The cardiomediastinal silhouette is stable. The right PICC line is stable, in good position. The left lung is clear. IMPRESSION: 1. No  pneumothorax after right chest tube removal. 2. Continued opacity/infiltrate in the right base. Electronically Signed   By: Gerome Sam III M.D   On: 10/14/2020 16:08   DG C-Arm 1-60 Min  Result Date: 10/15/2020 CLINICAL DATA:  Elective surgery. Additional history provided: T11-12 laminectomy. Provided fluoroscopy time 5 seconds (5.32 mGy). EXAM: THORACOLUMBAR SPINE 1V COMPARISON:  Thoracic spine MRI 10/14/2020. CT chest/abdomen/pelvis 10/06/2020. FINDINGS: Two AP view intraoperative fluoroscopic images of the lower thoracic spine are submitted. On the initial provided image taken at 2:04 p.m., a metallic surgical instrument projects over the lower thoracic spine at the T12 level. On the subsequent image provided at 2:31 p.m., there is sequela of interval T11-T12 laminectomy. Curvilinear hyperdensity projects over the laminectomy bed, likely reflecting a surgical sponge/packing material. A metallic surgical instrument also projects over the thoracic spine at this level. Overlying retractors. IMPRESSION: Two intraoperative fluoroscopic images of the thoracic spine from reported T11-T12 laminectomy, as described. On the most recent provided image, curvilinear hyperdensity projects over the T11-T12 laminectomy bed, likely reflecting a surgical sponge/packing material. Correlate with the operative history. Electronically Signed   By: Jackey Loge DO   On: 10/15/2020 16:38    Scheduled Meds: . amLODipine  5 mg Oral Daily  . buprenorphine-naloxone  1 tablet Sublingual BID  . Chlorhexidine Gluconate Cloth  6 each Topical Daily  . docusate sodium  100 mg Oral BID  . mouth rinse  15 mL Mouth Rinse BID  . nicotine  14 mg Transdermal Daily  . pantoprazole (PROTONIX) IV  40 mg Intravenous QHS  . polyethylene glycol  17 g Oral Daily  . senna-docusate  2 tablet Oral QHS  . sodium chloride flush  10-40 mL Intracatheter Q12H  . sodium chloride flush  3 mL Intravenous Q12H  . sodium chloride flush  3 mL  Intravenous Q12H   Continuous Infusions: . sodium chloride 250 mL (10/15/20 2348)  . vancomycin 2,000 mg (10/16/20 1210)     LOS: 9 days   Time spent: 30 minutes   Daleen Bo  Jazleen Robeck, MD [facility]  10/16/2020, 1:20 PM   To contact the attending provider between 7A-7P or the covering provider during after hours 7P-7A, please log into the web site [email or web address].

## 2020-10-16 NOTE — Progress Notes (Signed)
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  Patient went to OR with neurosurgery yesterday   Assessment MRSA pneumonia/empyema s/p right VATS/thoracotomy 4/26, or cultures with MRSA  T11-T12 discitis and osteomyelitis, epidural abscess s/p thoracic laminectomy  IVDU Smoking HCV ab positive    Recommendations Continue vancomycin for now, pharmacy to dose Follow-up MRI lumbar spine, STILL PENDING  Follow-up neurosurgery recommendation, OR note Monitor CBC BMP and Vanco trough Follow-up hepatitis C RNA  Rest of the management as per the primary team. Thank you for the consult. Please page with pertinent questions or concerns.  ___________________________________________________________________ Subjective patient seen and examined at the bedside. Has some soreness in the back post surgery. Denies nausea/vomiting/abdominal pain and diarrhea   Vitals  BP 135/75 (BP Location: Left Arm)   Pulse 82   Temp 98.2 F (36.8 C) (Oral)   Resp 20   Ht 5\' 6"  (1.676 m)   Wt (!) 146.7 kg   SpO2 95%   BMI 52.20 kg/m   Physical Exam Constitutional:  Lying in bed, not in acute distress, on RA    Comments:   Cardiovascular:     Rate and  Rhythm: Normal rate and regular rhythm.     Heart sounds: No murmur heard.   Pulmonary:     Effort: Pulmonary effort is normal.     Comments:   Abdominal:     Palpations: Abdomen is soft.     Tenderness:   Musculoskeletal:        General: No swelling or tenderness.   Skin:    Comments: No obvious rashes or lesions   Neurological:     General: No focal deficit present.   Psychiatric:        Mood and Affect: Mood normal.     Pertinent Microbiology Results for orders placed or performed during the hospital encounter of 10/07/20  Culture, blood (routine x 2)     Status: None   Collection Time: 10/07/20 11:01 AM   Specimen: BLOOD LEFT HAND  Result Value Ref Range Status   Specimen Description BLOOD LEFT HAND  Final   Special Requests   Final    BOTTLES DRAWN AEROBIC ONLY Blood Culture results may not be optimal due to an inadequate volume of blood received in culture bottles   Culture   Final    NO GROWTH 5 DAYS Performed at [facility], [address] [place], [place] [id number]    Report Status 10/12/2020 FINAL  Final  Culture, blood (routine x 2)     Status: None   Collection Time: 10/07/20 11:14 AM   Specimen: BLOOD RIGHT HAND  Result Value Ref Range Status   Specimen Description BLOOD RIGHT HAND  Final   Special Requests   Final  BOTTLES DRAWN AEROBIC ONLY Blood Culture results may not be optimal due to an inadequate volume of blood received in culture bottles   Culture   Final    NO GROWTH 5 DAYS Performed at [facility], [address] [place], [place] [address]    Report Status 10/12/2020 FINAL  Final  Surgical PCR screen     Status: Abnormal   Collection Time: 10/08/20  8:58 PM   Specimen: Nasal Mucosa; Nasal Swab  Result Value Ref Range Status   MRSA, PCR POSITIVE (A) NEGATIVE Final    Comment: RESULT CALLED TO, READ BACK BY AND VERIFIED WITH: O.P. RN 10/09/20 0015 JDW    Staphylococcus aureus POSITIVE (A) NEGATIVE Final    Comment:  (NOTE) The Xpert SA Assay (FDA approved for NASAL specimens in patients 53 years of age and older), is one component of a comprehensive surveillance program. It is not intended to diagnose infection nor to guide or monitor treatment. Performed at [facility], [address] [place], [place] [address]   Aerobic/Anaerobic Culture w Gram Stain (surgical/deep wound)     Status: None   Collection Time: 10/09/20  3:46 PM   Specimen: PATH Cytology Pleural fluid; Body Fluid  Result Value Ref Range Status   Specimen Description FLUID PLEURAL RIGHT  Final   Special Requests NONE  Final   Gram Stain   Final    RARE WBC PRESENT,BOTH PMN AND MONONUCLEAR FEW GRAM POSITIVE COCCI IN PAIRS IN CLUSTERS    Culture   Final    ABUNDANT METHICILLIN RESISTANT STAPHYLOCOCCUS AUREUS NO ANAEROBES ISOLATED Performed at [facility], [address] [place], [place] [address]    Report Status 10/15/2020 FINAL  Final   Organism ID, Bacteria METHICILLIN RESISTANT STAPHYLOCOCCUS AUREUS  Final      Susceptibility   Methicillin resistant staphylococcus aureus - MIC*    CIPROFLOXACIN >=8 RESISTANT Resistant     ERYTHROMYCIN >=8 RESISTANT Resistant     GENTAMICIN <=0.5 SENSITIVE Sensitive     OXACILLIN >=4 RESISTANT Resistant     TETRACYCLINE >=16 RESISTANT Resistant     VANCOMYCIN <=0.5 SENSITIVE Sensitive     TRIMETH/SULFA <=10 SENSITIVE Sensitive     CLINDAMYCIN <=0.25 SENSITIVE Sensitive     RIFAMPIN <=0.5 SENSITIVE Sensitive     Inducible Clindamycin NEGATIVE Sensitive     * ABUNDANT METHICILLIN RESISTANT STAPHYLOCOCCUS AUREUS  Acid Fast Smear (AFB)     Status: None   Collection Time: 10/09/20  3:46 PM   Specimen: PATH Cytology Pleural fluid; Body Fluid  Result Value Ref Range Status   AFB Specimen Processing Concentration  Final   Acid Fast Smear Negative  Final    Comment: (NOTE) Performed At: [facility] [address] [place], [place] [id number] Jolene Schimke MD  UT:[id number]    Source (AFB) FLUID  Final    Comment: PLEURAL RIGHT Performed at [facility], [address] [place], [place] [address]   Aerobic/Anaerobic Culture w Gram Stain (surgical/deep wound)     Status: None   Collection Time: 10/09/20  3:52 PM   Specimen: Soft Tissue, Other  Result Value Ref Range Status   Specimen Description TISSUE  Final   Special Requests PLEURAL PEEL  Final   Gram Stain   Final    RARE WBC PRESENT,BOTH PMN AND MONONUCLEAR RARE GRAM POSITIVE COCCI IN CLUSTERS    Culture   Final    MODERATE METHICILLIN RESISTANT STAPHYLOCOCCUS AUREUS NO ANAEROBES ISOLATED  Performed at [facility], [address], [place], [place] [address]    Report Status 10/15/2020 FINAL  Final   Organism ID, Bacteria METHICILLIN RESISTANT STAPHYLOCOCCUS AUREUS  Final      Susceptibility   Methicillin resistant staphylococcus aureus - MIC*    CIPROFLOXACIN >=8 RESISTANT Resistant     ERYTHROMYCIN >=8 RESISTANT Resistant     GENTAMICIN <=0.5 SENSITIVE Sensitive     OXACILLIN >=4 RESISTANT Resistant     TETRACYCLINE >=16 RESISTANT Resistant     VANCOMYCIN <=0.5 SENSITIVE Sensitive     TRIMETH/SULFA <=10 SENSITIVE Sensitive     CLINDAMYCIN <=0.25 SENSITIVE Sensitive     RIFAMPIN <=0.5 SENSITIVE Sensitive     Inducible Clindamycin NEGATIVE Sensitive     * MODERATE METHICILLIN RESISTANT STAPHYLOCOCCUS AUREUS  Acid Fast Smear (AFB)     Status: None   Collection Time: 10/09/20  3:52 PM   Specimen: Soft Tissue, Other  Result Value Ref Range Status   AFB Specimen Processing Comment  Final    Comment: Tissue Grinding and Digestion/Decontamination   Acid Fast Smear Negative  Final    Comment: (NOTE) Performed At: [facility] [address] [place], [place] [id number] Jolene Schimke MD TZ:[id number]    Source (AFB) TISSUE  Final    Comment: PLEURAL PEEL Performed at [facility], [address] [place], [place] [address]   Aerobic/Anaerobic  Culture w Gram Stain (surgical/deep wound)     Status: None (Preliminary result)   Collection Time: 10/15/20  3:01 PM   Specimen: PATH Other; Tissue  Result Value Ref Range Status   Specimen Description ABSCESS  Final   Special Requests T11 12 EPIDURAL PATIENT ON FOLLOWING VANC  Final   Gram Stain   Final    NO WBC SEEN NO ORGANISMS SEEN Performed at [facility], [address], [place], [place] [address]    Culture PENDING  Incomplete   Report Status PENDING  Incomplete   Pertinent Lab. CBC Latest Ref Rng & Units 10/16/2020 10/15/2020 10/14/2020  WBC 4.0 - 10.5 K/uL 8.6 6.1 7.2  Hemoglobin 13.0 - 17.0 g/dL 5.9(D) 3.5(T) 0.1(X)  Hematocrit 39.0 - 52.0 % 29.5(L) 29.1(L) 30.7(L)  Platelets 150 - 400 K/uL 322 269 312   CMP Latest Ref Rng & Units 10/15/2020 10/13/2020 10/12/2020  Glucose 70 - 99 mg/dL 793(J) 030(S) 923(R)  BUN 6 - 20 mg/dL 18 19 16   Creatinine 0.61 - 1.24 mg/dL 0.07 6.22  Sodium 135 - 145 mmol/L 136 134(L) 133(L)  Potassium 3.5 - 5.1 mmol/L 4.7 4.3 4.0  Chloride 98 - 111 mmol/L 100 100 100  CO2 22 - 32 mmol/L 30 28 28   Calcium 8.9 - 10.3 mg/dL 6.33) ) 3.5(K)  Total Protein 6.5 - 8.1 g/dL - 6.4(L) 6.7  Total Bilirubin 0.3 - 1.2 mg/dL - 0.6 0.5  Alkaline Phos 38 - 126 U/L - 107 103  AST 15 - 41 U/L - 32 27  ALT 0 - 44 U/L - 20 17     Pertinent Imaging today Plain films and CT images have been personally visualized and interpreted; radiology reports have been reviewed. Decision making incorporated into the Impression / Recommendations.  I have spent more than 35 minutes for this patient encounter including review of prior medical records, coordination of care  with greater than 50% of time being face to face/counseling and discussing diagnostics/treatment plan with the patient/family.  Electronically signed by:   5.6(Y, MD Infectious Disease  Physician [facility] Pager: [phone]

## 2020-10-16 NOTE — Plan of Care (Signed)

## 2020-10-17 ENCOUNTER — Inpatient Hospital Stay (HOSPITAL_COMMUNITY): Payer: Self-pay

## 2020-10-17 LAB — CBC WITH DIFFERENTIAL/PLATELET
Abs Immature Granulocytes: 0.04 10*3/uL (ref 0.00–0.07)
Basophils Absolute: 0.1 10*3/uL (ref 0.0–0.1)
Basophils Relative: 1 %
Eosinophils Absolute: 0.2 10*3/uL (ref 0.0–0.5)
Eosinophils Relative: 3 %
HCT: 30.1 % — ABNORMAL LOW (ref 39.0–52.0)
Hemoglobin: 9.5 g/dL — ABNORMAL LOW (ref 13.0–17.0)
Immature Granulocytes: 1 %
Lymphocytes Relative: 31 %
Lymphs Abs: 2.1 10*3/uL (ref 0.7–4.0)
MCH: 29.9 pg (ref 26.0–34.0)
MCHC: 31.6 g/dL (ref 30.0–36.0)
MCV: 94.7 fL (ref 80.0–100.0)
Monocytes Absolute: 0.3 10*3/uL (ref 0.1–1.0)
Monocytes Relative: 4 %
Neutro Abs: 4 10*3/uL (ref 1.7–7.7)
Neutrophils Relative %: 60 %
Platelets: 305 10*3/uL (ref 150–400)
RBC: 3.18 MIL/uL — ABNORMAL LOW (ref 4.22–5.81)
RDW: 14.8 % (ref 11.5–15.5)
WBC: 6.7 10*3/uL (ref 4.0–10.5)
nRBC: 0 % (ref 0.0–0.2)

## 2020-10-17 LAB — BASIC METABOLIC PANEL
Anion gap: 3 — ABNORMAL LOW (ref 5–15)
BUN: 20 mg/dL (ref 6–20)
CO2: 29 mmol/L (ref 22–32)
Calcium: 8.6 mg/dL — ABNORMAL LOW (ref 8.9–10.3)
Chloride: 105 mmol/L (ref 98–111)
Creatinine, Ser: 0.8 mg/dL (ref 0.61–1.24)
GFR, Estimated: >60 mL/min (ref >60)
Glucose, Bld: 135 mg/dL — ABNORMAL HIGH (ref 70–99)
Potassium: 4.2 mmol/L (ref 3.5–5.1)
Sodium: 137 mmol/L (ref 135–145)

## 2020-10-17 LAB — VANCOMYCIN, TROUGH: Vancomycin Tr: 8 ug/mL — ABNORMAL LOW (ref 15–20)

## 2020-10-17 IMAGING — MR MR LUMBAR SPINE WO/W CM
4 of 7 series · 18 of 48 positions shown · IV contrast (gadavist)
Comparison: Intraoperative radiographs of the thoracic spine
[DATE], thoracic MRI [DATE] and CTs of the chest, abdomen
and pelvis [DATE].

CLINICAL DATA: Thoracic laminectomy for epidural abscess
[DATE].

EXAM:
MRI LUMBAR SPINE WITHOUT AND WITH CONTRAST
TECHNIQUE: Multiplanar and multiecho pulse sequences of the lumbar spine were
obtained without and with intravenous contrast.
CONTRAST:  10mL GADAVIST GADOBUTROL 1 MMOL/ML IV SOLN

[Series 2: T2 · sagittal · 4.0mm · 0.55mm/px · 5 of 15 slices shown (1 of 2)]
[im 1/15]
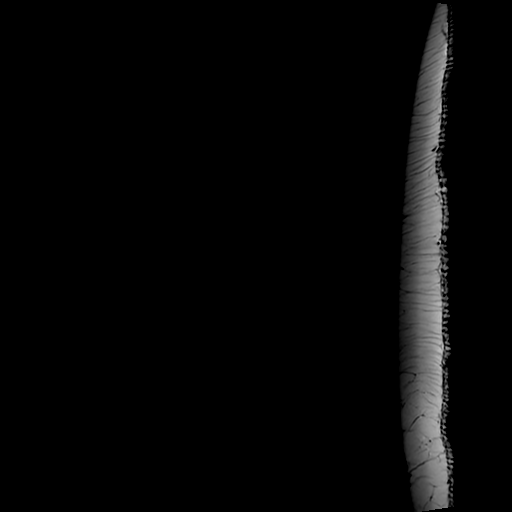
[im 4/15]
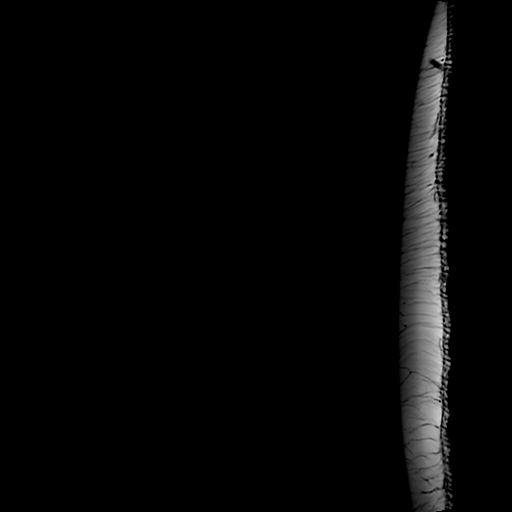
[im 8/15]
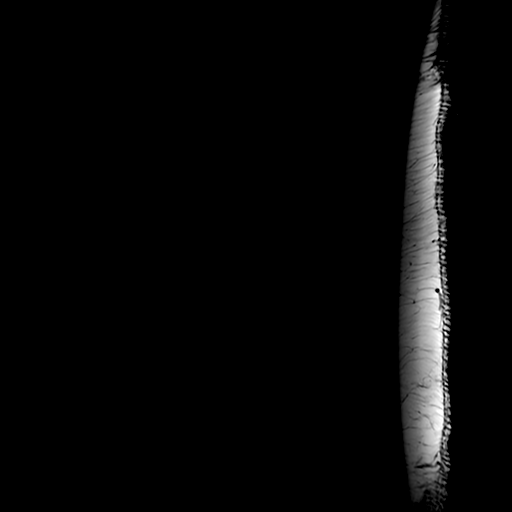
[im 11/15]
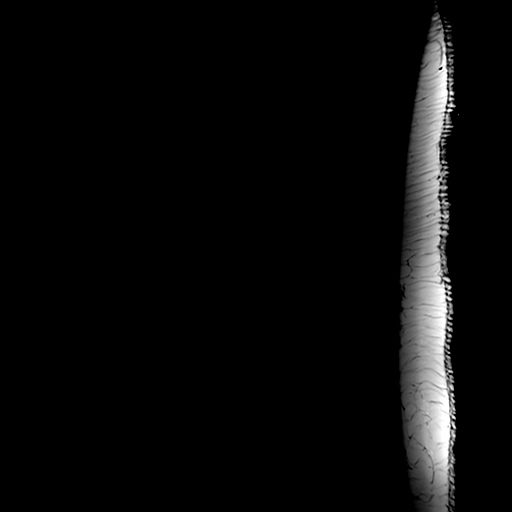
[im 15/15]
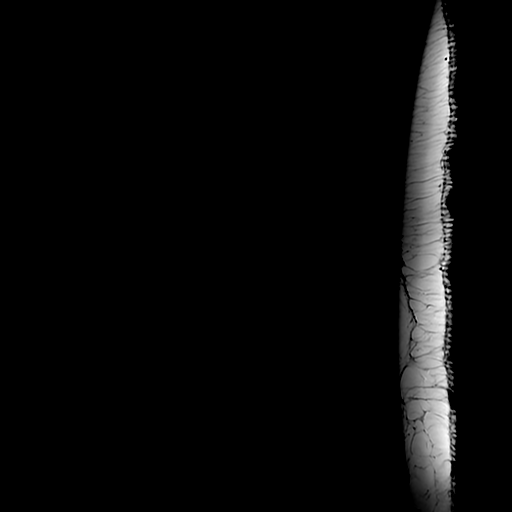

[Series 4: T1 · sagittal · 4.0mm · 0.55mm/px · 3 of 15 slices shown (1 of 2)]
[im 1/15]
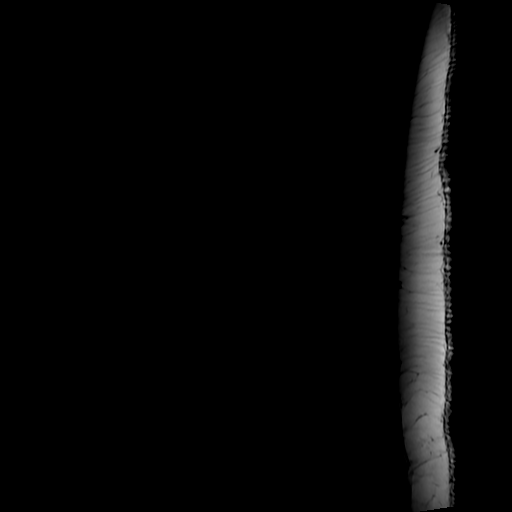
[im 10/15]
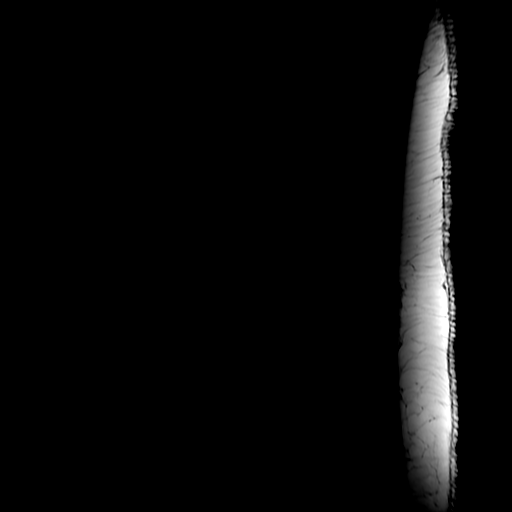
[im 15/15]
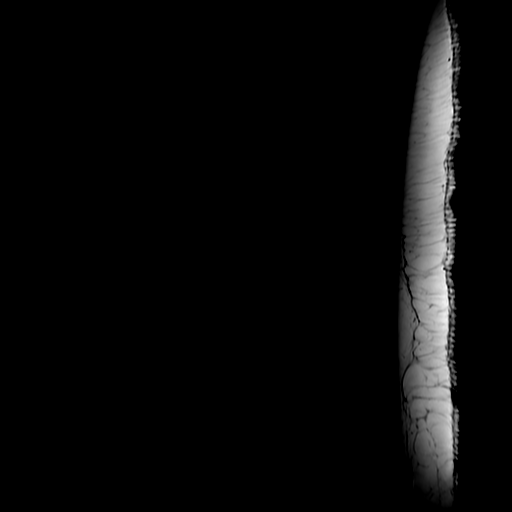

[Series 5: T2 · axial · 4.0mm · 0.39mm/px · z∈[-154,+30]mm · 7 of 36 slices shown (2 of 2)]
[im 1/36]
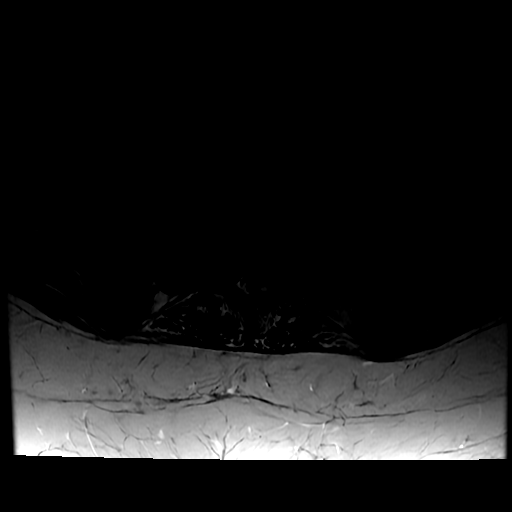
[im 4/36]
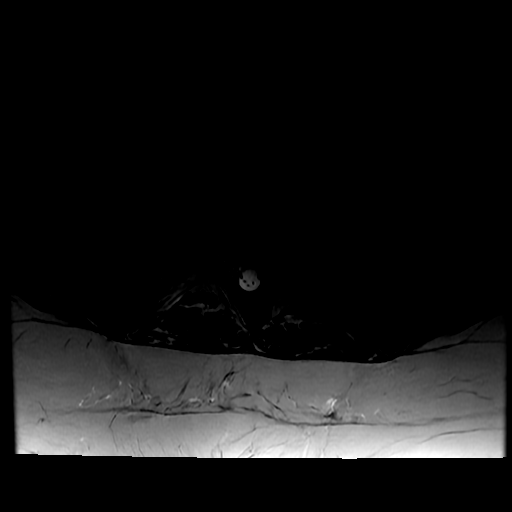
[im 12/36]
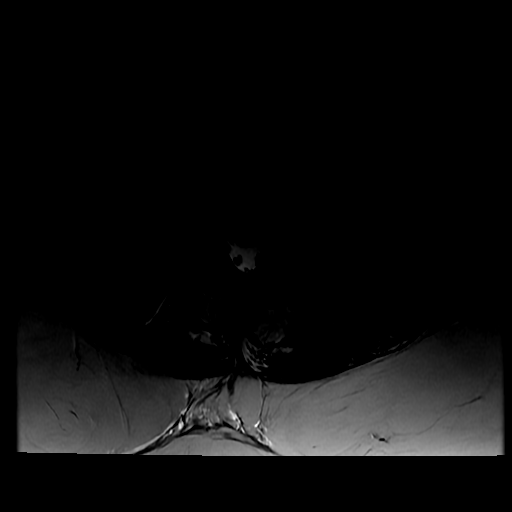
[im 16/36]
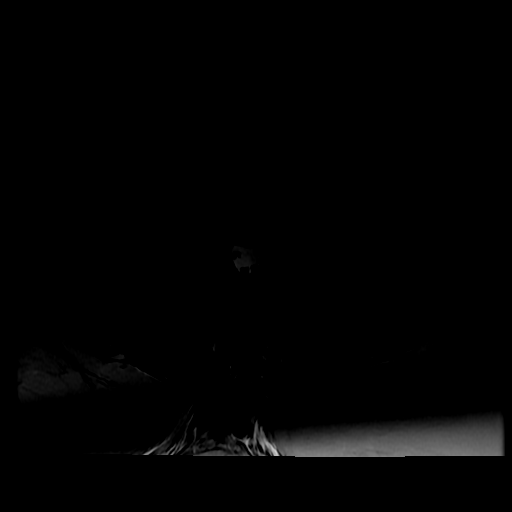
[im 20/36]
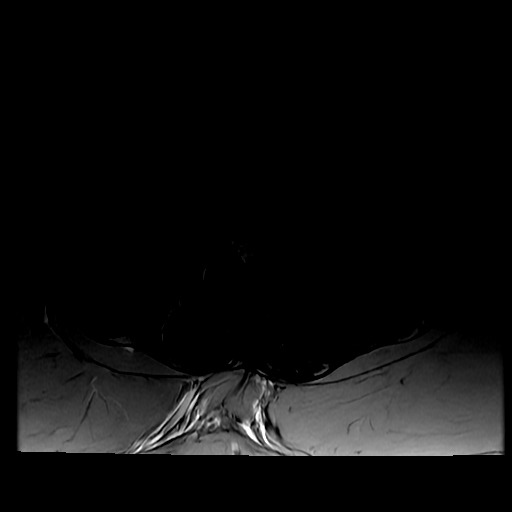
[im 24/36]
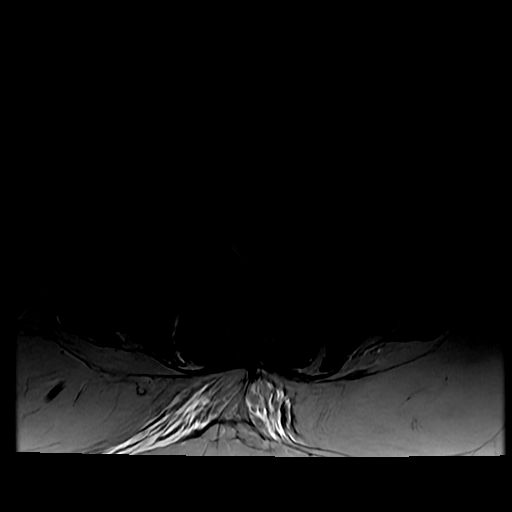
[im 32/36]
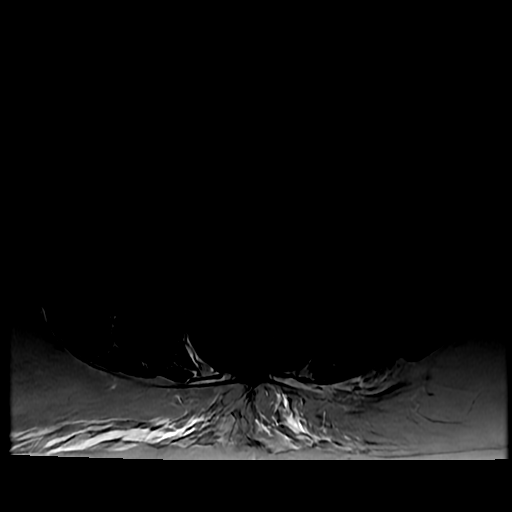

[Series 6: T1 · axial · 4.0mm · 0.39mm/px · z∈[-140,+30]mm · 3 of 36 slices shown (2 of 2)]
[im 4/36]
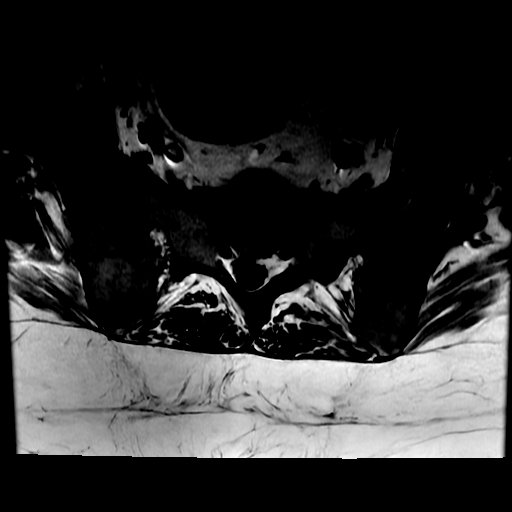
[im 20/36]
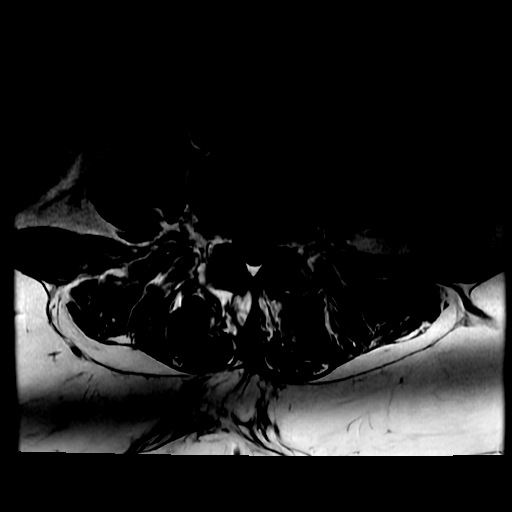
[im 32/36]
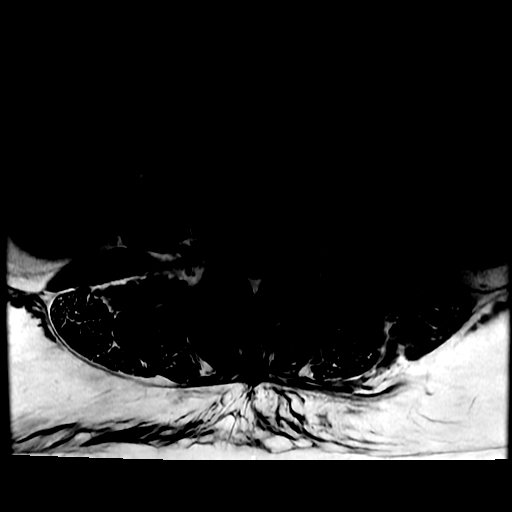

[18 of 48 positions shown; findings below may reference images not displayed]

FINDINGS: Segmentation: Transitional lumbosacral anatomy. As correlated with
prior imaging, there is a transitional, partially lumbarized S1
segment.

Alignment:  Physiologic

Vertebrae: Changes of diskitis and osteomyelitis at T11-12 are
incompletely visualized. No evidence of disc space infection or
osteomyelitis in the lumbar spine. There are chronic endplate
degenerative changes at L5-S1 which are asymmetric to the right.
Diffusely decreased T1 marrow signal again noted. There are
degenerative changes at the sacroiliac joints. No evidence of
sacroiliitis.

Conus medullaris and cauda equina: Conus extends to the L1-2 level.
Conus and cauda equina appear normal.

Paraspinal and other soft tissues: No significant lumbar paraspinal
abnormalities. T2 hyperintensity and enhancement within the
posterior soft tissues of the lower thoracic spine attributed to
recent surgery.

Disc levels:

No significant disc space findings from L1-2 through L3-4.

L4-5: Loss of disc height with annular disc bulging and a shallow
left paracentral disc protrusion. Mild facet and ligamentous
hypertrophy. There is mild narrowing of the left lateral recess with
possible left L5 nerve root encroachment. Both foramina are patent.

L5-S1: Chronic degenerative disc disease with loss of disc height,
annular disc bulging and endplate osteophytes asymmetric to the
right. Mild facet and ligamentous hypertrophy. These factors
contribute to mild spinal stenosis and mild lateral recess narrowing
bilaterally. There is severe osseous foraminal narrowing on the
right at L5-S1.

S1-2: Transitional disc space level demonstrates no acquired
abnormality.
IMPRESSION: 1. Partially imaged findings of discitis/osteomyelitis at T11-12
status post recent laminectomy.
2. No evidence of discitis or osteomyelitis in the lumbar spine.
3. Shallow left paracentral disc protrusion at L4-5 contributing to
left lateral recess narrowing and possible left L5 nerve root
encroachment.
4. Chronic degenerative disc disease at L5-S1 with endplate
osteophytes asymmetric to the right contributing to chronic right
foraminal narrowing.

## 2020-10-17 MED ORDER — VANCOMYCIN HCL 1000 MG/200ML IV SOLN
1000.0000 mg | Freq: Two times a day (BID) | INTRAVENOUS | Status: DC
  Administered 2020-10-18 – 2020-11-27 (×81): 1000 mg via INTRAVENOUS
  Filled 2020-10-17 (×86): qty 200

## 2020-10-17 MED ORDER — BUPRENORPHINE HCL-NALOXONE HCL 2-0.5 MG SL SUBL
2.0000 | SUBLINGUAL_TABLET | Freq: Two times a day (BID) | SUBLINGUAL | Status: DC
  Administered 2020-10-17 – 2020-11-27 (×82): 2 via SUBLINGUAL
  Filled 2020-10-17 (×5): qty 2
  Filled 2020-10-17: qty 1
  Filled 2020-10-17 (×22): qty 2
  Filled 2020-10-17: qty 1
  Filled 2020-10-17 (×24): qty 2
  Filled 2020-10-17: qty 1
  Filled 2020-10-17 (×11): qty 2
  Filled 2020-10-17 (×2): qty 1
  Filled 2020-10-17 (×12): qty 2
  Filled 2020-10-17: qty 1
  Filled 2020-10-17 (×5): qty 2

## 2020-10-17 MED ORDER — ALPRAZOLAM 0.25 MG PO TABS
0.2500 mg | ORAL_TABLET | Freq: Two times a day (BID) | ORAL | Status: DC | PRN
  Administered 2020-10-17: 0.25 mg via ORAL
  Filled 2020-10-17: qty 1

## 2020-10-17 MED ORDER — KETOROLAC TROMETHAMINE 30 MG/ML IJ SOLN
30.0000 mg | Freq: Four times a day (QID) | INTRAMUSCULAR | Status: AC | PRN
  Administered 2020-10-17 (×2): 30 mg via INTRAVENOUS
  Filled 2020-10-17 (×2): qty 1

## 2020-10-17 MED ORDER — HYDROXYZINE HCL 10 MG PO TABS
10.0000 mg | ORAL_TABLET | Freq: Three times a day (TID) | ORAL | Status: DC | PRN
  Administered 2020-10-17 – 2020-11-27 (×112): 10 mg via ORAL
  Filled 2020-10-17 (×137): qty 1

## 2020-10-17 MED ORDER — GADOBUTROL 1 MMOL/ML IV SOLN
10.0000 mL | Freq: Once | INTRAVENOUS | Status: AC | PRN
  Administered 2020-10-17: 10 mL via INTRAVENOUS

## 2020-10-17 MED ORDER — KETOROLAC TROMETHAMINE 30 MG/ML IJ SOLN
30.0000 mg | Freq: Four times a day (QID) | INTRAMUSCULAR | Status: AC | PRN
  Administered 2020-10-17 – 2020-10-22 (×15): 30 mg via INTRAVENOUS
  Filled 2020-10-17 (×16): qty 1

## 2020-10-17 MED ORDER — IBUPROFEN 200 MG PO TABS
600.0000 mg | ORAL_TABLET | Freq: Four times a day (QID) | ORAL | Status: DC | PRN
  Administered 2020-10-17: 600 mg via ORAL
  Filled 2020-10-17: qty 3

## 2020-10-17 NOTE — Progress Notes (Signed)
Subjective: Patient reports moderately severe back pain. Seems frustrated with his pain medication regimen. No leg pain NT or weakness  Objective: Vital signs in last 24 hours: Temp:  [98 F (36.7 C)-99 F (37.2 C)] 98 F (36.7 C) (05/04 1702) Pulse Rate:  [94-107] 102 (05/04 1702) Resp:  [18-20] 20 (05/04 1702) BP: (122-138)/(75-89) 138/88 (05/04 1702) SpO2:  [93 %-97 %] 95 % (05/04 1702)  Intake/Output from previous day: 05/03 0701 - 05/04 0700 In: 643.9 [I.V.:38.7; IV Piggyback:605.3] Out: 550 [Urine:550] Intake/Output this shift: Total I/O In: -  Out: 550 [Urine:500; Drains:50]  Neurologic: Grossly normal  Lab Results: Lab Results  Component Value Date   WBC 6.7 10/17/2020   HGB 9.5 (L) 10/17/2020   HCT 30.1 (L) 10/17/2020   MCV 94.7 10/17/2020   PLT 305 10/17/2020   Lab Results  Component Value Date   INR 1.0 10/14/2020   BMET Lab Results  Component Value Date   NA 137 10/17/2020   K 4.2 10/17/2020   CL 105 10/17/2020   CO2 29 10/17/2020   GLUCOSE 135 (H) 10/17/2020   BUN 20 10/17/2020   CREATININE 0.80 10/17/2020   CALCIUM 8.6 (L) 10/17/2020    Studies/Results: MR Lumbar Spine W Wo Contrast  Result Date: 10/17/2020 CLINICAL DATA:  Thoracic laminectomy for epidural abscess 10/15/2020. EXAM: MRI LUMBAR SPINE WITHOUT AND WITH CONTRAST TECHNIQUE: Multiplanar and multiecho pulse sequences of the lumbar spine were obtained without and with intravenous contrast. CONTRAST:  8mL GADAVIST GADOBUTROL 1 MMOL/ML IV SOLN COMPARISON:  Intraoperative radiographs of the thoracic spine 10/15/2020, thoracic MRI 10/14/2020 and CTs of the chest, abdomen and pelvis 10/06/2020. FINDINGS: Segmentation: Transitional lumbosacral anatomy. As correlated with prior imaging, there is a transitional, partially lumbarized S1 segment. Alignment:  Physiologic Vertebrae: Changes of diskitis and osteomyelitis at T11-12 are incompletely visualized. No evidence of disc space infection or  osteomyelitis in the lumbar spine. There are chronic endplate degenerative changes at L5-S1 which are asymmetric to the right. Diffusely decreased T1 marrow signal again noted. There are degenerative changes at the sacroiliac joints. No evidence of sacroiliitis. Conus medullaris and cauda equina: Conus extends to the L1-2 level. Conus and cauda equina appear normal. Paraspinal and other soft tissues: No significant lumbar paraspinal abnormalities. T2 hyperintensity and enhancement within the posterior soft tissues of the lower thoracic spine attributed to recent surgery. Disc levels: No significant disc space findings from L1-2 through L3-4. L4-5: Loss of disc height with annular disc bulging and a shallow left paracentral disc protrusion. Mild facet and ligamentous hypertrophy. There is mild narrowing of the left lateral recess with possible left L5 nerve root encroachment. Both foramina are patent. L5-S1: Chronic degenerative disc disease with loss of disc height, annular disc bulging and endplate osteophytes asymmetric to the right. Mild facet and ligamentous hypertrophy. These factors contribute to mild spinal stenosis and mild lateral recess narrowing bilaterally. There is severe osseous foraminal narrowing on the right at L5-S1. S1-2: Transitional disc space level demonstrates no acquired abnormality. IMPRESSION: 1. Partially imaged findings of discitis/osteomyelitis at T11-12 status post recent laminectomy. 2. No evidence of discitis or osteomyelitis in the lumbar spine. 3. Shallow left paracentral disc protrusion at L4-5 contributing to left lateral recess narrowing and possible left L5 nerve root encroachment. 4. Chronic degenerative disc disease at L5-S1 with endplate osteophytes asymmetric to the right contributing to chronic right foraminal narrowing. Electronically Signed   By: Carey Bullocks M.D.   On: 10/17/2020 15:34    Assessment/Plan: Postop day  2 thoracic lami for evacuation of epidural  abscess. Seems to be having pain control issues. Encouraged him to talk to the attending about this. Continue therapies. Will sign off for now.   LOS: 10 days    Tiana Loft Zebulin Siegel 10/17/2020, 5:09 PM

## 2020-10-17 NOTE — Progress Notes (Signed)
PROGRESS NOTE    Minoru Chap  ZOX:[id number] DOB: 08-26-1990 DOA: 10/07/2020 PCP: Pcp, No   Brief Narrative:  Derek Woods is a 30 y.o. M, with a pertinent pmx of daily heroin IVDA (previous suboxone use), asthma, morbid obesity (BMI 52), 1 PPD smoking, possible OSA, presented to [facility] on 4/24 with complaints of back pain, right flank pain, and one week with shortness of breath . CT of his chest revealed a large right empyema. He was admitted to [facility] by the hospitalist team.  He was taken to the OR on 4/26 with Dr. Laneta Simmers for a VATS and drainage of right empyema, however a right thoracotomy had to be prefomed. He was a difficult intubation for the procedure and he was unable to be weaned from the ventilator post operatively. There was reported difficulty with oxygenation during the procedure. PCCM was consulted for ventilator management who assumed care as primary team.  Patient was extubated on 10/10/2020 and was transferred under [facility] care on 10/11/2020.  Posterior chest tube was removed on 10/12/2020.  Thoracotomy culture growing MRSA.  Antibiotics de-escalated to vancomycin.  ID consulted on 10/13/2020.  MRI thoracic spine was done which showed T11/T12 discitis and possible abscess. Status post decompressive thoracic laminectomy T11-12 by neurosurgery on 10/15/2020.  Has a Hemovac.  Lumbar MRI is still pending.  Hep C antibodies positive.  Assessment & Plan:   Principal Problem:   Empyema lung (HCC) Active Problems:   Polysubstance dependence including opioid type drug with complication, episodic abuse (HCC)   Morbid obesity with BMI of 50.0-59.9, adult (HCC)   Tobacco dependence   MRSA infection   Abscess in epidural space of thoracic spine  Acute respiratory failure with hypercarbia and hypoxia/right MRSA empyema s/p right thoracotomy on 10/09/2020/suspected OSA/OHS: Patient required intubation on 10/09/2020 and was extubated on 10/10/2020.  S/p thoracotomy as well as chest tube, one of them was  removed few days ago and the other one removed on 10/14/2020. Cultures growing MRSA.  He is on vancomycin which we will continue.  Seen by ID.  Cardiothoracic surgery signed off.  Hypoxia resolved.  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  Has a Hemovac.  Lumbar MRI still pending.  Management per neurosurgery.  Requesting pain medications, will start him on Toradol.  Pain not controlled, adjusting pain meds.  Neurosurgery following, can DC Hemovac when it puts out less than 30 mL over an 8-hour shift.  Anxiety: Patient reports that hydroxyzine works better than Xanax, switched to same.  Essential hypertension: Blood pressure was elevated, amlodipine was started.  Reasonably controlled.  Continue that and as needed hydralazine.  Polysubstance abuse: Patient reports that his pain is not adequately controlled with current dose of Suboxone, asking to increase it; Toradol and Flexeril.  Consider increasing Suboxone.  Normocytic anemia: Hemoglobin is stable over 9.  Monitor CBC periodically.  Tobacco dependence: Smoking cessation counseling provided.  Positive hepatitis C antibody: No known previous history of hepatitis C infection.  Antibodies positive.  Will defer to ID for management on that.  Body mass index is 52.2 kg/m./Morbid obesity.   DVT prophylaxis: SCD's Start: 10/15/20 1744 SCD's Start: 10/09/20 1716   Code Status: Full Code  Family Communication: None present at bedside.  Plan of care discussed with patient in length and he verbalized understanding and agreed with it.  Status is: Inpatient  Remains inpatient appropriate because:Inpatient level of care appropriate due to severity of illness  Dispo: The patient is from: Home              Anticipated d/c is to: Home              Patient currently is not medically stable to d/c.   Difficult to place patient No     Consultants:    CT surgery  ID  Neurosurgery  Procedures:   Chest tube placement, thoracotomy and intubation  Antimicrobials:  Anti-infectives (From admission, onward)   Start     Dose/Rate Route Frequency Ordered Stop   10/15/20 1830  ceFAZolin (ANCEF) IVPB 2g/100 mL premix        2 g 200 mL/hr over 30 Minutes Intravenous Every 8 hours 10/15/20 1744 10/16/20 0659   10/13/20 1200  vancomycin (VANCOREADY) IVPB 2000 mg/400 mL        2,000 mg 200 mL/hr over 120 Minutes Intravenous Every 24 hours 10/12/20 1115     10/12/20 1215  vancomycin (VANCOREADY) IVPB 500 mg/100 mL        500 mg 100 mL/hr over 60 Minutes Intravenous  Once 10/12/20 1115 10/12/20 1329   10/12/20 0600  vancomycin (VANCOREADY) IVPB 1500 mg/300 mL  Status:  Discontinued        1,500 mg 150 mL/hr over 120 Minutes Intravenous Every 24 hours 10/12/20 0541 10/12/20 0556   10/12/20 0600  vancomycin (VANCOREADY) IVPB 1500 mg/300 mL  Status:  Discontinued        1,500 mg 150 mL/hr over 120 Minutes Intravenous Every 18 hours 10/12/20 0556 10/12/20 1115   10/11/20 1343  vancomycin variable dose per unstable renal function (pharmacist dosing)  Status:  Discontinued         Does not apply See admin instructions 10/11/20 1344 10/12/20 0556   10/10/20 0400  vancomycin (VANCOREADY) IVPB 1250 mg/250 mL  Status:  Discontinued        1,250 mg 166.7 mL/hr over 90 Minutes Intravenous Every 8 hours 10/09/20 1835 10/11/20 1344   10/09/20 2000  vancomycin (VANCOREADY) IVPB 1250 mg/250 mL  Status:  Discontinued        1,250 mg 166.7 mL/hr over 90 Minutes Intravenous Every 8 hours 10/09/20 1117 10/09/20 1835   10/09/20 1900  vancomycin (VANCOCIN) 2,500 mg in sodium chloride 0.9 % 500 mL IVPB        2,500 mg 250 mL/hr over 120 Minutes Intravenous  Once 10/09/20 1835 10/09/20 2137   10/09/20 1200  vancomycin (VANCOCIN) 2,500 mg in sodium chloride 0.9 % 500 mL IVPB  Status:  Discontinued        2,500 mg 250 mL/hr over 120 Minutes Intravenous  Once  10/09/20 1113 10/09/20 1835   10/07/20 2200  vancomycin (VANCOREADY) IVPB 2000 mg/400 mL  Status:  Discontinued        2,000 mg 200 mL/hr over 120 Minutes Intravenous Every 12 hours 10/07/20 1251 10/07/20 1437   10/07/20 2000  ceFEPIme (MAXIPIME) 2 g in sodium chloride 0.9 % 100 mL IVPB  Status:  Discontinued        2 g 200 mL/hr over 30 Minutes Intravenous Every 8 hours 10/07/20 1251 10/11/20 1342   10/07/20 1145  ceFEPIme (MAXIPIME) 2 g in sodium chloride 0.9 % 100 mL IVPB        2 g 200 mL/hr over 30 Minutes Intravenous  Once 10/07/20 1048 10/07/20 1351   10/07/20 1100  vancomycin (VANCOCIN) 2,500 mg in sodium chloride 0.9 % 500 mL IVPB  Status:  Discontinued  2,500 mg 250 mL/hr over 120 Minutes Intravenous  Once 10/07/20 1048 10/07/20 1437         Subjective: Reports ongoing pain not controlled by current regimen, asking to increase Suboxone to 8 mg like it was before.  Also states that the Vistaril helped better than Xanax with his anxiety.  No other complaints reported.  Objective: Vitals:   10/16/20 1940 10/16/20 2320 10/17/20 0417 10/17/20 0928  BP: 122/79 132/75 130/84 128/86  Pulse: (!) 107 94 100 99  Resp: Temp: 98.9 F (37.2 C) 99 F (37.2 C) 98.6 F (37 C) 98.5 F (36.9 C)  TempSrc: Oral Oral Oral Oral  SpO2: 93% 97% 96% 94%  Weight:      Height:        Intake/Output Summary (Last 24 hours) at 10/17/2020 1238 Last data filed at 10/17/2020 1100 Gross per 24 hour  Intake 643.9 ml  Output 1100 ml  Net -456.1 ml   Filed Weights   10/07/20 1006  Weight: (!) 146.7 kg    Examination:  General exam: Pleasant young male, moderately built and morbidly obese lying comfortably propped up in bed without distress. Respiratory system: Clear to auscultation. Respiratory effort normal. Cardiovascular system: S1 & S2 heard, RRR. No JVD, murmurs, rubs, gallops or clicks. No pedal edema.  Telemetry personally reviewed: SR- ST in the low  100s. Gastrointestinal system: Abdomen is nondistended, soft and nontender. No organomegaly or masses felt. Normal bowel sounds heard. Central nervous system: Alert and oriented. No focal neurological deficits. Extremities: Symmetric 5 x 5 power in upper extremities.  Right lower extremity grade 5 x 5, left lower extremity grade 4+ by 5 power. Skin: No rashes, lesions or ulcers Psychiatry: Judgement and insight appear normal. Mood & affect appropriate.      Data Reviewed: I have personally reviewed following labs and imaging studies  CBC: Recent Labs  Lab 10/13/20 0339 10/14/20 0331 10/15/20 0353 10/16/20 0422 10/17/20 0500  WBC 6.2 7.2 6.1 8.6 6.7  NEUTROABS 3.7 4.8 3.5 6.9 4.0  HGB 9.1* 9.7* 9.3* 9.5* 9.5*  HCT 28.2* 30.7* 29.1* 29.5* 30.1*  MCV 93.4 94.5 93.9 92.5 94.7  PLT 262 312 269 322 305   Basic Metabolic Panel: Recent Labs  Lab 10/11/20 0435 10/12/20 0435 10/13/20 0339 10/15/20 0353 10/17/20 0500  NA 134* 133* 134* 136 137  K 4.2 4.0 4.3 4.7 4.2  CL 98 100 100 100 105  CO2 GLUCOSE 110* 120* 123* 109* 135*  BUN CREATININE 0.90 0.81 0.76 0.82 0.80  CALCIUM 8.8* 8.6* 8.7* 8.7* 8.6*   GFR: Estimated Creatinine Clearance: 183.6 mL/min (by C-G formula based on SCr of 0.8 mg/dL). Liver Function Tests: Recent Labs  Lab 10/11/20 0435 10/12/20 0435 10/13/20 0339  AST 25 27 32  ALT ALKPHOS 92 103 107  BILITOT 0.5 0.5 0.6  PROT 6.6 6.7 6.4*  ALBUMIN 2.2* 2.2* 2.1*   Coagulation Profile: Recent Labs  Lab 10/14/20 0901  INR 1.0   CBG: Recent Labs  Lab 10/11/20 1128 10/11/20 1559  GLUCAP 140* 128*     Recent Results (from the past 240 hour(s))  Surgical PCR screen     Status: Abnormal   Collection Time: 10/08/20  8:58 PM   Specimen: Nasal Mucosa; Nasal Swab  Result Value Ref Range Status   MRSA, PCR POSITIVE (A) NEGATIVE Final    Comment: RESULT CALLED  TO, READ BACK BY AND VERIFIED WITH: O.P. RN  10/09/20 0015 JDW    Staphylococcus aureus POSITIVE (A) NEGATIVE Final    Comment: (NOTE) The Xpert SA Assay (FDA approved for NASAL specimens in patients 39 years of age and older), is one component of a comprehensive surveillance program. It is not intended to diagnose infection nor to guide or monitor treatment. Performed at [facility], [address] [place], [place] [address]   Fungus Culture With Stain     Status: None (Preliminary result)   Collection Time: 10/09/20  3:46 PM   Specimen: PATH Cytology Pleural fluid; Body Fluid  Result Value Ref Range Status   Fungus Stain Final report  Final    Comment: (NOTE) Performed At: [facility] [address] [place], [place] [id number] Jolene Schimke MD XB:[id number]    Fungus (Mycology) Culture PENDING  Incomplete   Fungal Source FLUID  Final    Comment: PLEURAL RIGHT Performed at [facility], [address] [place], [place] [address]   Aerobic/Anaerobic Culture w Gram Stain (surgical/deep wound)     Status: None   Collection Time: 10/09/20  3:46 PM   Specimen: PATH Cytology Pleural fluid; Body Fluid  Result Value Ref Range Status   Specimen Description FLUID PLEURAL RIGHT  Final   Special Requests NONE  Final   Gram Stain   Final    RARE WBC PRESENT,BOTH PMN AND MONONUCLEAR FEW GRAM POSITIVE COCCI IN PAIRS IN CLUSTERS    Culture   Final    ABUNDANT METHICILLIN RESISTANT STAPHYLOCOCCUS AUREUS NO ANAEROBES ISOLATED Performed at [facility], [address], [place], [place] [address]    Report Status 10/15/2020 FINAL  Final   Organism ID, Bacteria METHICILLIN RESISTANT STAPHYLOCOCCUS AUREUS  Final      Susceptibility   Methicillin resistant staphylococcus aureus - MIC*    CIPROFLOXACIN >=8 RESISTANT Resistant     ERYTHROMYCIN >=8 RESISTANT Resistant     GENTAMICIN <=0.5 SENSITIVE Sensitive     OXACILLIN >=4 RESISTANT Resistant     TETRACYCLINE >=16 RESISTANT Resistant     VANCOMYCIN  <=0.5 SENSITIVE Sensitive     TRIMETH/SULFA <=10 SENSITIVE Sensitive     CLINDAMYCIN <=0.25 SENSITIVE Sensitive     RIFAMPIN <=0.5 SENSITIVE Sensitive     Inducible Clindamycin NEGATIVE Sensitive     * ABUNDANT METHICILLIN RESISTANT STAPHYLOCOCCUS AUREUS  Acid Fast Smear (AFB)     Status: None   Collection Time: 10/09/20  3:46 PM   Specimen: PATH Cytology Pleural fluid; Body Fluid  Result Value Ref Range Status   AFB Specimen Processing Concentration  Final   Acid Fast Smear Negative  Final    Comment: (NOTE) Performed At: [facility] [address] [place], [place] [id number] Jolene Schimke MD KG:[id number]    Source (AFB) FLUID  Final    Comment: PLEURAL RIGHT Performed at [facility], [address], [place], [place] [address]   Fungus Culture Result     Status: None   Collection Time: 10/09/20  3:46 PM  Result Value Ref Range Status   Result 1 Comment  Final    Comment: (NOTE) KOH/Calcofluor preparation:  no fungus observed. Performed At: [facility] [address] [place], [place] [id number] Jolene Schimke MD OV:[id number]   Fungus Culture With Stain     Status: None (Preliminary result)   Collection Time: 10/09/20  3:52 PM   Specimen: Soft Tissue, Other  Result Value Ref Range Status  Fungus Stain Final report  Final    Comment: (NOTE) Performed At: [facility] [address] [place], [place] [id number] Jolene SchimkeNagendra Sanjai MD WU:[id number]:[phone]    Fungus (Mycology) Culture PENDING  Incomplete   Fungal Source TISSUE  Final    Comment: PLEURAL PEEL Performed at [facility], [address] [place], [place] [address]   Aerobic/Anaerobic Culture w Gram Stain (surgical/deep wound)     Status: None   Collection Time: 10/09/20  3:52 PM   Specimen: Soft Tissue, Other  Result Value Ref Range Status   Specimen Description TISSUE  Final   Special Requests PLEURAL PEEL  Final   Gram Stain   Final    RARE WBC PRESENT,BOTH PMN AND  MONONUCLEAR RARE GRAM POSITIVE COCCI IN CLUSTERS    Culture   Final    MODERATE METHICILLIN RESISTANT STAPHYLOCOCCUS AUREUS NO ANAEROBES ISOLATED Performed at [facility], [address] [place], [place] [address]    Report Status 10/15/2020 FINAL  Final   Organism ID, Bacteria METHICILLIN RESISTANT STAPHYLOCOCCUS AUREUS  Final      Susceptibility   Methicillin resistant staphylococcus aureus - MIC*    CIPROFLOXACIN >=8 RESISTANT Resistant     ERYTHROMYCIN >=8 RESISTANT Resistant     GENTAMICIN <=0.5 SENSITIVE Sensitive     OXACILLIN >=4 RESISTANT Resistant     TETRACYCLINE >=16 RESISTANT Resistant     VANCOMYCIN <=0.5 SENSITIVE Sensitive     TRIMETH/SULFA <=10 SENSITIVE Sensitive     CLINDAMYCIN <=0.25 SENSITIVE Sensitive     RIFAMPIN <=0.5 SENSITIVE Sensitive     Inducible Clindamycin NEGATIVE Sensitive     * MODERATE METHICILLIN RESISTANT STAPHYLOCOCCUS AUREUS  Acid Fast Smear (AFB)     Status: None   Collection Time: 10/09/20  3:52 PM   Specimen: Soft Tissue, Other  Result Value Ref Range Status   AFB Specimen Processing Comment  Final    Comment: Tissue Grinding and Digestion/Decontamination   Acid Fast Smear Negative  Final    Comment: (NOTE) Performed At: [facility] [address] [place], [place] [id number] Jolene SchimkeNagendra Sanjai MD MW:[id number]:[phone]    Source (AFB) TISSUE  Final    Comment: PLEURAL PEEL Performed at [facility], [address] [place], [place] [address]   Fungus Culture Result     Status: None   Collection Time: 10/09/20  3:52 PM  Result Value Ref Range Status   Result 1 Comment  Final    Comment: (NOTE) KOH/Calcofluor preparation:  no fungus observed. Performed At: [facility] [address] [place], [place] [id number] Jolene SchimkeNagendra Sanjai MD VZ:[id number]:[phone]   Aerobic/Anaerobic Culture w Gram Stain (surgical/deep wound)     Status: None (Preliminary result)   Collection Time: 10/15/20  3:01 PM   Specimen: PATH Other; Tissue   Result Value Ref Range Status   Specimen Description ABSCESS  Final   Special Requests T11 12 EPIDURAL PATIENT ON FOLLOWING VANC  Final   Gram Stain   Final    NO WBC SEEN NO ORGANISMS SEEN Performed at [facility], [address] [place], [place] [address]    Culture RARE METHICILLIN RESISTANT STAPHYLOCOCCUS AUREUS  Final   Report Status PENDING  Incomplete   Organism ID, Bacteria METHICILLIN RESISTANT STAPHYLOCOCCUS AUREUS  Final      Susceptibility   Methicillin resistant staphylococcus aureus - MIC*    CIPROFLOXACIN >=8 RESISTANT Resistant     ERYTHROMYCIN >=8 RESISTANT Resistant     GENTAMICIN <=0.5 SENSITIVE  Sensitive     OXACILLIN >=4 RESISTANT Resistant     TETRACYCLINE >=16 RESISTANT Resistant     VANCOMYCIN <=0.5 SENSITIVE Sensitive     TRIMETH/SULFA <=10 SENSITIVE Sensitive     CLINDAMYCIN <=0.25 SENSITIVE Sensitive     RIFAMPIN <=0.5 SENSITIVE Sensitive     Inducible Clindamycin NEGATIVE Sensitive     * RARE METHICILLIN RESISTANT STAPHYLOCOCCUS AUREUS      Radiology Studies: DG THORACOLUMABAR SPINE  Result Date: 10/15/2020 CLINICAL DATA:  Elective surgery. Additional history provided: T11-12 laminectomy. Provided fluoroscopy time 5 seconds (5.32 mGy). EXAM: THORACOLUMBAR SPINE 1V COMPARISON:  Thoracic spine MRI 10/14/2020. CT chest/abdomen/pelvis 10/06/2020. FINDINGS: Two AP view intraoperative fluoroscopic images of the lower thoracic spine are submitted. On the initial provided image taken at 2:04 p.m., a metallic surgical instrument projects over the lower thoracic spine at the T12 level. On the subsequent image provided at 2:31 p.m., there is sequela of interval T11-T12 laminectomy. Curvilinear hyperdensity projects over the laminectomy bed, likely reflecting a surgical sponge/packing material. A metallic surgical instrument also projects over the thoracic spine at this level. Overlying retractors. IMPRESSION: Two intraoperative fluoroscopic images of the  thoracic spine from reported T11-T12 laminectomy, as described. On the most recent provided image, curvilinear hyperdensity projects over the T11-T12 laminectomy bed, likely reflecting a surgical sponge/packing material. Correlate with the operative history. Electronically Signed   By: Jackey Loge DO   On: 10/15/2020 16:38   DG C-Arm 1-60 Min  Result Date: 10/15/2020 CLINICAL DATA:  Elective surgery. Additional history provided: T11-12 laminectomy. Provided fluoroscopy time 5 seconds (5.32 mGy). EXAM: THORACOLUMBAR SPINE 1V COMPARISON:  Thoracic spine MRI 10/14/2020. CT chest/abdomen/pelvis 10/06/2020. FINDINGS: Two AP view intraoperative fluoroscopic images of the lower thoracic spine are submitted. On the initial provided image taken at 2:04 p.m., a metallic surgical instrument projects over the lower thoracic spine at the T12 level. On the subsequent image provided at 2:31 p.m., there is sequela of interval T11-T12 laminectomy. Curvilinear hyperdensity projects over the laminectomy bed, likely reflecting a surgical sponge/packing material. A metallic surgical instrument also projects over the thoracic spine at this level. Overlying retractors. IMPRESSION: Two intraoperative fluoroscopic images of the thoracic spine from reported T11-T12 laminectomy, as described. On the most recent provided image, curvilinear hyperdensity projects over the T11-T12 laminectomy bed, likely reflecting a surgical sponge/packing material. Correlate with the operative history. Electronically Signed   By: Jackey Loge DO   On: 10/15/2020 16:38    Scheduled Meds: . amLODipine  5 mg Oral Daily  . buprenorphine-naloxone  1 tablet Sublingual BID  . Chlorhexidine Gluconate Cloth  6 each Topical Daily  . docusate sodium  100 mg Oral BID  . mouth rinse  15 mL Mouth Rinse BID  . nicotine  14 mg Transdermal Daily  . pantoprazole (PROTONIX) IV  40 mg Intravenous QHS  . polyethylene glycol  17 g Oral Daily  . senna-docusate  2  tablet Oral QHS  . sodium chloride flush  10-40 mL Intracatheter Q12H  . sodium chloride flush  3 mL Intravenous Q12H  . sodium chloride flush  3 mL Intravenous Q12H   Continuous Infusions: . sodium chloride Stopped (10/16/20 1455)  . vancomycin Stopped (10/16/20 1412)     LOS: 10 days   Time spent: 30 minutes   Marcellus Scott, MD [facility]  10/17/2020, 12:38 PM   To contact the attending provider between 7A-7P or the covering provider during after hours 7P-7A, please log into the web site [email or web address].

## 2020-10-17 NOTE — Progress Notes (Signed)
PT Cancellation Note  Patient Details Name: Derek Woods MRN: [id number] DOB: [birth year]   Cancelled Treatment:    Reason Eval/Treat Not Completed: Patient declined, no reason specified Patient refused mobility this session stating "no I will do it tomorrow." Educated patient on importance of mobility and consequences of sedentary behaviors. Patient declined. Encouraged bed level exercises but patient declined. PT will re-attempt as time allows.   Darcey Demma A. Dan Humphreys PT, DPT Acute Rehabilitation Services Pager [phone] Office [phone]    [name] 10/17/2020, 3:18 PM

## 2020-10-17 NOTE — Progress Notes (Signed)
Pharmacy Antibiotic Note  Derek Woods is a 30 y.o. male admitted on 10/07/2020 with loculated pleural effusion with empyema now s/p VATS and thoracotomy. Also with T11-T12 discitis and osteomyelitis, epidural abscess s/p thoracic laminectomy. Pharmacy has been consulted for vancomycin dosing.  Renal function stable, Scr 0.8. Calculated AUC 575 (Cmax 51.4, Cmin 8.1). Even though AUC therapeutic on current dosing (2000 mg q24h), will adjust to q12h dosing to lower Cmax and increase Cmin to avoid risk of toxicities.   Plan: Adjust vancomycin to 1000mg  IV q12h (eAUC 575) Monitor renal function, levels as indicated   Height: 5\' 6"  (167.6 cm) Weight: (!) 146.7 kg (323 lb 6.4 oz) IBW/kg (Calculated) : 63.8  Temp (24hrs), Avg:98.8 F (37.1 C), Min:98.5 F (36.9 C), Max:99 F (37.2 C)  Recent Labs  Lab 10/11/20 0435 10/11/20 1218 10/12/20 0435 10/13/20 0339 10/14/20 0331 10/15/20 0353 10/16/20 0422 10/16/20 1545 10/17/20 0500 10/17/20 1215  WBC 6.4  --  6.0 6.2 7.2 6.1 8.6  --  6.7  --   CREATININE 0.90  --  0.81 0.76  --  0.82  --   --  0.80  --   VANCOTROUGH  --  30*  --   --   --   --   --   --   --  8*  VANCOPEAK  --   --   --   --   --   --   --  45*  --   --   VANCORANDOM  --   --  11  --   --   --   --   --   --   --     Estimated Creatinine Clearance: 183.6 mL/min (by C-G formula based on SCr of 0.8 mg/dL).    Allergies  Allergen Reactions  . Augmentin [Amoxicillin-Pot Clavulanate] Other (See Comments)    Pt does not recall, childhood  . Ceclor [Cefaclor] Other (See Comments)    Pt does not recall, childhood  . Sulfa Antibiotics Hives    Antimicrobials this admission: 4/24 vancomycin >> 4/24 cefepime >> 4/28  Dose adjustments this admission: 4/28 Vanc random 30 - hold vancomycin 4/29 Vanc random 11 - start 2000mg  IV q24h 5/4 AUC 575 on 2000mg  IV q24 > 1000mg  IV q12h (eAUC 575)  Microbiology results: 4/24 BCx: ngtd (collected prior to abx) 4/25 MRSA PCR +   4/26 pleural tissue: staph aureus > MRSA 4/26 pleural fluid: staph aureus > MRSA 5/2 epidural abscess: MRSA  , PharmD PGY1 Pharmacy Resident 10/17/2020 3:54 PM  Please check [email or web address] for unit-specific pharmacy phone numbers.

## 2020-10-17 NOTE — Progress Notes (Signed)
Patient refused MRI tonight, wants to wait until morning.

## 2020-10-18 LAB — CBC WITH DIFFERENTIAL/PLATELET
Abs Immature Granulocytes: 0.03 10*3/uL (ref 0.00–0.07)
Basophils Absolute: 0.1 10*3/uL (ref 0.0–0.1)
Basophils Relative: 1 %
Eosinophils Absolute: 0.2 10*3/uL (ref 0.0–0.5)
Eosinophils Relative: 3 %
HCT: 30.7 % — ABNORMAL LOW (ref 39.0–52.0)
Hemoglobin: 9.8 g/dL — ABNORMAL LOW (ref 13.0–17.0)
Immature Granulocytes: 0 %
Lymphocytes Relative: 27 %
Lymphs Abs: 1.9 10*3/uL (ref 0.7–4.0)
MCH: 30.4 pg (ref 26.0–34.0)
MCHC: 31.9 g/dL (ref 30.0–36.0)
MCV: 95.3 fL (ref 80.0–100.0)
Monocytes Absolute: 0.2 10*3/uL (ref 0.1–1.0)
Monocytes Relative: 3 %
Neutro Abs: 4.7 10*3/uL (ref 1.7–7.7)
Neutrophils Relative %: 66 %
Platelets: 295 10*3/uL (ref 150–400)
RBC: 3.22 MIL/uL — ABNORMAL LOW (ref 4.22–5.81)
RDW: 14.9 % (ref 11.5–15.5)
WBC: 7.1 10*3/uL (ref 4.0–10.5)
nRBC: 0 % (ref 0.0–0.2)

## 2020-10-18 MED ORDER — PANTOPRAZOLE SODIUM 40 MG PO TBEC
40.0000 mg | DELAYED_RELEASE_TABLET | Freq: Every day | ORAL | Status: DC
  Administered 2020-10-18 – 2020-11-19 (×33): 40 mg via ORAL
  Filled 2020-10-18 (×33): qty 1

## 2020-10-18 MED ORDER — ACETAMINOPHEN 500 MG PO TABS
1000.0000 mg | ORAL_TABLET | Freq: Three times a day (TID) | ORAL | Status: DC
  Administered 2020-10-18 – 2020-11-27 (×121): 1000 mg via ORAL
  Filled 2020-10-18 (×122): qty 2

## 2020-10-18 NOTE — Progress Notes (Addendum)
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  MRI lumbar spine with no evidence of discitis and osteomyelitis in lumbar region   Assessment MRSA pneumonia/empyema status post right VATS/thoracotomy 4/26, or cultures with MRSA  T11/T12 discitis and osteomyelitis, epidural abscess status post thoracic laminectomy on 5/2 OR note " There was no frank liquid pus identified there was an extensive mount of phlegmon causing severe cord compression" or cultures growing MRSA  Therapeutic drug monitoring: vancomycin trough 8, pharamcy to readjust vancomycin dose   IVDU  Smoking HCV antibody reactive  Recommendations -Continue vancomycin, pharmacy to dose. Avoiding Daptomycin given lung involvement  -Recommend at least 6 weeks of IV antibiotics from date of source control on 5/2 given severity of infection involving lung and spine. He is agreeable to complete 6 weeks of IV antibiotics. Tentative end date 11/27/20.  - If he changes his mind or decides to leave AMA, will plan for alternative options like Oritavancin at that time.   - Call us back when he is approaching end date of antibiotics for re-evaluation for need to transition to PO antibiotics +/- re-imaging  PICC  line in place Fu HCV RNA. A follow up with [facility] will be made for HCV tx OP ( 6/21 at 8:45 am with Myself)  Monitor CBC BMP and vancomycin trough. ESR and CRP once every 2 weeks  I will sign off for now.  Rest of the management as per the primary team. Thank you for the consult. Please page with pertinent questions or concerns.  ______________________________________________________________________ Subjective patient seen and examined at the bedside. Lying in  Bed. Complains of soreness in the back, same as yesterday. No new concerns.    Vitals BP (!) 143/94 (BP Location: Left Arm)   Pulse 95   Temp 98.6 F (37 C) (Oral)   Resp 19   Ht '5\' 6"'  (1.676 m)   Wt (!) 146.7 kg   SpO2 95%   BMI 52.20 kg/m      Physical Exam Constitutional:   Lying in bed lying in bed, no acute distress, obese    Comments:   Cardiovascular:     Rate and Rhythm: Normal rate and regular rhythm.     Heart sounds: No murmur heard.   Pulmonary:     Effort: Pulmonary effort is normal.     Comments: Clear air sounds Abdominal:     Palpations: Abdomen is soft.     Tenderness: Obese and nontender  Musculoskeletal:        General: No swelling or tenderness.   Skin:    Comments: No obvious lesions or rashes, right arm PICC line-no erythema and non tenderness  Neurological:     General: No focal deficit present.   Psychiatric:        Mood and Affect: Mood normal.    Pertinent labs  CBC Latest Ref Rng & Units 10/18/2020 10/17/2020 10/16/2020  WBC 4.0 -  10.5 K/uL 7.1 6.7 8.6  Hemoglobin 13.0 - 17.0 g/dL 9.8(L) 9.5(L) 9.5(L)  Hematocrit 39.0 - 52.0 % 30.7(L) 30.1(L) 29.5(L)  Platelets 150 - 400 K/uL 295 305 322   CMP Latest Ref Rng & Units 10/17/2020 10/15/2020 10/13/2020  Glucose 70 - 99 mg/dL 135(H) 109(H) 123(H)  BUN 6 - 20 mg/dL '20 18 19  ' Creatinine 0.61 - 1.24 mg/dL 0.80 0.82 0.76  Sodium 135 - 145 mmol/L 137 136 134(L)  Potassium 3.5 - 5.1 mmol/L 4.2 4.7 4.3  Chloride 98 - 111 mmol/L 105 100 100   CO2 22 - 32 mmol/L '29 30 28  ' Calcium 8.9 - 10.3 mg/dL 8.6(L) 8.7(L) 8.7(L)  Total Protein 6.5 - 8.1 g/dL - - 6.4(L)  Total Bilirubin 0.3 - 1.2 mg/dL - - 0.6  Alkaline Phos 38 - 126 U/L - - 107  AST 15 - 41 U/L - - 32  ALT 0 - 44 U/L - - 20    Pertinent Micro Results for orders placed or performed during the hospital encounter of 10/07/20  Culture, blood (routine x 2)     Status: None   Collection Time: 10/07/20 11:01 AM   Specimen: BLOOD LEFT HAND  Result Value Ref Range Status   Specimen Description BLOOD LEFT HAND  Final   Special Requests   Final    BOTTLES DRAWN AEROBIC ONLY Blood Culture results may not be optimal due to an inadequate volume of blood received in culture bottles   Culture   Final    NO GROWTH 5 DAYS Performed at [facility], [place] [address], [place], [place] [address]    Report Status 10/12/2020 FINAL  Final  Culture, blood (routine x 2)     Status: None   Collection Time: 10/07/20 11:14 AM   Specimen: BLOOD RIGHT HAND  Result Value Ref Range Status   Specimen Description BLOOD RIGHT HAND  Final   Special Requests   Final    BOTTLES DRAWN AEROBIC ONLY Blood Culture results may not be optimal due to an inadequate volume of blood received in culture bottles   Culture   Final    NO GROWTH 5 DAYS Performed at [facility], [place] [address] [place], [place] [address]    Report Status 10/12/2020 FINAL  Final  Surgical PCR screen     Status: Abnormal   Collection Time: 10/08/20  8:58 PM   Specimen: Nasal Mucosa; Nasal Swab  Result Value Ref Range Status   MRSA, PCR POSITIVE (A) NEGATIVE Final    Comment: RESULT CALLED TO, READ BACK BY AND VERIFIED WITH: O.P. RN 10/09/20 0015 JDW    Staphylococcus aureus POSITIVE (A) NEGATIVE Final    Comment: (NOTE) The Xpert SA Assay (FDA approved for NASAL specimens in patients 68 years of age and older), is one component of a comprehensive surveillance program. It is not intended to diagnose infection nor  to guide or monitor treatment. Performed at [facility], [place] [address], [place], [place] [address]   Fungus Culture With Stain     Status: None (Preliminary result)   Collection Time: 10/09/20  3:46 PM   Specimen: PATH Cytology Pleural fluid; Body Fluid  Result Value Ref Range Status   Fungus Stain Final report  Final    Comment: (NOTE) Performed At: [facility] [place], [place] [id number] Rush Farmer MD [id number]    Fungus (Mycology) Culture PENDING  Incomplete   Fungal Source FLUID  Final  Comment: PLEURAL RIGHT Performed at [facility], [place] [address], [place], [place] [address]   Aerobic/Anaerobic Culture w Gram Stain (surgical/deep wound)     Status: None   Collection Time: 10/09/20  3:46 PM   Specimen: PATH Cytology Pleural fluid; Body Fluid  Result Value Ref Range Status   Specimen Description FLUID PLEURAL RIGHT  Final   Special Requests NONE  Final   Gram Stain   Final    RARE WBC PRESENT,BOTH PMN AND MONONUCLEAR FEW GRAM POSITIVE COCCI IN PAIRS IN CLUSTERS    Culture   Final    ABUNDANT METHICILLIN RESISTANT STAPHYLOCOCCUS AUREUS NO ANAEROBES ISOLATED Performed at [facility], [place] [address], [place], [place] [address]    Report Status 10/15/2020 FINAL  Final   Organism ID, Bacteria METHICILLIN RESISTANT STAPHYLOCOCCUS AUREUS  Final      Susceptibility   Methicillin resistant staphylococcus aureus - MIC*    CIPROFLOXACIN >=8 RESISTANT Resistant     ERYTHROMYCIN >=8 RESISTANT Resistant     GENTAMICIN <=0.5 SENSITIVE Sensitive     OXACILLIN >=4 RESISTANT Resistant     TETRACYCLINE >=16 RESISTANT Resistant     VANCOMYCIN <=0.5 SENSITIVE Sensitive     TRIMETH/SULFA <=10 SENSITIVE Sensitive     CLINDAMYCIN <=0.25 SENSITIVE Sensitive     RIFAMPIN <=0.5 SENSITIVE Sensitive     Inducible Clindamycin NEGATIVE Sensitive     * ABUNDANT METHICILLIN RESISTANT STAPHYLOCOCCUS AUREUS  Acid Fast Smear (AFB)     Status:  None   Collection Time: 10/09/20  3:46 PM   Specimen: PATH Cytology Pleural fluid; Body Fluid  Result Value Ref Range Status   AFB Specimen Processing Concentration  Final   Acid Fast Smear Negative  Final    Comment: (NOTE) Performed At: [facility] [place], [place] [id number] Rush Farmer MD [id number]    Source (AFB) FLUID  Final    Comment: PLEURAL RIGHT Performed at [facility], [place] [address], [place], [place] [address]   Fungus Culture Result     Status: None   Collection Time: 10/09/20  3:46 PM  Result Value Ref Range Status   Result 1 Comment  Final    Comment: (NOTE) KOH/Calcofluor preparation:  no fungus observed. Performed At: [facility] [address] [place], [place] [id number] Rush Farmer MD [id number]   Fungus Culture With Stain     Status: None (Preliminary result)   Collection Time: 10/09/20  3:52 PM   Specimen: Soft Tissue, Other  Result Value Ref Range Status   Fungus Stain Final report  Final    Comment: (NOTE) Performed At: [facility] [place], [place] [id number] Rush Farmer MD XQ:[id number]    Fungus (Mycology) Culture PENDING  Incomplete   Fungal Source TISSUE  Final    Comment: PLEURAL PEEL Performed at [facility], [place] [address], [place], [place] [address]   Aerobic/Anaerobic Culture w Gram Stain (surgical/deep wound)     Status: None   Collection Time: 10/09/20  3:52 PM   Specimen: Soft Tissue, Other  Result Value Ref Range Status   Specimen Description TISSUE  Final   Special Requests PLEURAL PEEL  Final   Gram Stain   Final    RARE WBC PRESENT,BOTH PMN AND MONONUCLEAR RARE GRAM POSITIVE COCCI IN CLUSTERS    Culture   Final    MODERATE METHICILLIN RESISTANT STAPHYLOCOCCUS AUREUS NO ANAEROBES ISOLATED Performed at [facility], [place]  [address], [place], [place] [address]    Report Status 10/15/2020 FINAL  Final   Organism ID, Bacteria  METHICILLIN RESISTANT STAPHYLOCOCCUS AUREUS  Final      Susceptibility   Methicillin resistant staphylococcus aureus - MIC*    CIPROFLOXACIN >=8 RESISTANT Resistant     ERYTHROMYCIN >=8 RESISTANT Resistant     GENTAMICIN <=0.5 SENSITIVE Sensitive     OXACILLIN >=4 RESISTANT Resistant     TETRACYCLINE >=16 RESISTANT Resistant     VANCOMYCIN <=0.5 SENSITIVE Sensitive     TRIMETH/SULFA <=10 SENSITIVE Sensitive     CLINDAMYCIN <=0.25 SENSITIVE Sensitive     RIFAMPIN <=0.5 SENSITIVE Sensitive     Inducible Clindamycin NEGATIVE Sensitive     * MODERATE METHICILLIN RESISTANT STAPHYLOCOCCUS AUREUS  Acid Fast Smear (AFB)     Status: None   Collection Time: 10/09/20  3:52 PM   Specimen: Soft Tissue, Other  Result Value Ref Range Status   AFB Specimen Processing Comment  Final    Comment: Tissue Grinding and Digestion/Decontamination   Acid Fast Smear Negative  Final    Comment: (NOTE) Performed At: [facility] [employer] [place], [place] [id number] Rush Farmer MD GQ:[id number]    Source (AFB) TISSUE  Final    Comment: PLEURAL PEEL Performed at [facility], [address] [place], [place] [address]   Fungus Culture Result     Status: None   Collection Time: 10/09/20  3:52 PM  Result Value Ref Range Status   Result 1 Comment  Final    Comment: (NOTE) KOH/Calcofluor preparation:  no fungus observed. Performed At: [facility] [place], [place] [id number] Rush Farmer MD VW:[id number]   Aerobic/Anaerobic Culture w Gram Stain (surgical/deep wound)     Status: None (Preliminary result)   Collection Time: 10/15/20  3:01 PM   Specimen: PATH Other; Tissue  Result Value Ref Range Status   Specimen Description ABSCESS  Final   Special Requests T11 12 EPIDURAL PATIENT ON FOLLOWING VANC  Final   Gram Stain   Final    NO WBC SEEN NO ORGANISMS SEEN Performed at [facility], [place] [address], [place], [place] [address]    Culture   Final     RARE METHICILLIN RESISTANT STAPHYLOCOCCUS AUREUS NO ANAEROBES ISOLATED; CULTURE IN PROGRESS FOR 5 DAYS    Report Status PENDING  Incomplete   Organism ID, Bacteria METHICILLIN RESISTANT STAPHYLOCOCCUS AUREUS  Final      Susceptibility   Methicillin resistant staphylococcus aureus - MIC*    CIPROFLOXACIN >=8 RESISTANT Resistant     ERYTHROMYCIN >=8 RESISTANT Resistant     GENTAMICIN <=0.5 SENSITIVE Sensitive     OXACILLIN >=4 RESISTANT Resistant     TETRACYCLINE >=16 RESISTANT Resistant     VANCOMYCIN <=0.5 SENSITIVE Sensitive     TRIMETH/SULFA <=10 SENSITIVE Sensitive     CLINDAMYCIN <=0.25 SENSITIVE Sensitive     RIFAMPIN <=0.5 SENSITIVE Sensitive     Inducible Clindamycin NEGATIVE Sensitive     * RARE METHICILLIN RESISTANT STAPHYLOCOCCUS AUREUS     Pertinent Imaging today Plain films and CT images have been personally visualized and interpreted; radiology reports have been reviewed. Decision making incorporated into the Impression / Recommendations.  MRI Lumbar spine 10/18/42 FINDINGS: Segmentation: Transitional lumbosacral anatomy. As correlated with prior imaging, there is a transitional, partially lumbarized S1 segment.  Alignment:  Physiologic  Vertebrae: Changes of diskitis and osteomyelitis at T11-12 are incompletely visualized. No evidence of disc space infection or osteomyelitis in  the lumbar spine. There are chronic endplate degenerative changes at L5-S1 which are asymmetric to the right. Diffusely decreased T1 marrow signal again noted. There are degenerative changes at the sacroiliac joints. No evidence of sacroiliitis.  Conus medullaris and cauda equina: Conus extends to the L1-2 level. Conus and cauda equina appear normal.  Paraspinal and other soft tissues: No significant lumbar paraspinal abnormalities. T2 hyperintensity and enhancement within the posterior soft tissues of the lower thoracic spine attributed to recent surgery.  Disc  levels:  No significant disc space findings from L1-2 through L3-4.  L4-5: Loss of disc height with annular disc bulging and a shallow left paracentral disc protrusion. Mild facet and ligamentous hypertrophy. There is mild narrowing of the left lateral recess with possible left L5 nerve root encroachment. Both foramina are patent.  L5-S1: Chronic degenerative disc disease with loss of disc height, annular disc bulging and endplate osteophytes asymmetric to the right. Mild facet and ligamentous hypertrophy. These factors contribute to mild spinal stenosis and mild lateral recess narrowing bilaterally. There is severe osseous foraminal narrowing on the right at L5-S1.  S1-2: Transitional disc space level demonstrates no acquired abnormality.  IMPRESSION: 1. Partially imaged findings of discitis/osteomyelitis at T11-12 status post recent laminectomy. 2. No evidence of discitis or osteomyelitis in the lumbar spine. 3. Shallow left paracentral disc protrusion at L4-5 contributing to left lateral recess narrowing and possible left L5 nerve root encroachment. 4. Chronic degenerative disc disease at L5-S1 with endplate osteophytes asymmetric to the right contributing to chronic right foraminal narrowing.  I have spent more than 35 minutes for this patient encounter including review of prior medical records, coordination of care  with greater than 50% of time being face to face/counseling and discussing diagnostics/treatment plan with the patient/family.  Electronically signed by:   Rosiland Oz, MD Infectious Disease Physician [facility] Pager: [phone]

## 2020-10-18 NOTE — Progress Notes (Signed)
Physical Therapy Treatment Patient Details Name: Derek Woods MRN: [id number] DOB: 25-Feb-1991 Today's Date: 10/18/2020    History of Present Illness [name] 30 yo male who presented on 10/07/20 with shortness of breath and found to have large R empyema.  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  Medical hx includes daily heroin IVDA (previous suboxone use), asthma, morbid obesity, OSA, and smokes.    PT Comments    Patient progressing towards physical therapy goals. Patient ambulated 22' with RW and minA. Reviewed back precautions with patient as he reports "no one told me those before." Encouraged patient to perform OOB mobility with assistance to improve mobility and endurance. Continue to recommend SNF for ongoing Physical Therapy.      Follow Up Recommendations  SNF     Equipment Recommendations  Rolling Tuleen Mandelbaum with 5" wheels;3in1 (PT)    Recommendations for Other Services       Precautions / Restrictions Precautions Precautions: Back Precaution Booklet Issued: No Precaution Comments: reviewed precautions with pt Restrictions Weight Bearing Restrictions: No    Mobility  Bed Mobility Overal bed mobility: Needs Assistance Bed Mobility: Rolling;Sidelying to Sit;Sit to Sidelying Rolling: Min guard Sidelying to sit: Min assist     Sit to sidelying: Min guard General bed mobility comments: min guard to roll and minA for trunk elevation. Increased time required due to pain    Transfers Overall transfer level: Needs assistance Equipment used: Rolling Antanette Richwine (2 wheeled) Transfers: Sit to/from Stand Sit to Stand: Min assist         General transfer comment: cues for hand placement. MinA to power up into standing  Ambulation/Gait Ambulation/Gait assistance: Min assist Gait  Distance (Feet): 40 Feet Assistive device: Rolling Amilcar Reever (2 wheeled) Gait Pattern/deviations: Step-to pattern;Trunk flexed;Decreased stride length Gait velocity: decreased   General Gait Details: ambulated 40' in room with RW and minA for balance and safety. Cues for upright posture and RW management   Stairs             Wheelchair Mobility    Modified Rankin (Stroke Patients Only)       Balance Overall balance assessment: Needs assistance Sitting-balance support: Bilateral upper extremity supported Sitting balance-Leahy Scale: Fair     Standing balance support: Bilateral upper extremity supported;During functional activity;No upper extremity supported Standing balance-Leahy Scale: Poor Standing balance comment: Reliant on UE support                            Cognition Arousal/Alertness: Awake/alert Behavior During Therapy: WFL for tasks assessed/performed Overall Cognitive Status: Within Functional Limits for tasks assessed                                        Exercises      General Comments        Pertinent Vitals/Pain Pain Assessment: Faces Faces Pain Scale: Hurts even more Pain Location: back Pain Descriptors / Indicators: Aching Pain Intervention(s): Monitored during session;Repositioned    Home Living                      Prior Function            PT  Goals (current goals can now be found in the care plan section) Acute Rehab PT Goals Patient Stated Goal: to get better PT Goal Formulation: With patient Time For Goal Achievement: 10/30/20 Potential to Achieve Goals: Good Progress towards PT goals: Progressing toward goals    Frequency    Min 5X/week      PT Plan Current plan remains appropriate    Co-evaluation              AM-PAC PT "6 Clicks" Mobility   Outcome Measure  Help needed turning from your back to your side while in a flat bed without using bedrails?: A Little Help needed  moving from lying on your back to sitting on the side of a flat bed without using bedrails?: A Little Help needed moving to and from a bed to a chair (including a wheelchair)?: A Little Help needed standing up from a chair using your arms (e.g., wheelchair or bedside chair)?: A Little Help needed to walk in hospital room?: A Little Help needed climbing 3-5 steps with a railing? : A Lot 6 Click Score: 17    End of Session Equipment Utilized During Treatment: Other (comment) (unable to use gait belt due to surgical site and obesity) Activity Tolerance: Patient limited by pain Patient left: in bed;with call bell/phone within reach;with bed alarm set Nurse Communication: Mobility status PT Visit Diagnosis: Other abnormalities of gait and mobility (R26.89);Muscle weakness (generalized) (M62.81);Pain Pain - part of body:  (back)     Time: 6378-5885 PT Time Calculation (min) (ACUTE ONLY): 36 min  Charges:  $Therapeutic Activity: 23-37 mins                     [place] Mielke A. Dan Humphreys PT, DPT Acute Rehabilitation Services Pager [phone] Office [phone]    Derek Woods 10/18/2020, 5:14 PM

## 2020-10-18 NOTE — Progress Notes (Signed)
Occupational Therapy Treatment Patient Details Name: Derek Woods MRN: [id number] DOB: 1991/05/25 Today's Date: 10/18/2020    History of present illness [name] 30 yo male who presented on 10/07/20 with shortness of breath and found to have large R empyema.  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  Medical hx includes daily heroin IVDA (previous suboxone use), asthma, morbid obesity, OSA, and smokes.   OT comments  Pt. Seen for increasing I and safety with ADLs and ADL transfers. Pt. Ed on use of AE for ADLs. Pt. Was issued a hip kit and toilet aid. Pt. Was ed on safety with bed mobility. Pt. Will need further OT and will be followed.   Follow Up Recommendations       Equipment Recommendations  3 in 1 bedside commode    Recommendations for Other Services      Precautions / Restrictions Precautions Precautions: Back Precaution Booklet Issued: No Precaution Comments: reviewed precautions with pt       Mobility Bed Mobility   Bed Mobility: Sit to Supine Rolling: Mod assist              Transfers Overall transfer level: Needs assistance     Sit to Stand: Min assist              Balance                                           ADL either performed or assessed with clinical judgement   ADL Overall ADL's : Needs assistance/impaired                     Lower Body Dressing: Minimal assistance (Pt. given hip kit for LE ADLs.)   Toilet Transfer: Minimal assistance   Toileting- Clothing Manipulation and Hygiene: Minimal assistance (Pt. given toilet aid)       Functional mobility during ADLs: Minimal assistance;Rolling walker;+2 for safety/equipment General ADL Comments: Pt. ed on use of AE. Pt. ed on back precautions.     Vision        Perception     Praxis      Cognition Arousal/Alertness: Awake/alert Behavior During Therapy: WFL for tasks assessed/performed Overall Cognitive Status: Within Functional Limits for tasks assessed                                          Exercises     Shoulder Instructions       General Comments      Pertinent Vitals/ Pain       Pain Assessment: 0-10 Pain Score: 7  Pain Location: back Pain Descriptors / Indicators: Aching Pain Intervention(s): RN gave pain meds during session  Home Living                                          Prior Functioning/Environment              Frequency  Min 2X/week        Progress  Toward Goals  OT Goals(current goals can now be found in the care plan section)  Progress towards OT goals: Progressing toward goals  Acute Rehab OT Goals Patient Stated Goal: to get better OT Goal Formulation: With patient Time For Goal Achievement: 10/30/20 Potential to Achieve Goals: Good ADL Goals Pt Will Perform Grooming: with modified independence;standing Pt Will Perform Lower Body Dressing: with modified independence;sit to/from stand;with adaptive equipment Pt Will Transfer to Toilet: with modified independence;ambulating;bedside commode Pt Will Perform Toileting - Clothing Manipulation and hygiene: with modified independence;sit to/from stand  Plan Discharge plan remains appropriate    Co-evaluation                 AM-PAC OT "6 Clicks" Daily Activity     Outcome Measure   Help from another person eating meals?: None Help from another person taking care of personal grooming?: A Little Help from another person toileting, which includes using toliet, bedpan, or urinal?: A Lot Help from another person bathing (including washing, rinsing, drying)?: A Lot Help from another person to put on and taking off regular upper body clothing?: A Little Help from another person to put on and taking off  regular lower body clothing?: A Lot 6 Click Score: 16    End of Session Equipment Utilized During Treatment: Rolling walker  OT Visit Diagnosis: Other abnormalities of gait and mobility (R26.89);Muscle weakness (generalized) (M62.81);Pain Pain - part of body:  (back)   Activity Tolerance Patient limited by pain   Patient Left in bed;with call bell/phone within reach;with bed alarm set   Nurse Communication  (ok therapy)        Time: 2300-9794 OT Time Calculation (min): 43 min  Charges: OT General Charges $OT Visit: 1 Visit OT Treatments $Self Care/Home Management : 38-52 mins  Reece Packer OT/L   Janeliz Prestwood 10/18/2020, 12:23 PM

## 2020-10-18 NOTE — Progress Notes (Signed)
PROGRESS NOTE    Derek Woods  QIO:[id number] DOB: 17-Feb-1991 DOA: 10/07/2020 PCP: Pcp, No   Brief Narrative:  Derek Woods is a 30 y.o. M, with a pertinent pmx of daily heroin IVDA (previous suboxone use), asthma, morbid obesity (BMI 52), 1 PPD smoking, possible OSA, presented to [facility] on 4/24 with complaints of back pain, right flank pain, and one week with shortness of breath . CT of his chest revealed a large right empyema. He was admitted to [facility] by the hospitalist team.  He was taken to the OR on 4/26 with Dr. Laneta Simmers for a VATS and drainage of right empyema, however a right thoracotomy had to be prefomed. He was a difficult intubation for the procedure and he was unable to be weaned from the ventilator post operatively. There was reported difficulty with oxygenation during the procedure. PCCM was consulted for ventilator management who assumed care as primary team.  Patient was extubated on 10/10/2020 and was transferred under [facility] care on 10/11/2020.  Posterior chest tube was removed on 10/12/2020.  Thoracotomy culture growing MRSA.  Antibiotics de-escalated to vancomycin.  ID consulted on 10/13/2020.  MRI thoracic spine was done which showed T11/T12 discitis and possible abscess. Status post decompressive thoracic laminectomy T11-12 by neurosurgery on 10/15/2020.  Has a Hemovac.  Lumbar MRI is still pending.  Hep C antibodies positive.  Assessment & Plan:   Principal Problem:   Empyema lung (HCC) Active Problems:   Polysubstance dependence including opioid type drug with complication, episodic abuse (HCC)   Morbid obesity with BMI of 50.0-59.9, adult (HCC)   Tobacco dependence   MRSA infection   Abscess in epidural space of thoracic spine  Acute respiratory failure with hypercarbia and hypoxia/right MRSA empyema s/p right thoracotomy on 10/09/2020/suspected OSA/OHS: Patient required intubation on 10/09/2020 and was extubated on 10/10/2020.  S/p thoracotomy as well as chest tube, one of them was  removed few days ago and the other one removed on 10/14/2020. Cultures growing MRSA.  He is on vancomycin which we will continue. Cardiothoracic surgery signed off.  Hypoxia resolved.  As per ID follow-up 5/5, recommend at least 6 weeks of IV antibiotics (vancomycin per pharmacy) from date of source control on 5/2 given severity of infection involving lung and spine.  Patient agreeable to complete 6 weeks of IV antibiotics.  Tentative end date: 11/27/2020.  ID indicates that if he changes his mind or decides to leave AMA, can consider alternative options like oritavancin at that time.  ID to be consulted again when he is approaching end date of antibiotics for reevaluation for transition to oral antibiotics +/- reimaging.  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  Hemovac discontinued 5/4.  Lumbar MRI shows no evidence of infection.  Neurosurgery signed off 5/5 and recommend outpatient follow-up in 2 weeks (patient however will still be in the hospital and they can be called back).  Antibiotic regimen per ID as noted above.  Multimodality pain control: Suboxone increased yesterday to 4 mg twice daily, changed Tylenol to 1 g 3 times daily, at patient's request-ibuprofen overnight was changed to IV Toradol as needed.  Advised patient regarding potential adverse effects of long-term NSAID use i.e. AKI, stomach ulcers etc.  Follow BMP closely and limit NSAID use to a couple days.  He verbalizes understanding.  Anxiety: Patient reports that hydroxyzine works better than Xanax, switched to same.  Reports doing well on  this regimen.  Essential hypertension: Blood pressure was elevated, amlodipine was started.  Reasonably controlled.  Continue that and as needed hydralazine.  Polysubstance abuse/IVDU: Continue Suboxone.  Avoid other opioids.  Normocytic anemia: Hemoglobin is stable over 9.   Monitor CBC periodically.  Tobacco dependence: Smoking cessation counseling provided.  Positive hepatitis C antibody: No known previous history of hepatitis C infection.  Antibodies positive.  ID will follow up HCV RNA and have made a follow-up appointment at [facility] for HCV treatment on 6/21 at 8:45 AM.  Body mass index is 52.2 kg/m./Morbid obesity.   DVT prophylaxis: SCD's Start: 10/15/20 1744 SCD's Start: 10/09/20 1716   Code Status: Full Code  Family Communication: None present at bedside.  Plan of care discussed with patient in length and he verbalized understanding and agreed with it.  Status is: Inpatient  Remains inpatient appropriate because:Inpatient level of care appropriate due to severity of illness   Dispo: The patient is from: Home              Anticipated d/c is to: SNF              Patient currently is not medically stable to d/c.   Difficult to place patient: Yes  As discussed with TOC team 10/18/2020, patient does not have insurance and given history of IVDU, need for prolonged IV antibiotics, will be difficult to place to SNF.  Advised to add to the difficult to place call list.     Consultants:   CT surgery  ID  Neurosurgery  Procedures:   Chest tube placement, thoracotomy and intubation  Right upper arm PICC line.  S/p decompressive thoracic laminectomy T11-12 on 10/15/2020  Antimicrobials:  Anti-infectives (From admission, onward)   Start     Dose/Rate Route Frequency Ordered Stop   10/18/20 1000  vancomycin (VANCOREADY) IVPB 1000 mg/200 mL        1,000 mg 200 mL/hr over 60 Minutes Intravenous Every 12 hours 10/17/20 1601     10/15/20 1830  ceFAZolin (ANCEF) IVPB 2g/100 mL premix        2 g 200 mL/hr over 30 Minutes Intravenous Every 8 hours 10/15/20 1744 10/16/20 0659   10/13/20 1200  vancomycin (VANCOREADY) IVPB 2000 mg/400 mL  Status:  Discontinued        2,000 mg 200 mL/hr over 120 Minutes Intravenous Every 24 hours 10/12/20 1115 10/17/20  1559   10/12/20 1215  vancomycin (VANCOREADY) IVPB 500 mg/100 mL        500 mg 100 mL/hr over 60 Minutes Intravenous  Once 10/12/20 1115 10/12/20 1329   10/12/20 0600  vancomycin (VANCOREADY) IVPB 1500 mg/300 mL  Status:  Discontinued        1,500 mg 150 mL/hr over 120 Minutes Intravenous Every 24 hours 10/12/20 0541 10/12/20 0556   10/12/20 0600  vancomycin (VANCOREADY) IVPB 1500 mg/300 mL  Status:  Discontinued        1,500 mg 150 mL/hr over 120 Minutes Intravenous Every 18 hours 10/12/20 0556 10/12/20 1115   10/11/20 1343  vancomycin variable dose per unstable renal function (pharmacist dosing)  Status:  Discontinued         Does not apply See admin instructions 10/11/20 1344 10/12/20 0556   10/10/20 0400  vancomycin (VANCOREADY) IVPB 1250 mg/250 mL  Status:  Discontinued        1,250 mg 166.7 mL/hr over 90 Minutes Intravenous Every 8 hours 10/09/20 1835 10/11/20 1344   10/09/20 2000  vancomycin (VANCOREADY) IVPB 1250  mg/250 mL  Status:  Discontinued        1,250 mg 166.7 mL/hr over 90 Minutes Intravenous Every 8 hours 10/09/20 1117 10/09/20 1835   10/09/20 1900  vancomycin (VANCOCIN) 2,500 mg in sodium chloride 0.9 % 500 mL IVPB        2,500 mg 250 mL/hr over 120 Minutes Intravenous  Once 10/09/20 1835 10/09/20 2137   10/09/20 1200  vancomycin (VANCOCIN) 2,500 mg in sodium chloride 0.9 % 500 mL IVPB  Status:  Discontinued        2,500 mg 250 mL/hr over 120 Minutes Intravenous  Once 10/09/20 1113 10/09/20 1835   10/07/20 2200  vancomycin (VANCOREADY) IVPB 2000 mg/400 mL  Status:  Discontinued        2,000 mg 200 mL/hr over 120 Minutes Intravenous Every 12 hours 10/07/20 1251 10/07/20 1437   10/07/20 2000  ceFEPIme (MAXIPIME) 2 g in sodium chloride 0.9 % 100 mL IVPB  Status:  Discontinued        2 g 200 mL/hr over 30 Minutes Intravenous Every 8 hours 10/07/20 1251 10/11/20 1342   10/07/20 1145  ceFEPIme (MAXIPIME) 2 g in sodium chloride 0.9 % 100 mL IVPB        2 g 200 mL/hr over 30  Minutes Intravenous  Once 10/07/20 1048 10/07/20 1351   10/07/20 1100  vancomycin (VANCOCIN) 2,500 mg in sodium chloride 0.9 % 500 mL IVPB  Status:  Discontinued        2,500 mg 250 mL/hr over 120 Minutes Intravenous  Once 10/07/20 1048 10/07/20 1437         Subjective: Overnight events noted, IV Toradol substituted for Motrin.  Reports having BM.  Still struggling with pain but Toradol seems to be working better.  Objective: Vitals:   10/18/20 0028 10/18/20 0436 10/18/20 0855 10/18/20 1107  BP: (!) 143/80 (!) 143/94 (!) 154/98 (!) 160/94  Pulse: 87 95 93 (!) 107  Resp: 18 19 18 18   Temp: 97.8 F (36.6 C) 98.6 F (37 C) 98.4 F (36.9 C) 98.8 F (37.1 C)  TempSrc: Oral Oral Oral Oral  SpO2: 96% 95% 95% 92%  Weight:      Height:        Intake/Output Summary (Last 24 hours) at 10/18/2020 1433 Last data filed at 10/18/2020 0500 Gross per 24 hour  Intake --  Output 1200 ml  Net -1200 ml   Filed Weights   10/07/20 1006  Weight: (!) 146.7 kg    Examination:  General exam: Pleasant young male, moderately built and morbidly obese lying comfortably propped up in bed without distress. Respiratory system: Clear to auscultation.  No increased work of breathing. Cardiovascular system: S1 and S2 heard, RRR.  No JVD, murmurs or pedal edema.  Telemetry personally reviewed: SR-ST in the low 100s. Gastrointestinal system: Abdomen is nondistended, soft and nontender. No organomegaly or masses felt. Normal bowel sounds heard. Central nervous system: Alert and oriented. No focal neurological deficits. Extremities: Symmetric 5 x 5 power in all extremities. Skin: No rashes, lesions or ulcers Psychiatry: Judgement and insight appear normal. Mood & affect appropriate.      Data Reviewed: I have personally reviewed following labs and imaging studies  CBC: Recent Labs  Lab 10/14/20 0331 10/15/20 0353 10/16/20 0422 10/17/20 0500 10/18/20 0500  WBC 7.2 6.1 8.6 6.7 7.1  NEUTROABS 4.8  3.5 6.9 4.0 4.7  HGB 9.7* 9.3* 9.5* 9.5* 9.8*  HCT 30.7* 29.1* 29.5* 30.1* 30.7*  MCV 94.5 93.9 92.5  94.7 95.3  PLT 312 269 322 305 295   Basic Metabolic Panel: Recent Labs  Lab 10/12/20 0435 10/13/20 0339 10/15/20 0353 10/17/20 0500  NA 133* 134* 136 137  K 4.0 4.3 4.7 4.2  CL 100 100 100 105  CO2 GLUCOSE 120* 123* 109* 135*  BUN CREATININE 0.81 0.76 0.82 0.80  CALCIUM 8.6* 8.7* 8.7* 8.6*   GFR: Estimated Creatinine Clearance: 183.6 mL/min (by C-G formula based on SCr of 0.8 mg/dL). Liver Function Tests: Recent Labs  Lab 10/12/20 0435 10/13/20 0339  AST 27 32  ALT 17 20  ALKPHOS 103 107  BILITOT 0.5 0.6  PROT 6.7 6.4*  ALBUMIN 2.2* 2.1*   Coagulation Profile: Recent Labs  Lab 10/14/20 0901  INR 1.0   CBG: Recent Labs  Lab 10/11/20 1559  GLUCAP 128*     Recent Results (from the past 240 hour(s))  Surgical PCR screen     Status: Abnormal   Collection Time: 10/08/20  8:58 PM   Specimen: Nasal Mucosa; Nasal Swab  Result Value Ref Range Status   MRSA, PCR POSITIVE (A) NEGATIVE Final    Comment: RESULT CALLED TO, READ BACK BY AND VERIFIED WITH: O.P. RN 10/09/20 0015 JDW    Staphylococcus aureus POSITIVE (A) NEGATIVE Final    Comment: (NOTE) The Xpert SA Assay (FDA approved for NASAL specimens in patients 17 years of age and older), is one component of a comprehensive surveillance program. It is not intended to diagnose infection nor to guide or monitor treatment. Performed at [facility], [address] [place], [place] [address]   Fungus Culture With Stain     Status: None (Preliminary result)   Collection Time: 10/09/20  3:46 PM   Specimen: PATH Cytology Pleural fluid; Body Fluid  Result Value Ref Range Status   Fungus Stain Final report  Final    Comment: (NOTE) Performed At: [facility] [address] [place], [place] [id number] Jolene Schimke MD [id number]:[id number]    Fungus (Mycology) Culture PENDING   Incomplete   Fungal Source FLUID  Final    Comment: PLEURAL RIGHT Performed at [facility], [address] [place], [place] [address]   Aerobic/Anaerobic Culture w Gram Stain (surgical/deep wound)     Status: None   Collection Time: 10/09/20  3:46 PM   Specimen: PATH Cytology Pleural fluid; Body Fluid  Result Value Ref Range Status   Specimen Description FLUID PLEURAL RIGHT  Final   Special Requests NONE  Final   Gram Stain   Final    RARE WBC PRESENT,BOTH PMN AND MONONUCLEAR FEW GRAM POSITIVE COCCI IN PAIRS IN CLUSTERS    Culture   Final    ABUNDANT METHICILLIN RESISTANT STAPHYLOCOCCUS AUREUS NO ANAEROBES ISOLATED Performed at [facility], [address] [place], [place] [address]    Report Status 10/15/2020 FINAL  Final   Organism ID, Bacteria METHICILLIN RESISTANT STAPHYLOCOCCUS AUREUS  Final      Susceptibility   Methicillin resistant staphylococcus aureus - MIC*    CIPROFLOXACIN >=8 RESISTANT Resistant     ERYTHROMYCIN >=8 RESISTANT Resistant     GENTAMICIN <=0.5 SENSITIVE Sensitive     OXACILLIN >=4 RESISTANT Resistant     TETRACYCLINE >=16 RESISTANT Resistant     VANCOMYCIN <=0.5 SENSITIVE Sensitive     TRIMETH/SULFA <=10 SENSITIVE Sensitive     CLINDAMYCIN <=0.25 SENSITIVE Sensitive     RIFAMPIN <=0.5 SENSITIVE Sensitive  Inducible Clindamycin NEGATIVE Sensitive     * ABUNDANT METHICILLIN RESISTANT STAPHYLOCOCCUS AUREUS  Acid Fast Smear (AFB)     Status: None   Collection Time: 10/09/20  3:46 PM   Specimen: PATH Cytology Pleural fluid; Body Fluid  Result Value Ref Range Status   AFB Specimen Processing Concentration  Final   Acid Fast Smear Negative  Final    Comment: (NOTE) Performed At: [facility] [address] [place], [place] [id number] Jolene Schimke MD DJ:[id number]    Source (AFB) FLUID  Final    Comment: PLEURAL RIGHT Performed at [facility], [address] [place], [place] [address]   Fungus Culture Result      Status: None   Collection Time: 10/09/20  3:46 PM  Result Value Ref Range Status   Result 1 Comment  Final    Comment: (NOTE) KOH/Calcofluor preparation:  no fungus observed. Performed At: [facility] [address] [place], [place] [id number] Jolene Schimke MD VE:[id number]   Fungus Culture With Stain     Status: None (Preliminary result)   Collection Time: 10/09/20  3:52 PM   Specimen: Soft Tissue, Other  Result Value Ref Range Status   Fungus Stain Final report  Final    Comment: (NOTE) Performed At: [facility] [address] [place], [place] [id number] Jolene Schimke MD LY:[id number]    Fungus (Mycology) Culture PENDING  Incomplete   Fungal Source TISSUE  Final    Comment: PLEURAL PEEL Performed at [facility], [address], [place], [place] [address]   Aerobic/Anaerobic Culture w Gram Stain (surgical/deep wound)     Status: None   Collection Time: 10/09/20  3:52 PM   Specimen: Soft Tissue, Other  Result Value Ref Range Status   Specimen Description TISSUE  Final   Special Requests PLEURAL PEEL  Final   Gram Stain   Final    RARE WBC PRESENT,BOTH PMN AND MONONUCLEAR RARE GRAM POSITIVE COCCI IN CLUSTERS    Culture   Final    MODERATE METHICILLIN RESISTANT STAPHYLOCOCCUS AUREUS NO ANAEROBES ISOLATED Performed at [facility], [address] [place], [place] [address]    Report Status 10/15/2020 FINAL  Final   Organism ID, Bacteria METHICILLIN RESISTANT STAPHYLOCOCCUS AUREUS  Final      Susceptibility   Methicillin resistant staphylococcus aureus - MIC*    CIPROFLOXACIN >=8 RESISTANT Resistant     ERYTHROMYCIN >=8 RESISTANT Resistant     GENTAMICIN <=0.5 SENSITIVE Sensitive     OXACILLIN >=4 RESISTANT Resistant     TETRACYCLINE >=16 RESISTANT Resistant     VANCOMYCIN <=0.5 SENSITIVE Sensitive     TRIMETH/SULFA <=10 SENSITIVE Sensitive     CLINDAMYCIN <=0.25 SENSITIVE Sensitive     RIFAMPIN <=0.5 SENSITIVE Sensitive      Inducible Clindamycin NEGATIVE Sensitive     * MODERATE METHICILLIN RESISTANT STAPHYLOCOCCUS AUREUS  Acid Fast Smear (AFB)     Status: None   Collection Time: 10/09/20  3:52 PM   Specimen: Soft Tissue, Other  Result Value Ref Range Status   AFB Specimen Processing Comment  Final    Comment: Tissue Grinding and Digestion/Decontamination   Acid Fast Smear Negative  Final    Comment: (NOTE) Performed At: [facility] [employer] [address] [place], [place] [id number] Jolene Schimke MD [id number]    Source (AFB) TISSUE  Final    Comment: PLEURAL PEEL Performed at [facility], [address] [place], [place] [address]   Fungus Culture  Result     Status: None   Collection Time: 10/09/20  3:52 PM  Result Value Ref Range Status   Result 1 Comment  Final    Comment: (NOTE) KOH/Calcofluor preparation:  no fungus observed. Performed At: [facility] [address] [place], [place] [id number] Jolene SchimkeNagendra Sanjai MD NF:[id number]:[phone]   Aerobic/Anaerobic Culture w Gram Stain (surgical/deep wound)     Status: None (Preliminary result)   Collection Time: 10/15/20  3:01 PM   Specimen: PATH Other; Tissue  Result Value Ref Range Status   Specimen Description ABSCESS  Final   Special Requests T11 12 EPIDURAL PATIENT ON FOLLOWING VANC  Final   Gram Stain   Final    NO WBC SEEN NO ORGANISMS SEEN Performed at [facility], [address] [place], [place] [id number]    Culture   Final    RARE METHICILLIN RESISTANT STAPHYLOCOCCUS AUREUS NO ANAEROBES ISOLATED; CULTURE IN PROGRESS FOR 5 DAYS    Report Status PENDING  Incomplete   Organism ID, Bacteria METHICILLIN RESISTANT STAPHYLOCOCCUS AUREUS  Final      Susceptibility   Methicillin resistant staphylococcus aureus - MIC*    CIPROFLOXACIN >=8 RESISTANT Resistant     ERYTHROMYCIN >=8 RESISTANT Resistant     GENTAMICIN <=0.5 SENSITIVE Sensitive     OXACILLIN >=4 RESISTANT Resistant     TETRACYCLINE >=16 RESISTANT Resistant      VANCOMYCIN <=0.5 SENSITIVE Sensitive     TRIMETH/SULFA <=10 SENSITIVE Sensitive     CLINDAMYCIN <=0.25 SENSITIVE Sensitive     RIFAMPIN <=0.5 SENSITIVE Sensitive     Inducible Clindamycin NEGATIVE Sensitive     * RARE METHICILLIN RESISTANT STAPHYLOCOCCUS AUREUS      Radiology Studies: MR Lumbar Spine W Wo Contrast  Result Date: 10/17/2020 CLINICAL DATA:  Thoracic laminectomy for epidural abscess 10/15/2020. EXAM: MRI LUMBAR SPINE WITHOUT AND WITH CONTRAST TECHNIQUE: Multiplanar and multiecho pulse sequences of the lumbar spine were obtained without and with intravenous contrast. CONTRAST:  10mL GADAVIST GADOBUTROL 1 MMOL/ML IV SOLN COMPARISON:  Intraoperative radiographs of the thoracic spine 10/15/2020, thoracic MRI 10/14/2020 and CTs of the chest, abdomen and pelvis 10/06/2020. FINDINGS: Segmentation: Transitional lumbosacral anatomy. As correlated with prior imaging, there is a transitional, partially lumbarized S1 segment. Alignment:  Physiologic Vertebrae: Changes of diskitis and osteomyelitis at T11-12 are incompletely visualized. No evidence of disc space infection or osteomyelitis in the lumbar spine. There are chronic endplate degenerative changes at L5-S1 which are asymmetric to the right. Diffusely decreased T1 marrow signal again noted. There are degenerative changes at the sacroiliac joints. No evidence of sacroiliitis. Conus medullaris and cauda equina: Conus extends to the L1-2 level. Conus and cauda equina appear normal. Paraspinal and other soft tissues: No significant lumbar paraspinal abnormalities. T2 hyperintensity and enhancement within the posterior soft tissues of the lower thoracic spine attributed to recent surgery. Disc levels: No significant disc space findings from L1-2 through L3-4. L4-5: Loss of disc height with annular disc bulging and a shallow left paracentral disc protrusion. Mild facet and ligamentous hypertrophy. There is mild narrowing of the left lateral recess with  possible left L5 nerve root encroachment. Both foramina are patent. L5-S1: Chronic degenerative disc disease with loss of disc height, annular disc bulging and endplate osteophytes asymmetric to the right. Mild facet and ligamentous hypertrophy. These factors contribute to mild spinal stenosis and mild lateral recess narrowing bilaterally. There is severe osseous foraminal narrowing on the right at L5-S1. S1-2: Transitional disc space level demonstrates no acquired  abnormality. IMPRESSION: 1. Partially imaged findings of discitis/osteomyelitis at T11-12 status post recent laminectomy. 2. No evidence of discitis or osteomyelitis in the lumbar spine. 3. Shallow left paracentral disc protrusion at L4-5 contributing to left lateral recess narrowing and possible left L5 nerve root encroachment. 4. Chronic degenerative disc disease at L5-S1 with endplate osteophytes asymmetric to the right contributing to chronic right foraminal narrowing. Electronically Signed   By: Carey Bullocks M.D.   On: 10/17/2020 15:34    Scheduled Meds: . acetaminophen  1,000 mg Oral TID  . amLODipine  5 mg Oral Daily  . buprenorphine-naloxone  2 tablet Sublingual BID  . Chlorhexidine Gluconate Cloth  6 each Topical Daily  . docusate sodium  100 mg Oral BID  . mouth rinse  15 mL Mouth Rinse BID  . nicotine  14 mg Transdermal Daily  . pantoprazole (PROTONIX) IV  40 mg Intravenous QHS  . polyethylene glycol  17 g Oral Daily  . senna-docusate  2 tablet Oral QHS  . sodium chloride flush  10-40 mL Intracatheter Q12H  . sodium chloride flush  3 mL Intravenous Q12H  . sodium chloride flush  3 mL Intravenous Q12H   Continuous Infusions: . sodium chloride Stopped (10/16/20 1455)  . vancomycin 1,000 mg (10/18/20 1227)     LOS: 11 days   Time spent: 30 minutes   Marcellus Scott, MD [facility]  10/18/2020, 2:33 PM   To contact the attending provider between 7A-7P or the covering provider during after hours 7P-7A, please  log into the web site [email or web address].

## 2020-10-18 NOTE — Op Note (Signed)
Preoperative diagnosis: T11-12 osteomyelitis discitis epidural abscess with severe thoracic cord compression and thoracic myelopathy  Postoperative diagnosis: Same  Procedure: Decompressive thoracic laminectomy T11 and T12 with T11-12 transpedicular discectomy and evacuation of epidural abscess With foraminotomies of the T11 and T12 nerve roots  Surgeon: Jillyn Hidden Jacier Gladu  Assistant: Julien Girt  Anesthesia: General  EBL: Minimal  HPI: 30 year old gentleman who presented to the hospital a week ago with a thoracic empyema underwent evacuation complaining of back pain work-up revealed discitis osteomyelitis at T11-12 with a epidural collection and spinal cord compression.  Due to patient's progression of clinical syndrome imaging findings and failed conservative treatment I recommended decompressive thoracic laminectomy for evacuation of epidural abscess with possible discectomy.  Extensively went over the risks and benefits of that procedure with him as well as perioperative course expectations of outcome and alternatives of surgery and he understood and agreed to proceed forward.  Operative procedure: Patient was brought into the OR was induced under general anesthesia positioned prone the Wilson frame his back was prepped and draped in routine sterile fashion.  Utilizing preoperative C arm the head of the T12 rib and T12 pedicle was identified and then an incision was drawn out and after adequate prepping and draping of his back midline incision was made and Bovie electrocautery was used take down subcutaneous tissue and subperiosteal dissection was carried lamina of T11 and T12.  Intraoperative x-ray utilizing AP fluoroscopy again confirmed location of the 12th rib and therefore the 12 pedicle.  So the spinous process at T11 and T12 was removed central compression was begun with a complete lamina of T11 and virtually complete lamina of T12 removed.  There was no frank liquid pus identified there was an  extensive mount of phlegmon causing severe cord compression ventral laterally on the left so drilling of the medial facet and part of the T12 pedicle identified the disc base and went into the disc base with micropituitary's removed extensive mount of phlegmon inferior to the disc base decompressing the lateral aspect of the thoracic cord.  Again no frank pus was visualized at this base I did send multiple cultures both the epidural space as well as disc base material.  After adequate bilateral decompressive laminectomy and decompressing that phlegmon collection in the lateral aspect of the cord at the level of the T12 pedicle the wound was copiously irrigated meticulous hemostasis was maintained a medium Hemovac drain was placed and Gelfoam was overlaid top of the dura.  I then closed the wound in layers with interrupted Vicryl and a running 4 subcuticular.  Dermabond benzoin Steri-Strips and a sterile dressing was applied and patient recovery in stable condition.  At the end of the case all needle count sponge counts were correct.

## 2020-10-18 NOTE — Progress Notes (Addendum)
      [address]       [place] [address]             [phone]      9 days post right thoracotomy for drainage of empyema  Subjective: Awake and alert. Said right chest incisional pain is minimal and that he is mostly dealing with back pain.   Objective: Vital signs in last 24 hours: Temp:  [97.8 F (36.6 C)-98.8 F (37.1 C)] 98.8 F (37.1 C) (05/05 1107) Pulse Rate:  [87-107] 107 (05/05 1107) Cardiac Rhythm: Normal sinus rhythm (05/05 0747) Resp:  [18-20] 18 (05/05 1107) BP: (138-160)/(80-98) 160/94 (05/05 1107) SpO2:  [92 %-96 %] 92 % (05/05 1107)   Intake/Output from previous day: 05/04 0701 - 05/05 0700 In: -  Out: 1750 [Urine:1700; Drains:50] Intake/Output this shift: No intake/output data recorded.  General appearance: alert, cooperative and mild distress Lungs: breath sounds are clear. No dyspnea on RA at rest.  Wound: the right thoracotomy incision is wepp approximated with skin staples. The chest tube sutures remain in place aned sites are healing well.   Lab Results: Recent Labs    10/17/20 0500 10/18/20 0500  WBC 6.7 7.1  HGB 9.5* 9.8*  HCT 30.1* 30.7*  PLT 305 295   BMET:  Recent Labs    10/17/20 0500  NA 137  K 4.2  CL 105  CO2 29  GLUCOSE 135*  BUN 20  CREATININE 0.80  CALCIUM 8.6*    PT/INR: No results for input(s): LABPROT, INR in the last 72 hours. ABG    Component Value Date/Time   PHART 7.386 10/10/2020 0458   HCO3 28.7 (H) 10/10/2020 0458   TCO2 30 10/10/2020 0458   O2SAT 99.0 10/10/2020 0458   CBG (last 3)  No results for input(s): GLUCAP in the last 72 hours.  Assessment/Plan:  -POD9 right thoracotomy for drainage of empyema.  The incisions are healing well. ABX per ID and primary teams.  -Will check follow up CXR tomorrow.  -Should be able to get the sutures and staples out soon.   LOS: 11 days   Leary Roca, [place] [phone] 10/18/2020  Agree with above. I would plan to keep  staples and chest tube sutures in for 2 weeks due to morbid obesity as long as staples are not making skin inflamed.

## 2020-10-18 NOTE — Progress Notes (Signed)
Patient ID: Derek Woods, male   DOB: 1990/10/27, 30 y.o.   MRN: [id number] Patient with improved pain control this morning.  Denies any numbness tingling weakness in his legs denies any pain in his legs  Strength 5 out of 5 incision clean dry and intact  Lumbar MRI shows no evidence of infection mild degenerative disc disease.  Mobilized with physical Occupational Therapy no new neurosurgical recommendations when stable from medical and therapy perspective patient can be discharged scheduled follow-up in 2 weeks.

## 2020-10-19 DIAGNOSIS — G061 Intraspinal abscess and granuloma: Secondary | ICD-10-CM

## 2020-10-19 DIAGNOSIS — A4902 Methicillin resistant Staphylococcus aureus infection, unspecified site: Secondary | ICD-10-CM

## 2020-10-19 LAB — CBC WITH DIFFERENTIAL/PLATELET
Abs Immature Granulocytes: 0.03 10*3/uL (ref 0.00–0.07)
Basophils Absolute: 0.1 10*3/uL (ref 0.0–0.1)
Basophils Relative: 1 %
Eosinophils Absolute: 0.2 10*3/uL (ref 0.0–0.5)
Eosinophils Relative: 3 %
HCT: 32.9 % — ABNORMAL LOW (ref 39.0–52.0)
Hemoglobin: 10.6 g/dL — ABNORMAL LOW (ref 13.0–17.0)
Immature Granulocytes: 0 %
Lymphocytes Relative: 29 %
Lymphs Abs: 2.3 10*3/uL (ref 0.7–4.0)
MCH: 30.3 pg (ref 26.0–34.0)
MCHC: 32.2 g/dL (ref 30.0–36.0)
MCV: 94 fL (ref 80.0–100.0)
Monocytes Absolute: 0.2 10*3/uL (ref 0.1–1.0)
Monocytes Relative: 3 %
Neutro Abs: 5.1 10*3/uL (ref 1.7–7.7)
Neutrophils Relative %: 64 %
Platelets: 351 10*3/uL (ref 150–400)
RBC: 3.5 MIL/uL — ABNORMAL LOW (ref 4.22–5.81)
RDW: 14.8 % (ref 11.5–15.5)
WBC: 7.9 10*3/uL (ref 4.0–10.5)
nRBC: 0 % (ref 0.0–0.2)

## 2020-10-19 LAB — BASIC METABOLIC PANEL
Anion gap: 8 (ref 5–15)
BUN: 18 mg/dL (ref 6–20)
CO2: 28 mmol/L (ref 22–32)
Calcium: 9.3 mg/dL (ref 8.9–10.3)
Chloride: 97 mmol/L — ABNORMAL LOW (ref 98–111)
Creatinine, Ser: 0.83 mg/dL (ref 0.61–1.24)
GFR, Estimated: >60 mL/min (ref >60)
Glucose, Bld: 116 mg/dL — ABNORMAL HIGH (ref 70–99)
Potassium: 4.9 mmol/L (ref 3.5–5.1)
Sodium: 133 mmol/L — ABNORMAL LOW (ref 135–145)

## 2020-10-19 LAB — HCV AB W REFLEX TO QUANT PCR: HCV Ab: 2.1 s/co ratio — ABNORMAL HIGH (ref 0.0–0.9)

## 2020-10-19 LAB — HCV RT-PCR, QUANT (NON-GRAPH): Hepatitis C Quantitation: NOT DETECTED IU/mL

## 2020-10-19 NOTE — Progress Notes (Signed)
Physical Therapy Treatment Patient Details Name: Derek Woods MRN: [id number] DOB: [birth year] Today's Date: 10/19/2020    History of Present Illness [name] 30 yo male who presented on 10/07/20 with shortness of breath and found to have large R empyema.  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  Medical hx includes daily heroin IVDA (previous suboxone use), asthma, morbid obesity, OSA, and smokes.    PT Comments    Patient agreeable to PT session. Patient ambulated 3' with RW and min guard. Patient required cues for maintaining back precautions during ambulation as he tends to lean forward on RW. Discharge recommendation updated to HHPT to maximize functional independence in the home.     Follow Up Recommendations  Home health PT;Supervision for mobility/OOB     Equipment Recommendations  Rolling Reco [place]onk with 5" wheels;3in1 (PT)    Recommendations for Other Services       Precautions / Restrictions Precautions Precautions: Back;Fall Precaution Booklet Issued: No Precaution Comments: reviewed precautions with pt Restrictions Weight Bearing Restrictions: No    Mobility  Bed Mobility Overal bed mobility: Needs Assistance Bed Mobility: Rolling;Sidelying to Sit Rolling: Min guard Sidelying to sit: Min guard       General bed mobility comments: min guard for safety    Transfers Overall transfer level: Needs assistance Equipment used: Rolling Biana Haggar (2 wheeled) Transfers: Sit to/from Stand Sit to Stand: Min guard         General transfer comment: min guard for safety  Ambulation/Gait Ambulation/Gait assistance: Min guard Gait Distance (Feet): 60 Feet Assistive device: Rolling Honesty Menta (2 wheeled) Gait Pattern/deviations: Step-to pattern;Trunk flexed;Decreased stride length Gait  velocity: decreased   General Gait Details: Cues for upright posture and RW management. Min guard for Futures trader    Modified Rankin (Stroke Patients Only)       Balance Overall balance assessment: Needs assistance Sitting-balance support: Bilateral upper extremity supported Sitting balance-Leahy Scale: Fair     Standing balance support: Bilateral upper extremity supported;During functional activity;No upper extremity supported Standing balance-Leahy Scale: Poor Standing balance comment: Reliant on UE support                            Cognition Arousal/Alertness: Awake/alert Behavior During Therapy: WFL for tasks assessed/performed Overall Cognitive Status: Within Functional Limits for tasks assessed                                        Exercises      General Comments        Pertinent Vitals/Pain Pain Assessment: 0-10 Pain Score: 4  Pain Location: back Pain Descriptors / Indicators: Aching Pain Intervention(s): Monitored during session;Repositioned    Home Living                      Prior Function            PT Goals (current goals can now be found in the care plan section) Acute Rehab PT Goals Patient Stated Goal: to get better PT Goal Formulation: With patient Time For Goal Achievement: 10/30/20 Potential to Achieve  Goals: Good Progress towards PT goals: Progressing toward goals    Frequency    Min 5X/week      PT Plan Discharge plan needs to be updated    Co-evaluation              AM-PAC PT "6 Clicks" Mobility   Outcome Measure  Help needed turning from your back to your side while in a flat bed without using bedrails?: A Little Help needed moving from lying on your back to sitting on the side of a flat bed without using bedrails?: A Little Help needed moving to and from a bed to a chair (including a wheelchair)?: A Little Help needed standing up  from a chair using your arms (e.g., wheelchair or bedside chair)?: A Little Help needed to walk in hospital room?: A Little Help needed climbing 3-5 steps with a railing? : A Lot 6 Click Score: 17    End of Session   Activity Tolerance: Patient limited by pain Patient left: in chair;with call bell/phone within reach;with chair alarm set Nurse Communication: Mobility status PT Visit Diagnosis: Other abnormalities of gait and mobility (R26.89);Muscle weakness (generalized) (M62.81);Pain     Time: 7588-3254 PT Time Calculation (min) (ACUTE ONLY): 25 min  Charges:  $Therapeutic Activity: 23-37 mins                     Derek Woods A. Derek Woods PT, DPT Acute Rehabilitation Services Pager [phone] Office [phone]    Derek Woods 10/19/2020, 1:22 PM

## 2020-10-19 NOTE — Progress Notes (Addendum)
[facility]  PROGRESS NOTE  Derek Woods HUT:[id number] DOB: June 27, 1990 DOA: 10/07/2020 PCP: Pcp, No   Brief HPI:   30 year old male with history of daily heroin IVDA, previous Suboxone use, asthma, morbid obesity, tobacco abuse, possible OSA presented to [facility] on 4/24 with complaints of back pain, right flank pain and 1 week shortness of breath.  CT of the chest revealed large right empyema.  He was taken to the OR on 4/26 with Dr. Virgel Gess for VATS and drainage of right empyema, however right thoracotomy had to be performed.  He was difficult intubation for the procedure and was unable to be weaned off from ventilator postoperatively.  There were reported difficulty with oxygenation during the procedure.  PCCM was consulted for vent management and they resumed care as primary team.  Patient was extubated on 10/10/2020 and was transferred under [facility] care on 10/11/2020.  Posterior chest tube was removed on 4/29.  Thoracotomy culture growing MRSA.  Antibiotics were changed to vancomycin.  ID was consulted on 4/30.  MRI thoracic spine was done which showed T11/T12 discitis and possible abscess.  S/p decompressive thoracic laminectomy T11/12 by neurosurgery on 10/15/2020.  Has a Hemovac.  Hep C antibody positive.    Subjective   Patient seen and examined, denies any complaints.   Assessment/Plan:     1. Acute respiratory failure with hypercarbia and hypoxemia/MRSA empyema s/p right thoracotomy on 4/26-patient required intubation on 4/26 and was extubated on 4/27.  S/p thoracotomy with VATS chest tube.  One of them was removed few days ago and was removed on 10/14/2020.  Culture grew MRSA.  Patient is currently on vancomycin.  CT surgery following.  Hypoxemia resolved.  As per ID recommended 6 weeks of IV antibiotics from date of source control on 5/2 given severity of infection involving lung and spine.  Patient agreeable to complete 6 weeks of IV antibiotics in the hospital.  Tentative end date of  antibiotics is 11/27/2020.  ID indicates that if he changes his mind he decides to leave AMA they can consider alternative option like oritavancin at that time.  ID will be consulted again and is approaching end date of antibiotics for reevaluation for transition to oral antibiotics plus minus imaging.  Monitor labs as per indicated by ID. 2. 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  Hemovac was discontinued on 5/4.  Lumbar MRI showed no evidence of infection.  Neurosurgery signed off 5/5 and recommended outpatient follow-up in 2 weeks.  Antibiotic regimen per ID.  Multimodality pain control-Suboxone increased to 4 mg twice daily, continue Tylenol.   3. Anxiety-patient takes hydroxyzine 4. Hypertension-blood pressure was elevated, amlodipine has been restarted.  Continue as needed hydralazine.  Blood pressure is now well controlled 5. Tobacco abuse-smoking cessation counseling provided. 6. Normocytic anemia-hemoglobin is over 9.  Monitor CBC periodically. 7. Positive hepatitis C antibody-no known previous history of hepatitis C infection.  Antibody positive.  I will follow-up HCVRNA and have made follow-up appointment at our [facility] for HCV treatment on 6/21 at 8:45 AM. 8. Morbid obesity-BMI 52.2 kg/m   Scheduled medications:   . acetaminophen  1,000 mg Oral TID  . amLODipine  5 mg Oral Daily  . buprenorphine-naloxone  2 tablet Sublingual BID  . Chlorhexidine Gluconate Cloth  6 each Topical Daily  . docusate sodium  100 mg Oral BID  . mouth rinse  15 mL Mouth Rinse BID  . nicotine  14 mg Transdermal Daily  .  pantoprazole  40 mg Oral QHS  . polyethylene glycol  17 g Oral Daily  . senna-docusate  2 tablet Oral QHS  . sodium chloride flush  10-40 mL Intracatheter Q12H  . sodium chloride flush  3 mL Intravenous Q12H  . sodium chloride flush  3 mL Intravenous Q12H         Data Reviewed:   CBG:  No  results for input(s): GLUCAP in the last 168 hours.  SpO2: 95 % O2 Flow Rate (L/min): 2 L/min FiO2 (%): 40 %    Vitals:   10/19/20 0507 10/19/20 0823 10/19/20 1255 10/19/20 1531  BP: (!) 156/100 (!) 135/91 (!) 148/93 127/74  Pulse: (!) 105 89 (!) 106 96  Resp: 18 16 20 19   Temp: 98.7 F (37.1 C) 98.5 F (36.9 C) 98.3 F (36.8 C) 98.5 F (36.9 C)  TempSrc: Oral Oral Oral Oral  SpO2: 96% 94% 94% 95%  Weight:      Height:         Intake/Output Summary (Last 24 hours) at 10/19/2020 1848 Last data filed at 10/19/2020 1810 Gross per 24 hour  Intake --  Output 1250 ml  Net -1250 ml    05/04 1901 - 05/06 0700 In: -  Out: 1650 [Urine:1650]  Filed Weights   10/07/20 1006  Weight: (!) 146.7 kg    CBC:  Recent Labs  Lab 10/15/20 0353 10/16/20 0422 10/17/20 0500 10/18/20 0500 10/19/20 0500  WBC 6.1 8.6 6.7 7.1 7.9  HGB 9.3* 9.5* 9.5* 9.8* 10.6*  HCT 29.1* 29.5* 30.1* 30.7* 32.9*  PLT 269 322 305 295 351  MCV 93.9 92.5 94.7 95.3 94.0  MCH 30.0 29.8 29.9 30.4 30.3  MCHC 32.0 32.2 31.6 31.9 32.2  RDW 14.3 13.8 14.8 14.9 14.8  LYMPHSABS 2.0 1.4 2.1 1.9 2.3  MONOABS 0.2 0.2 0.3 0.2 0.2  EOSABS 0.3 0.0 0.2 0.2 0.2  BASOSABS 0.1 0.0 0.1 0.1 0.1    Complete metabolic panel:  Recent Labs  Lab 10/13/20 0339 10/14/20 0901 10/15/20 0353 10/17/20 0500 10/19/20 0500  NA 134*  --  136 137 133*  K 4.3  --  4.7 4.2 4.9  CL 100  --  100 105 97*  CO2 28  --  30 29 28   GLUCOSE 123*  --  109* 135* 116*  BUN 19  --  18 20 18   CREATININE 0.76  --  0.82 0.80 0.83  CALCIUM 8.7*  --  8.7* 8.6* 9.3  AST 32  --   --   --   --   ALT 20  --   --   --   --   ALKPHOS 107  --   --   --   --   BILITOT 0.6  --   --   --   --   ALBUMIN 2.1*  --   --   --   --   INR  --  1.0  --   --   --     No results for input(s): LIPASE, AMYLASE in the last 168 hours.  No results for input(s): CRP, DDIMER, BNP, PROCALCITON, SARSCOV2NAA in the last 168 hours.  Invalid input(s): LACTICACID   ------------------------------------------------------------------------------------------------------------------ No results for input(s): CHOL, HDL, LDLCALC, TRIG, CHOLHDL, LDLDIRECT in the last 72 hours.  No results found for: HGBA1C ------------------------------------------------------------------------------------------------------------------ No results for input(s): TSH, T4TOTAL, T3FREE, THYROIDAB in the last 72 hours.  Invalid input(s): FREET3 ------------------------------------------------------------------------------------------------------------------ No results for input(s): VITAMINB12, FOLATE, FERRITIN, TIBC,  IRON, RETICCTPCT in the last 72 hours.  Coagulation profile Recent Labs  Lab 10/14/20 0901  INR 1.0   No results for input(s): DDIMER in the last 72 hours.  Cardiac Enzymes No results for input(s): CKTOTAL, CKMB, CKMBINDEX, TROPONINI in the last 168 hours.  ------------------------------------------------------------------------------------------------------------------ No results found for: BNP   Antibiotics: Anti-infectives (From admission, onward)   Start     Dose/Rate Route Frequency Ordered Stop   10/18/20 1000  vancomycin (VANCOREADY) IVPB 1000 mg/200 mL        1,000 mg 200 mL/hr over 60 Minutes Intravenous Every 12 hours 10/17/20 1601     10/15/20 1830  ceFAZolin (ANCEF) IVPB 2g/100 mL premix        2 g 200 mL/hr over 30 Minutes Intravenous Every 8 hours 10/15/20 1744 10/16/20 0659   10/13/20 1200  vancomycin (VANCOREADY) IVPB 2000 mg/400 mL  Status:  Discontinued        2,000 mg 200 mL/hr over 120 Minutes Intravenous Every 24 hours 10/12/20 1115 10/17/20 1559   10/12/20 1215  vancomycin (VANCOREADY) IVPB 500 mg/100 mL        500 mg 100 mL/hr over 60 Minutes Intravenous  Once 10/12/20 1115 10/12/20 1329   10/12/20 0600  vancomycin (VANCOREADY) IVPB 1500 mg/300 mL  Status:  Discontinued        1,500 mg 150 mL/hr over 120 Minutes Intravenous  Every 24 hours 10/12/20 0541 10/12/20 0556   10/12/20 0600  vancomycin (VANCOREADY) IVPB 1500 mg/300 mL  Status:  Discontinued        1,500 mg 150 mL/hr over 120 Minutes Intravenous Every 18 hours 10/12/20 0556 10/12/20 1115   10/11/20 1343  vancomycin variable dose per unstable renal function (pharmacist dosing)  Status:  Discontinued         Does not apply See admin instructions 10/11/20 1344 10/12/20 0556   10/10/20 0400  vancomycin (VANCOREADY) IVPB 1250 mg/250 mL  Status:  Discontinued        1,250 mg 166.7 mL/hr over 90 Minutes Intravenous Every 8 hours 10/09/20 1835 10/11/20 1344   10/09/20 2000  vancomycin (VANCOREADY) IVPB 1250 mg/250 mL  Status:  Discontinued        1,250 mg 166.7 mL/hr over 90 Minutes Intravenous Every 8 hours 10/09/20 1117 10/09/20 1835   10/09/20 1900  vancomycin (VANCOCIN) 2,500 mg in sodium chloride 0.9 % 500 mL IVPB        2,500 mg 250 mL/hr over 120 Minutes Intravenous  Once 10/09/20 1835 10/09/20 2137   10/09/20 1200  vancomycin (VANCOCIN) 2,500 mg in sodium chloride 0.9 % 500 mL IVPB  Status:  Discontinued        2,500 mg 250 mL/hr over 120 Minutes Intravenous  Once 10/09/20 1113 10/09/20 1835   10/07/20 2200  vancomycin (VANCOREADY) IVPB 2000 mg/400 mL  Status:  Discontinued        2,000 mg 200 mL/hr over 120 Minutes Intravenous Every 12 hours 10/07/20 1251 10/07/20 1437   10/07/20 2000  ceFEPIme (MAXIPIME) 2 g in sodium chloride 0.9 % 100 mL IVPB  Status:  Discontinued        2 g 200 mL/hr over 30 Minutes Intravenous Every 8 hours 10/07/20 1251 10/11/20 1342   10/07/20 1145  ceFEPIme (MAXIPIME) 2 g in sodium chloride 0.9 % 100 mL IVPB        2 g 200 mL/hr over 30 Minutes Intravenous  Once 10/07/20 1048 10/07/20 1351   10/07/20 1100  vancomycin (VANCOCIN) 2,500 mg  in sodium chloride 0.9 % 500 mL IVPB  Status:  Discontinued        2,500 mg 250 mL/hr over 120 Minutes Intravenous  Once 10/07/20 1048 10/07/20 1437       Radiology Reports  No  results found.    DVT prophylaxis: SCDs  Code Status: Full code  Family Communication: No family at bedside   Consultants:  CT surgery  PCCM  ID  Procedures:  Chest tube placement, thoracotomy, intubation  Right upper arm PICC line  S/p decompressive thoracic laminectomy T11/12 on 10/15/2020    Objective    Physical Examination:    General-appears in no acute distress  Heart-S1-S2, regular, no murmur auscultated  Lungs-clear to auscultation bilaterally, no wheezing or crackles auscultated  Abdomen-soft, nontender, no organomegaly  Extremities-no edema in the lower extremities  Neuro-alert, oriented x3, no focal deficit noted   Status is: Inpatient  Dispo: The patient is from: Home              Anticipated d/c is to: Home              Anticipated d/c date is: 11/27/2020              Patient currently not stable for discharge  Barrier to discharge-getting IV antibiotics  COVID-19 Labs  No results for input(s): DDIMER, FERRITIN, LDH, CRP in the last 72 hours.  No results found for: SARSCOV2NAA  Microbiology  Recent Results (from the past 240 hour(s))  Aerobic/Anaerobic Culture w Gram Stain (surgical/deep wound)     Status: None (Preliminary result)   Collection Time: 10/15/20  3:01 PM   Specimen: PATH Other; Tissue  Result Value Ref Range Status   Specimen Description ABSCESS  Final   Special Requests T11 12 EPIDURAL PATIENT ON FOLLOWING VANC  Final   Gram Stain   Final    NO WBC SEEN NO ORGANISMS SEEN Performed at [facility], [address], [place], [place] [address]    Culture   Final    RARE METHICILLIN RESISTANT STAPHYLOCOCCUS AUREUS NO ANAEROBES ISOLATED; CULTURE IN PROGRESS FOR 5 DAYS    Report Status PENDING  Incomplete   Organism ID, Bacteria METHICILLIN RESISTANT STAPHYLOCOCCUS AUREUS  Final      Susceptibility   Methicillin resistant staphylococcus aureus - MIC*    CIPROFLOXACIN >=8 RESISTANT Resistant      ERYTHROMYCIN >=8 RESISTANT Resistant     GENTAMICIN <=0.5 SENSITIVE Sensitive     OXACILLIN >=4 RESISTANT Resistant     TETRACYCLINE >=16 RESISTANT Resistant     VANCOMYCIN <=0.5 SENSITIVE Sensitive     TRIMETH/SULFA <=10 SENSITIVE Sensitive     CLINDAMYCIN <=0.25 SENSITIVE Sensitive     RIFAMPIN <=0.5 SENSITIVE Sensitive     Inducible Clindamycin NEGATIVE Sensitive     * RARE METHICILLIN RESISTANT STAPHYLOCOCCUS AUREUS             Fabian Walder S Cayleb Jarnigan   [facility] If 7PM-7AM, please contact night-coverage at [email or web address], Office  [phone]   10/19/2020, 6:48 PM  LOS: 12 days

## 2020-10-20 LAB — CBC WITH DIFFERENTIAL/PLATELET
Abs Immature Granulocytes: 0.02 10*3/uL (ref 0.00–0.07)
Basophils Absolute: 0.1 10*3/uL (ref 0.0–0.1)
Basophils Relative: 1 %
Eosinophils Absolute: 0.2 10*3/uL (ref 0.0–0.5)
Eosinophils Relative: 3 %
HCT: 29.9 % — ABNORMAL LOW (ref 39.0–52.0)
Hemoglobin: 9.6 g/dL — ABNORMAL LOW (ref 13.0–17.0)
Immature Granulocytes: 0 %
Lymphocytes Relative: 25 %
Lymphs Abs: 1.6 10*3/uL (ref 0.7–4.0)
MCH: 30.6 pg (ref 26.0–34.0)
MCHC: 32.1 g/dL (ref 30.0–36.0)
MCV: 95.2 fL (ref 80.0–100.0)
Monocytes Absolute: 0.2 10*3/uL (ref 0.1–1.0)
Monocytes Relative: 3 %
Neutro Abs: 4.4 10*3/uL (ref 1.7–7.7)
Neutrophils Relative %: 68 %
Platelets: 291 10*3/uL (ref 150–400)
RBC: 3.14 MIL/uL — ABNORMAL LOW (ref 4.22–5.81)
RDW: 15 % (ref 11.5–15.5)
WBC: 6.4 10*3/uL (ref 4.0–10.5)
nRBC: 0 % (ref 0.0–0.2)

## 2020-10-20 NOTE — Progress Notes (Signed)
[facility]  PROGRESS NOTE  Derek Woods ALP:[id number] DOB: 1990/11/01 DOA: 10/07/2020 PCP: Pcp, No   Brief HPI:   30 year old male with history of daily heroin IVDA, previous Suboxone use, asthma, morbid obesity, tobacco abuse, possible OSA presented to [facility] on 4/24 with complaints of back pain, right flank pain and 1 week shortness of breath.  CT of the chest revealed large right empyema.  He was taken to the OR on 4/26 with Dr. Virgel Gess for VATS and drainage of right empyema, however right thoracotomy had to be performed.  He was difficult intubation for the procedure and was unable to be weaned off from ventilator postoperatively.  There were reported difficulty with oxygenation during the procedure.  PCCM was consulted for vent management and they resumed care as primary team.  Patient was extubated on 10/10/2020 and was transferred under [facility] care on 10/11/2020.  Posterior chest tube was removed on 4/29.  Thoracotomy culture growing MRSA.  Antibiotics were changed to vancomycin.  ID was consulted on 4/30.  MRI thoracic spine was done which showed T11/T12 discitis and possible abscess.  S/p decompressive thoracic laminectomy T11/12 by neurosurgery on 10/15/2020.  Has a Hemovac.  Hep C antibody positive.    Subjective   Patient seen and examined, denies any complaints.   Assessment/Plan:     1. Acute respiratory failure with hypercarbia and hypoxemia/MRSA empyema s/p right thoracotomy on 4/26-patient required intubation on 4/26 and was extubated on 4/27.  S/p thoracotomy with VATS chest tube.  One of them was removed few days ago and was removed on 10/14/2020.  Culture grew MRSA.  Patient is currently on vancomycin.  CT surgery following.  Hypoxemia resolved.  As per ID recommended 6 weeks of IV antibiotics from date of source control on 5/2 given severity of infection involving lung and spine.  Patient agreeable to complete 6 weeks of IV antibiotics in the hospital.  Tentative end date of  antibiotics is 11/27/2020.  ID indicates that if he changes his mind he decides to leave AMA they can consider alternative option like oritavancin at that time.  ID will be consulted again and is approaching end date of antibiotics for reevaluation for transition to oral antibiotics plus minus imaging.  Monitor labs as  indicated by ID.  2. 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  Hemovac was discontinued on 5/4.  Lumbar MRI showed no evidence of infection.  Neurosurgery signed off 5/5 and recommended outpatient follow-up in 2 weeks.  Antibiotic regimen per ID.  Multimodality pain control-Suboxone increased to 4 mg twice daily, continue Tylenol.    3. Anxiety-patient takes hydroxyzine.  4. Hypertension-blood pressure was elevated, amlodipine has been restarted.  Continue as needed hydralazine.  Blood pressure is now well controlled.  5. Tobacco abuse-smoking cessation counseling provided.  6. Normocytic anemia-hemoglobin is over 9.  Monitor CBC periodically.    7. Positive hepatitis C antibody-no known previous history of hepatitis C infection.  Antibody positive.  I will follow-up HCVRNA and have made follow-up appointment at our [facility] for HCV treatment on 6/21 at 8:45 AM.  8. Morbid obesity-BMI 52.2 kg/m   Scheduled medications:   . acetaminophen  1,000 mg Oral TID  . amLODipine  5 mg Oral Daily  . buprenorphine-naloxone  2 tablet Sublingual BID  . Chlorhexidine Gluconate Cloth  6 each Topical Daily  . docusate sodium  100 mg Oral BID  . mouth rinse  15 mL Mouth Rinse BID  .  nicotine  14 mg Transdermal Daily  . pantoprazole  40 mg Oral QHS  . polyethylene glycol  17 g Oral Daily  . senna-docusate  2 tablet Oral QHS  . sodium chloride flush  10-40 mL Intracatheter Q12H  . sodium chloride flush  3 mL Intravenous Q12H  . sodium chloride flush  3 mL Intravenous Q12H         Data Reviewed:    CBG:  No results for input(s): GLUCAP in the last 168 hours.  SpO2: 96 % O2 Flow Rate (L/min): 2 L/min FiO2 (%): 40 %    Vitals:   10/19/20 2330 10/20/20 0336 10/20/20 0900 10/20/20 1235  BP: 130/72 126/75 (!) 151/92 131/78  Pulse: 89 90 96 88  Resp: 19 18 18 18   Temp: 98.6 F (37 C) 98.3 F (36.8 C) 98.4 F (36.9 C) 98.2 F (36.8 C)  TempSrc: Oral Oral Oral Oral  SpO2: 95% 94% 94% 96%  Weight:      Height:         Intake/Output Summary (Last 24 hours) at 10/20/2020 1450 Last data filed at 10/20/2020 0600 Gross per 24 hour  Intake 425.92 ml  Output 2870 ml  Net -2444.08 ml    05/05 1901 - 05/07 0700 In: 425.9  Out: 3820 [Urine:3820]  Filed Weights   10/07/20 1006  Weight: (!) 146.7 kg    CBC:  Recent Labs  Lab 10/16/20 0422 10/17/20 0500 10/18/20 0500 10/19/20 0500 10/20/20 0500  WBC 8.6 6.7 7.1 7.9 6.4  HGB 9.5* 9.5* 9.8* 10.6* 9.6*  HCT 29.5* 30.1* 30.7* 32.9* 29.9*  PLT 322 305 295 351 291  MCV 92.5 94.7 95.3 94.0 95.2  MCH 29.8 29.9 30.4 30.3 30.6  MCHC 32.2 31.6 31.9 32.2 32.1  RDW 13.8 14.8 14.9 14.8 15.0  LYMPHSABS 1.4 2.1 1.9 2.3 1.6  MONOABS 0.2 0.3 0.2 0.2 0.2  EOSABS 0.0 0.2 0.2 0.2 0.2  BASOSABS 0.0 0.1 0.1 0.1 0.1    Complete metabolic panel:  Recent Labs  Lab 10/14/20 0901 10/15/20 0353 10/17/20 0500 10/19/20 0500  NA  --  136 137 133*  K  --  4.7 4.2 4.9  CL  --  100 105 97*  CO2  --  30 29 28   GLUCOSE  --  109* 135* 116*  BUN  --  18 20 18   CREATININE  --  0.82 0.80 0.83  CALCIUM  --  8.7* 8.6* 9.3  INR 1.0  --   --   --     No results for input(s): LIPASE, AMYLASE in the last 168 hours.  No results for input(s): CRP, DDIMER, BNP, PROCALCITON, SARSCOV2NAA in the last 168 hours.  Invalid input(s): LACTICACID  ------------------------------------------------------------------------------------------------------------------ No results for input(s): CHOL, HDL, LDLCALC, TRIG, CHOLHDL, LDLDIRECT in the last 72  hours.  No results found for: HGBA1C ------------------------------------------------------------------------------------------------------------------ No results for input(s): TSH, T4TOTAL, T3FREE, THYROIDAB in the last 72 hours.  Invalid input(s): FREET3 ------------------------------------------------------------------------------------------------------------------ No results for input(s): VITAMINB12, FOLATE, FERRITIN, TIBC, IRON, RETICCTPCT in the last 72 hours.  Coagulation profile Recent Labs  Lab 10/14/20 0901  INR 1.0   No results for input(s): DDIMER in the last 72 hours.  Cardiac Enzymes No results for input(s): CKTOTAL, CKMB, CKMBINDEX, TROPONINI in the last 168 hours.  ------------------------------------------------------------------------------------------------------------------ No results found for: BNP   Antibiotics: Anti-infectives (From admission, onward)   Start     Dose/Rate Route Frequency Ordered Stop   10/18/20 1000  vancomycin (VANCOREADY) IVPB 1000 mg/200  mL        1,000 mg 200 mL/hr over 60 Minutes Intravenous Every 12 hours 10/17/20 1601     10/15/20 1830  ceFAZolin (ANCEF) IVPB 2g/100 mL premix        2 g 200 mL/hr over 30 Minutes Intravenous Every 8 hours 10/15/20 1744 10/16/20 0659   10/13/20 1200  vancomycin (VANCOREADY) IVPB 2000 mg/400 mL  Status:  Discontinued        2,000 mg 200 mL/hr over 120 Minutes Intravenous Every 24 hours 10/12/20 1115 10/17/20 1559   10/12/20 1215  vancomycin (VANCOREADY) IVPB 500 mg/100 mL        500 mg 100 mL/hr over 60 Minutes Intravenous  Once 10/12/20 1115 10/12/20 1329   10/12/20 0600  vancomycin (VANCOREADY) IVPB 1500 mg/300 mL  Status:  Discontinued        1,500 mg 150 mL/hr over 120 Minutes Intravenous Every 24 hours 10/12/20 0541 10/12/20 0556   10/12/20 0600  vancomycin (VANCOREADY) IVPB 1500 mg/300 mL  Status:  Discontinued        1,500 mg 150 mL/hr over 120 Minutes Intravenous Every 18 hours  10/12/20 0556 10/12/20 1115   10/11/20 1343  vancomycin variable dose per unstable renal function (pharmacist dosing)  Status:  Discontinued         Does not apply See admin instructions 10/11/20 1344 10/12/20 0556   10/10/20 0400  vancomycin (VANCOREADY) IVPB 1250 mg/250 mL  Status:  Discontinued        1,250 mg 166.7 mL/hr over 90 Minutes Intravenous Every 8 hours 10/09/20 1835 10/11/20 1344   10/09/20 2000  vancomycin (VANCOREADY) IVPB 1250 mg/250 mL  Status:  Discontinued        1,250 mg 166.7 mL/hr over 90 Minutes Intravenous Every 8 hours 10/09/20 1117 10/09/20 1835   10/09/20 1900  vancomycin (VANCOCIN) 2,500 mg in sodium chloride 0.9 % 500 mL IVPB        2,500 mg 250 mL/hr over 120 Minutes Intravenous  Once 10/09/20 1835 10/09/20 2137   10/09/20 1200  vancomycin (VANCOCIN) 2,500 mg in sodium chloride 0.9 % 500 mL IVPB  Status:  Discontinued        2,500 mg 250 mL/hr over 120 Minutes Intravenous  Once 10/09/20 1113 10/09/20 1835   10/07/20 2200  vancomycin (VANCOREADY) IVPB 2000 mg/400 mL  Status:  Discontinued        2,000 mg 200 mL/hr over 120 Minutes Intravenous Every 12 hours 10/07/20 1251 10/07/20 1437   10/07/20 2000  ceFEPIme (MAXIPIME) 2 g in sodium chloride 0.9 % 100 mL IVPB  Status:  Discontinued        2 g 200 mL/hr over 30 Minutes Intravenous Every 8 hours 10/07/20 1251 10/11/20 1342   10/07/20 1145  ceFEPIme (MAXIPIME) 2 g in sodium chloride 0.9 % 100 mL IVPB        2 g 200 mL/hr over 30 Minutes Intravenous  Once 10/07/20 1048 10/07/20 1351   10/07/20 1100  vancomycin (VANCOCIN) 2,500 mg in sodium chloride 0.9 % 500 mL IVPB  Status:  Discontinued        2,500 mg 250 mL/hr over 120 Minutes Intravenous  Once 10/07/20 1048 10/07/20 1437       Radiology Reports  No results found.    DVT prophylaxis: SCDs  Code Status: Full code  Family Communication: No family at bedside   Consultants:  CT surgery  PCCM  ID  Procedures:  Chest tube placement,  thoracotomy, intubation  Right upper arm PICC line  S/p decompressive thoracic laminectomy T11/12 on 10/15/2020    Objective    Physical Examination:    General-appears in no acute distress  Heart-S1-S2, regular, no murmur auscultated  Lungs-clear to auscultation bilaterally, no wheezing or crackles auscultated  Abdomen-soft, nontender, no organomegaly  Extremities-no edema in the lower extremities  Neuro-alert, oriented x3, no focal deficit noted   Status is: Inpatient  Dispo: The patient is from: Home              Anticipated d/c is to: Home              Anticipated d/c date is: 11/27/2020              Patient currently not stable for discharge  Barrier to discharge-getting IV antibiotics  COVID-19 Labs  No results for input(s): DDIMER, FERRITIN, LDH, CRP in the last 72 hours.  No results found for: SARSCOV2NAA  Microbiology  Recent Results (from the past 240 hour(s))  Aerobic/Anaerobic Culture w Gram Stain (surgical/deep wound)     Status: None (Preliminary result)   Collection Time: 10/15/20  3:01 PM   Specimen: PATH Other; Tissue  Result Value Ref Range Status   Specimen Description ABSCESS  Final   Special Requests T11 12 EPIDURAL PATIENT ON FOLLOWING VANC  Final   Gram Stain   Final    NO WBC SEEN NO ORGANISMS SEEN Performed at [facility], [address] [place], [place] [address]    Culture   Final    RARE METHICILLIN RESISTANT STAPHYLOCOCCUS AUREUS NO ANAEROBES ISOLATED; CULTURE IN PROGRESS FOR 5 DAYS    Report Status PENDING  Incomplete   Organism ID, Bacteria METHICILLIN RESISTANT STAPHYLOCOCCUS AUREUS  Final      Susceptibility   Methicillin resistant staphylococcus aureus - MIC*    CIPROFLOXACIN >=8 RESISTANT Resistant     ERYTHROMYCIN >=8 RESISTANT Resistant     GENTAMICIN <=0.5 SENSITIVE Sensitive     OXACILLIN >=4 RESISTANT Resistant     TETRACYCLINE >=16 RESISTANT Resistant     VANCOMYCIN <=0.5 SENSITIVE Sensitive      TRIMETH/SULFA <=10 SENSITIVE Sensitive     CLINDAMYCIN <=0.25 SENSITIVE Sensitive     RIFAMPIN <=0.5 SENSITIVE Sensitive     Inducible Clindamycin NEGATIVE Sensitive     * RARE METHICILLIN RESISTANT STAPHYLOCOCCUS AUREUS             Rydge [address] Denicia Pagliarulo   [facility] If 7PM-7AM, please contact night-coverage at [email or web address], Office  [phone]   10/20/2020, 2:50 PM  LOS: 13 days

## 2020-10-20 NOTE — Progress Notes (Signed)
Pharmacy Antibiotic Note  Derek Woods is a 30 y.o. male admitted on 10/07/2020 with loculated pleural effusion with empyema now s/p VATS and thoracotomy. Also with T11-T12 discitis and osteomyelitis, epidural abscess s/p thoracic laminectomy. Pharmacy has been consulted for vancomycin dosing.  Renal function stable, Scr 0.8 on 5/6.  Will repeat BMET tomorrow morning to ensure renal function stable.   Plan: Vancomycin to 1000mg  IV q12h (eAUC 575) Monitor renal function, levels as indicated   Height: 5\' 6"  (167.6 cm) Weight: (!) 146.7 kg (323 lb 6.4 oz) IBW/kg (Calculated) : 63.8  Temp (24hrs), Avg:98.4 F (36.9 C), Min:98.3 F (36.8 C), Max:98.6 F (37 C)  Recent Labs  Lab 10/15/20 0353 10/16/20 0422 10/16/20 1545 10/17/20 0500 10/17/20 1215 10/18/20 0500 10/19/20 0500 10/20/20 0500  WBC 6.1 8.6  --  6.7  --  7.1 7.9 6.4  CREATININE 0.82  --   --  0.80  --   --  0.83  --   VANCOTROUGH  --   --   --   --  8*  --   --   --   VANCOPEAK  --   --  45*  --   --   --   --   --     Estimated Creatinine Clearance: 176.9 mL/min (by C-G formula based on SCr of 0.83 mg/dL).    Allergies  Allergen Reactions  . Augmentin [Amoxicillin-Pot Clavulanate] Other (See Comments)    Pt does not recall, childhood  . Ceclor [Cefaclor] Other (See Comments)    Pt does not recall, childhood  . Sulfa Antibiotics Hives    Antimicrobials this admission: 4/24 vancomycin >> 4/24 cefepime >> 4/28  Dose adjustments this admission: 4/28 Vanc random 30 - hold vancomycin 4/29 Vanc random 11 - start 2000mg  IV q24h 5/4 AUC 575 on 2000mg  IV q24 > 1000mg  IV q12h (eAUC 575)  Microbiology results: 4/24 BCx: ngtd (collected prior to abx) 4/25 MRSA PCR +  4/26 pleural tissue: staph aureus > MRSA 4/26 pleural fluid: staph aureus > MRSA 5/2 epidural abscess: MRSA  , PharmD PGY-1 Acute Care Pharmacy Resident Office: [phone] 10/20/2020 9:07 AM  Please check [email or web address] for  unit-specific pharmacy phone numbers.

## 2020-10-20 NOTE — Plan of Care (Signed)

## 2020-10-21 LAB — AEROBIC/ANAEROBIC CULTURE W GRAM STAIN (SURGICAL/DEEP WOUND): Gram Stain: NONE SEEN

## 2020-10-21 LAB — CBC WITH DIFFERENTIAL/PLATELET
Abs Immature Granulocytes: 0.03 10*3/uL (ref 0.00–0.07)
Basophils Absolute: 0.1 10*3/uL (ref 0.0–0.1)
Basophils Relative: 1 %
Eosinophils Absolute: 0.2 10*3/uL (ref 0.0–0.5)
Eosinophils Relative: 3 %
HCT: 33.5 % — ABNORMAL LOW (ref 39.0–52.0)
Hemoglobin: 10.4 g/dL — ABNORMAL LOW (ref 13.0–17.0)
Immature Granulocytes: 1 %
Lymphocytes Relative: 36 %
Lymphs Abs: 2.3 10*3/uL (ref 0.7–4.0)
MCH: 29.6 pg (ref 26.0–34.0)
MCHC: 31 g/dL (ref 30.0–36.0)
MCV: 95.4 fL (ref 80.0–100.0)
Monocytes Absolute: 0.2 10*3/uL (ref 0.1–1.0)
Monocytes Relative: 3 %
Neutro Abs: 3.5 10*3/uL (ref 1.7–7.7)
Neutrophils Relative %: 56 %
Platelets: 359 10*3/uL (ref 150–400)
RBC: 3.51 MIL/uL — ABNORMAL LOW (ref 4.22–5.81)
RDW: 14.9 % (ref 11.5–15.5)
WBC: 6.3 10*3/uL (ref 4.0–10.5)
nRBC: 0 % (ref 0.0–0.2)

## 2020-10-21 LAB — BASIC METABOLIC PANEL
Anion gap: 7 (ref 5–15)
BUN: 22 mg/dL — ABNORMAL HIGH (ref 6–20)
CO2: 27 mmol/L (ref 22–32)
Calcium: 9.3 mg/dL (ref 8.9–10.3)
Chloride: 101 mmol/L (ref 98–111)
Creatinine, Ser: 0.92 mg/dL (ref 0.61–1.24)
GFR, Estimated: >60 mL/min (ref >60)
Glucose, Bld: 119 mg/dL — ABNORMAL HIGH (ref 70–99)
Potassium: 5 mmol/L (ref 3.5–5.1)
Sodium: 135 mmol/L (ref 135–145)

## 2020-10-21 MED ORDER — ENOXAPARIN SODIUM 80 MG/0.8ML IJ SOSY
70.0000 mg | PREFILLED_SYRINGE | INTRAMUSCULAR | Status: DC
  Administered 2020-10-21 – 2020-11-26 (×37): 70 mg via SUBCUTANEOUS
  Filled 2020-10-21 (×38): qty 0.8

## 2020-10-21 NOTE — Progress Notes (Signed)
[facility]  PROGRESS NOTE  Derek Woods OEU:[id number] DOB: 01/28/1991 DOA: 10/07/2020 PCP: Pcp, No   Brief HPI:   30 year old male with history of daily heroin IVDA, previous Suboxone use, asthma, morbid obesity, tobacco abuse, possible OSA presented to [facility] on 4/24 with complaints of back pain, right flank pain and 1 week shortness of breath.  CT of the chest revealed large right empyema.  He was taken to the OR on 4/26 with Dr. Virgel Gess for VATS and drainage of right empyema, however right thoracotomy had to be performed.  He was difficult intubation for the procedure and was unable to be weaned off from ventilator postoperatively.  There were reported difficulty with oxygenation during the procedure.  PCCM was consulted for vent management and they resumed care as primary team.  Patient was extubated on 10/10/2020 and was transferred under [facility] care on 10/11/2020.  Posterior chest tube was removed on 4/29.  Thoracotomy culture growing MRSA.  Antibiotics were changed to vancomycin.  ID was consulted on 4/30.  MRI thoracic spine was done which showed T11/T12 discitis and possible abscess.  S/p decompressive thoracic laminectomy T11/12 by neurosurgery on 10/15/2020.  Has a Hemovac.  Hep C antibody positive.    Subjective   Patient seen and examined, denies any pain.   Assessment/Plan:     1. Acute respiratory failure with hypercarbia and hypoxemia/MRSA empyema s/p right thoracotomy on 4/26-patient required intubation on 4/26 and was extubated on 4/27.  S/p thoracotomy with VATS chest tube.  One of them was removed few days ago and was removed on 10/14/2020.  Culture grew MRSA.  Patient is currently on vancomycin.  CT surgery following.  Hypoxemia resolved.  As per ID recommended 6 weeks of IV antibiotics from date of source control on 5/2 given severity of infection involving lung and spine.  Patient agreeable to complete 6 weeks of IV antibiotics in the hospital.  Tentative end date of  antibiotics is 11/27/2020.  ID indicates that if he changes his mind he decides to leave AMA they can consider alternative option like oritavancin at that time.  ID will be consulted again and is approaching end date of antibiotics for reevaluation for transition to oral antibiotics plus minus imaging.  Monitor labs as  indicated by ID.  2. 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  Hemovac was discontinued on 5/4.  Lumbar MRI showed no evidence of infection.  Neurosurgery signed off 5/5 and recommended outpatient follow-up in 2 weeks.  Antibiotic regimen per ID.  Multimodality pain control-Suboxone increased to 4 mg twice daily, continue Tylenol.    3. Anxiety-patient takes hydroxyzine.  4. Hypertension-blood pressure was elevated, amlodipine has been restarted.  Continue as needed hydralazine.  Blood pressure is now well controlled.  5. Tobacco abuse-smoking cessation counseling provided.  6. Normocytic anemia-hemoglobin is over 9.  Monitor CBC periodically.    7. Positive hepatitis C antibody-no known previous history of hepatitis C infection.  Antibody positive.  I will follow-up HCVRNA and have made follow-up appointment at our [facility] for HCV treatment on 6/21 at 8:45 AM.  8. Morbid obesity-BMI 52.2 kg/m   Scheduled medications:   . acetaminophen  1,000 mg Oral TID  . amLODipine  5 mg Oral Daily  . buprenorphine-naloxone  2 tablet Sublingual BID  . Chlorhexidine Gluconate Cloth  6 each Topical Daily  . docusate sodium  100 mg Oral BID  . mouth rinse  15 mL Mouth Rinse BID  .  nicotine  14 mg Transdermal Daily  . pantoprazole  40 mg Oral QHS  . polyethylene glycol  17 g Oral Daily  . senna-docusate  2 tablet Oral QHS  . sodium chloride flush  10-40 mL Intracatheter Q12H  . sodium chloride flush  3 mL Intravenous Q12H  . sodium chloride flush  3 mL Intravenous Q12H         Data Reviewed:    CBG:  No results for input(s): GLUCAP in the last 168 hours.  SpO2: 97 % O2 Flow Rate (L/min): 2 L/min FiO2 (%): 40 %    Vitals:   10/20/20 2310 10/21/20 0414 10/21/20 0753 10/21/20 1217  BP: 121/78 122/78 126/75 129/73  Pulse: 96 92 84 89  Resp:  19 16 17   Temp: 98.4 F (36.9 C) 98.4 F (36.9 C) 97.9 F (36.6 C) 98.3 F (36.8 C)  TempSrc: Oral Oral  Oral  SpO2: 95% 95% 96% 97%  Weight:      Height:         Intake/Output Summary (Last 24 hours) at 10/21/2020 1344 Last data filed at 10/21/2020 1218 Gross per 24 hour  Intake 400 ml  Output 2925 ml  Net -2525 ml    05/06 1901 - 05/08 0700 In: 625.9  Out: 4820 [Urine:4820]  Filed Weights   10/07/20 1006  Weight: (!) 146.7 kg    CBC:  Recent Labs  Lab 10/17/20 0500 10/18/20 0500 10/19/20 0500 10/20/20 0500 10/21/20 0534  WBC 6.7 7.1 7.9 6.4 6.3  HGB 9.5* 9.8* 10.6* 9.6* 10.4*  HCT 30.1* 30.7* 32.9* 29.9* 33.5*  PLT 305 295 351 291 359  MCV 94.7 95.3 94.0 95.2 95.4  MCH 29.9 30.4 30.3 30.6 29.6  MCHC 31.6 31.9 32.2 32.1 31.0  RDW 14.8 14.9 14.8 15.0 14.9  LYMPHSABS 2.1 1.9 2.3 1.6 2.3  MONOABS 0.3 0.2 0.2 0.2 0.2  EOSABS 0.2 0.2 0.2 0.2 0.2  BASOSABS 0.1 0.1 0.1 0.1 0.1    Complete metabolic panel:  Recent Labs  Lab 10/15/20 0353 10/17/20 0500 10/19/20 0500 10/21/20 0534  NA 136 137 133* 135  K 4.7 4.2 4.9 5.0  CL 100 105 97* 101  CO2 30 29 28 27   GLUCOSE 109* 135* 116* 119*  BUN 18 20 18  22*  CREATININE 0.82 0.80 0.83 0.92  CALCIUM 8.7* 8.6* 9.3 9.3    No results for input(s): LIPASE, AMYLASE in the last 168 hours.  No results for input(s): CRP, DDIMER, BNP, PROCALCITON, SARSCOV2NAA in the last 168 hours.  Invalid input(s): LACTICACID  ------------------------------------------------------------------------------------------------------------------ No results for input(s): CHOL, HDL, LDLCALC, TRIG, CHOLHDL, LDLDIRECT in the last 72 hours.  No results found for:  HGBA1C ------------------------------------------------------------------------------------------------------------------ No results for input(s): TSH, T4TOTAL, T3FREE, THYROIDAB in the last 72 hours.  Invalid input(s): FREET3 ------------------------------------------------------------------------------------------------------------------ No results for input(s): VITAMINB12, FOLATE, FERRITIN, TIBC, IRON, RETICCTPCT in the last 72 hours.  Coagulation profile No results for input(s): INR, PROTIME in the last 168 hours. No results for input(s): DDIMER in the last 72 hours.  Cardiac Enzymes No results for input(s): CKTOTAL, CKMB, CKMBINDEX, TROPONINI in the last 168 hours.  ------------------------------------------------------------------------------------------------------------------ No results found for: BNP   Antibiotics: Anti-infectives (From admission, onward)   Start     Dose/Rate Route Frequency Ordered Stop   10/18/20 1000  vancomycin (VANCOREADY) IVPB 1000 mg/200 mL        1,000 mg 200 mL/hr over 60 Minutes Intravenous Every 12 hours 10/17/20 1601     10/15/20 1830  ceFAZolin (  ANCEF) IVPB 2g/100 mL premix        2 g 200 mL/hr over 30 Minutes Intravenous Every 8 hours 10/15/20 1744 10/16/20 0659   10/13/20 1200  vancomycin (VANCOREADY) IVPB 2000 mg/400 mL  Status:  Discontinued        2,000 mg 200 mL/hr over 120 Minutes Intravenous Every 24 hours 10/12/20 1115 10/17/20 1559   10/12/20 1215  vancomycin (VANCOREADY) IVPB 500 mg/100 mL        500 mg 100 mL/hr over 60 Minutes Intravenous  Once 10/12/20 1115 10/12/20 1329   10/12/20 0600  vancomycin (VANCOREADY) IVPB 1500 mg/300 mL  Status:  Discontinued        1,500 mg 150 mL/hr over 120 Minutes Intravenous Every 24 hours 10/12/20 0541 10/12/20 0556   10/12/20 0600  vancomycin (VANCOREADY) IVPB 1500 mg/300 mL  Status:  Discontinued        1,500 mg 150 mL/hr over 120 Minutes Intravenous Every 18 hours 10/12/20 0556 10/12/20  1115   10/11/20 1343  vancomycin variable dose per unstable renal function (pharmacist dosing)  Status:  Discontinued         Does not apply See admin instructions 10/11/20 1344 10/12/20 0556   10/10/20 0400  vancomycin (VANCOREADY) IVPB 1250 mg/250 mL  Status:  Discontinued        1,250 mg 166.7 mL/hr over 90 Minutes Intravenous Every 8 hours 10/09/20 1835 10/11/20 1344   10/09/20 2000  vancomycin (VANCOREADY) IVPB 1250 mg/250 mL  Status:  Discontinued        1,250 mg 166.7 mL/hr over 90 Minutes Intravenous Every 8 hours 10/09/20 1117 10/09/20 1835   10/09/20 1900  vancomycin (VANCOCIN) 2,500 mg in sodium chloride 0.9 % 500 mL IVPB        2,500 mg 250 mL/hr over 120 Minutes Intravenous  Once 10/09/20 1835 10/09/20 2137   10/09/20 1200  vancomycin (VANCOCIN) 2,500 mg in sodium chloride 0.9 % 500 mL IVPB  Status:  Discontinued        2,500 mg 250 mL/hr over 120 Minutes Intravenous  Once 10/09/20 1113 10/09/20 1835   10/07/20 2200  vancomycin (VANCOREADY) IVPB 2000 mg/400 mL  Status:  Discontinued        2,000 mg 200 mL/hr over 120 Minutes Intravenous Every 12 hours 10/07/20 1251 10/07/20 1437   10/07/20 2000  ceFEPIme (MAXIPIME) 2 g in sodium chloride 0.9 % 100 mL IVPB  Status:  Discontinued        2 g 200 mL/hr over 30 Minutes Intravenous Every 8 hours 10/07/20 1251 10/11/20 1342   10/07/20 1145  ceFEPIme (MAXIPIME) 2 g in sodium chloride 0.9 % 100 mL IVPB        2 g 200 mL/hr over 30 Minutes Intravenous  Once 10/07/20 1048 10/07/20 1351   10/07/20 1100  vancomycin (VANCOCIN) 2,500 mg in sodium chloride 0.9 % 500 mL IVPB  Status:  Discontinued        2,500 mg 250 mL/hr over 120 Minutes Intravenous  Once 10/07/20 1048 10/07/20 1437       Radiology Reports  No results found.    DVT prophylaxis: SCDs  Code Status: Full code  Family Communication: No family at bedside   Consultants:  CT surgery  PCCM  ID  Procedures:  Chest tube placement, thoracotomy,  intubation  Right upper arm PICC line  S/p decompressive thoracic laminectomy T11/12 on 10/15/2020    Objective    Physical Examination:    General-appears in no acute  distress  Heart-S1-S2, regular, no murmur auscultated  Lungs-clear to auscultation bilaterally, no wheezing or crackles auscultated  Abdomen-soft, nontender, no organomegaly  Extremities-no edema in the lower extremities  Neuro-alert, oriented x3, no focal deficit noted   Status is: Inpatient  Dispo: The patient is from: Home              Anticipated d/c is to: Home              Anticipated d/c date is: 11/27/2020              Patient currently not stable for discharge  Barrier to discharge-getting IV antibiotics  COVID-19 Labs  No results for input(s): DDIMER, FERRITIN, LDH, CRP in the last 72 hours.  No results found for: SARSCOV2NAA  Microbiology  Recent Results (from the past 240 hour(s))  Aerobic/Anaerobic Culture w Gram Stain (surgical/deep wound)     Status: None (Preliminary result)   Collection Time: 10/15/20  3:01 PM   Specimen: PATH Other; Tissue  Result Value Ref Range Status   Specimen Description ABSCESS  Final   Special Requests T11 12 EPIDURAL PATIENT ON FOLLOWING VANC  Final   Gram Stain   Final    NO WBC SEEN NO ORGANISMS SEEN Performed at [facility], [address] [place], [place] [address]    Culture   Final    RARE METHICILLIN RESISTANT STAPHYLOCOCCUS AUREUS NO ANAEROBES ISOLATED; CULTURE IN PROGRESS FOR 5 DAYS    Report Status PENDING  Incomplete   Organism ID, Bacteria METHICILLIN RESISTANT STAPHYLOCOCCUS AUREUS  Final      Susceptibility   Methicillin resistant staphylococcus aureus - MIC*    CIPROFLOXACIN >=8 RESISTANT Resistant     ERYTHROMYCIN >=8 RESISTANT Resistant     GENTAMICIN <=0.5 SENSITIVE Sensitive     OXACILLIN >=4 RESISTANT Resistant     TETRACYCLINE >=16 RESISTANT Resistant     VANCOMYCIN <=0.5 SENSITIVE Sensitive     TRIMETH/SULFA <=10  SENSITIVE Sensitive     CLINDAMYCIN <=0.25 SENSITIVE Sensitive     RIFAMPIN <=0.5 SENSITIVE Sensitive     Inducible Clindamycin NEGATIVE Sensitive     * RARE METHICILLIN RESISTANT STAPHYLOCOCCUS AUREUS             Derek Woods S Derek Woods   [facility] If 7PM-7AM, please contact night-coverage at [email or web address], Office  [phone]   10/21/2020, 1:44 PM  LOS: 14 days

## 2020-10-21 NOTE — Plan of Care (Signed)

## 2020-10-21 NOTE — Progress Notes (Signed)
Physical Therapy Treatment Patient Details Name: Derek Woods MRN: [id number] DOB: 30-Oct-1990 Today's Date: 10/21/2020    History of Present Illness [name] 30 yo male who presented on 10/07/20 with shortness of breath and found to have large R empyema.  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  Medical hx includes daily heroin IVDA (previous suboxone use), asthma, morbid obesity, OSA, and smokes.    PT Comments    Pt making good progress with pain mgmt and mobility. Supervision needed for bed mobility as pt has to use momentum to get up and body habitus brings him close to EOB. Min-guard A for sit to stand with time to gain balance with initial standing. Pt ambulated 300' with RW and 4 standing rest breaks. Pt able to converse first half, unable second half due to DOE but sats dropped no lower than 89% on RA and returned to 95% with cues for pursed lip breathing. Pt encouraged to be able to tolerate increased ambulation distance. PT will continue to follow.    Follow Up Recommendations  Home health PT;Supervision for mobility/OOB     Equipment Recommendations  Rolling walker with 5" wheels;3in1 (PT) (bariatric)    Recommendations for Other Services       Precautions / Restrictions Precautions Precautions: Back;Fall Precaution Booklet Issued: No Required Braces or Orthoses:  (no brace needed order) Restrictions Weight Bearing Restrictions: No    Mobility  Bed Mobility Overal bed mobility: Needs Assistance Bed Mobility: Rolling;Sidelying to Sit Rolling: Supervision Sidelying to sit: Supervision       General bed mobility comments: supervision for safety    Transfers Overall transfer level: Needs assistance Equipment used: Rolling walker (2 wheeled) Transfers: Sit to/from Stand Sit  to Stand: Min guard         General transfer comment: no physical assist needed but pt takes a minute to gain balance once up, min guard for safety  Ambulation/Gait Ambulation/Gait assistance: Min guard Gait Distance (Feet): 300 Feet Assistive device: Rolling walker (2 wheeled) Gait Pattern/deviations: Step-to pattern;Trunk flexed;Decreased stride length;Wide base of support Gait velocity: decreased Gait velocity interpretation: 1.31 - 2.62 ft/sec, indicative of limited community ambulator General Gait Details: pt able to speak first 150', unable to talk while walking second 150' due to DOE. Sats 89% up to 95% with cues for pursed lip breathing. Pt becomes mildly impulsive when SOB, cued to stop and take break instead of rushing   Stairs             Wheelchair Mobility    Modified Rankin (Stroke Patients Only)       Balance Overall balance assessment: Needs assistance Sitting-balance support: Bilateral upper extremity supported Sitting balance-Leahy Scale: Good     Standing balance support: Bilateral upper extremity supported;During functional activity;No upper extremity supported Standing balance-Leahy Scale: Fair Standing balance comment: able to maintain static stance without support                            Cognition Arousal/Alertness: Awake/alert Behavior During Therapy: WFL for tasks assessed/performed Overall Cognitive Status: Within Functional Limits for tasks assessed  Exercises      General Comments        Pertinent Vitals/Pain Pain Assessment: Faces Faces Pain Scale: Hurts a little bit Pain Location: back Pain Descriptors / Indicators: Sore Pain Intervention(s): Monitored during session    Home Living                      Prior Function            PT Goals (current goals can now be found in the care plan section) Acute Rehab PT Goals Patient Stated Goal: to  get better PT Goal Formulation: With patient Time For Goal Achievement: 10/30/20 Potential to Achieve Goals: Good Progress towards PT goals: Progressing toward goals    Frequency    Min 5X/week      PT Plan Current plan remains appropriate    Co-evaluation              AM-PAC PT "6 Clicks" Mobility   Outcome Measure  Help needed turning from your back to your side while in a flat bed without using bedrails?: A Little Help needed moving from lying on your back to sitting on the side of a flat bed without using bedrails?: A Little Help needed moving to and from a bed to a chair (including a wheelchair)?: A Little Help needed standing up from a chair using your arms (e.g., wheelchair or bedside chair)?: A Little Help needed to walk in hospital room?: A Little Help needed climbing 3-5 steps with a railing? : A Lot 6 Click Score: 17    End of Session Equipment Utilized During Treatment: Other (comment) (unable to use gait belt due to surgical site and obesity) Activity Tolerance: Patient tolerated treatment well Patient left: in chair;with call bell/phone within reach;with chair alarm set Nurse Communication: Mobility status PT Visit Diagnosis: Other abnormalities of gait and mobility (R26.89);Muscle weakness (generalized) (M62.81);Pain Pain - part of body:  (back)     Time: 7209-4709 PT Time Calculation (min) (ACUTE ONLY): 22 min  Charges:  $Gait Training: 8-22 mins                     Lyanne Co, PT  Acute Rehab Services  Pager [phone] Office [phone]    Lawana Chambers Aryaan Persichetti 10/21/2020, 2:06 PM

## 2020-10-22 LAB — CBC WITH DIFFERENTIAL/PLATELET
Abs Immature Granulocytes: 0.01 10*3/uL (ref 0.00–0.07)
Basophils Absolute: 0 10*3/uL (ref 0.0–0.1)
Basophils Relative: 0 %
Eosinophils Absolute: 0.2 10*3/uL (ref 0.0–0.5)
Eosinophils Relative: 5 %
HCT: 29.1 % — ABNORMAL LOW (ref 39.0–52.0)
Hemoglobin: 9.2 g/dL — ABNORMAL LOW (ref 13.0–17.0)
Immature Granulocytes: 0 %
Lymphocytes Relative: 35 %
Lymphs Abs: 1.6 10*3/uL (ref 0.7–4.0)
MCH: 29.9 pg (ref 26.0–34.0)
MCHC: 31.6 g/dL (ref 30.0–36.0)
MCV: 94.5 fL (ref 80.0–100.0)
Monocytes Absolute: 0.2 10*3/uL (ref 0.1–1.0)
Monocytes Relative: 4 %
Neutro Abs: 2.5 10*3/uL (ref 1.7–7.7)
Neutrophils Relative %: 56 %
Platelets: 276 10*3/uL (ref 150–400)
RBC: 3.08 MIL/uL — ABNORMAL LOW (ref 4.22–5.81)
RDW: 14.9 % (ref 11.5–15.5)
WBC: 4.5 10*3/uL (ref 4.0–10.5)
nRBC: 0 % (ref 0.0–0.2)

## 2020-10-22 LAB — BASIC METABOLIC PANEL
Anion gap: 6 (ref 5–15)
BUN: 23 mg/dL — ABNORMAL HIGH (ref 6–20)
CO2: 26 mmol/L (ref 22–32)
Calcium: 9 mg/dL (ref 8.9–10.3)
Chloride: 102 mmol/L (ref 98–111)
Creatinine, Ser: 0.89 mg/dL (ref 0.61–1.24)
GFR, Estimated: >60 mL/min (ref >60)
Glucose, Bld: 108 mg/dL — ABNORMAL HIGH (ref 70–99)
Potassium: 4.8 mmol/L (ref 3.5–5.1)
Sodium: 134 mmol/L — ABNORMAL LOW (ref 135–145)

## 2020-10-22 NOTE — Progress Notes (Signed)
Physical Therapy Treatment Patient Details Name: Derek Woods MRN: [id number] DOB: 1990-12-04 Today's Date: 10/22/2020    History of Present Illness [name] 30 yo male who presented on 10/07/20 with shortness of breath and found to have large R empyema.  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  Medical hx includes daily heroin IVDA (previous suboxone use), asthma, morbid obesity, OSA, and smokes.    PT Comments    Pt reports feeling poorly today, but agreeable to OOB mobility. Pt's honeycomb dressing over incision was mostly off upon PT arrival to room, RN called to room to replace it. Pt ambulatory in hallway with slowed and labored gait today, but tolerates well overall from a pain standpoint. Pt with good carryover of lumbar precautions into mobility, intermittent cuing by PT to remind pt of precautions. PT to continue to follow acutely.   Follow Up Recommendations  Home health PT;Supervision for mobility/OOB     Equipment Recommendations  Rolling walker with 5" wheels;3in1 (PT) (bariatric)    Recommendations for Other Services       Precautions / Restrictions Precautions Precautions: Back;Fall Precaution Booklet Issued: No Precaution Comments: reiterated log roll technique in/out of bed, no twisting back, and bend from hips/knees when sit<>stand Required Braces or Orthoses:  (no brace needed order) Restrictions Weight Bearing Restrictions: No    Mobility  Bed Mobility Overal bed mobility: Needs Assistance Bed Mobility: Rolling;Sit to Sidelying;Sidelying to Sit Rolling: Supervision Sidelying to sit: Supervision     Sit to sidelying: Supervision General bed mobility comments: for safety, PT verbally cuing log roll technique    Transfers Overall transfer level: Needs  assistance Equipment used: Rolling walker (2 wheeled) Transfers: Sit to/from Stand Sit to Stand: Supervision         General transfer comment: for safety, verbal cuing for rising with hips and knees not back, hand placement. STS x2, from EOB and recliner.  Ambulation/Gait Ambulation/Gait assistance: Supervision Gait Distance (Feet): 150 Feet Assistive device: Rolling walker (2 wheeled) Gait Pattern/deviations: Step-through pattern;Decreased stride length;Trunk flexed Gait velocity: decr   General Gait Details: Supervision for safety, slowed gait secondary to pain and fatigue.   Stairs             Wheelchair Mobility    Modified Rankin (Stroke Patients Only)       Balance Overall balance assessment: Needs assistance Sitting-balance support: Bilateral upper extremity supported Sitting balance-Leahy Scale: Good     Standing balance support: Bilateral upper extremity supported;During functional activity;No upper extremity supported Standing balance-Leahy Scale: Fair Standing balance comment: able to maintain static stance without support                            Cognition Arousal/Alertness: Awake/alert Behavior During Therapy: WFL for tasks assessed/performed Overall Cognitive Status: Within Functional Limits for tasks assessed                                        Exercises General Exercises - Lower Extremity Long Arc Quad: AROM;Both;10 reps;Seated    General Comments        Pertinent Vitals/Pain Pain Assessment: Faces Faces Pain Scale: Hurts little more Pain Location: back Pain Descriptors /  Indicators: Sore Pain Intervention(s): Limited activity within patient's tolerance;Monitored during session;Repositioned    Home Living                      Prior Function            PT Goals (current goals can now be found in the care plan section) Acute Rehab PT Goals Patient Stated Goal: to get better PT Goal  Formulation: With patient Time For Goal Achievement: 10/30/20 Potential to Achieve Goals: Good Progress towards PT goals: Progressing toward goals    Frequency    Min 5X/week      PT Plan Current plan remains appropriate    Co-evaluation              AM-PAC PT "6 Clicks" Mobility   Outcome Measure  Help needed turning from your back to your side while in a flat bed without using bedrails?: A Little Help needed moving from lying on your back to sitting on the side of a flat bed without using bedrails?: A Little Help needed moving to and from a bed to a chair (including a wheelchair)?: A Little Help needed standing up from a chair using your arms (e.g., wheelchair or bedside chair)?: A Little Help needed to walk in hospital room?: A Little Help needed climbing 3-5 steps with a railing? : A Lot 6 Click Score: 17    End of Session   Activity Tolerance: Patient tolerated treatment well Patient left: with bed alarm set;in bed;with call bell/phone within reach Nurse Communication: Mobility status PT Visit Diagnosis: Other abnormalities of gait and mobility (R26.89);Muscle weakness (generalized) (M62.81);Pain Pain - part of body:  (back)     Time: 9767-3419 PT Time Calculation (min) (ACUTE ONLY): 27 min  Charges:  $Gait Training: 8-22 mins $Therapeutic Activity: 8-22 mins                    Marye Round, PT DPT Acute Rehabilitation Services Pager [phone]  Office [phone]   [name] 10/22/2020, 3:58 PM

## 2020-10-22 NOTE — Plan of Care (Signed)

## 2020-10-22 NOTE — Progress Notes (Signed)
PROGRESS NOTE    Derek Rubensracey Greek  ZOX:[id number] DOB: 1991/04/21 DOA: 10/07/2020 PCP: Pcp, No   Brief Narrative:  Derek Woods is a 30 y.o. M, with a pertinent pmx of daily heroin IVDA (previous suboxone use), asthma, morbid obesity (BMI 52), 1 PPD smoking, possible OSA, [facility] to [facility] on 4/24 with complaints of back pain, right flank pain, and one week with shortness of breath . CT of his chest revealed a large right empyema. [facility] by the hospitalist team.  He was taken to the OR on 4/26 with Dr. Laneta SimmersBartle for a VATS and drainage of right empyema, however a right thoracotomy had to be prefomed. He was a difficult intubation for the procedure and he was unable to be weaned from the ventilator post operatively. There was reported difficulty with oxygenation during the procedure. PCCM was consulted for ventilator management who assumed care as primary team.  Patient was extubated on 10/10/2020 and was transferred under [facility] care on 10/11/2020.  Posterior chest tube was removed on 10/12/2020.  Thoracotomy culture growing MRSA.  Antibiotics de-escalated to vancomycin.  ID consulted on 10/13/2020.  MRI thoracic spine was done which showed T11/T12 discitis and possible abscess. Status post decompressive thoracic laminectomy T11-12 by neurosurgery on 10/15/2020.  Hep C antibodies positive.  Assessment & Plan:   Principal Problem:   Empyema lung (HCC) Active Problems:   Polysubstance dependence including opioid type drug with complication, episodic abuse (HCC)   Morbid obesity with BMI of 50.0-59.9, adult (HCC)   Tobacco dependence   MRSA infection   Abscess in epidural space of thoracic spine  Acute respiratory failure with hypercarbia and hypoxia/right MRSA empyema s/p right thoracotomy on 10/09/2020/suspected OSA/OHS: Patient required intubation on 10/09/2020 and was extubated on 10/10/2020.  S/p thoracotomy as well as chest tube, one of them was removed few days ago and the other one removed  on 10/14/2020. Cultures growing MRSA.  He is on vancomycin which we will continue. Cardiothoracic surgery signed off.  Hypoxia resolved.  As per ID follow-up 5/5, recommend at least 6 weeks of IV antibiotics (vancomycin per pharmacy) from date of source control on 5/2 given severity of infection involving lung and spine.  Patient agreeable to complete 6 weeks of IV antibiotics.  Tentative end date: 11/27/2020.  ID indicates that if he changes his mind or decides to leave AMA, can consider alternative options like oritavancin at that time.  ID to be consulted again when he is approaching end date of antibiotics for reevaluation for transition to oral antibiotics +/- reimaging.  Monitor labs as indicated by ID.  As per TCTS follow-up 5/5, plan to keep staples and chest tube sutures in for 2 weeks due to morbid obesity as long as staples not making skin inflamed.  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  Hemovac discontinued 5/4.  Lumbar MRI shows no evidence of infection.  Neurosurgery signed off 5/5 and recommend outpatient follow-up in 2 weeks (patient however will still be in the hospital and they can be called back).  Antibiotic regimen per ID as noted above.  Multimodality pain control: Suboxone increased yesterday to 4 mg twice daily, changed Tylenol to 1 g 3 times daily, at patient's request-ibuprofen overnight was changed to IV Toradol as needed.  Advised patient regarding potential adverse effects of long-term NSAID use i.e. AKI, stomach ulcers etc.  Follow BMP closely and limit NSAID use to a couple days.  He  verbalizes understanding.  Pain appears to be controlled.  Anxiety: Continue as needed hydroxyzine.  Essential hypertension: Blood pressure was elevated, amlodipine was started.  Reasonably controlled.  Continue that and as needed hydralazine.  Polysubstance abuse/IVDU: Continue Suboxone.  Avoid other  opioids.  Normocytic anemia: Hemoglobin is stable over 9.  Monitor CBC periodically.  Tobacco dependence: Smoking cessation counseling provided.  Positive hepatitis C antibody: No known previous history of hepatitis C infection.  Antibodies positive.  ID will follow up HCV RNA and have made a follow-up appointment at [facility] for HCV treatment on 6/21 at 8:45 AM.  Body mass index is 52.2 kg/m./Morbid obesity.   DVT prophylaxis: SCD's Start: 10/15/20 1744 SCD's Start: 10/09/20 1716   Code Status: Full Code  Family Communication: None present at bedside.  Plan of care discussed with patient in length and he verbalized understanding and agreed with it.  Status is: Inpatient  Remains inpatient appropriate because:Inpatient level of care appropriate due to severity of illness   Dispo: The patient is from: Home              Anticipated d/c is to: Home              Patient currently is not medically stable to d/c.   Difficult to place patient: Yes  As discussed with TOC team 10/18/2020, patient does not have insurance and given history of IVDU, need for prolonged IV antibiotics, will be difficult to place to SNF.  Advised to add to the difficult to place call list.     Consultants:   CT surgery  ID  Neurosurgery  Procedures:   Chest tube placement, thoracotomy and intubation  Right upper arm PICC line.  S/p decompressive thoracic laminectomy T11-12 on 10/15/2020  Antimicrobials:  Anti-infectives (From admission, onward)   Start     Dose/Rate Route Frequency Ordered Stop   10/18/20 1000  vancomycin (VANCOREADY) IVPB 1000 mg/200 mL        1,000 mg 200 mL/hr over 60 Minutes Intravenous Every 12 hours 10/17/20 1601     10/15/20 1830  ceFAZolin (ANCEF) IVPB 2g/100 mL premix        2 g 200 mL/hr over 30 Minutes Intravenous Every 8 hours 10/15/20 1744 10/16/20 0659   10/13/20 1200  vancomycin (VANCOREADY) IVPB 2000 mg/400 mL  Status:  Discontinued        2,000 mg 200 mL/hr over  120 Minutes Intravenous Every 24 hours 10/12/20 1115 10/17/20 1559   10/12/20 1215  vancomycin (VANCOREADY) IVPB 500 mg/100 mL        500 mg 100 mL/hr over 60 Minutes Intravenous  Once 10/12/20 1115 10/12/20 1329   10/12/20 0600  vancomycin (VANCOREADY) IVPB 1500 mg/300 mL  Status:  Discontinued        1,500 mg 150 mL/hr over 120 Minutes Intravenous Every 24 hours 10/12/20 0541 10/12/20 0556   10/12/20 0600  vancomycin (VANCOREADY) IVPB 1500 mg/300 mL  Status:  Discontinued        1,500 mg 150 mL/hr over 120 Minutes Intravenous Every 18 hours 10/12/20 0556 10/12/20 1115   10/11/20 1343  vancomycin variable dose per unstable renal function (pharmacist dosing)  Status:  Discontinued         Does not apply See admin instructions 10/11/20 1344 10/12/20 0556   10/10/20 0400  vancomycin (VANCOREADY) IVPB 1250 mg/250 mL  Status:  Discontinued        1,250 mg 166.7 mL/hr over 90 Minutes Intravenous Every 8 hours 10/09/20  1835 10/11/20 1344   10/09/20 2000  vancomycin (VANCOREADY) IVPB 1250 mg/250 mL  Status:  Discontinued        1,250 mg 166.7 mL/hr over 90 Minutes Intravenous Every 8 hours 10/09/20 1117 10/09/20 1835   10/09/20 1900  vancomycin (VANCOCIN) 2,500 mg in sodium chloride 0.9 % 500 mL IVPB        2,500 mg 250 mL/hr over 120 Minutes Intravenous  Once 10/09/20 1835 10/09/20 2137   10/09/20 1200  vancomycin (VANCOCIN) 2,500 mg in sodium chloride 0.9 % 500 mL IVPB  Status:  Discontinued        2,500 mg 250 mL/hr over 120 Minutes Intravenous  Once 10/09/20 1113 10/09/20 1835   10/07/20 2200  vancomycin (VANCOREADY) IVPB 2000 mg/400 mL  Status:  Discontinued        2,000 mg 200 mL/hr over 120 Minutes Intravenous Every 12 hours 10/07/20 1251 10/07/20 1437   10/07/20 2000  ceFEPIme (MAXIPIME) 2 g in sodium chloride 0.9 % 100 mL IVPB  Status:  Discontinued        2 g 200 mL/hr over 30 Minutes Intravenous Every 8 hours 10/07/20 1251 10/11/20 1342   10/07/20 1145  ceFEPIme (MAXIPIME) 2 g in  sodium chloride 0.9 % 100 mL IVPB        2 g 200 mL/hr over 30 Minutes Intravenous  Once 10/07/20 1048 10/07/20 1351   10/07/20 1100  vancomycin (VANCOCIN) 2,500 mg in sodium chloride 0.9 % 500 mL IVPB  Status:  Discontinued        2,500 mg 250 mL/hr over 120 Minutes Intravenous  Once 10/07/20 1048 10/07/20 1437         Subjective: No complaints reported.  Did not report any pain issues.  States that he ambulated a couple of laps on the floor yesterday with PT using a walker.  Having daily BMs.  Per nursing, no acute issues reported.  Objective: Vitals:   10/22/20 0346 10/22/20 0824 10/22/20 1212 10/22/20 1657  BP: 117/71 (!) 137/92 136/86 (!) 141/86  Pulse: 75 94 95 (!) 109  Resp: 19 18 18 18   Temp: 98.4 F (36.9 C)  98.4 F (36.9 C) 98.6 F (37 C)  TempSrc: Oral     SpO2: 98% 95% 96%   Weight:      Height:        Intake/Output Summary (Last 24 hours) at 10/22/2020 1809 Last data filed at 10/22/2020 1300 Gross per 24 hour  Intake 240 ml  Output 1875 ml  Net -1635 ml   Filed Weights   10/07/20 1006  Weight: (!) 146.7 kg    Examination:  General exam: Pleasant young male, moderately built and morbidly obese lying comfortably propped up in bed without distress. Respiratory system: Clear to auscultation.  No increased work of breathing. Cardiovascular system: S1-S2 heard, RRR.  No JVD, murmurs or pedal edema.  Telemetry personally reviewed: Sinus rhythm. Gastrointestinal system: Abdomen is nondistended, soft and nontender. No organomegaly or masses felt. Normal bowel sounds heard. Central nervous system: Alert and oriented. No focal neurological deficits. Extremities: Symmetric 5 x 5 power in all extremities. Skin: No rashes, lesions or ulcers Psychiatry: Judgement and insight appear normal. Mood & affect appropriate.      Data Reviewed: I have personally reviewed following labs and imaging studies  CBC: Recent Labs  Lab 10/18/20 0500 10/19/20 0500  10/20/20 0500 10/21/20 0534 10/22/20 0612  WBC 7.1 7.9 6.4 6.3 4.5  NEUTROABS 4.7 5.1 4.4 3.5 2.5  HGB 9.8* 10.6* 9.6* 10.4* 9.2*  HCT 30.7* 32.9* 29.9* 33.5* 29.1*  MCV 95.3 94.0 95.2 95.4 94.5  PLT 295 351 291 359 276   Basic Metabolic Panel: Recent Labs  Lab 10/17/20 0500 10/19/20 0500 10/21/20 0534 10/22/20 0612  NA 137 133* 135 134*  K 4.2 4.9 5.0 4.8  CL 105 97* 101 102  CO2 29 28 27 26   GLUCOSE 135* 116* 119* 108*  BUN 20 18 22* 23*  CREATININE 0.80 0.83 0.92 0.89  CALCIUM 8.6* 9.3 9.3 9.0   GFR: Estimated Creatinine Clearance: 165 mL/min (by C-G formula based on SCr of 0.89 mg/dL). Liver Function Tests: No results for input(s): AST, ALT, ALKPHOS, BILITOT, PROT, ALBUMIN in the last 168 hours. Coagulation Profile: No results for input(s): INR, PROTIME in the last 168 hours. CBG: No results for input(s): GLUCAP in the last 168 hours.   Recent Results (from the past 240 hour(s))  Aerobic/Anaerobic Culture w Gram Stain (surgical/deep wound)     Status: None   Collection Time: 10/15/20  3:01 PM   Specimen: PATH Other; Tissue  Result Value Ref Range Status   Specimen Description ABSCESS  Final   Special Requests T11 12 EPIDURAL PATIENT ON FOLLOWING VANC  Final   Gram Stain NO WBC SEEN NO ORGANISMS SEEN   Final   Culture   Final    RARE METHICILLIN RESISTANT STAPHYLOCOCCUS AUREUS NO ANAEROBES ISOLATED Performed at [facility], [address], [place], [place]    Report Status 10/21/2020 FINAL  Final   Organism ID, Bacteria METHICILLIN RESISTANT STAPHYLOCOCCUS AUREUS  Final      Susceptibility   Methicillin resistant staphylococcus aureus - MIC*    CIPROFLOXACIN >=8 RESISTANT Resistant     ERYTHROMYCIN >=8 RESISTANT Resistant     GENTAMICIN <=0.5 SENSITIVE Sensitive     OXACILLIN >=4 RESISTANT Resistant     TETRACYCLINE >=16 RESISTANT Resistant     VANCOMYCIN <=0.5 SENSITIVE Sensitive     TRIMETH/SULFA <=10 SENSITIVE Sensitive      CLINDAMYCIN <=0.25 SENSITIVE Sensitive     RIFAMPIN <=0.5 SENSITIVE Sensitive     Inducible Clindamycin NEGATIVE Sensitive     * RARE METHICILLIN RESISTANT STAPHYLOCOCCUS AUREUS      Radiology Studies: No results found.  Scheduled Meds: . acetaminophen  1,000 mg Oral TID  . amLODipine  5 mg Oral Daily  . buprenorphine-naloxone  2 tablet Sublingual BID  . Chlorhexidine Gluconate Cloth  6 each Topical Daily  . docusate sodium  100 mg Oral BID  . enoxaparin (LOVENOX) injection  70 mg Subcutaneous Q24H  . mouth rinse  15 mL Mouth Rinse BID  . nicotine  14 mg Transdermal Daily  . pantoprazole  40 mg Oral QHS  . polyethylene glycol  17 g Oral Daily  . senna-docusate  2 tablet Oral QHS  . sodium chloride flush  10-40 mL Intracatheter Q12H  . sodium chloride flush  3 mL Intravenous Q12H  . sodium chloride flush  3 mL Intravenous Q12H   Continuous Infusions: . sodium chloride Stopped (10/16/20 1455)  . vancomycin 1,000 mg (10/22/20 1102)     LOS: 15 days   Time spent: 30 minutes   12/22/20, MD [facility]  10/22/2020, 6:09 PM   To contact the attending provider between 7A-7P or the covering provider during after hours 7P-7A, please log into the web site [email or web address].

## 2020-10-22 NOTE — Plan of Care (Signed)
  Problem: Education: Goal: Knowledge of General Education information will improve Description: Including pain rating scale, medication(s)/side effects and non-pharmacologic comfort measures 10/22/2020 1404 by Mamie Nick I, RN Outcome: Progressing 10/22/2020 1356 by Mamie Nick I, RN Outcome: Progressing   Problem: Health Behavior/Discharge Planning: Goal: Ability to manage health-related needs will improve 10/22/2020 1404 by Mamie Nick I, RN Outcome: Progressing 10/22/2020 1356 by Mamie Nick I, RN Outcome: Progressing   Problem: Clinical Measurements: Goal: Ability to maintain clinical measurements within normal limits will improve 10/22/2020 1404 by Mamie Nick I, RN Outcome: Progressing 10/22/2020 1356 by Mamie Nick I, RN Outcome: Progressing Goal: Will remain free from infection 10/22/2020 1404 by Mamie Nick I, RN Outcome: Progressing 10/22/2020 1356 by Mamie Nick I, RN Outcome: Progressing Goal: Diagnostic test results will improve 10/22/2020 1404 by Mamie Nick I, RN Outcome: Progressing 10/22/2020 1356 by Mamie Nick I, RN Outcome: Progressing Goal: Respiratory complications will improve 10/22/2020 1404 by Mamie Nick I, RN Outcome: Progressing 10/22/2020 1356 by Mamie Nick I, RN Outcome: Progressing Goal: Cardiovascular complication will be avoided 10/22/2020 1404 by Mamie Nick I, RN Outcome: Progressing 10/22/2020 1356 by Mamie Nick I, RN Outcome: Progressing   Problem: Activity: Goal: Risk for activity intolerance will decrease 10/22/2020 1404 by Mamie Nick I, RN Outcome: Progressing 10/22/2020 1356 by Mamie Nick I, RN Outcome: Progressing   Problem: Nutrition: Goal: Adequate nutrition will be maintained 10/22/2020 1404 by Mamie Nick I, RN Outcome: Progressing 10/22/2020 1356 by Mamie Nick I, RN Outcome: Progressing   Problem: Coping: Goal: Level of anxiety will  decrease 10/22/2020 1404 by Mamie Nick I, RN Outcome: Progressing 10/22/2020 1356 by Mamie Nick I, RN Outcome: Progressing   Problem: Elimination: Goal: Will not experience complications related to bowel motility 10/22/2020 1404 by Mamie Nick I, RN Outcome: Progressing 10/22/2020 1356 by Mamie Nick I, RN Outcome: Progressing Goal: Will not experience complications related to urinary retention 10/22/2020 1404 by Mamie Nick I, RN Outcome: Progressing 10/22/2020 1356 by Mamie Nick I, RN Outcome: Progressing   Problem: Pain Managment: Goal: General experience of comfort will improve 10/22/2020 1404 by Mamie Nick I, RN Outcome: Progressing 10/22/2020 1356 by Mamie Nick I, RN Outcome: Progressing   Problem: Safety: Goal: Ability to remain free from injury will improve 10/22/2020 1404 by Mamie Nick I, RN Outcome: Progressing 10/22/2020 1356 by Mamie Nick I, RN Outcome: Progressing   Problem: Skin Integrity: Goal: Risk for impaired skin integrity will decrease 10/22/2020 1404 by Mamie Nick I, RN Outcome: Progressing 10/22/2020 1356 by Mamie Nick I, RN Outcome: Progressing   Problem: Safety: Goal: Non-violent Restraint(s) 10/22/2020 1404 by Mamie Nick I, RN Outcome: Progressing 10/22/2020 1356 by Mamie Nick I, RN Outcome: Progressing

## 2020-10-23 LAB — CBC WITH DIFFERENTIAL/PLATELET
Abs Immature Granulocytes: 0.01 10*3/uL (ref 0.00–0.07)
Basophils Absolute: 0 10*3/uL (ref 0.0–0.1)
Basophils Relative: 1 %
Eosinophils Absolute: 0.2 10*3/uL (ref 0.0–0.5)
Eosinophils Relative: 4 %
HCT: 28.4 % — ABNORMAL LOW (ref 39.0–52.0)
Hemoglobin: 9 g/dL — ABNORMAL LOW (ref 13.0–17.0)
Immature Granulocytes: 0 %
Lymphocytes Relative: 34 %
Lymphs Abs: 1.4 10*3/uL (ref 0.7–4.0)
MCH: 30.1 pg (ref 26.0–34.0)
MCHC: 31.7 g/dL (ref 30.0–36.0)
MCV: 95 fL (ref 80.0–100.0)
Monocytes Absolute: 0.2 10*3/uL (ref 0.1–1.0)
Monocytes Relative: 4 %
Neutro Abs: 2.3 10*3/uL (ref 1.7–7.7)
Neutrophils Relative %: 57 %
Platelets: 287 10*3/uL (ref 150–400)
RBC: 2.99 MIL/uL — ABNORMAL LOW (ref 4.22–5.81)
RDW: 14.8 % (ref 11.5–15.5)
WBC: 4.1 10*3/uL (ref 4.0–10.5)
nRBC: 0 % (ref 0.0–0.2)

## 2020-10-23 LAB — BASIC METABOLIC PANEL
Anion gap: 5 (ref 5–15)
BUN: 22 mg/dL — ABNORMAL HIGH (ref 6–20)
CO2: 25 mmol/L (ref 22–32)
Calcium: 8.8 mg/dL — ABNORMAL LOW (ref 8.9–10.3)
Chloride: 103 mmol/L (ref 98–111)
Creatinine, Ser: 0.78 mg/dL (ref 0.61–1.24)
GFR, Estimated: >60 mL/min (ref >60)
Glucose, Bld: 125 mg/dL — ABNORMAL HIGH (ref 70–99)
Potassium: 4.4 mmol/L (ref 3.5–5.1)
Sodium: 133 mmol/L — ABNORMAL LOW (ref 135–145)

## 2020-10-23 MED ORDER — IBUPROFEN 200 MG PO TABS: 800.0000 mg | ORAL_TABLET | Freq: Three times a day (TID) | ORAL | Status: DC | PRN

## 2020-10-23 MED ORDER — LIDOCAINE 5 % EX PTCH
1.0000 | MEDICATED_PATCH | CUTANEOUS | Status: DC
  Administered 2020-10-23 – 2020-11-03 (×7): 1 via TRANSDERMAL
  Filled 2020-10-23 (×24): qty 1

## 2020-10-23 MED ORDER — IBUPROFEN 200 MG PO TABS
800.0000 mg | ORAL_TABLET | Freq: Three times a day (TID) | ORAL | Status: DC | PRN
  Administered 2020-10-23 – 2020-10-24 (×4): 800 mg via ORAL
  Filled 2020-10-23 (×4): qty 4

## 2020-10-23 NOTE — Progress Notes (Signed)
OT Cancellation Note  Patient Details Name: Derek Woods MRN: [id number] DOB: 07/18/1990   Cancelled Treatment:    Reason Eval/Treat Not Completed: Pain limiting ability to participate;Other (comment) Pt reports 8/10 pain, declining OT session until he can get pain meds, will check back as time allows for OT session.  Lenor Derrick., COTA/L Acute Rehabilitation Services [phone]   Barron Schmid 10/23/2020, 9:23 AM

## 2020-10-23 NOTE — Progress Notes (Addendum)
PROGRESS NOTE    Derek Woods  TWS:[id number] DOB: 1991-03-28 DOA: 10/07/2020 PCP: Pcp, No   Brief Narrative:  Derek Woods is a 30 y.o. M, with a pertinent pmx of daily heroin IVDA (previous suboxone use), asthma, morbid obesity (BMI 52), 1 PPD smoking, possible OSA, presented to [facility] on 4/24 with complaints of back pain, right flank pain, and one week with shortness of breath . CT of his chest revealed a large right empyema. He was admitted to [facility] by the hospitalist team.  He was taken to the OR on 4/26 with Dr. Laneta Simmers for a VATS and drainage of right empyema, however a right thoracotomy had to be prefomed. He was a difficult intubation for the procedure and he was unable to be weaned from the ventilator post operatively. There was reported difficulty with oxygenation during the procedure. PCCM was consulted for ventilator management who assumed care as primary team.  Patient was extubated on 10/10/2020 and was transferred under [facility] care on 10/11/2020.  Posterior chest tube was removed on 10/12/2020.  Thoracotomy culture growing MRSA.  Antibiotics de-escalated to vancomycin.  ID consulted on 10/13/2020.  MRI thoracic spine was done which showed T11/T12 discitis and possible abscess. Status post decompressive thoracic laminectomy T11-12 by neurosurgery on 10/15/2020.  Hep C antibodies positive.  Assessment & Plan:   Principal Problem:   Empyema lung (HCC) Active Problems:   Polysubstance dependence including opioid type drug with complication, episodic abuse (HCC)   Morbid obesity with BMI of 50.0-59.9, adult (HCC)   Tobacco dependence   MRSA infection   Abscess in epidural space of thoracic spine  Acute respiratory failure with hypercarbia and hypoxia/right MRSA empyema s/p right thoracotomy on 10/09/2020/suspected OSA/OHS: Patient required intubation on 10/09/2020 and was extubated on 10/10/2020.  S/p thoracotomy as well as chest tube, one of them was removed few days ago and the other one removed  on 10/14/2020. Cultures growing MRSA.  He is on vancomycin which we will continue. Cardiothoracic surgery signed off.  Hypoxia resolved.  As per ID follow-up 5/5, recommend at least 6 weeks of IV antibiotics (vancomycin per pharmacy) from date of source control on 5/2 given severity of infection involving lung and spine.  Patient agreeable to complete 6 weeks of IV antibiotics.  Tentative end date: 11/27/2020.  ID indicates that if he changes his mind or decides to leave AMA, can consider alternative options like oritavancin at that time.  ID to be consulted again when he is approaching end date of antibiotics for reevaluation for transition to oral antibiotics +/- reimaging.  Monitor labs as indicated by ID.  As per TCTS follow-up 5/5, plan to keep staples and chest tube sutures in for 2 weeks due to morbid obesity as long as staples not making skin inflamed.  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  Hemovac discontinued 5/4.  Lumbar MRI shows no evidence of infection.  Neurosurgery signed off 5/5 and recommend outpatient follow-up in 2 weeks (patient however will still be in the hospital and they can be called back).  Antibiotic regimen per ID as noted above.  Multimodality pain control: Suboxone increased to 4 mg twice daily, changed Tylenol to 1 g 3 times daily, completed 5 days course of as needed IV Toradol.  Asking again for IV Toradol, counseled regarding risks of AKI/ulceration.  Agreeable to change to ibuprofen as needed again to be used briefly.  Added lidocaine patch for low back pain (not  site of his surgery).  Continue Flexeril for muscle spasms which also helps.  Anxiety: Continue as needed hydroxyzine.  Essential hypertension: Blood pressure was elevated, amlodipine was started.  Reasonably controlled.  Continue that and as needed hydralazine.  Polysubstance abuse/IVDU: Continue Suboxone.  Avoid other  opioids.  Normocytic anemia: Hemoglobin is stable over 9.  Monitor CBC periodically.  Tobacco dependence: Smoking cessation counseling provided.  Positive hepatitis C antibody: No known previous history of hepatitis C infection.  Antibodies positive.  ID will follow up HCV RNA and have made a follow-up appointment at [facility] for HCV treatment on 6/21 at 8:45 AM.  Body mass index is 52.2 kg/m./Morbid obesity.   DVT prophylaxis: SCD's Start: 10/15/20 1744 SCD's Start: 10/09/20 1716 Lovenox.   Code Status: Full Code  Family Communication: None at bedside.  Status is: Inpatient  Remains inpatient appropriate because:Inpatient level of care appropriate due to severity of illness   Dispo: The patient is from: Home              Anticipated d/c is to: Home              Patient currently is not medically stable to d/c.   Difficult to place patient: Yes    Consultants:   CT surgery  ID  Neurosurgery  Procedures:   Chest tube placement, thoracotomy and intubation  Right upper arm PICC line.  S/p decompressive thoracic laminectomy T11-12 on 10/15/2020  Antimicrobials:  Anti-infectives (From admission, onward)   Start     Dose/Rate Route Frequency Ordered Stop   10/18/20 1000  vancomycin (VANCOREADY) IVPB 1000 mg/200 mL        1,000 mg 200 mL/hr over 60 Minutes Intravenous Every 12 hours 10/17/20 1601     10/15/20 1830  ceFAZolin (ANCEF) IVPB 2g/100 mL premix        2 g 200 mL/hr over 30 Minutes Intravenous Every 8 hours 10/15/20 1744 10/16/20 0659   10/13/20 1200  vancomycin (VANCOREADY) IVPB 2000 mg/400 mL  Status:  Discontinued        2,000 mg 200 mL/hr over 120 Minutes Intravenous Every 24 hours 10/12/20 1115 10/17/20 1559   10/12/20 1215  vancomycin (VANCOREADY) IVPB 500 mg/100 mL        500 mg 100 mL/hr over 60 Minutes Intravenous  Once 10/12/20 1115 10/12/20 1329   10/12/20 0600  vancomycin (VANCOREADY) IVPB 1500 mg/300 mL  Status:  Discontinued        1,500 mg 150  mL/hr over 120 Minutes Intravenous Every 24 hours 10/12/20 0541 10/12/20 0556   10/12/20 0600  vancomycin (VANCOREADY) IVPB 1500 mg/300 mL  Status:  Discontinued        1,500 mg 150 mL/hr over 120 Minutes Intravenous Every 18 hours 10/12/20 0556 10/12/20 1115   10/11/20 1343  vancomycin variable dose per unstable renal function (pharmacist dosing)  Status:  Discontinued         Does not apply See admin instructions 10/11/20 1344 10/12/20 0556   10/10/20 0400  vancomycin (VANCOREADY) IVPB 1250 mg/250 mL  Status:  Discontinued        1,250 mg 166.7 mL/hr over 90 Minutes Intravenous Every 8 hours 10/09/20 1835 10/11/20 1344   10/09/20 2000  vancomycin (VANCOREADY) IVPB 1250 mg/250 mL  Status:  Discontinued        1,250 mg 166.7 mL/hr over 90 Minutes Intravenous Every 8 hours 10/09/20 1117 10/09/20 1835   10/09/20 1900  vancomycin (VANCOCIN) 2,500 mg in sodium chloride 0.9 %  500 mL IVPB        2,500 mg 250 mL/hr over 120 Minutes Intravenous  Once 10/09/20 1835 10/09/20 2137   10/09/20 1200  vancomycin (VANCOCIN) 2,500 mg in sodium chloride 0.9 % 500 mL IVPB  Status:  Discontinued        2,500 mg 250 mL/hr over 120 Minutes Intravenous  Once 10/09/20 1113 10/09/20 1835   10/07/20 2200  vancomycin (VANCOREADY) IVPB 2000 mg/400 mL  Status:  Discontinued        2,000 mg 200 mL/hr over 120 Minutes Intravenous Every 12 hours 10/07/20 1251 10/07/20 1437   10/07/20 2000  ceFEPIme (MAXIPIME) 2 g in sodium chloride 0.9 % 100 mL IVPB  Status:  Discontinued        2 g 200 mL/hr over 30 Minutes Intravenous Every 8 hours 10/07/20 1251 10/11/20 1342   10/07/20 1145  ceFEPIme (MAXIPIME) 2 g in sodium chloride 0.9 % 100 mL IVPB        2 g 200 mL/hr over 30 Minutes Intravenous  Once 10/07/20 1048 10/07/20 1351   10/07/20 1100  vancomycin (VANCOCIN) 2,500 mg in sodium chloride 0.9 % 500 mL IVPB  Status:  Discontinued        2,500 mg 250 mL/hr over 120 Minutes Intravenous  Once 10/07/20 1048 10/07/20 1437          Subjective: Seemed somewhat upset that his Toradol ran out last night.  Advised him that he completed a 5 days course as was discussed when it was started.  Agreeable to change to brief Motrin by mouth and lidocaine patch.  Having regular BMs.  Objective: Vitals:   10/23/20 0322 10/23/20 0700 10/23/20 1015 10/23/20 1100  BP: 117/70 124/72 126/90 130/83  Pulse: 93 95  100  Resp: 18 20  16   Temp: 99.3 F (37.4 C) 99 F (37.2 C)  98.7 F (37.1 C)  TempSrc: Oral Axillary  Axillary  SpO2: 96% 97%  97%  Weight:      Height:        Intake/Output Summary (Last 24 hours) at 10/23/2020 1401 Last data filed at 10/23/2020 1044 Gross per 24 hour  Intake 240 ml  Output 1600 ml  Net -1360 ml   Filed Weights   10/07/20 1006  Weight: (!) 146.7 kg    Examination:  General exam: Pleasant young male, moderately built and morbidly obese lying comfortably propped up in bed without distress. Respiratory system: Clear to auscultation.  No increased work of breathing. Cardiovascular system: S1-S2 heard, RRR.  No JVD, murmurs or pedal edema.  Telemetry personally reviewed: Sinus rhythm. Gastrointestinal system: Abdomen is nondistended, soft and nontender. No organomegaly or masses felt. Normal bowel sounds heard. Central nervous system: Alert and oriented. No focal neurological deficits. Extremities: Symmetric 5 x 5 power in all extremities. Skin: Mid back surgical site dressing clean and dry.  Some tenderness in the mid lower lumbar spine, reports chronic pain there. Psychiatry: Judgement and insight appear normal. Mood & affect appropriate.      Data Reviewed: I have personally reviewed following labs and imaging studies  CBC: Recent Labs  Lab 10/19/20 0500 10/20/20 0500 10/21/20 0534 10/22/20 0612 10/23/20 0430  WBC 7.9 6.4 6.3 4.5 4.1  NEUTROABS 5.1 4.4 3.5 2.5 2.3  HGB 10.6* 9.6* 10.4* 9.2* 9.0*  HCT 32.9* 29.9* 33.5* 29.1* 28.4*  MCV 94.0 95.2 95.4 94.5 95.0  PLT 351 291  359 276 287   Basic Metabolic Panel: Recent Labs  Lab 10/17/20  0500 10/19/20 0500 10/21/20 0534 10/22/20 0612 10/23/20 0430  NA 137 133* 135 134* 133*  K 4.2 4.9 5.0 4.8 4.4  CL 105 97* 101 102 103  CO2 29 28 27 26 25   GLUCOSE 135* 116* 119* 108* 125*  BUN 20 18 22* 23* 22*  CREATININE 0.80 0.83 0.92 0.89 0.78  CALCIUM 8.6* 9.3 9.3 9.0 8.8*   GFR: Estimated Creatinine Clearance: 183.6 mL/min (by C-G formula based on SCr of 0.78 mg/dL). Liver Function Tests: No results for input(s): AST, ALT, ALKPHOS, BILITOT, PROT, ALBUMIN in the last 168 hours. Coagulation Profile: No results for input(s): INR, PROTIME in the last 168 hours. CBG: No results for input(s): GLUCAP in the last 168 hours.   Recent Results (from the past 240 hour(s))  Aerobic/Anaerobic Culture w Gram Stain (surgical/deep wound)     Status: None   Collection Time: 10/15/20  3:01 PM   Specimen: PATH Other; Tissue  Result Value Ref Range Status   Specimen Description ABSCESS  Final   Special Requests T11 12 EPIDURAL PATIENT ON FOLLOWING VANC  Final   Gram Stain NO WBC SEEN NO ORGANISMS SEEN   Final   Culture   Final    RARE METHICILLIN RESISTANT STAPHYLOCOCCUS AUREUS NO ANAEROBES ISOLATED Performed at [facility], [address], [place], [place]    Report Status 10/21/2020 FINAL  Final   Organism ID, Bacteria METHICILLIN RESISTANT STAPHYLOCOCCUS AUREUS  Final      Susceptibility   Methicillin resistant staphylococcus aureus - MIC*    CIPROFLOXACIN >=8 RESISTANT Resistant     ERYTHROMYCIN >=8 RESISTANT Resistant     GENTAMICIN <=0.5 SENSITIVE Sensitive     OXACILLIN >=4 RESISTANT Resistant     TETRACYCLINE >=16 RESISTANT Resistant     VANCOMYCIN <=0.5 SENSITIVE Sensitive     TRIMETH/SULFA <=10 SENSITIVE Sensitive     CLINDAMYCIN <=0.25 SENSITIVE Sensitive     RIFAMPIN <=0.5 SENSITIVE Sensitive     Inducible Clindamycin NEGATIVE Sensitive     * RARE METHICILLIN RESISTANT  STAPHYLOCOCCUS AUREUS      Radiology Studies: No results found.  Scheduled Meds: . acetaminophen  1,000 mg Oral TID  . amLODipine  5 mg Oral Daily  . buprenorphine-naloxone  2 tablet Sublingual BID  . Chlorhexidine Gluconate Cloth  6 each Topical Daily  . docusate sodium  100 mg Oral BID  . enoxaparin (LOVENOX) injection  70 mg Subcutaneous Q24H  . lidocaine  1 patch Transdermal Q24H  . mouth rinse  15 mL Mouth Rinse BID  . nicotine  14 mg Transdermal Daily  . pantoprazole  40 mg Oral QHS  . polyethylene glycol  17 g Oral Daily  . senna-docusate  2 tablet Oral QHS  . sodium chloride flush  10-40 mL Intracatheter Q12H  . sodium chloride flush  3 mL Intravenous Q12H  . sodium chloride flush  3 mL Intravenous Q12H   Continuous Infusions: . sodium chloride Stopped (10/16/20 1455)  . vancomycin 1,000 mg (10/23/20 1025)     LOS: 16 days   Time spent: 30 minutes   12/23/20, MD [facility]  10/23/2020, 2:01 PM   To contact the attending provider between 7A-7P or the covering provider during after hours 7P-7A, please log into the web site [email or web address].

## 2020-10-23 NOTE — Progress Notes (Signed)
PT Cancellation Note  Patient Details Name: Derek Woods MRN: [id number] DOB: [birth year]   Cancelled Treatment:    Reason Eval/Treat Not Completed: Patient declined, no reason specified Pt reporting he does not feel like participating this morning. Will hold and follow up as schedule allows.   Farley Ly, PT, DPT  Acute Rehabilitation Services  Pager: [phone] Office: [phone]    Lehman Prom 10/23/2020, 11:09 AM

## 2020-10-23 NOTE — Plan of Care (Signed)

## 2020-10-23 NOTE — Plan of Care (Signed)
  Problem: Education: Goal: Knowledge of General Education information will improve Description: Including pain rating scale, medication(s)/side effects and non-pharmacologic comfort measures 10/23/2020 1728 by Deliah Boston, RN Outcome: Progressing 10/23/2020 1728 by Deliah Boston, RN Outcome: Progressing   Problem: Health Behavior/Discharge Planning: Goal: Ability to manage health-related needs will improve 10/23/2020 1728 by Deliah Boston, RN Outcome: Progressing 10/23/2020 1728 by Deliah Boston, RN Outcome: Progressing   Problem: Clinical Measurements: Goal: Ability to maintain clinical measurements within normal limits will improve 10/23/2020 1728 by Deliah Boston, RN Outcome: Progressing 10/23/2020 1728 by Deliah Boston, RN Outcome: Progressing Goal: Will remain free from infection 10/23/2020 1728 by Deliah Boston, RN Outcome: Progressing 10/23/2020 1728 by Deliah Boston, RN Outcome: Progressing Goal: Diagnostic test results will improve 10/23/2020 1728 by Deliah Boston, RN Outcome: Progressing 10/23/2020 1728 by Deliah Boston, RN Outcome: Progressing Goal: Respiratory complications will improve 10/23/2020 1728 by Deliah Boston, RN Outcome: Progressing 10/23/2020 1728 by Deliah Boston, RN Outcome: Progressing Goal: Cardiovascular complication will be avoided 10/23/2020 1728 by Deliah Boston, RN Outcome: Progressing 10/23/2020 1728 by Deliah Boston, RN Outcome: Progressing   Problem: Activity: Goal: Risk for activity intolerance will decrease 10/23/2020 1728 by Deliah Boston, RN Outcome: Progressing 10/23/2020 1728 by Deliah Boston, RN Outcome: Progressing   Problem: Nutrition: Goal: Adequate nutrition will be maintained 10/23/2020 1728 by Deliah Boston, RN Outcome: Progressing 10/23/2020 1728 by Deliah Boston, RN Outcome: Progressing   Problem: Coping: Goal: Level of anxiety will decrease 10/23/2020 1728 by Deliah Boston, RN Outcome: Progressing 10/23/2020 1728 by Deliah Boston, RN Outcome: Progressing   Problem: Elimination: Goal: Will not experience complications related to bowel motility 10/23/2020 1728 by Deliah Boston, RN Outcome: Progressing 10/23/2020 1728 by Deliah Boston, RN Outcome: Progressing Goal: Will not experience complications related to urinary retention 10/23/2020 1728 by Deliah Boston, RN Outcome: Progressing 10/23/2020 1728 by Deliah Boston, RN Outcome: Progressing   Problem: Pain Managment: Goal: General experience of comfort will improve 10/23/2020 1728 by Deliah Boston, RN Outcome: Progressing 10/23/2020 1728 by Deliah Boston, RN Outcome: Progressing   Problem: Safety: Goal: Ability to remain free from injury will improve 10/23/2020 1728 by Deliah Boston, RN Outcome: Progressing 10/23/2020 1728 by Deliah Boston, RN Outcome: Progressing   Problem: Skin Integrity: Goal: Risk for impaired skin integrity will decrease 10/23/2020 1728 by Deliah Boston, RN Outcome: Progressing 10/23/2020 1728 by Deliah Boston, RN Outcome: Progressing   Problem: Safety: Goal: Non-violent Restraint(s) 10/23/2020 1728 by Deliah Boston, RN Outcome: Progressing 10/23/2020 1728 by Deliah Boston, RN Outcome: Progressing

## 2020-10-24 ENCOUNTER — Inpatient Hospital Stay: Payer: Self-pay | Admitting: Internal Medicine

## 2020-10-24 LAB — HEPATIC FUNCTION PANEL
ALT: 20 U/L (ref 0–44)
AST: 26 U/L (ref 15–41)
Albumin: 2.8 g/dL — ABNORMAL LOW (ref 3.5–5.0)
Alkaline Phosphatase: 108 U/L (ref 38–126)
Bilirubin, Direct: <0.1 mg/dL (ref 0.0–0.2)
Total Bilirubin: 0.6 mg/dL (ref 0.3–1.2)
Total Protein: 7.2 g/dL (ref 6.5–8.1)

## 2020-10-24 LAB — CBC WITH DIFFERENTIAL/PLATELET
Abs Immature Granulocytes: 0.01 10*3/uL (ref 0.00–0.07)
Basophils Absolute: 0 10*3/uL (ref 0.0–0.1)
Basophils Relative: 0 %
Eosinophils Absolute: 0.2 10*3/uL (ref 0.0–0.5)
Eosinophils Relative: 5 %
HCT: 32.4 % — ABNORMAL LOW (ref 39.0–52.0)
Hemoglobin: 10.3 g/dL — ABNORMAL LOW (ref 13.0–17.0)
Immature Granulocytes: 0 %
Lymphocytes Relative: 36 %
Lymphs Abs: 1.8 10*3/uL (ref 0.7–4.0)
MCH: 29.9 pg (ref 26.0–34.0)
MCHC: 31.8 g/dL (ref 30.0–36.0)
MCV: 93.9 fL (ref 80.0–100.0)
Monocytes Absolute: 0.1 10*3/uL (ref 0.1–1.0)
Monocytes Relative: 3 %
Neutro Abs: 2.7 10*3/uL (ref 1.7–7.7)
Neutrophils Relative %: 56 %
Platelets: 320 10*3/uL (ref 150–400)
RBC: 3.45 MIL/uL — ABNORMAL LOW (ref 4.22–5.81)
RDW: 14.7 % (ref 11.5–15.5)
WBC: 4.9 10*3/uL (ref 4.0–10.5)
nRBC: 0 % (ref 0.0–0.2)

## 2020-10-24 LAB — VANCOMYCIN, TROUGH: Vancomycin Tr: 12 ug/mL — ABNORMAL LOW (ref 15–20)

## 2020-10-24 LAB — BASIC METABOLIC PANEL
Anion gap: 7 (ref 5–15)
BUN: 18 mg/dL (ref 6–20)
CO2: 25 mmol/L (ref 22–32)
Calcium: 9.3 mg/dL (ref 8.9–10.3)
Chloride: 103 mmol/L (ref 98–111)
Creatinine, Ser: 0.87 mg/dL (ref 0.61–1.24)
GFR, Estimated: >60 mL/min (ref >60)
Glucose, Bld: 126 mg/dL — ABNORMAL HIGH (ref 70–99)
Potassium: 4.6 mmol/L (ref 3.5–5.1)
Sodium: 135 mmol/L (ref 135–145)

## 2020-10-24 NOTE — Progress Notes (Signed)
PROGRESS NOTE    Derek Woods  ZOX:[id number] DOB: 1990-11-16 DOA: 10/07/2020 PCP: Pcp, No   Brief Narrative:  Derek Woods is a 30 y.o. M, with a pertinent pmx of daily heroin IVDA (previous suboxone use), asthma, morbid obesity (BMI 52), 1 PPD smoking, possible OSA, [facility] with complaints of back pain, right flank pain, and one week with shortness of breath . CT of his chest revealed a large right empyema. [facility] by the hospitalist team.  He was taken to the OR on 4/26 with Dr. Laneta SimmersBartle for a VATS and drainage of right empyema, however a right thoracotomy had to be prefomed. He was a difficult intubation for the procedure and he was unable to be weaned from the ventilator post operatively. There was reported difficulty with oxygenation during the procedure. PCCM was consulted for ventilator management who assumed care as primary team.  Patient was extubated on 10/10/2020 and was transferred under [facility] on 10/11/2020.  Posterior chest tube was removed on 10/12/2020.  Thoracotomy culture growing MRSA.  Antibiotics de-escalated to vancomycin.  ID consulted on 10/13/2020.  MRI thoracic spine was done which showed T11/T12 discitis and possible abscess. Status post decompressive thoracic laminectomy T11-12 by neurosurgery on 10/15/2020.  Hep C antibodies positive.   Assessment & Plan:  Acute respiratory failure with hypercarbia and hypoxia/right MRSA empyema s/p right thoracotomy on 10/09/2020/suspected OSA/OHS  Patient required intubation on 10/09/2020 and was extubated on 10/10/2020.  S/p thoracotomy as well as chest tube.  Cardiothoracic surgery has been following.  Chest tubes were removed.  Sutures to be removed today.  Currently saturating normal on room air. Cultures growing MRSA.  He is on vancomycin which we will continue. As per ID follow-up 5/5, recommend at least 6 weeks of IV antibiotics (vancomycin per pharmacy) from date of source control on 5/2 given severity  of infection involving lung and spine.  Patient agreeable to complete 6 weeks of IV antibiotics.  Tentative end date: 11/27/2020.  ID indicates that if he changes his mind or decides to leave AMA, can consider alternative options like oritavancin at that time.  ID to be consulted again when he is approaching end date of antibiotics for reevaluation for transition to oral antibiotics +/- reimaging.    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  Hemovac discontinued 5/4.  Lumbar MRI shows no evidence of infection.  Neurosurgery signed off 5/5 and recommend outpatient follow-up in 2 weeks (patient however will still be in the hospital and they can be called back).  Antibiotic regimen per ID as noted above.   Multimodality pain control: Suboxone increased to 4 mg twice daily, changed Tylenol to 1 g 3 times daily. Completed 5 days course of as needed IV Toradol.  Ibuprofen was added.  Topical lidocaine.   Continue Flexeril for muscle spasms which also helps.  Anxiety Continue as needed hydroxyzine.  Essential hypertension Blood pressure was elevated, amlodipine was started.  Reasonably controlled.    Polysubstance abuse/IVDU Continue Suboxone.  Avoid other opioids.  Normocytic anemia Hemoglobin is stable over 9.  Monitor CBC periodically.  Tobacco dependence Smoking cessation counseling provided.  Positive hepatitis C antibody No known previous history of hepatitis C infection.  Antibodies positive.  ID will follow up HCV RNA and have [facility] on 6/21 at 8:45 AM.  Morbid obesity Estimated body mass index is 52.2 kg/m  as calculated from the following:   Height as of this encounter: 5\' 6"  (1.676 m).   Weight as of this encounter: 146.7 kg.   DVT prophylaxis: Lovenox   Code Status: Full Code  Family Communication: None at bedside. Disposition: We will  need to stay in the hospital till completion of IV antibiotic course.   Status is: Inpatient  Remains inpatient appropriate because:Inpatient level of care appropriate due to severity of illness   Dispo: The patient is from: Home              Anticipated d/c is to: Home              Patient currently is not medically stable to d/c.   Difficult to place patient: Yes    Consultants:   CT surgery  ID  Neurosurgery  Procedures:   Chest tube placement, thoracotomy and intubation  Right upper arm PICC line.  S/p decompressive thoracic laminectomy T11-12 on 10/15/2020  Antimicrobials:  Anti-infectives (From admission, onward)   Start     Dose/Rate Route Frequency Ordered Stop   10/18/20 1000  vancomycin (VANCOREADY) IVPB 1000 mg/200 mL        1,000 mg 200 mL/hr over 60 Minutes Intravenous Every 12 hours 10/17/20 1601     10/15/20 1830  ceFAZolin (ANCEF) IVPB 2g/100 mL premix        2 g 200 mL/hr over 30 Minutes Intravenous Every 8 hours 10/15/20 1744 10/16/20 0659   10/13/20 1200  vancomycin (VANCOREADY) IVPB 2000 mg/400 mL  Status:  Discontinued        2,000 mg 200 mL/hr over 120 Minutes Intravenous Every 24 hours 10/12/20 1115 10/17/20 1559   10/12/20 1215  vancomycin (VANCOREADY) IVPB 500 mg/100 mL        500 mg 100 mL/hr over 60 Minutes Intravenous  Once 10/12/20 1115 10/12/20 1329   10/12/20 0600  vancomycin (VANCOREADY) IVPB 1500 mg/300 mL  Status:  Discontinued        1,500 mg 150 mL/hr over 120 Minutes Intravenous Every 24 hours 10/12/20 0541 10/12/20 0556   10/12/20 0600  vancomycin (VANCOREADY) IVPB 1500 mg/300 mL  Status:  Discontinued        1,500 mg 150 mL/hr over 120 Minutes Intravenous Every 18 hours 10/12/20 0556 10/12/20 1115   10/11/20 1343  vancomycin variable dose per unstable renal function (pharmacist dosing)  Status:  Discontinued         Does not apply See admin instructions 10/11/20 1344 10/12/20 0556   10/10/20 0400  vancomycin (VANCOREADY) IVPB  1250 mg/250 mL  Status:  Discontinued        1,250 mg 166.7 mL/hr over 90 Minutes Intravenous Every 8 hours 10/09/20 1835 10/11/20 1344   10/09/20 2000  vancomycin (VANCOREADY) IVPB 1250 mg/250 mL  Status:  Discontinued        1,250 mg 166.7 mL/hr over 90 Minutes Intravenous Every 8 hours 10/09/20 1117 10/09/20 1835   10/09/20 1900  vancomycin (VANCOCIN) 2,500 mg in sodium chloride 0.9 % 500 mL IVPB        2,500 mg 250 mL/hr over 120 Minutes Intravenous  Once 10/09/20 1835 10/09/20 2137   10/09/20 1200  vancomycin (VANCOCIN) 2,500 mg in sodium chloride 0.9 % 500 mL IVPB  Status:  Discontinued        2,500 mg 250 mL/hr over 120 Minutes Intravenous  Once 10/09/20 1113 10/09/20 1835   10/07/20 2200  vancomycin (VANCOREADY) IVPB 2000 mg/400 mL  Status:  Discontinued        2,000 mg 200 mL/hr over 120 Minutes Intravenous Every 12 hours 10/07/20 1251 10/07/20 1437   10/07/20 2000  ceFEPIme (MAXIPIME) 2 g in sodium chloride 0.9 % 100 mL IVPB  Status:  Discontinued        2 g 200 mL/hr over 30 Minutes Intravenous Every 8 hours 10/07/20 1251 10/11/20 1342   10/07/20 1145  ceFEPIme (MAXIPIME) 2 g in sodium chloride 0.9 % 100 mL IVPB        2 g 200 mL/hr over 30 Minutes Intravenous  Once 10/07/20 1048 10/07/20 1351   10/07/20 1100  vancomycin (VANCOCIN) 2,500 mg in sodium chloride 0.9 % 500 mL IVPB  Status:  Discontinued        2,500 mg 250 mL/hr over 120 Minutes Intravenous  Once 10/07/20 1048 10/07/20 1437         Subjective: Patient denies any complaints.  Wondering about his sutures on his right chest.  Having regular bowel movements.  Objective: Vitals:   10/23/20 1954 10/23/20 2306 10/24/20 0352 10/24/20 0841  BP: 133/81 130/82 128/88 120/74  Pulse: 93 99 98 83  Resp: 19 19 18    Temp: 98.5 F (36.9 C) 98.3 F (36.8 C) 98 F (36.7 C) 98.5 F (36.9 C)  TempSrc: Oral Oral Oral Oral  SpO2: 97% 95% 97% 94%  Weight:      Height:        Intake/Output Summary (Last 24 hours) at  10/24/2020 1103 Last data filed at 10/24/2020 0352 Gross per 24 hour  Intake 720 ml  Output 2171 ml  Net -1451 ml   Filed Weights   10/07/20 1006  Weight: (!) 146.7 kg    Examination:  General appearance: Awake alert.  In no distress Resp: Clear to auscultation bilaterally.  Normal effort Sutures noted over the right chest wall area.  No erythema noted.  No fluctuation. Cardio: S1-S2 is normal regular.  No S3-S4.  No rubs murmurs or bruit GI: Abdomen is soft.  Nontender nondistended.  Bowel sounds are present normal.  No masses organomegaly Extremities: No edema.  Full range of motion of lower extremities. Neurologic: Alert and oriented x3.  No focal neurological deficits.       Data Reviewed: I have personally reviewed following labs and imaging studies  CBC: Recent Labs  Lab 10/20/20 0500 10/21/20 0534 10/22/20 0612 10/23/20 0430 10/24/20 0415  WBC 6.4 6.3 4.5 4.1 4.9  NEUTROABS 4.4 3.5 2.5 2.3 2.7  HGB 9.6* 10.4* 9.2* 9.0* 10.3*  HCT 29.9* 33.5* 29.1* 28.4* 32.4*  MCV 95.2 95.4 94.5 95.0 93.9  PLT 291 359 276 287 320   Basic Metabolic Panel: Recent Labs  Lab 10/19/20 0500 10/21/20 0534 10/22/20 0612 10/23/20 0430 10/24/20 0415  NA 133* 135 134* 133* 135  K 4.9 5.0 4.8 4.4 4.6  CL 97* 101 102 103 103  CO2 28 27 26 25 25   GLUCOSE 116* 119* 108* 125* 126*  BUN 18 22* 23* 22* 18  CREATININE 0.83 0.92 0.89 0.78 0.87  CALCIUM 9.3 9.3 9.0 8.8* 9.3   GFR: Estimated Creatinine Clearance: 168.8 mL/min (by C-G formula based on SCr of 0.87 mg/dL). Liver Function Tests: Recent Labs  Lab 10/24/20 0415  AST 26  ALT 20  ALKPHOS 108  BILITOT 0.6  PROT 7.2  ALBUMIN 2.8*     Recent Results (from the past 240 hour(s))  Aerobic/Anaerobic Culture w Gram Stain (surgical/deep wound)     Status: None  Collection Time: 10/15/20  3:01 PM   Specimen: PATH Other; Tissue  Result Value Ref Range Status   Specimen Description ABSCESS  Final   Special Requests T11 12  EPIDURAL PATIENT ON FOLLOWING VANC  Final   Gram Stain NO WBC SEEN NO ORGANISMS SEEN   Final   Culture   Final    RARE METHICILLIN RESISTANT STAPHYLOCOCCUS AUREUS NO ANAEROBES ISOLATED Performed at [facility], [address] [place], [place] [address]    Report Status 10/21/2020 FINAL  Final   Organism ID, Bacteria METHICILLIN RESISTANT STAPHYLOCOCCUS AUREUS  Final      Susceptibility   Methicillin resistant staphylococcus aureus - MIC*    CIPROFLOXACIN >=8 RESISTANT Resistant     ERYTHROMYCIN >=8 RESISTANT Resistant     GENTAMICIN <=0.5 SENSITIVE Sensitive     OXACILLIN >=4 RESISTANT Resistant     TETRACYCLINE >=16 RESISTANT Resistant     VANCOMYCIN <=0.5 SENSITIVE Sensitive     TRIMETH/SULFA <=10 SENSITIVE Sensitive     CLINDAMYCIN <=0.25 SENSITIVE Sensitive     RIFAMPIN <=0.5 SENSITIVE Sensitive     Inducible Clindamycin NEGATIVE Sensitive     * RARE METHICILLIN RESISTANT STAPHYLOCOCCUS AUREUS      Radiology Studies: No results found.  Scheduled Meds: . acetaminophen  1,000 mg Oral TID  . amLODipine  5 mg Oral Daily  . buprenorphine-naloxone  2 tablet Sublingual BID  . Chlorhexidine Gluconate Cloth  6 each Topical Daily  . docusate sodium  100 mg Oral BID  . enoxaparin (LOVENOX) injection  70 mg Subcutaneous Q24H  . lidocaine  1 patch Transdermal Q24H  . mouth rinse  15 mL Mouth Rinse BID  . nicotine  14 mg Transdermal Daily  . pantoprazole  40 mg Oral QHS  . polyethylene glycol  17 g Oral Daily  . senna-docusate  2 tablet Oral QHS  . sodium chloride flush  10-40 mL Intracatheter Q12H  . sodium chloride flush  3 mL Intravenous Q12H  . sodium chloride flush  3 mL Intravenous Q12H   Continuous Infusions: . sodium chloride Stopped (10/16/20 1455)  . vancomycin 1,000 mg (10/23/20 2112)     LOS: 17 days     Osvaldo Shipper, MD [facility]  10/24/2020, 11:03 AM   To contact the attending provider between 7A-7P or the covering provider during  after hours 7P-7A, please log into the web site [email or web address].

## 2020-10-24 NOTE — Progress Notes (Addendum)
Occupational Therapy Treatment Patient Details Name: Derek Woods MRN: [id number] DOB: 25-Mar-1991 Today's Date: 10/24/2020    History of present illness [name] 30 yo male who presented on 10/07/20 with shortness of breath and found to have large R empyema.  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  Medical hx includes daily heroin IVDA (previous suboxone use), asthma, morbid obesity, OSA, and smokes.   OT comments  Patient is demonstrating nice progression with all patient focused OT goals.  Patient able to use hip it provided, and is needing up to Min A for lower body ADL, and setup only for grooming.  The patient was able to walk in the room at [facility] level and supervision only.  Patient stating that his pain is much more controlled today, and he believes he can do more.  Patient is requesting to take a shower, OT will check with nursing given PICC.  SNF has been recommended, but if he continues to improve, home and home health could be possible.    Follow Up Recommendations  Home Health OT    Equipment Recommendations  3 in 1 bedside commode    Recommendations for Other Services      Precautions / Restrictions Precautions Precautions: Back;Fall Precaution Comments: continued log roll technique in/out of bed, no twisting back, and bend from hips/knees when sit<>stand Restrictions Weight Bearing Restrictions: No       Mobility Bed Mobility Overal bed mobility: Needs Assistance Bed Mobility: Supine to Sit;Sit to Supine     Supine to sit: HOB elevated;Supervision Sit to supine: Supervision;HOB elevated        Transfers Overall transfer level: Needs assistance Equipment used: Rolling walker (2 wheeled) Transfers: Sit to/from Stand Sit to Stand: Supervision              Balance  Overall balance assessment: Needs assistance Sitting-balance support: Feet supported Sitting balance-Leahy Scale: Good     Standing balance support: Bilateral upper extremity supported Standing balance-Leahy Scale: Fair Standing balance comment: able to maintain static stance without support                           ADL either performed or assessed with clinical judgement   ADL Overall ADL's : Needs assistance/impaired     Grooming: Set up;Sitting       Lower Body Bathing: Minimal assistance;With adaptive equipment;Sit to/from stand   Upper Body Dressing : Set up;Sitting   Lower Body Dressing: Minimal assistance;Sit to/from stand;With adaptive equipment   Toilet Transfer: Supervision/safety;RW           Functional mobility during ADLs: Supervision/safety;Rolling walker                                                                                           Pertinent Vitals/ Pain       Faces Pain Scale: Hurts a little bit Pain Location: back Pain Descriptors / Indicators: Sore Pain  Intervention(s): Monitored during session                                                          Frequency  Min 2X/week        Progress Toward Goals  OT Goals(current goals can now be found in the care plan section)  Progress towards OT goals: Progressing toward goals  Acute Rehab OT Goals Patient Stated Goal: continue to be pain free and move better OT Goal Formulation: With patient Time For Goal Achievement: 10/30/20 Potential to Achieve Goals: Good  Plan Discharge plan remains appropriate    Co-evaluation                 AM-PAC OT "6 Clicks" Daily Activity     Outcome Measure   Help from another person eating meals?: None Help from another person taking care of personal grooming?: A Little Help from another person toileting, which includes using toliet, bedpan, or urinal?: A  Little Help from another person bathing (including washing, rinsing, drying)?: A Little Help from another person to put on and taking off regular upper body clothing?: A Little Help from another person to put on and taking off regular lower body clothing?: A Little 6 Click Score: 19    End of Session Equipment Utilized During Treatment: Rolling walker  OT Visit Diagnosis: Other abnormalities of gait and mobility (R26.89);Muscle weakness (generalized) (M62.81);Pain   Activity Tolerance Patient tolerated treatment well   Patient Left in bed;with call bell/phone within reach   Nurse Communication          Time: 1430-1500 OT Time Calculation (min): 30 min  Charges: OT General Charges $OT Visit: 1 Visit OT Treatments $Self Care/Home Management : 23-37 mins  10/24/2020  Rich, OTR/L  Acute Rehabilitation Services  Office:  [phone]    Derek Woods 10/24/2020, 3:59 PM

## 2020-10-24 NOTE — Progress Notes (Signed)
      [address]       [place] [address]             [phone]      14 days post right thoracotomy for drainage of empyema  Subjective: Awake and alert. Says he has had no concerns with the right chest incision or his breathing.    Objective: Vital signs in last 24 hours: Temp:  [98 F (36.7 C)-99.3 F (37.4 C)] 98 F (36.7 C) (05/11 0352) Pulse Rate:  [93-102] 98 (05/11 0352) Cardiac Rhythm: Normal sinus rhythm (05/10 1905) Resp:  [16-20] 18 (05/11 0352) BP: (126-133)/(81-90) 128/88 (05/11 0352) SpO2:  [95 %-97 %] 97 % (05/11 0352)   Intake/Output from previous day: 05/10 0701 - 05/11 0700 In: 720 [P.O.:720] Out: 2871 [Urine:2871] Intake/Output this shift: No intake/output data recorded.  General appearance: alert, cooperative and mild distress Lungs: breath sounds are clear. No dyspnea on RA at rest.  Wound: the right thoracotomy incision is well approximated with skin staples. The chest tube sutures remain in place and sites are well healed.  Lab Results: Recent Labs    10/23/20 0430 10/24/20 0415  WBC 4.1 4.9  HGB 9.0* 10.3*  HCT 28.4* 32.4*  PLT 287 320   BMET:  Recent Labs    10/23/20 0430 10/24/20 0415  NA 133* 135  K 4.4 4.6  CL 103 103  CO2 25 25  GLUCOSE 125* 126*  BUN 22* 18  CREATININE 0.78 0.87  CALCIUM 8.8* 9.3    PT/INR: No results for input(s): LABPROT, INR in the last 72 hours. ABG    Component Value Date/Time   PHART 7.386 10/10/2020 0458   HCO3 28.7 (H) 10/10/2020 0458   TCO2 30 10/10/2020 0458   O2SAT 99.0 10/10/2020 0458   CBG (last 3)  No results for input(s): GLUCAP in the last 72 hours.  Assessment/Plan:  -POD 14 right thoracotomy for drainage of empyema.  The incisions are well healed. ABX per ID and primary teams.  -Will remove the skin staples and sutures today.    LOS: 17 days   Leary Roca, [place] [phone] 10/24/2020

## 2020-10-24 NOTE — Progress Notes (Signed)
Physical Therapy Treatment Patient Details Name: Derek Woods MRN: [id number] DOB: 12-25-1990 Today's Date: 10/24/2020    History of Present Illness [name] 30 yo male who presented on 10/07/20 with shortness of breath and found to have large R empyema.  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  Medical hx includes daily heroin IVDA (previous suboxone use), asthma, morbid obesity, OSA, and smokes.    PT Comments    Pt progressed to hallway gait without use of RW today, increasing pt fatigue and need for standing rest breaks. Pt reports moderate back pain today, but tolerates well during mobility. PT to continue to progress mobility as tolerated.    Follow Up Recommendations  Home health PT;Supervision for mobility/OOB     Equipment Recommendations  Rolling walker with 5" wheels;3in1 (PT) (bariatric)    Recommendations for Other Services       Precautions / Restrictions Precautions Precautions: Back;Fall Precaution Comments: continued log roll technique in/out of bed, no twisting back, and bend from hips/knees when sit<>stand Restrictions Weight Bearing Restrictions: No    Mobility  Bed Mobility Overal bed mobility: Needs Assistance Bed Mobility: Supine to Sit;Sit to Supine Rolling: Supervision Sidelying to sit: Supervision Supine to sit: HOB elevated;Supervision Sit to supine: Supervision;HOB elevated Sit to sidelying: Supervision General bed mobility comments: for safety, good log roll technique    Transfers Overall transfer level: Needs assistance Equipment used: None Transfers: Sit to/from Stand Sit to Stand: Supervision;From elevated surface         General transfer comment: for safety, verbal cuing for hand placement when rising. STS x5, all from EOB and as intervention  for strengthening.  Ambulation/Gait Ambulation/Gait assistance: Min guard Gait Distance (Feet): 160 Feet Assistive device: None Gait Pattern/deviations: Step-through pattern;Decreased stride length;Trunk flexed;Antalgic Gait velocity: decr   General Gait Details: Min guard for safety, verbal cuing for reaching out for support as needed from PT, standing rest breaks x2. HRmax 130s bpm, DOE 2/4   Stairs             Wheelchair Mobility    Modified Rankin (Stroke Patients Only)       Balance Overall balance assessment: Needs assistance Sitting-balance support: Feet supported Sitting balance-Leahy Scale: Good     Standing balance support: No upper extremity supported;During functional activity Standing balance-Leahy Scale: Fair Standing balance comment: ambulatory without AD today                            Cognition Arousal/Alertness: Awake/alert Behavior During Therapy: WFL for tasks assessed/performed Overall Cognitive Status: Within Functional Limits for tasks assessed                                        Exercises Other Exercises Other Exercises: STS x4 from EOB, as intervention    General Comments        Pertinent Vitals/Pain Pain Assessment: Faces Faces Pain Scale: Hurts little more Pain Location: back, R side Pain Descriptors / Indicators: Sore;Discomfort Pain Intervention(s): Limited activity within patient's tolerance;Monitored during session;Repositioned    Home Living                      Prior  Function            PT Goals (current goals can now be found in the care plan section) Acute Rehab PT Goals Patient Stated Goal: to get better PT Goal Formulation: With patient Time For Goal Achievement: 10/30/20 Potential to Achieve Goals: Good Progress towards PT goals: Progressing toward goals    Frequency    Min 5X/week      PT Plan Current plan remains appropriate    Co-evaluation               AM-PAC PT "6 Clicks" Mobility   Outcome Measure  Help needed turning from your back to your side while in a flat bed without using bedrails?: A Little Help needed moving from lying on your back to sitting on the side of a flat bed without using bedrails?: A Little Help needed moving to and from a bed to a chair (including a wheelchair)?: A Little Help needed standing up from a chair using your arms (e.g., wheelchair or bedside chair)?: A Little Help needed to walk in hospital room?: A Little Help needed climbing 3-5 steps with a railing? : A Lot 6 Click Score: 17    End of Session   Activity Tolerance: Patient tolerated treatment well Patient left: with bed alarm set;in bed;with call bell/phone within reach Nurse Communication: Mobility status PT Visit Diagnosis: Other abnormalities of gait and mobility (R26.89);Muscle weakness (generalized) (M62.81);Pain Pain - part of body:  (back)     Time: 0973-5329 PT Time Calculation (min) (ACUTE ONLY): 18 min  Charges:  $Gait Training: 8-22 mins                    Derek Woods, PT DPT Acute Rehabilitation Services Pager [phone]  Office [phone]    Derek Woods 10/24/2020, 5:17 PM

## 2020-10-24 NOTE — Progress Notes (Signed)
Pharmacy Antibiotic Note  Derek Woods is a 30 y.o. male admitted on 10/07/2020 with loculated pleural effusion with empyema now s/p VATS and thoracotomy. Also with T11-T12 discitis and osteomyelitis, epidural abscess s/p thoracic laminectomy. Pharmacy has been consulted for vancomycin dosing.  Vancomycin trough is 12, therapeutic   Plan: Continue vancomycin to 1000mg  IV q12h (eAUC 575) Monitor renal function, levels as indicated   Height: 5\' 6"  (167.6 cm) Weight: (!) 146.7 kg (323 lb 6.4 oz) IBW/kg (Calculated) : 63.8  Temp (24hrs), Avg:98.5 F (36.9 C), Min:98 F (36.7 C), Max:99.3 F (37.4 C)  Recent Labs  Lab 10/17/20 1215 10/18/20 0500 10/19/20 0500 10/20/20 0500 10/21/20 0534 10/22/20 0612 10/23/20 0430 10/24/20 0415 10/24/20 0923  WBC  --    < > 7.9 6.4 6.3 4.5 4.1 4.9  --   CREATININE  --   --  0.83  --  0.92 0.89 0.78 0.87  --   VANCOTROUGH 8*  --   --   --   --   --   --   --  12*   < > = values in this interval not displayed.    Estimated Creatinine Clearance: 168.8 mL/min (by C-G formula based on SCr of 0.87 mg/dL).    Allergies  Allergen Reactions  . Augmentin [Amoxicillin-Pot Clavulanate] Other (See Comments)    Pt does not recall, childhood  . Ceclor [Cefaclor] Other (See Comments)    Pt does not recall, childhood  . Sulfa Antibiotics Hives    Antimicrobials this admission: 4/24 vancomycin >> 4/24 cefepime >> 4/28  Dose adjustments this admission: 4/28 Vanc random 30 - hold vancomycin 4/29 Vanc random 11 - start 2000mg  IV q24h 5/4 AUC 575 on 2000mg  IV q24 > 1000mg  IV q12h (eAUC 575)  Microbiology results: 4/24 BCx: ngtd (collected prior to abx) 4/25 MRSA PCR +  4/26 pleural tissue: staph aureus > MRSA 4/26 pleural fluid: staph aureus > MRSA 5/2 epidural abscess: MRSA  , PharmD Student  10/24/2020 11:49 AM  Please check [email or web address] for unit-specific pharmacy phone numbers.

## 2020-10-25 MED ORDER — METHOCARBAMOL 500 MG PO TABS
500.0000 mg | ORAL_TABLET | Freq: Four times a day (QID) | ORAL | Status: DC | PRN
  Administered 2020-10-25 – 2020-11-27 (×91): 500 mg via ORAL
  Filled 2020-10-25 (×94): qty 1

## 2020-10-25 MED ORDER — FAMOTIDINE 20 MG PO TABS
20.0000 mg | ORAL_TABLET | Freq: Two times a day (BID) | ORAL | Status: DC
  Administered 2020-10-25 – 2020-10-27 (×6): 20 mg via ORAL
  Filled 2020-10-25 (×6): qty 1

## 2020-10-25 MED ORDER — KETOROLAC TROMETHAMINE 10 MG PO TABS
10.0000 mg | ORAL_TABLET | Freq: Four times a day (QID) | ORAL | Status: AC | PRN
  Administered 2020-10-25 – 2020-10-30 (×9): 10 mg via ORAL
  Filled 2020-10-25 (×12): qty 1

## 2020-10-25 MED ORDER — PREGABALIN 50 MG PO CAPS
50.0000 mg | ORAL_CAPSULE | Freq: Every day | ORAL | Status: DC
  Administered 2020-10-25 – 2020-11-26 (×33): 50 mg via ORAL
  Filled 2020-10-25 (×33): qty 1

## 2020-10-25 NOTE — Progress Notes (Signed)
13 staples removed per MD order. Patient tolerated removal fair. 2nd RN present.

## 2020-10-25 NOTE — Progress Notes (Signed)
Physical Therapy Treatment Patient Details Name: Derek Woods MRN: [id number] DOB: April 06, 1991 Today's Date: 10/25/2020    History of Present Illness [name] 30 yo male who presented on 10/07/20 with shortness of breath and found to have large R empyema.  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  Medical hx includes daily heroin IVDA (previous suboxone use), asthma, morbid obesity, OSA, and smokes.    PT Comments    Patient requesting to use RW this session due to increased back pain following previous session without AD. Patient ambulated 150' x 2 with standing rest break between bouts. Overall, patient requiring supervision for mobility with RW. Continue to recommend HHPT following discharge to maximize functional independence and safety in the home.     Follow Up Recommendations  Home health PT;Supervision for mobility/OOB     Equipment Recommendations  Rolling Derek Woods with 5" wheels;3in1 (PT) (bariatric)    Recommendations for Other Services       Precautions / Restrictions Precautions Precautions: Back;Fall Precaution Booklet Issued: No Precaution Comments: reviewed back precautions Restrictions Weight Bearing Restrictions: No    Mobility  Bed Mobility Overal bed mobility: Needs Assistance Bed Mobility: Rolling;Sidelying to Sit Rolling: Supervision Sidelying to sit: Supervision       General bed mobility comments: supervision, good recall of log roll technique    Transfers Overall transfer level: Needs assistance Equipment used: Rolling Derek Woods (2 wheeled) Transfers: Sit to/from Stand Sit to Stand: Supervision         General transfer comment: supervision for safety, cues for hand placement  Ambulation/Gait Ambulation/Gait assistance: Supervision Gait Distance (Feet):  150 Feet (x150) Assistive device: Rolling Derek Woods (2 wheeled) Gait Pattern/deviations: Step-through pattern;Decreased stride length;Trunk flexed;Antalgic Gait velocity: decreased   General Gait Details: Requested use of RW due to increased pain following ambulation without AD during previous session. Cues for upright posture. Supervision for safety   Stairs             Wheelchair Mobility    Modified Rankin (Stroke Patients Only)       Balance Overall balance assessment: Needs assistance Sitting-balance support: Feet supported Sitting balance-Derek Woods: Good     Standing balance support: Bilateral upper extremity supported;During functional activity Standing balance-Derek Woods: Poor Standing balance comment: reliant on UE support                            Cognition Arousal/Alertness: Awake/alert Behavior During Therapy: WFL for tasks assessed/performed Overall Cognitive Status: Within Functional Limits for tasks assessed                                        Exercises      General Comments        Pertinent Vitals/Pain Pain Assessment: Faces Faces Pain Woods: Hurts even more Pain Location: back, R side Pain Descriptors / Indicators: Sore;Discomfort Pain Intervention(s): Monitored during session;Repositioned    Home Living                      Prior Function            PT Goals (current goals can now be found in the care plan section) Acute  Rehab PT Goals Patient Stated Goal: to get better PT Goal Formulation: With patient Time For Goal Achievement: 10/30/20 Potential to Achieve Goals: Good Progress towards PT goals: Progressing toward goals    Frequency    Min 5X/week      PT Plan Current plan remains appropriate    Co-evaluation              AM-PAC PT "6 Clicks" Mobility   Outcome Measure  Help needed turning from your back to your side while in a flat bed without using bedrails?: A  Little Help needed moving from lying on your back to sitting on the side of a flat bed without using bedrails?: A Little Help needed moving to and from a bed to a chair (including a wheelchair)?: A Little Help needed standing up from a chair using your arms (e.g., wheelchair or bedside chair)?: A Little Help needed to walk in hospital room?: A Little Help needed climbing 3-5 steps with a railing? : A Lot 6 Click Score: 17    End of Session Equipment Utilized During Treatment: Other (comment) (unable to use gait belt due to surgical site and obesity) Activity Tolerance: Patient tolerated treatment well Patient left: in chair;with call bell/phone within reach;with chair alarm set Nurse Communication: Mobility status PT Visit Diagnosis: Other abnormalities of gait and mobility (R26.89);Muscle weakness (generalized) (M62.81);Pain     Time: 3875-6433 PT Time Calculation (min) (ACUTE ONLY): 30 min  Charges:  $Gait Training: 23-37 mins                     Derek Woods A. Dan Humphreys PT, DPT Acute Rehabilitation Services Pager [phone] Office [phone]    Derek Woods 10/25/2020, 5:10 PM

## 2020-10-25 NOTE — Progress Notes (Signed)
[facility] PROGRESS NOTE  Jaidyn Kuhl UMP:[id number] DOB: 1990/07/23 DOA: 10/07/2020 PCP: Pcp, No  Status: Remains inpatient appropriate because:Unsafe d/c plan, IV treatments appropriate due to intensity of illness or inability to take PO and Inpatient level of care appropriate due to severity of illness   Dispo: The patient is from: Home              Anticipated d/c is to: Home to live with mother who is disabled secondary to rheumatoid arthritis              Patient currently is not medically stable to d/c.   Difficult to place patient Yes   Level of care: Med-Surg  Code Status: Full Family Communication: Patient DVT prophylaxis: Lovenox Vaccination status: Unknown   HPI: 30 year old male with past medical history of daily IV drug abuse with heroin and has used Suboxone from the streets.  He also has asthma, morbid obesity with a BMI of 52, daily smoker, suspected sleep apnea.  He presented to [facility] on 4/24 with back pain, right flank pain and shortness of breath x1 week.  CT of the chest consistent with large right empyema and he was admitted by the hospitalist team.  Subsequently taken to the OR on 4/26 for VATS and drainage of right empyema but due to severity of disease thoracotomy was completed instead.  He was a difficult intubation prior to the procedure and was unable to be weaned from the ventilator postoperatively.  There were also concerns of possible difficulty with appropriate oxygenation during the procedure.  He was subsequently admitted to the ICU under the care of PCCM.  Eventually extubated on 4/27 and transferred out of ICU on 4/28.  Posterior chest tube removed 4/29.  Intraoperative cultures grew MRSA.  Antibiotics have been de-escalated to vancomycin.  ID consulted on 4/30.  MRI of thoracic spine was done and demonstrated T11/T12 discitis and possible abscess.  Neurosurgery was consulted and he subsequently underwent decompressive thoracic laminectomy on  5/2.  Incidental finding of hep C positive antibodies.   Subjective: Alert, still having some difficulty sleeping due to awakening with back discomfort.  States typically sleeps on side and has been unable to do this during hospitalization secondary to pain.  States had better results with Toradol as opposed to ibuprofen.  Discussed initiating very low-dose Lyrica at bedtime only to help with pain management and sleep.  Patient agrees with continuing Suboxone after discharge.  I told him when closer to discharge we will provide him with contact information for the Suboxone clinic so he could arrange an initial intake interview and set up a follow-up appointment for after discharge.  Objective: Vitals:   10/25/20 0001 10/25/20 0406  BP: 124/77 121/74  Pulse: 97 86  Resp: 18 19  Temp: 98.9 F (37.2 C) 98.3 F (36.8 C)  SpO2: 98% 96%    Intake/Output Summary (Last 24 hours) at 10/25/2020 0829 Last data filed at 10/25/2020 0149 Gross per 24 hour  Intake --  Output 1350 ml  Net -1350 ml   Filed Weights   10/07/20 1006  Weight: (!) 146.7 kg    Exam:  Constitutional: NAD, calm, mildly uncomfortable secondary to ongoing back Respiratory: clear to auscultation bilaterally, no wheezing, no crackles. Normal respiratory effort. No accessory muscle use.  Cardiovascular: Regular rate and rhythm, no murmurs / rubs / gallops. No extremity edema.  Skin warm and dry..  Abdomen: Obese, no tenderness, no masses palpated. Bowel sounds positive. LBM 5/9 Neurologic:  CN 2-12 grossly intact. Sensation intact, Strength 5/5 x all 4 extremities.  Ambulates with rolling walker. Psychiatric: Normal judgment and insight. Alert and oriented x 3. Normal mood.    Assessment/Plan: Acute problems: Acute respiratory failure with hypercarbia and hypoxia (Resolved)/Right MRSA empyema s/p right thoracotomy on 10/09/2020 Briefly intubated in the immediate postop period.  Chest tubes and all sutures have been  removed  Intraoperative culture +MRSA.  Continue vancomycin ID recommends duration of therapy to be 6 weeks from date of initial source control on 5/2 with last dose due on 11/27/2020.  If he changes his mind/decides to leave AMA, can consider alternative options like oritavancin.  ID to be consulted again when he is approaching end date of antibiotics for reevaluation for transition to oral antibiotics +/- reimaging.    Suspected OSA/OHS Will need eventual formal testing after discharge-likely will be unable to proceed until Medicaid approved Currently no nocturnal hypoxemia  T11-12 discitis, osteomyelitis and epidural abscess/S/p decompressive thoracic laminectomy 10/15/2020.   Drains have been removed and follow-up lumbar MRI w/o evidence of infection.   Neurosurgery signed off 5/5 -recommend outpatient follow-up in 2 weeks  Continue Suboxone 4 mg BID,Tylenol to 1 g 3 times daily.  Completed 5 days course of as needed IV Toradol.   Reports ibuprofen ineffective so we will trial oral Toradol with Pepcid; Continue Topical lidocaine.   Continue Flexeril for muscle spasms  Add Lyrica 50 mg HS  Essential hypertension Norvasc initiated this hospitalization.   Polysubstance abuse/IVDU Continue Suboxone.  Avoid other opioids. Plan is to continue after discharge and follow-up at internal medicine Suboxone clinic/Dr. Oswaldo Done Patient will need to call the clinic closer to discharge to arrange for intake interview  Physical deconditioning Improving and able to ambulate at times for short distances without rolling walker Current recommendation is for home health PT  Morbid obesity Estimated body mass index is 52.2 kg/m as calculated from the following:   Height as of this encounter: 5\' 6"  (1.676 m).   Weight as of this encounter: 146.7 kg.  Other problems: Positive hepatitis C antibody No known previous history of hepatitis C infection.  Antibodies positive.  ID will follow up HCV RNA and  have made a follow-up appointment at [facility] for HCV treatment on 6/21 at 8:45 AM.  Anxiety Continue as needed hydroxyzine.  Normocytic anemia Hemoglobin is stable over 9.  Monitor CBC periodically.  Tobacco dependence Smoking cessation counseling provided.   Data Reviewed: Basic Metabolic Panel: Recent Labs  Lab 10/19/20 0500 10/21/20 0534 10/22/20 0612 10/23/20 0430 10/24/20 0415  NA 133* 135 134* 133* 135  K 4.9 5.0 4.8 4.4 4.6  CL 97* 101 102 103 103  CO2 28 27 26 25 25   GLUCOSE 116* 119* 108* 125* 126*  BUN 18 22* 23* 22* 18  CREATININE 0.83 0.92 0.89 0.78 0.87  CALCIUM 9.3 9.3 9.0 8.8* 9.3   Liver Function Tests: Recent Labs  Lab 10/24/20 0415  AST 26  ALT 20  ALKPHOS 108  BILITOT 0.6  PROT 7.2  ALBUMIN 2.8*     CBC: Recent Labs  Lab 10/20/20 0500 10/21/20 0534 10/22/20 0612 10/23/20 0430 10/24/20 0415  WBC 6.4 6.3 4.5 4.1 4.9  NEUTROABS 4.4 3.5 2.5 2.3 2.7  HGB 9.6* 10.4* 9.2* 9.0* 10.3*  HCT 29.9* 33.5* 29.1* 28.4* 32.4*  MCV 95.2 95.4 94.5 95.0 93.9  PLT 291 359 276 287 320     Scheduled Meds: . acetaminophen  1,000 mg Oral TID  .  amLODipine  5 mg Oral Daily  . buprenorphine-naloxone  2 tablet Sublingual BID  . Chlorhexidine Gluconate Cloth  6 each Topical Daily  . docusate sodium  100 mg Oral BID  . enoxaparin (LOVENOX) injection  70 mg Subcutaneous Q24H  . lidocaine  1 patch Transdermal Q24H  . mouth rinse  15 mL Mouth Rinse BID  . nicotine  14 mg Transdermal Daily  . pantoprazole  40 mg Oral QHS  . polyethylene glycol  17 g Oral Daily  . senna-docusate  2 tablet Oral QHS  . sodium chloride flush  10-40 mL Intracatheter Q12H  . sodium chloride flush  3 mL Intravenous Q12H  . sodium chloride flush  3 mL Intravenous Q12H   Continuous Infusions: . sodium chloride Stopped (10/16/20 1455)  . vancomycin 1,000 mg (10/24/20 2238)    Principal Problem:   Empyema lung (HCC) Active Problems:   Polysubstance dependence including  opioid type drug with complication, episodic abuse (HCC)   Morbid obesity with BMI of 50.0-59.9, adult (HCC)   Tobacco dependence   MRSA infection   Abscess in epidural space of thoracic spine   Consultants:  CT surgery  ID  Neurosurgery   Procedures:  Chest tube placement, thoracotomy and intubation  Right upper arm PICC line.  S/p decompressive thoracic laminectomy T11-12 on 10/15/2020   Antibiotics:  Vancomycin 4/24 >>  Maxipime 4/24 through 4/28  Ancef 5/2 through 5/3   Time spent: 35    Junious Silk ANP  [facility] 7 am - 330 pm/M-F for direct patient care and secure chat Please refer to Amion for contact info 18  days

## 2020-10-26 MED ORDER — NAPROXEN 250 MG PO TABS
250.0000 mg | ORAL_TABLET | Freq: Two times a day (BID) | ORAL | Status: DC
  Administered 2020-10-26 – 2020-11-19 (×48): 250 mg via ORAL
  Filled 2020-10-26 (×51): qty 1

## 2020-10-26 NOTE — Progress Notes (Signed)
Physical Therapy Treatment Patient Details Name: Derek Woods MRN: [id number] DOB: [birth year] Today's Date: 10/26/2020    History of Present Illness [name] 30 yo male who presented on 10/07/20 with shortness of breath and found to have large R empyema.  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  Medical hx includes daily heroin IVDA (previous suboxone use), asthma, morbid obesity, OSA, and smokes.    PT Comments    Patient progressing towards physical therapy goals. Patient supervision for mobility with RW as patient requests to use RW for pain relief during ambulation. Encouraged patient to ambulate with nursing staff over weekend to improve endurance and strength. Educated patient on activating abdominals during bed mobility, transfers, and ambulation to support back with movement. Continue to recommend HHPT following discharge to maximize functional mobility and safety.     Follow Up Recommendations  Home health PT;Supervision for mobility/OOB     Equipment Recommendations  Rolling Jaedah Lords with 5" wheels;3in1 (PT) (bariatric)    Recommendations for Other Services       Precautions / Restrictions Precautions Precautions: Back Precaution Booklet Issued: No Precaution Comments: reinforced back precautions Restrictions Weight Bearing Restrictions: No    Mobility  Bed Mobility Overal bed mobility: Needs Assistance Bed Mobility: Rolling;Sidelying to Sit Rolling: Supervision Sidelying to sit: Supervision       General bed mobility comments: supervision, good recall of log roll technique    Transfers Overall transfer level: Needs assistance Equipment used: Rolling Teona Vargus (2 wheeled) Transfers: Sit to/from Stand Sit to Stand: Supervision         General transfer comment: supervision  for safety  Ambulation/Gait Ambulation/Gait assistance: Supervision Gait Distance (Feet): 200 Feet Assistive device: Rolling Andrae Claunch (2 wheeled) Gait Pattern/deviations: Step-through pattern;Decreased stride length;Trunk flexed;Antalgic Gait velocity: decreased   General Gait Details: use of RW for comfort for patient. Supervision for safety. 2/4 DOE   Social research officer, government Rankin (Stroke Patients Only)       Balance Overall balance assessment: Needs assistance Sitting-balance support: Feet supported Sitting balance-Leahy Scale: Normal     Standing balance support: Bilateral upper extremity supported;During functional activity Standing balance-Leahy Scale: Poor Standing balance comment: reliant on UE support during ambulation for support                            Cognition Arousal/Alertness: Awake/alert Behavior During Therapy: WFL for tasks assessed/performed Overall Cognitive Status: Within Functional Limits for tasks assessed                                        Exercises      General Comments        Pertinent Vitals/Pain Pain Assessment: Faces Pain Score: 2  Faces Pain Scale: Hurts little more Pain Location: back, R side Pain Descriptors / Indicators: Sore;Discomfort Pain Intervention(s): Monitored during session    Home Living                      Prior Function            PT Goals (current goals can now be  found in the care plan section) Acute Rehab PT Goals Patient Stated Goal: Be able to walk around my room without calling. PT Goal Formulation: With patient Time For Goal Achievement: 10/30/20 Potential to Achieve Goals: Good Progress towards PT goals: Progressing toward goals    Frequency    Min 3X/week      PT Plan Frequency needs to be updated    Co-evaluation              AM-PAC PT "6 Clicks" Mobility   Outcome Measure  Help needed turning from  your back to your side while in a flat bed without using bedrails?: A Little Help needed moving from lying on your back to sitting on the side of a flat bed without using bedrails?: A Little Help needed moving to and from a bed to a chair (including a wheelchair)?: A Little Help needed standing up from a chair using your arms (e.g., wheelchair or bedside chair)?: A Little Help needed to walk in hospital room?: A Little Help needed climbing 3-5 steps with a railing? : A Lot 6 Click Score: 17    End of Session   Activity Tolerance: Patient tolerated treatment well Patient left: in bed;with nursing/sitter in room (sitting EOB with RN present) Nurse Communication: Mobility status PT Visit Diagnosis: Other abnormalities of gait and mobility (R26.89);Muscle weakness (generalized) (M62.81);Pain     Time: 2706-2376 PT Time Calculation (min) (ACUTE ONLY): 29 min  Charges:  $Therapeutic Activity: 23-37 mins                     Nihaal Friesen A. Dan Humphreys PT, DPT Acute Rehabilitation Services Pager [phone] Office [phone]    [name] 10/26/2020, 5:10 PM

## 2020-10-26 NOTE — Progress Notes (Signed)
PROGRESS NOTE    Derek Woods  GEX:[id number] DOB: 21-Sep-1990 DOA: 10/07/2020 PCP: Pcp, No   Brief Narrative:  Derek Woods is a 30 y.o. M, with a pertinent pmx of daily heroin IVDA (previous suboxone use), asthma, morbid obesity (BMI 52), 1 PPD smoking, possible OSA, presented to [facility] on 4/24 with complaints of back pain, right flank pain, and one week with shortness of breath . CT of his chest revealed a large right empyema. He was admitted to [facility] by the hospitalist team.  He was taken to the OR on 4/26 with Dr. Laneta Simmers for a VATS and drainage of right empyema, however a right thoracotomy had to be prefomed. He was a difficult intubation for the procedure and he was unable to be weaned from the ventilator post operatively. There was reported difficulty with oxygenation during the procedure. PCCM was consulted for ventilator management who assumed care as primary team.  Patient was extubated on 10/10/2020 and was transferred under [facility] care on 10/11/2020.  Posterior chest tube was removed on 10/12/2020.  Thoracotomy culture growing MRSA.  Antibiotics de-escalated to vancomycin.  ID consulted on 10/13/2020.  MRI thoracic spine was done which showed T11/T12 discitis and possible abscess. Status post decompressive thoracic laminectomy T11-12 by neurosurgery on 10/15/2020.  Hep C antibodies positive.   Assessment & Plan:  Acute respiratory failure with hypercarbia and hypoxia/right MRSA empyema s/p right thoracotomy on 10/09/2020/suspected OSA/OHS  Patient required intubation on 10/09/2020 and was extubated on 10/10/2020.   S/p thoracotomy as well as chest tube.  Cardiothoracic surgery has been following intermittently.  Chest tubes were removed.  Sutures were removed as well.   Cultures growing MRSA.  He is on vancomycin which we will continue. As per ID follow-up 5/5, recommend at least 6 weeks of IV antibiotics (vancomycin per pharmacy) from date of source control on 5/2 given severity of infection involving  lung and spine.  Patient agreeable to complete 6 weeks of IV antibiotics.  Tentative end date: 11/27/2020.  ID indicates that if he changes his mind or decides to leave AMA, can consider alternative options like oritavancin at that time.  ID to be consulted again when he is approaching end date of antibiotics for reevaluation for transition to oral antibiotics +/- reimaging.   Seems to be stable from respiratory standpoint.  Continues to have pain in the chest area as well as his back.  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  Hemovac discontinued 5/4.  Lumbar MRI shows no evidence of infection.  Neurosurgery signed off 5/5 and recommend outpatient follow-up in 2 weeks (patient however will still be in the hospital and they can be called back).   Antibiotic regimen per ID as noted above.   Multimodality pain control: Remains on Suboxone 4 mg twice a day, acetaminophen 1 g 3 times a day.  Completed 5-day course of as needed Toradol.  Was briefly on ibuprofen which was not effective so will transition back to oral Toradol with Pepcid.  Will add naproxen on a scheduled basis. Was on Flexeril for muscle spasms which were changed over to Robaxin as needed.   Patient is also on topical lidocaine.    Suspected OSA/OHS Will need outpatient valuation.  Anxiety Continue as needed hydroxyzine.  Essential hypertension Started on amlodipine with good control of blood pressure.    Polysubstance abuse/IVDU Continue Suboxone.  Avoid other opioids. Plan is to continue after discharge and follow-up at internal medicine  Suboxone clinic/Dr. Oswaldo Done Patient will need to call the clinic closer to discharge to arrange for intake interview  Normocytic anemia Stable.  Monitor periodically.  Tobacco dependence Smoking cessation counseling provided.  Positive hepatitis C antibody No known previous history of hepatitis  C infection.  Antibodies positive.  ID will follow up HCV RNA and have made a follow-up appointment at [facility] for HCV treatment on 6/21 at 8:45 AM.  Morbid obesity Estimated body mass index is 52.2 kg/m as calculated from the following:   Height as of this encounter: 5\' 6"  (1.676 m).   Weight as of this encounter: 146.7 kg.   DVT prophylaxis: Lovenox   Code Status: Full Code  Family Communication: None at bedside. Disposition: We will need to stay in the hospital till completion of IV antibiotic course.   Status is: Inpatient  Remains inpatient appropriate because:Inpatient level of care appropriate due to severity of illness   Dispo: The patient is from: Home              Anticipated d/c is to: Home              Patient currently is not medically stable to d/c.   Difficult to place patient: Yes    Consultants:   CT surgery  ID  Neurosurgery  Procedures:   Chest tube placement, thoracotomy and intubation  Right upper arm PICC line.  S/p decompressive thoracic laminectomy T11-12 on 10/15/2020  Antimicrobials:  Anti-infectives (From admission, onward)   Start     Dose/Rate Route Frequency Ordered Stop   10/18/20 1000  vancomycin (VANCOREADY) IVPB 1000 mg/200 mL        1,000 mg 200 mL/hr over 60 Minutes Intravenous Every 12 hours 10/17/20 1601     10/15/20 1830  ceFAZolin (ANCEF) IVPB 2g/100 mL premix        2 g 200 mL/hr over 30 Minutes Intravenous Every 8 hours 10/15/20 1744 10/16/20 0659   10/13/20 1200  vancomycin (VANCOREADY) IVPB 2000 mg/400 mL  Status:  Discontinued        2,000 mg 200 mL/hr over 120 Minutes Intravenous Every 24 hours 10/12/20 1115 10/17/20 1559   10/12/20 1215  vancomycin (VANCOREADY) IVPB 500 mg/100 mL        500 mg 100 mL/hr over 60 Minutes Intravenous  Once 10/12/20 1115 10/12/20 1329   10/12/20 0600  vancomycin (VANCOREADY) IVPB 1500 mg/300 mL  Status:  Discontinued        1,500 mg 150 mL/hr over 120 Minutes Intravenous Every 24 hours  10/12/20 0541 10/12/20 0556   10/12/20 0600  vancomycin (VANCOREADY) IVPB 1500 mg/300 mL  Status:  Discontinued        1,500 mg 150 mL/hr over 120 Minutes Intravenous Every 18 hours 10/12/20 0556 10/12/20 1115   10/11/20 1343  vancomycin variable dose per unstable renal function (pharmacist dosing)  Status:  Discontinued         Does not apply See admin instructions 10/11/20 1344 10/12/20 0556   10/10/20 0400  vancomycin (VANCOREADY) IVPB 1250 mg/250 mL  Status:  Discontinued        1,250 mg 166.7 mL/hr over 90 Minutes Intravenous Every 8 hours 10/09/20 1835 10/11/20 1344   10/09/20 2000  vancomycin (VANCOREADY) IVPB 1250 mg/250 mL  Status:  Discontinued        1,250 mg 166.7 mL/hr over 90 Minutes Intravenous Every 8 hours 10/09/20 1117 10/09/20 1835   10/09/20 1900  vancomycin (VANCOCIN) 2,500 mg in sodium chloride 0.9 %  500 mL IVPB        2,500 mg 250 mL/hr over 120 Minutes Intravenous  Once 10/09/20 1835 10/09/20 2137   10/09/20 1200  vancomycin (VANCOCIN) 2,500 mg in sodium chloride 0.9 % 500 mL IVPB  Status:  Discontinued        2,500 mg 250 mL/hr over 120 Minutes Intravenous  Once 10/09/20 1113 10/09/20 1835   10/07/20 2200  vancomycin (VANCOREADY) IVPB 2000 mg/400 mL  Status:  Discontinued        2,000 mg 200 mL/hr over 120 Minutes Intravenous Every 12 hours 10/07/20 1251 10/07/20 1437   10/07/20 2000  ceFEPIme (MAXIPIME) 2 g in sodium chloride 0.9 % 100 mL IVPB  Status:  Discontinued        2 g 200 mL/hr over 30 Minutes Intravenous Every 8 hours 10/07/20 1251 10/11/20 1342   10/07/20 1145  ceFEPIme (MAXIPIME) 2 g in sodium chloride 0.9 % 100 mL IVPB        2 g 200 mL/hr over 30 Minutes Intravenous  Once 10/07/20 1048 10/07/20 1351   10/07/20 1100  vancomycin (VANCOCIN) 2,500 mg in sodium chloride 0.9 % 500 mL IVPB  Status:  Discontinued        2,500 mg 250 mL/hr over 120 Minutes Intravenous  Once 10/07/20 1048 10/07/20 1437         Subjective: Patient states that at best  his pain gets down to a 5 out of 10.  Agreeable to trying naproxen.  Having regular bowel movements.    Objective: Vitals:   10/25/20 1040 10/25/20 1924 10/25/20 2344 10/26/20 0311  BP: 126/81 139/85 119/71 123/73  Pulse:  (!) 107 87 90  Resp:  18 19 (!) 21  Temp:  99.2 F (37.3 C) 98.6 F (37 C) 98.4 F (36.9 C)  TempSrc:  Oral Oral Oral  SpO2:  96% 98% 96%  Weight:      Height:        Intake/Output Summary (Last 24 hours) at 10/26/2020 0912 Last data filed at 10/26/2020 9702 Gross per 24 hour  Intake --  Output 1000 ml  Net -1000 ml   Filed Weights   10/07/20 1006  Weight: (!) 146.7 kg    Examination:  General appearance: Awake alert.  In no distress Resp: Clear to auscultation bilaterally.  Normal effort Cardio: S1-S2 is normal regular.  No S3-S4.  No rubs murmurs or bruit GI: Abdomen is soft.  Nontender nondistended.  Bowel sounds are present normal.  No masses organomegaly Extremities: No edema.  Full range of motion of lower extremities. Neurologic: Alert and oriented x3.  No focal neurological deficits.       Data Reviewed: I have personally reviewed following labs and imaging studies  CBC: Recent Labs  Lab 10/20/20 0500 10/21/20 0534 10/22/20 0612 10/23/20 0430 10/24/20 0415  WBC 6.4 6.3 4.5 4.1 4.9  NEUTROABS 4.4 3.5 2.5 2.3 2.7  HGB 9.6* 10.4* 9.2* 9.0* 10.3*  HCT 29.9* 33.5* 29.1* 28.4* 32.4*  MCV 95.2 95.4 94.5 95.0 93.9  PLT 291 359 276 287 320   Basic Metabolic Panel: Recent Labs  Lab 10/21/20 0534 10/22/20 0612 10/23/20 0430 10/24/20 0415  NA 135 134* 133* 135  K 5.0 4.8 4.4 4.6  CL 101 102 103 103  CO2 27 26 25 25   GLUCOSE 119* 108* 125* 126*  BUN 22* 23* 22* 18  CREATININE 0.92 0.89 0.78 0.87  CALCIUM 9.3 9.0 8.8* 9.3   GFR: Estimated Creatinine Clearance: 168.8  mL/min (by C-G formula based on SCr of 0.87 mg/dL). Liver Function Tests: Recent Labs  Lab 10/24/20 0415  AST 26  ALT 20  ALKPHOS 108  BILITOT 0.6  PROT 7.2   ALBUMIN 2.8*     No results found for this or any previous visit (from the past 240 hour(s)).    Radiology Studies: No results found.  Scheduled Meds: . acetaminophen  1,000 mg Oral TID  . amLODipine  5 mg Oral Daily  . buprenorphine-naloxone  2 tablet Sublingual BID  . Chlorhexidine Gluconate Cloth  6 each Topical Daily  . docusate sodium  100 mg Oral BID  . enoxaparin (LOVENOX) injection  70 mg Subcutaneous Q24H  . famotidine  20 mg Oral BID  . lidocaine  1 patch Transdermal Q24H  . mouth rinse  15 mL Mouth Rinse BID  . nicotine  14 mg Transdermal Daily  . pantoprazole  40 mg Oral QHS  . polyethylene glycol  17 g Oral Daily  . pregabalin  50 mg Oral QHS  . senna-docusate  2 tablet Oral QHS  . sodium chloride flush  10-40 mL Intracatheter Q12H  . sodium chloride flush  3 mL Intravenous Q12H  . sodium chloride flush  3 mL Intravenous Q12H   Continuous Infusions: . sodium chloride Stopped (10/16/20 1455)  . vancomycin 1,000 mg (10/25/20 2126)     LOS: 19 days     Osvaldo ShipperGokul Modesta Sammons, MD [facility]  10/26/2020, 9:12 AM   To contact the attending provider between 7A-7P or the covering provider during after hours 7P-7A, please log into the web site [email or web address].

## 2020-10-26 NOTE — Progress Notes (Signed)
Occupational Therapy Treatment Patient Details Name: Derek Woods MRN: [id number] DOB: November 25, 1990 Today's Date: 10/26/2020    History of present illness [name] 30 yo male who presented on 10/07/20 with shortness of breath and found to have large R empyema.  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  Medical hx includes daily heroin IVDA (previous suboxone use), asthma, morbid obesity, OSA, and smokes.   OT comments  Patient with excellent progress toward all patient focused OT goals.  He is up and walking in his room ad lib without any loss of balance.  He is performing his ADL with the hip kit with minimal setup and occasional Min A if the sock aid does not function properly.  Overall he is exceeding his goals, and his POC will need to be updated on the 17th.  This OT decided to reduce his frequency to 1x/wk as his primary deficit is back discomfort and fair activity tolerance with mobility over longer distances, and [name] addressing this. OT will attempt to see if an MD order for showers is possible if PICC is covered.  This is his only real goal to achieve from a self care perspective.      Follow Up Recommendations  Home health OT    Equipment Recommendations  Tub/shower seat    Recommendations for Other Services      Precautions / Restrictions Precautions Precautions: Back Precaution Comments: reinforced back precautions Restrictions Weight Bearing Restrictions: No       Mobility Bed Mobility                 Patient Response: Cooperative  Transfers Overall transfer level: Modified independent   Transfers: Sit to/from Stand Sit to Stand: Modified independent (Device/Increase time)         General transfer comment: pushing IV pole    Balance Overall balance assessment:  Needs assistance Sitting-balance support: Feet supported Sitting balance-Leahy Scale: Normal     Standing balance support: Single extremity supported Standing balance-Leahy Scale: Good Standing balance comment: able to static stand and dynamic over short distances without AD                           ADL either performed or assessed with clinical judgement   ADL       Grooming: Set up;Sitting;Standing;Wash/dry hands;Oral care;Applying deodorant;Brushing hair           Upper Body Dressing : Set up;Sitting   Lower Body Dressing: Sit to/from stand;With adaptive equipment;Supervision/safety   Toilet Transfer: Modified Independent;Ambulation Toilet Transfer Details (indicate cue type and reason): pushing IV pole Toileting- Clothing Manipulation and Hygiene: Independent;Sit to/from stand               Vision       Perception     Praxis      Cognition Arousal/Alertness: Awake/alert Behavior During Therapy: [facility] for tasks assessed/performed Overall Cognitive Status: Within Functional Limits for tasks assessed  Pertinent Vitals/ Pain       Pain Score: 2  Pain Location: back, R side Pain Descriptors / Indicators: Sore;Discomfort Pain Intervention(s): Monitored during session                                                          Frequency  Min 1X/week        Progress Toward Goals  OT Goals(current goals can now be found in the care plan section)  Progress towards OT goals: Progressing toward goals  Acute Rehab OT Goals Patient Stated Goal: Be able to wlak around my room without calling. OT Goal Formulation: With patient Time For Goal Achievement: 10/30/20 Potential to Achieve Goals: Good  Plan Discharge plan needs to be updated;Frequency needs to be updated    Co-evaluation                 AM-PAC OT "6 Clicks" Daily  Activity     Outcome Measure   Help from another person eating meals?: None Help from another person taking care of personal grooming?: None Help from another person toileting, which includes using toliet, bedpan, or urinal?: None Help from another person bathing (including washing, rinsing, drying)?: A Little Help from another person to put on and taking off regular upper body clothing?: None Help from another person to put on and taking off regular lower body clothing?: A Little 6 Click Score: 22    End of Session    OT Visit Diagnosis: Other abnormalities of gait and mobility (R26.89);Muscle weakness (generalized) (M62.81);Pain   Activity Tolerance Patient tolerated treatment well   Patient Left in bed;with call bell/phone within reach   Nurse Communication          Time: 4159-7331 OT Time Calculation (min): 18 min  Charges: OT Treatments $Self Care/Home Management : 8-22 mins  10/26/2020  Rich, OTR/L  Acute Rehabilitation Services  Office:  [place] 10/26/2020, 3:29 PM

## 2020-10-27 DIAGNOSIS — D72819 Decreased white blood cell count, unspecified: Secondary | ICD-10-CM

## 2020-10-27 DIAGNOSIS — D649 Anemia, unspecified: Secondary | ICD-10-CM

## 2020-10-27 LAB — BASIC METABOLIC PANEL
Anion gap: 9 (ref 5–15)
BUN: 20 mg/dL (ref 6–20)
CO2: 23 mmol/L (ref 22–32)
Calcium: 9 mg/dL (ref 8.9–10.3)
Chloride: 100 mmol/L (ref 98–111)
Creatinine, Ser: 0.78 mg/dL (ref 0.61–1.24)
GFR, Estimated: >60 mL/min (ref >60)
Glucose, Bld: 101 mg/dL — ABNORMAL HIGH (ref 70–99)
Potassium: 4.5 mmol/L (ref 3.5–5.1)
Sodium: 132 mmol/L — ABNORMAL LOW (ref 135–145)

## 2020-10-27 LAB — CBC
HCT: 32.5 % — ABNORMAL LOW (ref 39.0–52.0)
Hemoglobin: 10.5 g/dL — ABNORMAL LOW (ref 13.0–17.0)
MCH: 29.7 pg (ref 26.0–34.0)
MCHC: 32.3 g/dL (ref 30.0–36.0)
MCV: 92.1 fL (ref 80.0–100.0)
Platelets: 240 10*3/uL (ref 150–400)
RBC: 3.53 MIL/uL — ABNORMAL LOW (ref 4.22–5.81)
RDW: 14.8 % (ref 11.5–15.5)
WBC: 2.5 10*3/uL — ABNORMAL LOW (ref 4.0–10.5)
nRBC: 0 % (ref 0.0–0.2)

## 2020-10-27 NOTE — Progress Notes (Signed)
Pharmacy Antibiotic Note  Derek Woods is a 30 y.o. male admitted on 10/07/2020 with MRSA  loculated pleural effusion and empyema s/p VATS and thoracotomy. Also with T11-T12 discitis and osteomyelitis, epidural abscess s/p thoracic laminectomy. Pharmacy has been consulted for vancomycin dosing.  5/11 Vancomycin trough was 12, therapeutic. Renal function stable. Planned course 6 weeks, end date  = 6/12  Plan: Continue vancomycin to 1000mg  IV q12h (eAUC 575) F/u Q Tuesday Vanc levels  Monitor renal function End date 6/12 entered    Height: 5\' 6"  (167.6 cm) Weight: (!) 146.7 kg (323 lb 6.4 oz) IBW/kg (Calculated) : 63.8  Temp (24hrs), Avg:98.8 F (37.1 C), Min:98.4 F (36.9 C), Max:99.2 F (37.3 C)  Recent Labs  Lab 10/21/20 0534 10/22/20 0612 10/23/20 0430 10/24/20 0415 10/24/20 0923 10/27/20 0328  WBC 6.3 4.5 4.1 4.9  --  2.5*  CREATININE 0.92 0.89 0.78 0.87  --  0.78  VANCOTROUGH  --   --   --   --  12*  --     Estimated Creatinine Clearance: 183.6 mL/min (by C-G formula based on SCr of 0.78 mg/dL).    Allergies  Allergen Reactions  . Augmentin [Amoxicillin-Pot Clavulanate] Other (See Comments)    Pt does not recall, childhood  . Ceclor [Cefaclor] Other (See Comments)    Pt does not recall, childhood  . Sulfa Antibiotics Hives    Antimicrobials this admission: 4/24 vancomycin >> 4/24 cefepime >> 4/28  Dose adjustments this admission: 4/28 VR 30 - hold vancomycin 4/29 VR 11 - start 2g IV q24h 5/4 VP 45, VT 8;  AUC 575 on 2g IV q24 > 1g IV q12h (eAUC 575) 5/11 VT 12   Microbiology results: 4/24 BCx: ngtd (collected prior to abx) 4/25 MRSA PCR +  4/26 pleural tissue: staph aureus > MRSA 4/26 pleural fluid: staph aureus > MRSA 5/2 epidural abscess: MRSA  5/26, PharmD, BCPS, BCCP Clinical Pharmacist  Please check AMION for all [facility] Pharmacy phone numbers After 10:00 PM, call Main Pharmacy [phone]

## 2020-10-27 NOTE — Progress Notes (Signed)
PROGRESS NOTE    Derek Woods  MMN:[id number] DOB: [birth year] DOA: 10/07/2020 PCP: Pcp, No   Brief Narrative:  Derek Woods is a 30 y.o. M, with a pertinent pmx of daily heroin IVDA (previous suboxone use), asthma, morbid obesity (BMI 52), 1 PPD smoking, possible OSA, presented to [facility] on 4/24 with complaints of back pain, right flank pain, and one week with shortness of breath . CT of his chest revealed a large right empyema. He was admitted to [facility] by the hospitalist team.  He was taken to the OR on 4/26 with Dr. Laneta Simmers for a VATS and drainage of right empyema, however a right thoracotomy had to be prefomed. He was a difficult intubation for the procedure and he was unable to be weaned from the ventilator post operatively. There was reported difficulty with oxygenation during the procedure. PCCM was consulted for ventilator management who assumed care as primary team.  Patient was extubated on 10/10/2020 and was transferred under [facility] care on 10/11/2020.  Posterior chest tube was removed on 10/12/2020.  Thoracotomy culture growing MRSA.  Antibiotics de-escalated to vancomycin.  ID consulted on 10/13/2020.  MRI thoracic spine was done which showed T11/T12 discitis and possible abscess. Status post decompressive thoracic laminectomy T11-12 by neurosurgery on 10/15/2020.  Hep C antibodies positive.   Assessment & Plan:  Acute respiratory failure with hypercarbia and hypoxia/right MRSA empyema s/p right thoracotomy on 10/09/2020/suspected OSA/OHS  Patient required intubation on 10/09/2020 and was extubated on 10/10/2020.   S/p thoracotomy as well as chest tube.  Cardiothoracic surgery has been following intermittently.  Chest tubes were removed.  Sutures were removed as well.   Cultures growing MRSA.  He is on vancomycin which we will continue. As per ID follow-up 5/5, recommend at least 6 weeks of IV antibiotics (vancomycin per pharmacy) from date of source control on 5/2 given severity of infection involving  lung and spine.  Patient agreeable to complete 6 weeks of IV antibiotics.  Tentative end date: 11/27/2020.  ID indicates that if he changes his mind or decides to leave AMA, can consider alternative options like oritavancin at that time.  ID to be consulted again when he is approaching end date of antibiotics for reevaluation for transition to oral antibiotics +/- reimaging.   Patient mentions that pain is reasonably well controlled.  Continue current medications.  See below.  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  Hemovac discontinued 5/4.  Lumbar MRI shows no evidence of infection.  Neurosurgery signed off 5/5 and recommend outpatient follow-up in 2 weeks (patient however will still be in the hospital and they can be called back).   Antibiotic regimen per ID as noted above.    Acute back pain as a result of above Multimodality pain control: Remains on Suboxone 4 mg twice a day, acetaminophen 1 g 3 times a day.  Completed 5-day course of as needed Toradol.  Was briefly on ibuprofen which was not effective so was transitioned back to PRN oral Toradol with Pepcid.  Naproxen was added on a scheduled basis.   Flexeril was changed over to Robaxin.  Remains on topical lidocaine. Pain seems to be reasonably well controlled.  Patient was encouraged to mobilize himself regularly.  Suspected OSA/OHS Will need outpatient valuation.  Anxiety Continue as needed hydroxyzine.  Essential hypertension/hyponatremia Started on amlodipine with good control of blood pressure.  Sodium levels have been fluctuating.  Stable.  Polysubstance abuse/IVDU Continue Suboxone.  Avoid other opioids. Plan is to continue after discharge and follow-up at internal medicine Suboxone clinic/Dr. Oswaldo Done Patient will need to call the clinic closer to discharge to arrange for intake interview  Normocytic anemia/leukopenia Stable.   Monitor periodically. Low WBC noted today.  Check vitamin B12, folic acid levels along with anemia panel.  We will recheck in a few days.  Tobacco dependence Smoking cessation counseling provided.  Positive hepatitis C antibody No known previous history of hepatitis C infection.  Antibodies positive.  ID will follow up HCV RNA and have made a follow-up appointment at [facility] for HCV treatment on 6/21 at 8:45 AM.  Morbid obesity Estimated body mass index is 52.2 kg/m as calculated from the following:   Height as of this encounter: 5\' 6"  (1.676 m).   Weight as of this encounter: 146.7 kg.   DVT prophylaxis: Lovenox   Code Status: Full Code  Family Communication: None at bedside. Disposition: We will need to stay in the hospital till completion of IV antibiotic course.   Status is: Inpatient  Remains inpatient appropriate because:Inpatient level of care appropriate due to severity of illness   Dispo: The patient is from: Home              Anticipated d/c is to: Home              Patient currently is not medically stable to d/c.   Difficult to place patient: Yes    Consultants:   CT surgery  ID  Neurosurgery  Procedures:   Chest tube placement, thoracotomy and intubation  Right upper arm PICC line.  S/p decompressive thoracic laminectomy T11-12 on 10/15/2020  Antimicrobials:  Anti-infectives (From admission, onward)   Start     Dose/Rate Route Frequency Ordered Stop   10/18/20 1000  vancomycin (VANCOREADY) IVPB 1000 mg/200 mL        1,000 mg 200 mL/hr over 60 Minutes Intravenous Every 12 hours 10/17/20 1601     10/15/20 1830  ceFAZolin (ANCEF) IVPB 2g/100 mL premix        2 g 200 mL/hr over 30 Minutes Intravenous Every 8 hours 10/15/20 1744 10/16/20 0659   10/13/20 1200  vancomycin (VANCOREADY) IVPB 2000 mg/400 mL  Status:  Discontinued        2,000 mg 200 mL/hr over 120 Minutes Intravenous Every 24 hours 10/12/20 1115 10/17/20 1559   10/12/20 1215  vancomycin  (VANCOREADY) IVPB 500 mg/100 mL        500 mg 100 mL/hr over 60 Minutes Intravenous  Once 10/12/20 1115 10/12/20 1329   10/12/20 0600  vancomycin (VANCOREADY) IVPB 1500 mg/300 mL  Status:  Discontinued        1,500 mg 150 mL/hr over 120 Minutes Intravenous Every 24 hours 10/12/20 0541 10/12/20 0556   10/12/20 0600  vancomycin (VANCOREADY) IVPB 1500 mg/300 mL  Status:  Discontinued        1,500 mg 150 mL/hr over 120 Minutes Intravenous Every 18 hours 10/12/20 0556 10/12/20 1115   10/11/20 1343  vancomycin variable dose per unstable renal function (pharmacist dosing)  Status:  Discontinued         Does not apply See admin instructions 10/11/20 1344 10/12/20 0556   10/10/20 0400  vancomycin (VANCOREADY) IVPB 1250 mg/250 mL  Status:  Discontinued        1,250 mg 166.7 mL/hr over 90 Minutes Intravenous Every 8 hours 10/09/20 1835 10/11/20 1344   10/09/20 2000  vancomycin (VANCOREADY) IVPB 1250 mg/250 mL  Status:  Discontinued        1,250 mg 166.7 mL/hr over 90 Minutes Intravenous Every 8 hours 10/09/20 1117 10/09/20 1835   10/09/20 1900  vancomycin (VANCOCIN) 2,500 mg in sodium chloride 0.9 % 500 mL IVPB        2,500 mg 250 mL/hr over 120 Minutes Intravenous  Once 10/09/20 1835 10/09/20 2137   10/09/20 1200  vancomycin (VANCOCIN) 2,500 mg in sodium chloride 0.9 % 500 mL IVPB  Status:  Discontinued        2,500 mg 250 mL/hr over 120 Minutes Intravenous  Once 10/09/20 1113 10/09/20 1835   10/07/20 2200  vancomycin (VANCOREADY) IVPB 2000 mg/400 mL  Status:  Discontinued        2,000 mg 200 mL/hr over 120 Minutes Intravenous Every 12 hours 10/07/20 1251 10/07/20 1437   10/07/20 2000  ceFEPIme (MAXIPIME) 2 g in sodium chloride 0.9 % 100 mL IVPB  Status:  Discontinued        2 g 200 mL/hr over 30 Minutes Intravenous Every 8 hours 10/07/20 1251 10/11/20 1342   10/07/20 1145  ceFEPIme (MAXIPIME) 2 g in sodium chloride 0.9 % 100 mL IVPB        2 g 200 mL/hr over 30 Minutes Intravenous  Once  10/07/20 1048 10/07/20 1351   10/07/20 1100  vancomycin (VANCOCIN) 2,500 mg in sodium chloride 0.9 % 500 mL IVPB  Status:  Discontinued        2,500 mg 250 mL/hr over 120 Minutes Intravenous  Once 10/07/20 1048 10/07/20 1437         Subjective: Patient mentions that pain is reasonably well controlled.  Denies any new complaints. Having regular bowel movements.    Objective: Vitals:   10/26/20 2010 10/26/20 2318 10/27/20 0329 10/27/20 0748  BP: 137/74 130/72 122/81 127/74  Pulse: 99 100 92 89  Resp: 19 18 19 20   Temp: 99.2 F (37.3 C) 98.9 F (37.2 C)  98.4 F (36.9 C)  TempSrc: Oral Oral  Oral  SpO2: 97% 98% 100% 96%  Weight:      Height:        Intake/Output Summary (Last 24 hours) at 10/27/2020 0953 Last data filed at 10/27/2020 0325 Gross per 24 hour  Intake 440 ml  Output 600 ml  Net -160 ml   Filed Weights   10/07/20 1006  Weight: (!) 146.7 kg    Examination:  General appearance: Awake alert.  In no distress Resp: Clear to auscultation bilaterally.  Normal effort Cardio: S1-S2 is normal regular.  No S3-S4.  No rubs murmurs or bruit GI: Abdomen is soft.  Nontender nondistended.  Bowel sounds are present normal.  No masses organomegaly Extremities: No edema.  Full range of motion of lower extremities. Neurologic: Alert and oriented x3.  No focal neurological deficits.      Data Reviewed: I have personally reviewed following labs and imaging studies  CBC: Recent Labs  Lab 10/21/20 0534 10/22/20 0612 10/23/20 0430 10/24/20 0415 10/27/20 0328  WBC 6.3 4.5 4.1 4.9 2.5*  NEUTROABS 3.5 2.5 2.3 2.7  --   HGB 10.4* 9.2* 9.0* 10.3* 10.5*  HCT 33.5* 29.1* 28.4* 32.4* 32.5*  MCV 95.4 94.5 95.0 93.9 92.1  PLT 359 276 287 320 240   Basic Metabolic Panel: Recent Labs  Lab 10/21/20 0534 10/22/20 0612 10/23/20 0430 10/24/20 0415 10/27/20 0328  NA 135 134* 133* 135 132*  K 5.0 4.8 4.4 4.6 4.5  CL 101 102 103 103 100  CO2 27  26 25 25 23   GLUCOSE 119*  108* 125* 126* 101*  BUN 22* 23* 22* 18 20  CREATININE 0.92 0.89 0.78 0.87 0.78  CALCIUM 9.3 9.0 8.8* 9.3 9.0   GFR: Estimated Creatinine Clearance: 183.6 mL/min (by C-G formula based on SCr of 0.78 mg/dL). Liver Function Tests: Recent Labs  Lab 10/24/20 0415  AST 26  ALT 20  ALKPHOS 108  BILITOT 0.6  PROT 7.2  ALBUMIN 2.8*     No results found for this or any previous visit (from the past 240 hour(s)).    Radiology Studies: No results found.  Scheduled Meds: . acetaminophen  1,000 mg Oral TID  . amLODipine  5 mg Oral Daily  . buprenorphine-naloxone  2 tablet Sublingual BID  . Chlorhexidine Gluconate Cloth  6 each Topical Daily  . docusate sodium  100 mg Oral BID  . enoxaparin (LOVENOX) injection  70 mg Subcutaneous Q24H  . famotidine  20 mg Oral BID  . lidocaine  1 patch Transdermal Q24H  . mouth rinse  15 mL Mouth Rinse BID  . naproxen  250 mg Oral BID WC  . nicotine  14 mg Transdermal Daily  . pantoprazole  40 mg Oral QHS  . polyethylene glycol  17 g Oral Daily  . pregabalin  50 mg Oral QHS  . senna-docusate  2 tablet Oral QHS  . sodium chloride flush  10-40 mL Intracatheter Q12H  . sodium chloride flush  3 mL Intravenous Q12H  . sodium chloride flush  3 mL Intravenous Q12H   Continuous Infusions: . sodium chloride Stopped (10/16/20 1455)  . vancomycin 1,000 mg (10/27/20 0929)     LOS: 20 days     Osvaldo ShipperGokul Marili Vader, MD [facility]  10/27/2020, 9:53 AM   To contact the attending provider between 7A-7P or the covering provider during after hours 7P-7A, please log into the web site [email or web address].

## 2020-10-28 NOTE — Progress Notes (Signed)
PROGRESS NOTE    Bary Limbach  QMV:[id number] DOB: February 12, 1991 DOA: 10/07/2020 PCP: Pcp, No   Brief Narrative:  Derek Woods is a 30 y.o. M, with a pertinent pmx of daily heroin IVDA (previous suboxone use), asthma, morbid obesity (BMI 52), 1 PPD smoking, possible OSA, presented to [facility] on 4/24 with complaints of back pain, right flank pain, and one week with shortness of breath . CT of his chest revealed a large right empyema. He was admitted to [facility] by the hospitalist team.  He was taken to the OR on 4/26 with Dr. Laneta Simmers for a VATS and drainage of right empyema, however a right thoracotomy had to be prefomed. He was a difficult intubation for the procedure and he was unable to be weaned from the ventilator post operatively. There was reported difficulty with oxygenation during the procedure. PCCM was consulted for ventilator management who assumed care as primary team.  Patient was extubated on 10/10/2020 and was transferred under [facility] care on 10/11/2020.  Posterior chest tube was removed on 10/12/2020.  Thoracotomy culture growing MRSA.  Antibiotics de-escalated to vancomycin.  ID consulted on 10/13/2020.  MRI thoracic spine was done which showed T11/T12 discitis and possible abscess. Status post decompressive thoracic laminectomy T11-12 by neurosurgery on 10/15/2020.  Hep C antibodies positive.   Assessment & Plan:  Acute respiratory failure with hypercarbia and hypoxia/right MRSA empyema s/p right thoracotomy on 10/09/2020/suspected OSA/OHS  Patient required intubation on 10/09/2020 and was extubated on 10/10/2020.   S/p thoracotomy as well as chest tube.  Cardiothoracic surgery has been following intermittently.  Chest tubes were removed.  Sutures were removed as well.   Cultures growing MRSA.  He is on vancomycin which we will continue. As per ID follow-up 5/5, recommend at least 6 weeks of IV antibiotics (vancomycin per pharmacy) from date of source control on 5/2 given severity of infection involving  lung and spine.  Patient agreeable to complete 6 weeks of IV antibiotics.  Tentative end date: 11/27/2020.  ID indicates that if he changes his mind or decides to leave AMA, can consider alternative options like oritavancin at that time.  ID to be consulted again when he is approaching end date of antibiotics for reevaluation for transition to oral antibiotics +/- reimaging.    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  Hemovac discontinued 5/4.  Lumbar MRI shows no evidence of infection.  Neurosurgery signed off 5/5 and recommend outpatient follow-up in 2 weeks (patient however will still be in the hospital and they can be called back).   Antibiotic regimen per ID as noted above.    Acute back pain as a result of above Multimodality pain control: Remains on Suboxone 4 mg twice a day, acetaminophen 1 g 3 times a day.  Completed 5-day course of as needed Toradol.  Was briefly on ibuprofen which was not effective so was transitioned back to PRN oral Toradol with Pepcid.  Naproxen was added on a scheduled basis.   Flexeril was changed over to Robaxin.  Remains on topical lidocaine. Patient mentions that pain is very well controlled.  He required Toradol only once yesterday.  In view of this we will continue current dose of naproxen.   Patient was encouraged to mobilize himself regularly.  Suspected OSA/OHS Will need outpatient evaluation.  Anxiety Continue as needed hydroxyzine.  Essential hypertension/hyponatremia Started on amlodipine with good control of blood pressure.  Sodium levels have been fluctuating.  Stable.  Polysubstance abuse/IVDU Continue Suboxone.  Avoid other opioids. Plan is to continue after discharge and follow-up at internal medicine Suboxone clinic/Dr. Oswaldo DoneVincent Patient will need to call the clinic closer to discharge to arrange for intake interview  Normocytic  anemia/leukopenia Stable.  Monitor periodically. Low WBC noted on 5/14.  Check vitamin B12, folic acid levels along with anemia panel.  We will recheck in a few days.  Tobacco dependence Smoking cessation counseling provided.  Positive hepatitis C antibody No known previous history of hepatitis C infection.  Antibodies positive.  ID will follow up HCV RNA and have made a follow-up [facility] for HCV treatment on 6/21 at 8:45 AM.  Morbid obesity Estimated body mass index is 52.2 kg/m as calculated from the following:   Height as of this encounter: 5\' 6"  (1.676 m).   Weight as of this encounter: 146.7 kg.   DVT prophylaxis: Lovenox   Code Status: Full Code  Family Communication: None at bedside. Disposition: We will need to stay in the hospital till completion of IV antibiotic course.   Status is: Inpatient  Remains inpatient appropriate because:Inpatient level of care appropriate due to severity of illness   Dispo: The patient is from: Home              Anticipated d/c is to: Home              Patient currently is not medically stable to d/c.   Difficult to place patient: Yes    Consultants:   CT surgery  ID  Neurosurgery  Procedures:   Chest tube placement, thoracotomy and intubation  Right upper arm PICC line.  S/p decompressive thoracic laminectomy T11-12 on 10/15/2020  Antimicrobials:  Anti-infectives (From admission, onward)   Start     Dose/Rate Route Frequency Ordered Stop   10/18/20 1000  vancomycin (VANCOREADY) IVPB 1000 mg/200 mL        1,000 mg 200 mL/hr over 60 Minutes Intravenous Every 12 hours 10/17/20 1601 11/25/20 2359   10/15/20 1830  ceFAZolin (ANCEF) IVPB 2g/100 mL premix        2 g 200 mL/hr over 30 Minutes Intravenous Every 8 hours 10/15/20 1744 10/16/20 0659   10/13/20 1200  vancomycin (VANCOREADY) IVPB 2000 mg/400 mL  Status:  Discontinued        2,000 mg 200 mL/hr over 120 Minutes Intravenous Every 24 hours 10/12/20 1115  10/17/20 1559   10/12/20 1215  vancomycin (VANCOREADY) IVPB 500 mg/100 mL        500 mg 100 mL/hr over 60 Minutes Intravenous  Once 10/12/20 1115 10/12/20 1329   10/12/20 0600  vancomycin (VANCOREADY) IVPB 1500 mg/300 mL  Status:  Discontinued        1,500 mg 150 mL/hr over 120 Minutes Intravenous Every 24 hours 10/12/20 0541 10/12/20 0556   10/12/20 0600  vancomycin (VANCOREADY) IVPB 1500 mg/300 mL  Status:  Discontinued        1,500 mg 150 mL/hr over 120 Minutes Intravenous Every 18 hours 10/12/20 0556 10/12/20 1115   10/11/20 1343  vancomycin variable dose per unstable renal function (pharmacist dosing)  Status:  Discontinued         Does not apply See admin instructions 10/11/20 1344 10/12/20 0556   10/10/20 0400  vancomycin (VANCOREADY) IVPB 1250 mg/250 mL  Status:  Discontinued        1,250 mg 166.7 mL/hr over 90 Minutes Intravenous Every 8 hours 10/09/20 1835 10/11/20 1344   10/09/20 2000  vancomycin (VANCOREADY)  IVPB 1250 mg/250 mL  Status:  Discontinued        1,250 mg 166.7 mL/hr over 90 Minutes Intravenous Every 8 hours 10/09/20 1117 10/09/20 1835   10/09/20 1900  vancomycin (VANCOCIN) 2,500 mg in sodium chloride 0.9 % 500 mL IVPB        2,500 mg 250 mL/hr over 120 Minutes Intravenous  Once 10/09/20 1835 10/09/20 2137   10/09/20 1200  vancomycin (VANCOCIN) 2,500 mg in sodium chloride 0.9 % 500 mL IVPB  Status:  Discontinued        2,500 mg 250 mL/hr over 120 Minutes Intravenous  Once 10/09/20 1113 10/09/20 1835   10/07/20 2200  vancomycin (VANCOREADY) IVPB 2000 mg/400 mL  Status:  Discontinued        2,000 mg 200 mL/hr over 120 Minutes Intravenous Every 12 hours 10/07/20 1251 10/07/20 1437   10/07/20 2000  ceFEPIme (MAXIPIME) 2 g in sodium chloride 0.9 % 100 mL IVPB  Status:  Discontinued        2 g 200 mL/hr over 30 Minutes Intravenous Every 8 hours 10/07/20 1251 10/11/20 1342   10/07/20 1145  ceFEPIme (MAXIPIME) 2 g in sodium chloride 0.9 % 100 mL IVPB        2 g 200  mL/hr over 30 Minutes Intravenous  Once 10/07/20 1048 10/07/20 1351   10/07/20 1100  vancomycin (VANCOCIN) 2,500 mg in sodium chloride 0.9 % 500 mL IVPB  Status:  Discontinued        2,500 mg 250 mL/hr over 120 Minutes Intravenous  Once 10/07/20 1048 10/07/20 1437         Subjective: Patient mentions that pain is much better controlled now.  Denies any other complaints.  Having regular bowel movements.  Objective: Vitals:   10/27/20 1954 10/27/20 2336 10/28/20 0354 10/28/20 0733  BP: (!) 157/78 137/73 115/77 119/70  Pulse: 100 89 85 92  Resp: 20 18 18 20   Temp: 99.4 F (37.4 C) 98.5 F (36.9 C) 98.5 F (36.9 C) 99.1 F (37.3 C)  TempSrc: Oral Oral Oral Oral  SpO2: 99% 98% 97% 98%  Weight:      Height:        Intake/Output Summary (Last 24 hours) at 10/28/2020 0935 Last data filed at 10/28/2020 0700 Gross per 24 hour  Intake 480 ml  Output 2175 ml  Net -1695 ml   Filed Weights   10/07/20 1006  Weight: (!) 146.7 kg    Examination:  General appearance: Awake alert.  In no distress Resp: Clear to auscultation bilaterally.  Normal effort Cardio: S1-S2 is normal regular.  No S3-S4.  No rubs murmurs or bruit GI: Abdomen is soft.  Nontender nondistended.  Bowel sounds are present normal.  No masses organomegaly      Data Reviewed: I have personally reviewed following labs and imaging studies  CBC: Recent Labs  Lab 10/22/20 0612 10/23/20 0430 10/24/20 0415 10/27/20 0328  WBC 4.5 4.1 4.9 2.5*  NEUTROABS 2.5 2.3 2.7  --   HGB 9.2* 9.0* 10.3* 10.5*  HCT 29.1* 28.4* 32.4* 32.5*  MCV 94.5 95.0 93.9 92.1  PLT 276 287 320 240   Basic Metabolic Panel: Recent Labs  Lab 10/22/20 0612 10/23/20 0430 10/24/20 0415 10/27/20 0328  NA 134* 133* 135 132*  K 4.8 4.4 4.6 4.5  CL 102 103 103 100  CO2 26 25 25 23   GLUCOSE 108* 125* 126* 101*  BUN 23* 22* 18 20  CREATININE 0.89 0.78 0.87 0.78  CALCIUM  9.0 8.8* 9.3 9.0   GFR: Estimated Creatinine Clearance: 183.6  mL/min (by C-G formula based on SCr of 0.78 mg/dL). Liver Function Tests: Recent Labs  Lab 10/24/20 0415  AST 26  ALT 20  ALKPHOS 108  BILITOT 0.6  PROT 7.2  ALBUMIN 2.8*     No results found for this or any previous visit (from the past 240 hour(s)).    Radiology Studies: No results found.  Scheduled Meds: . acetaminophen  1,000 mg Oral TID  . amLODipine  5 mg Oral Daily  . buprenorphine-naloxone  2 tablet Sublingual BID  . Chlorhexidine Gluconate Cloth  6 each Topical Daily  . docusate sodium  100 mg Oral BID  . enoxaparin (LOVENOX) injection  70 mg Subcutaneous Q24H  . lidocaine  1 patch Transdermal Q24H  . mouth rinse  15 mL Mouth Rinse BID  . naproxen  250 mg Oral BID WC  . nicotine  14 mg Transdermal Daily  . pantoprazole  40 mg Oral QHS  . polyethylene glycol  17 g Oral Daily  . pregabalin  50 mg Oral QHS  . senna-docusate  2 tablet Oral QHS  . sodium chloride flush  10-40 mL Intracatheter Q12H  . sodium chloride flush  3 mL Intravenous Q12H  . sodium chloride flush  3 mL Intravenous Q12H   Continuous Infusions: . sodium chloride Stopped (10/16/20 1455)  . vancomycin 1,000 mg (10/27/20 2146)     LOS: 21 days     Osvaldo Shipper, MD [facility]  10/28/2020, 9:35 AM   To contact the attending provider between 7A-7P or the covering provider during after hours 7P-7A, please log into the web site [email or web address].

## 2020-10-29 LAB — CBC WITH DIFFERENTIAL/PLATELET
Abs Immature Granulocytes: 0.01 10*3/uL (ref 0.00–0.07)
Basophils Absolute: 0 10*3/uL (ref 0.0–0.1)
Basophils Relative: 0 %
Eosinophils Absolute: 0.2 10*3/uL (ref 0.0–0.5)
Eosinophils Relative: 8 %
HCT: 28.1 % — ABNORMAL LOW (ref 39.0–52.0)
Hemoglobin: 8.9 g/dL — ABNORMAL LOW (ref 13.0–17.0)
Immature Granulocytes: 1 %
Lymphocytes Relative: 43 %
Lymphs Abs: 0.8 10*3/uL (ref 0.7–4.0)
MCH: 29.7 pg (ref 26.0–34.0)
MCHC: 31.7 g/dL (ref 30.0–36.0)
MCV: 93.7 fL (ref 80.0–100.0)
Monocytes Absolute: 0.1 10*3/uL (ref 0.1–1.0)
Monocytes Relative: 6 %
Neutro Abs: 0.8 10*3/uL — ABNORMAL LOW (ref 1.7–7.7)
Neutrophils Relative %: 42 %
Platelets: 177 10*3/uL (ref 150–400)
RBC: 3 MIL/uL — ABNORMAL LOW (ref 4.22–5.81)
RDW: 14.7 % (ref 11.5–15.5)
WBC: 1.9 10*3/uL — ABNORMAL LOW (ref 4.0–10.5)
nRBC: 0 % (ref 0.0–0.2)

## 2020-10-29 LAB — FOLATE: Folate: 6.5 ng/mL (ref >5.9)

## 2020-10-29 LAB — VITAMIN B12: Vitamin B-12: 355 pg/mL (ref 180–914)

## 2020-10-29 LAB — RETICULOCYTES
Immature Retic Fract: 22.2 % — ABNORMAL HIGH (ref 2.3–15.9)
RBC.: 3.09 MIL/uL — ABNORMAL LOW (ref 4.22–5.81)
Retic Count, Absolute: 105.1 10*3/uL (ref 19.0–186.0)
Retic Ct Pct: 3.4 % — ABNORMAL HIGH (ref 0.4–3.1)

## 2020-10-29 LAB — IRON AND TIBC
Iron: 41 ug/dL — ABNORMAL LOW (ref 45–182)
Saturation Ratios: 11 % — ABNORMAL LOW (ref 17.9–39.5)
TIBC: 381 ug/dL (ref 250–450)
UIBC: 340 ug/dL

## 2020-10-29 LAB — FERRITIN: Ferritin: 60 ng/mL (ref 24–336)

## 2020-10-29 MED ORDER — FERROUS SULFATE 325 (65 FE) MG PO TABS
325.0000 mg | ORAL_TABLET | Freq: Two times a day (BID) | ORAL | Status: DC
  Administered 2020-10-29 – 2020-11-27 (×58): 325 mg via ORAL
  Filled 2020-10-29 (×59): qty 1

## 2020-10-29 NOTE — Progress Notes (Signed)
Physical Therapy Treatment Patient Details Name: Derek Woods MRN: [id number] DOB: [birth year] Today's Date: 10/29/2020    History of Present Illness [name] 30 yo male who presented on 10/07/20 with shortness of breath and found to have large R empyema.  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  Medical hx includes daily heroin IVDA (previous suboxone use), asthma, morbid obesity, OSA, and smokes.    PT Comments    Pt was seen for mobility after being able to get OOB.  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  Has a heavy lean on RW to support back, and talked with him about getting pain meds requested faster.  [name] feeling he has waited and is offered Tylenol first post op, and agreed that PT could give this information to medical staff.  Follow for goals of PT.    Follow Up Recommendations  Home health PT;Supervision for mobility/OOB     Equipment Recommendations  Rolling walker with 5" wheels;3in1 (PT)    Recommendations for Other Services       Precautions / Restrictions Precautions Precautions: Back Precaution Comments: reinforced back precautions Restrictions Weight Bearing Restrictions: No (Simultaneous filing. User may not have seen previous data.)    Mobility  Bed Mobility Overal bed mobility: Needs Assistance Bed Mobility: Rolling;Supine to Sit;Sit to Supine Rolling: Supervision Sidelying to sit: Supervision Supine to sit: Supervision Sit to supine: Supervision Sit to sidelying: Supervision      Transfers Overall transfer level: Needs assistance Equipment used: Rolling walker (2  wheeled) Transfers: Sit to/from Stand Sit to Stand: Supervision         General transfer comment: S with RW observed  Ambulation/Gait Ambulation/Gait assistance: Min guard Gait Distance (Feet): 200 Feet Assistive device: Rolling walker (2 wheeled) Gait Pattern/deviations: Step-to pattern;Step-through pattern;Trunk flexed;Wide base of support Gait velocity: decreased   General Gait Details: RW and postural cues, reminders for upright movement   Stairs             Wheelchair Mobility    Modified Rankin (Stroke Patients Only)       Balance Overall balance assessment: Needs assistance Sitting-balance support: Feet supported Sitting balance-Leahy Scale: Normal     Standing balance support: Bilateral upper extremity supported;During functional activity Standing balance-Leahy Scale: Poor Standing balance comment: requires RW for stediness and maangement of pain                            Cognition Arousal/Alertness: Awake/alert Behavior During Therapy: WFL for tasks assessed/performed Overall Cognitive Status: Within Functional Limits for tasks assessed                                        Exercises General Exercises - Lower Extremity Ankle Circles/Pumps: AROM;5 reps Quad Sets: AROM;10 reps Gluteal Sets: AROM;10 reps Heel Slides: AAROM;10 reps Hip ABduction/ADduction: AROM;10 reps Hip Flexion/Marching: AROM;10 reps    General Comments General comments (skin integrity, edema, etc.): pt is walking on hallway with necessary stop to rest  and then cues given for recovery of post control      Pertinent Vitals/Pain Pain Assessment: 0-10 Pain Score: 5  Pain Location: back, R flank Pain Descriptors / Indicators: Grimacing;Guarding Pain Intervention(s): Monitored during session;Repositioned;Other (comment) (offered heat and ice but pt declined)    Home Living                      Prior Function            PT Goals  (current goals can now be found in the care plan section) Acute Rehab PT Goals Patient Stated Goal: walk and go home Progress towards PT goals: Progressing toward goals    Frequency    Min 3X/week      PT Plan Current plan remains appropriate    Co-evaluation              AM-PAC PT "6 Clicks" Mobility   Outcome Measure  Help needed turning from your back to your side while in a flat bed without using bedrails?: A Little Help needed moving from lying on your back to sitting on the side of a flat bed without using bedrails?: A Little Help needed moving to and from a bed to a chair (including a wheelchair)?: A Little Help needed standing up from a chair using your arms (e.g., wheelchair or bedside chair)?: A Little Help needed to walk in hospital room?: A Little Help needed climbing 3-5 steps with a railing? : A Lot 6 Click Score: 17    End of Session Equipment Utilized During Treatment: Other (comment) Activity Tolerance: Patient tolerated treatment well Patient left: in bed;with call bell/phone within reach Nurse Communication: Mobility status PT Visit Diagnosis: Other abnormalities of gait and mobility (R26.89);Muscle weakness (generalized) (M62.81);Pain Pain - Right/Left: Right Pain - part of body:  (back)     Time: 9937-1696 PT Time Calculation (min) (ACUTE ONLY): 34 min  Charges:  $Gait Training: 8-22 mins $Therapeutic Exercise: 8-22 mins              Ivar Drape 10/29/2020, 8:56 PM  Samul Dada, PT MS Acute Rehab Dept. Number: [facility] [id number] and [facility] [phone]

## 2020-10-29 NOTE — Progress Notes (Signed)
PT Cancellation Note  Patient Details Name: Derek Woods MRN: [id number] DOB: Mar 31, 1991   Cancelled Treatment:    Reason Eval/Treat Not Completed: Patient declined, no reason specified.  Stated he is not ready yet, to come back another time.  Retry as time and pt allow.   Derek Woods 10/29/2020, 12:58 PM  Derek Woods, PT MS Acute Rehab Dept. Number: [facility] [id number] and [facility] [phone]

## 2020-10-29 NOTE — Progress Notes (Signed)
[facility] PROGRESS NOTE  Adi Doro SNK:[id number] DOB: [birth year] DOA: 10/07/2020 PCP: Pcp, No  Status: Remains inpatient appropriate because:Unsafe d/c plan, IV treatments appropriate due to intensity of illness or inability to take PO and Inpatient level of care appropriate due to severity of illness   Dispo: The patient is from: Home              Anticipated d/c is to: Home to live with mother who is disabled secondary to rheumatoid arthritis.  Post discharge transportation will be an issue.  This patient lives in the [place] area but will need to come to [facility] for the Suboxone clinic as well as for ID clinic and given this we will also arrange for at least 1 PCP appointment after discharge in the [place] area.              Patient currently is not medically stable to d/c.   Difficult to place patient Yes   Level of care: Med-Surg  Code Status: Full Family Communication: Patient DVT prophylaxis: Lovenox Vaccination status: Unknown   HPI: 30 year old male with past medical history of daily IV drug abuse with heroin and has used Suboxone from the streets.  He also has asthma, morbid obesity with a BMI of 52, daily smoker, suspected sleep apnea.  He presented to [facility] on 4/24 with back pain, right flank pain and shortness of breath x1 week.  CT of the chest consistent with large right empyema and he was admitted by the hospitalist team.  Subsequently taken to the OR on 4/26 for VATS and drainage of right empyema but due to severity of disease thoracotomy was completed instead.  He was a difficult intubation prior to the procedure and was unable to be weaned from the ventilator postoperatively.  There were also concerns of possible difficulty with appropriate oxygenation during the procedure.  He was subsequently admitted to the ICU under the care of PCCM.  Eventually extubated on 4/27 and transferred out of ICU on 4/28.  Posterior chest tube removed 4/29.   Intraoperative cultures grew MRSA.  Antibiotics have been de-escalated to vancomycin.  ID consulted on 4/30.  MRI of thoracic spine was done and demonstrated T11/T12 discitis and possible abscess.  Neurosurgery was consulted and he subsequently underwent decompressive thoracic laminectomy on 5/2.  Incidental finding of hep C positive antibodies.   Subjective: Awakened.  Somewhat moody today which is not typical of my previous interactions with him.  Discussed discharge planning.  Patient does report he will have some difficulty with transportation since his mother does not drive and neither the motor vehicle.  He does live in [place] which makes coming to [facility] for appointments difficult but it is also important to note that the clinics in the [place] area are overbooked and cannot see patients for months and Suboxone clinic in [place] would require out-of-pocket fees from patient.  Objective: Vitals:   10/29/20 0108 10/29/20 0424  BP: 126/80 119/69  Pulse: 96 92  Resp: 18   Temp: 98 F (36.7 C) 98.6 F (37 C)  SpO2: 97% 96%    Intake/Output Summary (Last 24 hours) at 10/29/2020 0800 Last data filed at 10/29/2020 0115 Gross per 24 hour  Intake 200 ml  Output 1850 ml  Net -1650 ml   Filed Weights   10/07/20 1006  Weight: (!) 146.7 kg    Exam:  Constitutional: Awake, pale, no acute distress.  Appears to be comfortable.  When asked reported sleeping better with the  addition of Lyrica at bedtime. Respiratory: Lungs are clear, stable on room air.  No increased work of breathing while supine in bed Cardiovascular: Heart sounds are normal, pulses regular, normotensive, no peripheral edema Abdomen: LBM 5/9, soft nontender nondistended with normoactive bowel sounds.  Eating well. Neurologic: CN 2-12 grossly intact. Sensation intact, Strength 5/5 x all 4 extremities.  Ambulates with rolling walker. Psychiatric: Awake, slightly standoffish today which is not his typical.  Oriented  x3.   Assessment/Plan: Acute problems: Acute respiratory failure with hypercarbia and hypoxia (Resolved)/Right MRSA empyema s/p right thoracotomy on 10/09/2020 Briefly intubated in the immediate postop period.  Continue vancomycin ID recommends duration of therapy to be 6 weeks from date of initial source control on 5/2 with last dose due on 11/27/2020.  If he changes his mind/decides to leave AMA, can consider alternative options like oritavancin.  ID to be consulted again when he is approaching end date of antibiotics for reevaluation for transition to oral antibiotics +/- reimaging.    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   Drains have been removed and follow-up lumbar MRI w/o evidence of infection.   Neurosurgery signed off 5/5 -recommend outpatient follow-up in 2 weeks  Continue Suboxone,Tylenol, naproxen with p.o. Toradol for breakthrough pain, topical lidocaine, Robaxin, bedtime Lyrica  Essential hypertension Norvasc initiated this hospitalization.   Polysubstance abuse/IVDU Continue Suboxone.  Avoid other opioids. Plan is to continue after discharge and follow-up at internal medicine Suboxone clinic/Dr. Oswaldo Done Patient will need to call the clinic closer to discharge to arrange for intake interview  Physical deconditioning Improving and able to ambulate at times for short distances without rolling walker-HHPT  Morbid obesity Estimated body mass index is 52.2 kg/m as calculated from the following:   Height as of this encounter: 5\' 6"  (1.676 m).   Weight as of this encounter: 146.7 kg.  Other problems: Positive hepatitis C antibody No known previous history of hepatitis C infection.  Antibodies positive.  ID will follow up HCV RNA and have made a follow-up appointment at [facility] for HCV  treatment on 6/21 at 8:45 AM.  Anxiety Continue as needed hydroxyzine.  Normocytic anemia/Iron deficiency anemia Hemoglobin is stable over 9.  Monitor CBC periodically. 5/16 iron 41 -begin PO iron  Tobacco dependence Smoking cessation counseling provided.   Data Reviewed: Basic Metabolic Panel: Recent Labs  Lab 10/23/20 0430 10/24/20 0415 10/27/20 0328  NA 133* 135 132*  K 4.4 4.6 4.5  CL 103 103 100  CO2 25 25 23   GLUCOSE 125* 126* 101*  BUN 22* 18 20  CREATININE 0.78 0.87 0.78  CALCIUM 8.8* 9.3 9.0   Liver Function Tests: Recent Labs  Lab 10/24/20 0415  AST 26  ALT 20  ALKPHOS 108  BILITOT 0.6  PROT 7.2  ALBUMIN 2.8*     CBC: Recent Labs  Lab 10/23/20 0430 10/24/20 0415 10/27/20 0328 10/29/20 0415  WBC 4.1 4.9 2.5* 1.9*  NEUTROABS 2.3 2.7  --  0.8*  HGB 9.0* 10.3* 10.5* 8.9*  HCT 28.4* 32.4* 32.5* 28.1*  MCV 95.0 93.9 92.1 93.7  PLT 287 320 240 177     Scheduled Meds: . acetaminophen  1,000 mg Oral TID  . amLODipine  5 mg Oral Daily  . buprenorphine-naloxone  2 tablet Sublingual BID  . Chlorhexidine Gluconate Cloth  6 each Topical Daily  . docusate sodium  100  mg Oral BID  . enoxaparin (LOVENOX) injection  70 mg Subcutaneous Q24H  . ferrous sulfate  325 mg Oral BID WC  . lidocaine  1 patch Transdermal Q24H  . mouth rinse  15 mL Mouth Rinse BID  . naproxen  250 mg Oral BID WC  . nicotine  14 mg Transdermal Daily  . pantoprazole  40 mg Oral QHS  . polyethylene glycol  17 g Oral Daily  . pregabalin  50 mg Oral QHS  . senna-docusate  2 tablet Oral QHS  . sodium chloride flush  10-40 mL Intracatheter Q12H  . sodium chloride flush  3 mL Intravenous Q12H  . sodium chloride flush  3 mL Intravenous Q12H   Continuous Infusions: . sodium chloride Stopped (10/16/20 1455)  . vancomycin 1,000 mg (10/28/20 2104)    Principal Problem:   Empyema lung (HCC) Active Problems:   Polysubstance dependence including opioid type drug with complication,  episodic abuse (HCC)   Morbid obesity with BMI of 50.0-59.9, adult (HCC)   Tobacco dependence   MRSA infection   Abscess in epidural space of thoracic spine   Consultants:  CT surgery  ID  Neurosurgery   Procedures:  Chest tube placement, thoracotomy and intubation  Right upper arm PICC line.  S/p decompressive thoracic laminectomy T11-12 on 10/15/2020   Antibiotics:  Vancomycin 4/24 >>  Maxipime 4/24 through 4/28  Ancef 5/2 through 5/3   Time spent: 35    Junious Silk ANP  [facility] 7 am - 330 pm/M-F for direct patient care and secure chat Please refer to Amion for contact info 22  days

## 2020-10-30 LAB — VANCOMYCIN, TROUGH: Vancomycin Tr: 10 ug/mL — ABNORMAL LOW (ref 15–20)

## 2020-10-30 LAB — BASIC METABOLIC PANEL
Anion gap: 5 (ref 5–15)
BUN: 19 mg/dL (ref 6–20)
CO2: 26 mmol/L (ref 22–32)
Calcium: 8.7 mg/dL — ABNORMAL LOW (ref 8.9–10.3)
Chloride: 103 mmol/L (ref 98–111)
Creatinine, Ser: 0.82 mg/dL (ref 0.61–1.24)
GFR, Estimated: >60 mL/min (ref >60)
Glucose, Bld: 138 mg/dL — ABNORMAL HIGH (ref 70–99)
Potassium: 4.5 mmol/L (ref 3.5–5.1)
Sodium: 134 mmol/L — ABNORMAL LOW (ref 135–145)

## 2020-10-30 LAB — VANCOMYCIN, PEAK: Vancomycin Pk: 30 ug/mL (ref 30–40)

## 2020-10-30 NOTE — Plan of Care (Signed)

## 2020-10-30 NOTE — Progress Notes (Signed)
PT Cancellation Note  Patient Details Name: Derek Woods MRN: [id number] DOB: Dec 26, 1990   Cancelled Treatment:    Reason Eval/Treat Not Completed: Patient declined, no reason specified. PT attempted. Pt declining, stating "Nope. I am not doing anything in the morning. I'm only willing to do therapy in the afternoon." PT to re-attempt as time allows.   Ilda Foil 10/30/2020, 8:20 AM   Aida Raider, PT  Office # [phone] Pager [phone]

## 2020-10-30 NOTE — Progress Notes (Addendum)
OT treatment:  Patient supine in bed and agreeable to OT session.  Pt completing transfers and mobility in room with supervision using RW.  LB dressing with AE with supervision.  Toileting with modified independence, but reports still having to call nursing assist after BM due to difficulty/pain when attempting to use toileting aide--discussed other techniques and pt agreeable to try. Updated goals today. Reached out to RN to see if pt can be cleared to shower by MD.  Updated dc plan to no OT follow up, pt progressing well and anticipate no further needs after dc. Will follow acutely.    10/30/20 1141  OT Visit Information  Last OT Received On 10/30/20  Assistance Needed +1  History of Present Illness [name] 30 yo male who presented on 10/07/20 with shortness of breath and found to have large R empyema.  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  Medical hx includes daily heroin IVDA (previous suboxone use), asthma, morbid obesity, OSA, and smokes.  Precautions  Precautions Back  Precaution Comments reinforced back precautions  Pain Assessment  Pain Assessment Faces  Faces Pain Scale 4  Pain Location back, R flank  Pain Descriptors / Indicators Grimacing;Guarding  Pain Intervention(s) Monitored during session;Repositioned  Cognition  Arousal/Alertness Awake/alert  Behavior During Therapy WFL for tasks assessed/performed  Overall Cognitive Status Within Functional Limits for tasks assessed  ADL  Overall ADL's  Needs assistance/impaired  Grooming Supervision/safety;Standing  Lower Body Dressing Supervision/safety;Sit to/from stand;With Film/video editor;Ambulation;Grab bars;RW  Statistician Details (indicate cue type and reason) standing at commode  Toileting-  Clothing Manipulation and Hygiene Modified independent;Sit to/from stand  Toileting - Clothing Manipulation Details (indicate cue type and reason) urine only, pt reports still having difficulty with hygeine after BM.  Discussed techniques with toileting aide, offered suggestions to pt to try and agreeable.  Tub/ Designer, fashion/clothing;Ambulation;3 in 1;Grab bars  Tub/Shower Transfer Details (indicate cue type and reason) supervision simulated, discussed safety  Functional mobility during ADLs Supervision/safety;Rolling walker  Bed Mobility  Overal bed mobility Needs Assistance  Rolling Supervision  Sidelying to sit Supervision  Sit to sidelying Supervision  General bed mobility comments supervision, good recall of log roll technique  Balance  Overall balance assessment Needs assistance  Sitting-balance support No upper extremity supported;Feet supported  Sitting balance-Leahy Scale Normal  Standing balance support Bilateral upper extremity supported;During functional activity;No upper extremity supported  Standing balance-Leahy Scale Fair  Standing balance comment preference to UE support  Transfers  Overall transfer level Needs assistance  Equipment used Rolling walker (2 wheeled)  Transfers Sit to/from Stand  Sit to Stand Supervision  General transfer comment supervision for safety  OT - End of Session  Equipment Utilized During Treatment Rolling walker  Activity Tolerance Patient tolerated treatment well  Patient left in bed;with call bell/phone within reach  Nurse Communication Mobility status  OT Assessment/Plan  OT Plan Frequency remains appropriate;Discharge plan needs to be updated  OT Visit Diagnosis Other abnormalities of gait and mobility (R26.89);Muscle weakness (generalized) (M62.81);Pain  Pain - part of body  (back)  OT Frequency (ACUTE ONLY) Min 1X/week  Follow Up Recommendations No OT follow up;Supervision - Intermittent  OT Equipment  None recommended by OT  AM-PAC OT "6 Clicks" Daily Activity Outcome Measure (Version 2)  Help  from another person eating meals? 4  Help from another person taking care of personal grooming? 4  Help from another person toileting, which includes using toliet, bedpan, or urinal? 3  Help from another person bathing (including washing, rinsing, drying)? 3  Help from another person to put on and taking off regular upper body clothing? 4  Help from another person to put on and taking off regular lower body clothing? 3  6 Click Score 21  OT Goal Progression  Progress towards OT goals Progressing toward goals  Acute Rehab OT Goals  Patient Stated Goal walk and go home  OT Goal Formulation With patient  ADL Goals  Pt Will Perform Tub/Shower Transfer Shower transfer;with modified independence;3 in 1;grab bars  Additional ADL Goal #1 Pt will demonstrate ability to stand during ADL/IADL tasks with 0-2 hand support for 5 minutes without rest breaks required.  OT Time Calculation  OT Start Time (ACUTE ONLY) 1105  OT Stop Time (ACUTE ONLY) 1138  OT Time Calculation (min) 33 min  OT General Charges  $OT Visit 1 Visit  OT Treatments  $Self Care/Home Management  23-37 mins    Barry Brunner, OT Acute Rehabilitation Services Pager [phone] Office [phone]

## 2020-10-30 NOTE — Progress Notes (Signed)
Physical Therapy Treatment Patient Details Name: Derek Woods MRN: [id number] DOB: Oct 02, 1990 Today's Date: 10/30/2020    History of Present Illness [name] 30 yo male who presented on 10/07/20 with shortness of breath and found to have large R empyema.  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  Medical hx includes daily heroin IVDA (previous suboxone use), asthma, morbid obesity, OSA, and smokes.    PT Comments    Supervision bed mobility and transfers. Min guard assist ambulation 200' with RW. Cues for back precautions. Pt returned to bed at end of session. Goals reviewed and updated.    Follow Up Recommendations  Home health PT;Supervision for mobility/OOB     Equipment Recommendations  Rolling walker with 5" wheels;3in1 (PT)    Recommendations for Other Services       Precautions / Restrictions Precautions Precautions: Back Precaution Comments: reviewed back precautions    Mobility  Bed Mobility Overal bed mobility: Needs Assistance   Rolling: Supervision Sidelying to sit: Supervision     Sit to sidelying: Supervision General bed mobility comments: supervision/cues to reinforce back precautions    Transfers Overall transfer level: Needs assistance Equipment used: Rolling walker (2 wheeled) Transfers: Sit to/from Stand Sit to Stand: Supervision         General transfer comment: supervision for safety  Ambulation/Gait Ambulation/Gait assistance: Min guard Gait Distance (Feet): 200 Feet Assistive device: Rolling walker (2 wheeled) Gait Pattern/deviations: Step-through pattern;Wide base of support;Decreased stride length Gait velocity: decreased Gait velocity interpretation: 1.31 - 2.62 ft/sec, indicative of limited community ambulator General Gait Details: min guard for  safety, cues for posture and proximity to [employer] Mobility    Modified Rankin (Stroke Patients Only)       Balance Overall balance assessment: Needs assistance Sitting-balance support: Feet supported;No upper extremity supported Sitting balance-Leahy Scale: Normal     Standing balance support: Bilateral upper extremity supported;No upper extremity supported;During functional activity Standing balance-Leahy Scale: Fair Standing balance comment: static stand without UE support, RW for hallway amb                            Cognition Arousal/Alertness: Awake/alert Behavior During Therapy: WFL for tasks assessed/performed Overall Cognitive Status: Within Functional Limits for tasks assessed                                        Exercises      General Comments        Pertinent Vitals/Pain Pain Assessment: Faces Faces Pain Scale: Hurts little more Pain Location: back, R flank Pain Descriptors / Indicators: Grimacing;Guarding Pain Intervention(s): Monitored during session;Repositioned    Home Living                      Prior Function            PT Goals (current goals can now be found in the care plan section) Acute Rehab PT Goals Patient Stated Goal: home PT Goal Formulation: With patient Time For Goal Achievement: 11/13/20 Potential to Achieve Goals: Good Progress towards PT goals: Progressing toward goals  Frequency    Min 3X/week      PT Plan Current plan remains appropriate    Co-evaluation              AM-PAC PT "6 Clicks" Mobility   Outcome Measure  Help needed turning from your back to your side while in a flat bed without using bedrails?: A Little Help needed moving from lying on your back to sitting on the side of a flat bed without using bedrails?: A Little Help needed moving to and from a bed to a chair (including a wheelchair)?: A Little Help needed standing  up from a chair using your arms (e.g., wheelchair or bedside chair)?: A Little Help needed to walk in hospital room?: A Little Help needed climbing 3-5 steps with a railing? : A Lot 6 Click Score: 17    End of Session   Activity Tolerance: Patient tolerated treatment well Patient left: in bed;with call bell/phone within reach Nurse Communication: Mobility status PT Visit Diagnosis: Other abnormalities of gait and mobility (R26.89);Muscle weakness (generalized) (M62.81);Pain     Time: 1241-1255 PT Time Calculation (min) (ACUTE ONLY): 14 min  Charges:  $Gait Training: 8-22 mins                     Derek Woods, [name]  Office # [phone] Pager [phone]    Derek Woods 10/30/2020, 1:10 PM

## 2020-10-30 NOTE — Progress Notes (Signed)
Pharmacy Antibiotic Note  Derek Woods is a 30 y.o. male admitted on 10/07/2020 with loculated pleural effusion with empyema now s/p VATS and thoracotomy. Also with T11-T12 discitis and osteomyelitis, epidural abscess s/p thoracic laminectomy. Pharmacy has been consulted for vancomycin dosing.  Vancomycin peak of 30 and trough of 10, calculated AUC 462.8 which is  therapeutic   Plan: Continue vancomycin to 1000mg  IV q12h Monitor renal function, levels as indicated   Height: 5\' 6"  (167.6 cm) Weight: (!) 146.7 kg (323 lb 6.4 oz) IBW/kg (Calculated) : 63.8  Temp (24hrs), Avg:98.3 F (36.8 C), Min:98.1 F (36.7 C), Max:98.5 F (36.9 C)  Recent Labs  Lab 10/24/20 0415 10/24/20 0923 10/27/20 0328 10/29/20 0415 10/30/20 0500 10/30/20 1200 10/30/20 2130  WBC 4.9  --  2.5* 1.9*  --   --   --   CREATININE 0.87  --  0.78  --  0.82  --   --   VANCOTROUGH  --  12*  --   --   --   --  10*  VANCOPEAK  --   --   --   --   --  30  --     Estimated Creatinine Clearance: 179.1 mL/min (by C-G formula based on SCr of 0.82 mg/dL).    Allergies  Allergen Reactions  . Augmentin [Amoxicillin-Pot Clavulanate] Other (See Comments)    Pt does not recall, childhood  . Ceclor [Cefaclor] Other (See Comments)    Pt does not recall, childhood  . Sulfa Antibiotics Hives    Antimicrobials this admission: 4/24 vancomycin >> 4/24 cefepime >> 4/28  Dose adjustments this admission: 4/28 Vanc random 30 - hold vancomycin 4/29 Vanc random 11 - start 2000mg  IV q24h 5/4 AUC 575 on 2000mg  IV q24 > 1000mg  IV q12h (eAUC 575)  5/28, PharmD, [facility] Clinical Pharmacist Please see AMION for all Pharmacists' Contact Phone Numbers 10/30/2020, 10:54 PM

## 2020-10-30 NOTE — TOC Progression Note (Signed)
Transition of Care [facility]) - Progression Note    Patient Details  Name: Derek Woods MRN: [id number] Date of Birth: 1991/05/14  Transition of Care [facility]) CM/SW Contact  Curlene Labrum, RN Phone Number: 10/30/2020, 2:06 PM  Clinical Narrative:    Case management met with the patient at the bedside in regards to transitions of care and discharge planning for 11/27/2020 when IV antibiotics are complete.  The patient plans to discharge home with his mother in [place] at [address], [place], [place] [address].  The patient's mother is disabled and does not have a vehicle nor able to drive - so transportation will be an issue for the patient after discharge per patient.  The patient currently has a PICC line and will receive IV antibiotics through 11/27/2020.  I called and set patient up with [facility] follow up with pharmacy for discharge needs.  The patient has history of IVDU therapy at [facility] in the past per patient.  Orders placed for dme to include bariatric 3:1 an bariatric RW.  I will follow up with [employer] for delivery of charity dme to hospital room closer to discharge.  The patient will need home health PT/OT versus outpatient PT/OT at [facility] if unable to establish an accepting home health provider.  Will set up with charity [facility] provider closer to discharge.  CM and MSW will continue to follow for discharge needs for home.   Expected Discharge Plan: [place] Barriers to Discharge: Continued Medical Work up,Inadequate or no insurance,Active Substance Use with PICC Line  Expected Discharge Plan and Services Expected Discharge Plan: [place] In-house Referral: Clinical Social [facility] / [facility],Financial Counselor Discharge Planning Services: CM Consult,Indigent Health Clinic,Medication Assistance,Follow-up appt scheduled Post Acute Care Choice: [place] Living arrangements for the past 2 months:  Apartment                 DME Arranged: 3-N-1,Walker rolling (Patient will need bariatric RW and 3:1 closer to discharge home from the hospital.) DME Agency: [employer]                   Social Determinants of Health ([place]) Interventions    Readmission Risk Interventions Readmission Risk Prevention Plan 10/30/2020  Transportation Screening Complete  PCP or Specialist Appt within 5-7 Days Complete  Home Care Screening Complete  Medication Review (RN CM) Complete

## 2020-10-30 NOTE — Progress Notes (Signed)
[facility] PROGRESS NOTE  Derek Woods JSH:[id number] DOB: 1990/07/12 DOA: 10/07/2020 PCP: Pcp, No  Status: Remains inpatient appropriate because:Unsafe d/c plan, IV treatments appropriate due to intensity of illness or inability to take PO and Inpatient level of care appropriate due to severity of illness   Dispo: The patient is from: Home              Anticipated d/c is to: Home to live with mother who is disabled secondary to rheumatoid arthritis.  Post discharge transportation will be an issue.  This patient lives in the [place] area but will need to come to [facility] for the Suboxone clinic as well as for ID clinic and given this we will also arrange for at least 1 PCP appointment after discharge in the [place] area.              Patient currently is not medically stable to d/c.   Difficult to place patient Yes   Level of care: Med-Surg  Code Status: Full Family Communication: Patient DVT prophylaxis: Lovenox Vaccination status: Unknown   HPI: 30 year old male with past medical history of daily IV drug abuse with heroin and has used Suboxone from the streets.  He also has asthma, morbid obesity with a BMI of 52, daily smoker, suspected sleep apnea.  He presented to [facility] on 4/24 with back pain, right flank pain and shortness of breath x1 week.  CT of the chest consistent with large right empyema and he was admitted by the hospitalist team.  Subsequently taken to the OR on 4/26 for VATS and drainage of right empyema but due to severity of disease thoracotomy was completed instead.  He was a difficult intubation prior to the procedure and was unable to be weaned from the ventilator postoperatively.  There were also concerns of possible difficulty with appropriate oxygenation during the procedure.  He was subsequently admitted to the ICU under the care of PCCM.  Eventually extubated on 4/27 and transferred out of ICU on 4/28.  Posterior chest tube removed 4/29.   Intraoperative cultures grew MRSA.  Antibiotics have been de-escalated to vancomycin.  ID consulted on 4/30.  MRI of thoracic spine was done and demonstrated T11/T12 discitis and possible abscess.  Neurosurgery was consulted and he subsequently underwent decompressive thoracic laminectomy on 5/2.  Incidental finding of hep C positive antibodies.   Subjective: Awake.  No complaints.  Requesting his pain medication.  Objective: Vitals:   10/30/20 0017 10/30/20 0411  BP: 132/78 134/80  Pulse: 90 86  Resp: 17 20  Temp: 98.4 F (36.9 C) 98.2 F (36.8 C)  SpO2: 97% 98%    Intake/Output Summary (Last 24 hours) at 10/30/2020 0746 Last data filed at 10/30/2020 0032 Gross per 24 hour  Intake 203 ml  Output 950 ml  Net -747 ml   Filed Weights   10/07/20 1006  Weight: (!) 146.7 kg    Exam:  Constitutional: Pleasant, no acute distress, appears to be comfortable while in bed Respiratory: Lungs are clear anteriorly.  Normal respiratory effort supine in bed.  Room air Cardiovascular: S1-S2, no peripheral edema.  Regular pulse.  Chane warm dry and somewhat pale Abdomen: LBM 5/9, tender nondistended with normoactive bowel sounds.  Eating well. Neurologic: CN 2-12 grossly intact. Sensation intact, Strength 5/5 x all 4 extremities.  Ambulates with rolling walker. Psychiatric: Alert and oriented x3.  Appropriate affect   Assessment/Plan: Acute problems: Acute respiratory failure with hypercarbia and hypoxia (Resolved)/Right MRSA empyema s/p right thoracotomy  on 10/09/2020 Briefly intubated in the immediate postop period.  Continue vancomycin -ID recommends 6 weeks from 5/2 with last dose due on 11/27/2020.  If he changes his mind/decides to leave AMA, can consider alternative options like oritavancin.   ID to be consulted again when he is approaching end date of antibiotics for reevaluation for transition to oral antibiotics +/- reimaging.    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   Drains have been removed and follow-up lumbar MRI w/o evidence of infection.   Neurosurgery signed off 5/5 -recommend outpatient follow-up in 2 weeks  Continue Suboxone,Tylenol, naproxen with p.o. Toradol for breakthrough pain, topical lidocaine, Robaxin, bedtime Lyrica  Essential hypertension Norvasc initiated this hospitalization.   Polysubstance abuse/IVDU Continue Suboxone.  Avoid other opioids. Plan is to continue after discharge and follow-up at internal medicine Suboxone clinic/Dr. Oswaldo Done Patient will need to call the clinic closer to discharge to arrange for intake interview  Physical deconditioning Improving and able to ambulate at times for short distances without rolling walker-HHPT  Morbid obesity Estimated body mass index is 52.2 kg/m as calculated from the following:   Height as of this encounter: 5\' 6"  (1.676 m).   Weight as of this encounter: 146.7 kg.  Other problems: Positive hepatitis C antibody No known previous history of hepatitis C infection.  Antibodies positive.  ID will follow up HCV RNA and have made a follow-up appointment at [facility] for HCV treatment on 6/21 at 8:45 AM.  Anxiety Continue as needed hydroxyzine.  Normocytic anemia/Iron deficiency anemia Hemoglobin is stable over 9.  Monitor CBC periodically. 5/16 iron 41 -begin PO iron  Tobacco dependence Smoking cessation counseling provided.   Data Reviewed: Basic Metabolic Panel: Recent Labs  Lab 10/24/20 0415 10/27/20 0328 10/30/20 0500  NA 135 132* 134*  K 4.6 4.5 4.5  CL 103 100 103  CO2 25 23 26   GLUCOSE 126* 101* 138*  BUN 18 20 19   CREATININE 0.87 0.78 0.82  CALCIUM 9.3 9.0 8.7*   Liver Function Tests: Recent Labs  Lab 10/24/20 0415  AST 26  ALT 20  ALKPHOS 108  BILITOT 0.6  PROT 7.2   ALBUMIN 2.8*     CBC: Recent Labs  Lab 10/24/20 0415 10/27/20 0328 10/29/20 0415  WBC 4.9 2.5* 1.9*  NEUTROABS 2.7  --  0.8*  HGB 10.3* 10.5* 8.9*  HCT 32.4* 32.5* 28.1*  MCV 93.9 92.1 93.7  PLT 320 240 177     Scheduled Meds: . acetaminophen  1,000 mg Oral TID  . amLODipine  5 mg Oral Daily  . buprenorphine-naloxone  2 tablet Sublingual BID  . Chlorhexidine Gluconate Cloth  6 each Topical Daily  . docusate sodium  100 mg Oral BID  . enoxaparin (LOVENOX) injection  70 mg Subcutaneous Q24H  . ferrous sulfate  325 mg Oral BID WC  . lidocaine  1 patch Transdermal Q24H  . mouth rinse  15 mL Mouth Rinse BID  . naproxen  250 mg Oral BID WC  . nicotine  14 mg Transdermal Daily  . pantoprazole  40 mg Oral QHS  . polyethylene glycol  17 g Oral Daily  . pregabalin  50 mg Oral QHS  . senna-docusate  2 tablet Oral QHS  . sodium chloride flush  10-40 mL Intracatheter Q12H  . sodium chloride flush  3 mL Intravenous Q12H  . sodium  chloride flush  3 mL Intravenous Q12H   Continuous Infusions: . sodium chloride Stopped (10/16/20 1455)  . vancomycin Stopped (10/30/20 0032)    Principal Problem:   Empyema lung (HCC) Active Problems:   Polysubstance dependence including opioid type drug with complication, episodic abuse (HCC)   Morbid obesity with BMI of 50.0-59.9, adult (HCC)   Tobacco dependence   MRSA infection   Abscess in epidural space of thoracic spine   Consultants:  CT surgery  ID  Neurosurgery   Procedures:  Chest tube placement, thoracotomy and intubation  Right upper arm PICC line.  S/p decompressive thoracic laminectomy T11-12 on 10/15/2020   Antibiotics:  Vancomycin 4/24 >>  Maxipime 4/24 through 4/28  Ancef 5/2 through 5/3   Time spent: 35    Junious Silk ANP  [facility] 7 am - 330 pm/M-F for direct patient care and secure chat Please refer to Amion for contact info 23  days

## 2020-10-31 NOTE — Progress Notes (Signed)
[facility] PROGRESS NOTE  Derek Woods ZWC:[id number] DOB: October 10, 1990 DOA: 10/07/2020 PCP: Pcp, No  Status: Remains inpatient appropriate because:Unsafe d/c plan, IV treatments appropriate due to intensity of illness or inability to take PO and Inpatient level of care appropriate due to severity of illness   Dispo: The patient is from: Home              Anticipated d/c is to: Home to live with mother who is disabled secondary to rheumatoid arthritis.  Post discharge transportation will be an issue.  This patient lives in the [place] area but will need to come to [facility] for the Suboxone clinic as well as for ID clinic and given this we will also arrange for at least 1 PCP appointment after discharge in the [place] area.              Patient currently is not medically stable to d/c.   Difficult to place patient Yes   Level of care: Med-Surg  Code Status: Full Family Communication: Patient DVT prophylaxis: Lovenox Vaccination status: Unknown   HPI: 30 year old male with past medical history of daily IV drug abuse with heroin and has used Suboxone from the streets.  He also has asthma, morbid obesity with a BMI of 52, daily smoker, suspected sleep apnea.  He presented to [facility] on 4/24 with back pain, right flank pain and shortness of breath x1 week.  CT of the chest consistent with large right empyema and he was admitted by the hospitalist team.  Subsequently taken to the OR on 4/26 for VATS and drainage of right empyema but due to severity of disease thoracotomy was completed instead.  He was a difficult intubation prior to the procedure and was unable to be weaned from the ventilator postoperatively.  There were also concerns of possible difficulty with appropriate oxygenation during the procedure.  He was subsequently admitted to the ICU under the care of PCCM.  Eventually extubated on 4/27 and transferred out of ICU on 4/28.  Posterior chest tube removed 4/29.   Intraoperative cultures grew MRSA.  Antibiotics have been de-escalated to vancomycin.  ID consulted on 4/30.  MRI of thoracic spine was done and demonstrated T11/T12 discitis and possible abscess.  Neurosurgery was consulted and he subsequently underwent decompressive thoracic laminectomy on 5/2.  Incidental finding of hep C positive antibodies.   Subjective: Awake and alert.  Significant other at bedside.  They are requesting patient be allowed to wash his hair in the bed today.  Pain adequately controlled.  Objective: Vitals:   10/30/20 2343 10/31/20 0340  BP: (!) 159/80 126/76  Pulse: 92 85  Resp: 18 18  Temp: 99.3 F (37.4 C) 98.8 F (37.1 C)  SpO2: 97% 97%    Intake/Output Summary (Last 24 hours) at 10/31/2020 2353 Last data filed at 10/31/2020 0001 Gross per 24 hour  Intake 1367 ml  Output 1325 ml  Net 42 ml   Filed Weights   10/07/20 1006  Weight: (!) 146.7 kg    Exam:  Constitutional: Awake and alert and in no acute distress Respiratory: Bilateral lung sounds are clear to auscultation and he remained stable on room air with no increased work of breathing at rest Cardiovascular: Normal heart sounds, regular pulse, no peripheral edema Abdomen: LBM 5/17, soft nontender and tolerating regular diet  Neurologic: CN 2-12 grossly intact. Sensation intact, Strength 5/5 x all 4 extremities.  Ambulates with rolling walker. Psychiatric: Alert and oriented x3 with pleasant affect  Assessment/Plan: Acute problems: Acute respiratory failure with hypercarbia and hypoxia (Resolved)/Right MRSA empyema s/p right thoracotomy on 10/09/2020 Briefly intubated in the immediate postop period.  Continue vancomycin -ID recommends 6 weeks from 5/2 with last dose due on 11/27/2020.  If he changes his mind/decides to leave AMA, can consider alternative options like oritavancin.   ID to be consulted again when he is approaching end date of antibiotics for reevaluation for transition to oral  antibiotics +/- reimaging.    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   Follow-up lumbar MRI w/o evidence of infection.   Neurosurgery signed off 5/5 -recommend outpatient follow-up in 2 weeks  Continue Suboxone,Tylenol, naproxen with p.o. Toradol for breakthrough pain, topical lidocaine, Robaxin, bedtime Lyrica  Essential hypertension Norvasc initiated this hospitalization.   Polysubstance abuse/IVDU Continue Suboxone.  Avoid other opioids. Plan is to continue after discharge and follow-up at internal medicine Suboxone clinic/Dr. Oswaldo Done  Physical deconditioning Improving and able to ambulate at times for short distances without rolling walker-HHPT  Morbid obesity Estimated body mass index is 52.2 kg/m as calculated from the following:   Height as of this encounter: 5\' 6"  (1.676 m).   Weight as of this encounter: 146.7 kg.  Other problems: Positive hepatitis C antibody No known previous history of hepatitis C infection.  Antibodies positive.  ID will follow up HCV RNA and have made a follow-up appointment at [facility] for HCV treatment on 6/21 at 8:45 AM.  Anxiety Continue as needed hydroxyzine.  Normocytic anemia/Iron deficiency anemia Hemoglobin is stable over 9.   Continue PO iron  Tobacco dependence Smoking cessation counseling provided.   Data Reviewed: Basic Metabolic Panel: Recent Labs  Lab 10/27/20 0328 10/30/20 0500  NA 132* 134*  K 4.5 4.5  CL 100 103  CO2 23 26  GLUCOSE 101* 138*  BUN 20 19  CREATININE 0.78 0.82  CALCIUM 9.0 8.7*   Liver Function Tests: No results for input(s): AST, ALT, ALKPHOS, BILITOT, PROT, ALBUMIN in the last 168 hours.   CBC: Recent Labs  Lab 10/27/20 0328 10/29/20 0415  WBC 2.5* 1.9*  NEUTROABS  --  0.8*  HGB 10.5* 8.9*  HCT 32.5*  28.1*  MCV 92.1 93.7  PLT 240 177     Scheduled Meds: . acetaminophen  1,000 mg Oral TID  . amLODipine  5 mg Oral Daily  . buprenorphine-naloxone  2 tablet Sublingual BID  . Chlorhexidine Gluconate Cloth  6 each Topical Daily  . docusate sodium  100 mg Oral BID  . enoxaparin (LOVENOX) injection  70 mg Subcutaneous Q24H  . ferrous sulfate  325 mg Oral BID WC  . lidocaine  1 patch Transdermal Q24H  . mouth rinse  15 mL Mouth Rinse BID  . naproxen  250 mg Oral BID WC  . nicotine  14 mg Transdermal Daily  . pantoprazole  40 mg Oral QHS  . polyethylene glycol  17 g Oral Daily  . pregabalin  50 mg Oral QHS  . senna-docusate  2 tablet Oral QHS  . sodium chloride flush  10-40 mL Intracatheter Q12H  . sodium chloride flush  3 mL Intravenous Q12H  . sodium chloride flush  3 mL Intravenous Q12H   Continuous Infusions: . sodium chloride Stopped (10/16/20 1455)  . vancomycin Stopped (10/31/20 0001)    Principal Problem:   Empyema lung (HCC) Active Problems:   Polysubstance  dependence including opioid type drug with complication, episodic abuse (HCC)   Morbid obesity with BMI of 50.0-59.9, adult (HCC)   Tobacco dependence   MRSA infection   Abscess in epidural space of thoracic spine   Consultants:  CT surgery  ID  Neurosurgery   Procedures:  Chest tube placement, thoracotomy and intubation  Right upper arm PICC line.  S/p decompressive thoracic laminectomy T11-12 on 10/15/2020   Antibiotics:  Vancomycin 4/24 >>  Maxipime 4/24 through 4/28  Ancef 5/2 through 5/3   Time spent: 35    Junious Silk ANP  [facility] 7 am - 330 pm/M-F for direct patient care and secure chat Please refer to Amion for contact info 24  days

## 2020-11-01 NOTE — Progress Notes (Signed)
Patient refused to let PICC dressing change, said not to bother, will let do later in a day, Charge RN, Michele Mcalpine was notified.

## 2020-11-01 NOTE — Progress Notes (Signed)
[facility] PROGRESS NOTE  Derek Woods JOI:[id number] DOB: 1990/12/03 DOA: 10/07/2020 PCP: Pcp, No  Status: Remains inpatient appropriate because:Unsafe d/c plan, IV treatments appropriate due to intensity of illness or inability to take PO and Inpatient level of care appropriate due to severity of illness   Dispo: The patient is from: Home              Anticipated d/c is to: Home to live with mother who is disabled secondary to rheumatoid arthritis.  Post discharge transportation will be an issue.  This patient lives in the [place] area but will need to come to [facility] for the Suboxone clinic as well as for ID clinic and given this we will also arrange for at least 1 PCP appointment after discharge in the [place] area.              Patient currently is not medically stable to d/c.   Difficult to place patient Yes   Level of care: Med-Surg  Code Status: Full Family Communication: Patient DVT prophylaxis: Lovenox Vaccination status: Unknown   HPI: 30 year old male with past medical history of daily IV drug abuse with heroin and has used Suboxone from the streets.  He also has asthma, morbid obesity with a BMI of 52, daily smoker, suspected sleep apnea.  He presented to [facility] on 4/24 with back pain, right flank pain and shortness of breath x1 week.  CT of the chest consistent with large right empyema and he was admitted by the hospitalist team.  Subsequently taken to the OR on 4/26 for VATS and drainage of right empyema but due to severity of disease thoracotomy was completed instead.  He was a difficult intubation prior to the procedure and was unable to be weaned from the ventilator postoperatively.  There were also concerns of possible difficulty with appropriate oxygenation during the procedure.  He was subsequently admitted to the ICU under the care of PCCM.  Eventually extubated on 4/27 and transferred out of ICU on 4/28.  Posterior chest tube removed 4/29.   Intraoperative cultures grew MRSA.  Antibiotics have been de-escalated to vancomycin.  ID consulted on 4/30.  MRI of thoracic spine was done and demonstrated T11/T12 discitis and possible abscess.  Neurosurgery was consulted and he subsequently underwent decompressive thoracic laminectomy on 5/2.  Incidental finding of hep C positive antibodies.   Subjective: Alert and awake.  Reports pain is adequately controlled.  Request that we change laxatives to as needed and change lidocaine patch to as needed.  Objective: Vitals:   11/01/20 0429 11/01/20 0700  BP: 106/76 124/80  Pulse: 80   Resp: 18 18  Temp: 98.4 F (36.9 C) 98.7 F (37.1 C)  SpO2: 99% 96%    Intake/Output Summary (Last 24 hours) at 11/01/2020 4709 Last data filed at 11/01/2020 0400 Gross per 24 hour  Intake 246 ml  Output 2700 ml  Net -2454 ml   Filed Weights   10/07/20 1006  Weight: (!) 146.7 kg    Exam:  Constitutional: Alert, calm, comfortable Respiratory: Anterior lung sounds are clear to auscultation, no increased work of breathing, room air Cardiovascular: S1-S2, regular pulse, no peripheral edema, skin warm and dry Abdomen: LBM 5/17, soft nontender nondistended with normoactive bowel sound Neurologic: CN 2-12 grossly intact. Sensation intact, Strength 5/5 x all 4 extremities.  Ambulates with rolling walker. Psychiatric: And oriented x3, pleasant affect   Assessment/Plan: Acute problems: Acute respiratory failure with hypercarbia and hypoxia (Resolved)/Right MRSA empyema s/p right thoracotomy  on 10/09/2020 Continue vancomycin -ID recommends 6 weeks from 5/2 with last dose due on 11/27/2020.  If he changes his mind/decides to leave AMA, can consider alternative options like oritavancin.   ID to be consulted again when he is approaching end date of antibiotics for reevaluation for transition to oral antibiotics +/- reimaging.    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   Follow-up lumbar MRI w/o evidence of infection.   Neurosurgery recommends outpatient follow-up in 2 weeks  Continue Suboxone,Tylenol, naproxen with p.o. Toradol for breakthrough pain, topical lidocaine, Robaxin, bedtime Lyrica  Essential hypertension Norvasc initiated this hospitalization.   Polysubstance abuse/IVDU Continue Suboxone. Plan is to continue after discharge and follow-up at internal medicine Suboxone clinic/Dr. Oswaldo Done  Physical deconditioning Improving and able to ambulate at times for short distances without rolling walker-HHPT  Morbid obesity Estimated body mass index is 52.2 kg/m as calculated from the following:   Height as of this encounter: 5\' 6"  (1.676 m).   Weight as of this encounter: 146.7 kg.  Other problems: Positive hepatitis C antibody No known previous history of hepatitis C infection.  Antibodies positive.  ID will follow up HCV RNA and have made a follow-up appointment at [facility] for HCV treatment on 6/21 at 8:45 AM.  Anxiety Continue as needed hydroxyzine.  Normocytic anemia/Iron deficiency anemia Hemoglobin is stable over 9.   Continue PO iron  Tobacco dependence Smoking cessation counseling provided.   Data Reviewed: Basic Metabolic Panel: Recent Labs  Lab 10/27/20 0328 10/30/20 0500  NA 132* 134*  K 4.5 4.5  CL 100 103  CO2 23 26  GLUCOSE 101* 138*  BUN 20 19  CREATININE 0.78 0.82  CALCIUM 9.0 8.7*   Liver Function Tests: No results for input(s): AST, ALT, ALKPHOS, BILITOT, PROT, ALBUMIN in the last 168 hours.   CBC: Recent Labs  Lab 10/27/20 0328 10/29/20 0415  WBC 2.5* 1.9*  NEUTROABS  --  0.8*  HGB 10.5* 8.9*  HCT 32.5* 28.1*  MCV 92.1 93.7  PLT 240 177     Scheduled Meds: . acetaminophen  1,000 mg Oral TID  . amLODipine  5 mg Oral Daily  .  buprenorphine-naloxone  2 tablet Sublingual BID  . Chlorhexidine Gluconate Cloth  6 each Topical Daily  . docusate sodium  100 mg Oral BID  . enoxaparin (LOVENOX) injection  70 mg Subcutaneous Q24H  . ferrous sulfate  325 mg Oral BID WC  . lidocaine  1 patch Transdermal Q24H  . mouth rinse  15 mL Mouth Rinse BID  . naproxen  250 mg Oral BID WC  . nicotine  14 mg Transdermal Daily  . pantoprazole  40 mg Oral QHS  . polyethylene glycol  17 g Oral Daily  . pregabalin  50 mg Oral QHS  . senna-docusate  2 tablet Oral QHS  . sodium chloride flush  10-40 mL Intracatheter Q12H  . sodium chloride flush  3 mL Intravenous Q12H  . sodium chloride flush  3 mL Intravenous Q12H   Continuous Infusions: . sodium chloride Stopped (10/16/20 1455)  . vancomycin 1,000 mg (10/31/20 2134)    Principal Problem:   Empyema lung (HCC) Active Problems:   Polysubstance dependence including opioid type drug with complication, episodic abuse (HCC)   Morbid obesity with BMI of 50.0-59.9, adult (HCC)   Tobacco dependence   MRSA infection  Abscess in epidural space of thoracic spine   Consultants:  CT surgery  ID  Neurosurgery   Procedures:  Chest tube placement, thoracotomy and intubation  Right upper arm PICC line.  S/p decompressive thoracic laminectomy T11-12 on 10/15/2020   Antibiotics:  Vancomycin 4/24 >>  Maxipime 4/24 through 4/28  Ancef 5/2 through 5/3   Time spent: 35    Junious Silk ANP  [facility] 7 am - 330 pm/M-F for direct patient care and secure chat Please refer to Amion for contact info 25  days

## 2020-11-01 NOTE — Plan of Care (Signed)

## 2020-11-02 LAB — BASIC METABOLIC PANEL
Anion gap: 5 (ref 5–15)
BUN: 16 mg/dL (ref 6–20)
CO2: 28 mmol/L (ref 22–32)
Calcium: 8.7 mg/dL — ABNORMAL LOW (ref 8.9–10.3)
Chloride: 103 mmol/L (ref 98–111)
Creatinine, Ser: 0.75 mg/dL (ref 0.61–1.24)
GFR, Estimated: >60 mL/min (ref >60)
Glucose, Bld: 98 mg/dL (ref 70–99)
Potassium: 4.5 mmol/L (ref 3.5–5.1)
Sodium: 136 mmol/L (ref 135–145)

## 2020-11-02 NOTE — Progress Notes (Signed)
[facility] PROGRESS NOTE  Derek Woods ZOX:[id number] DOB: 07/17/1990 DOA: 10/07/2020 PCP: Pcp, No  Status: Remains inpatient appropriate because:Unsafe d/c plan, IV treatments appropriate due to intensity of illness or inability to take PO and Inpatient level of care appropriate due to severity of illness   Dispo: The patient is from: Home              Anticipated d/c is to: Home to live with mother who is disabled secondary to rheumatoid arthritis.  Post discharge transportation will be an issue.  This patient lives in the [place] area but will need to come to [facility] for the Suboxone clinic as well as for ID clinic and given this we will also arrange for at least 1 PCP appointment after discharge in the [place] area.              Patient currently is not medically stable to d/c.   Difficult to place patient Yes   Level of care: Med-Surg  Code Status: Full Family Communication: Patient DVT prophylaxis: Lovenox Vaccination status: Unknown   HPI: 30 year old male with past medical history of daily IV drug abuse with heroin and has used Suboxone from the streets.  He also has asthma, morbid obesity with a BMI of 52, daily smoker, suspected sleep apnea.  He presented to [facility] on 4/24 with back pain, right flank pain and shortness of breath x1 week.  CT of the chest consistent with large right empyema and he was admitted by the hospitalist team.  Subsequently taken to the OR on 4/26 for VATS and drainage of right empyema but due to severity of disease thoracotomy was completed instead.  He was a difficult intubation prior to the procedure and was unable to be weaned from the ventilator postoperatively.  There were also concerns of possible difficulty with appropriate oxygenation during the procedure.  He was subsequently admitted to the ICU under the care of PCCM.  Eventually extubated on 4/27 and transferred out of ICU on 4/28.  Posterior chest tube removed 4/29.   Intraoperative cultures grew MRSA.  Antibiotics have been de-escalated to vancomycin.  ID consulted on 4/30.  MRI of thoracic spine was done and demonstrated T11/T12 discitis and possible abscess.  Neurosurgery was consulted and he subsequently underwent decompressive thoracic laminectomy on 5/2.  Incidental finding of hep C positive antibodies.   Subjective: Awake.  No specific complaints.  Very bored and appeared to be sad.  Would not affect improved significantly when discussing comic book related movies.  Objective: Vitals:   11/01/20 2259 11/02/20 0344  BP: 128/74 125/84  Pulse: 92 88  Resp: 18 18  Temp: 98.5 F (36.9 C) 98 F (36.7 C)  SpO2: 98% 97%    Intake/Output Summary (Last 24 hours) at 11/02/2020 0755 Last data filed at 11/01/2020 2246 Gross per 24 hour  Intake 780 ml  Output 1330 ml  Net -550 ml   Filed Weights   10/07/20 1006  Weight: (!) 146.7 kg    Exam:  Constitutional: Alert, calm, no acute distress Respiratory: Anterior lung sounds are clear, stable on room air without any increased work of breathing at rest Cardiovascular: S1-S2, regular pulse, normotensive Abdomen: LBM 5/18, soft nontender nondistended.  Adequate oral intake Neurologic: CN 2-12 grossly intact. Sensation intact, Strength 5/5 x all 4 extremities.  Ambulates with rolling walker. Psychiatric: Alert and oriented x3.  Pleasant affect   Assessment/Plan: Acute problems: Acute respiratory failure with hypercarbia and hypoxia (Resolved)/Right MRSA empyema s/p right  thoracotomy on 10/09/2020 Continue vancomycin -ID recommends 6 weeks from 5/2 with last dose due on 11/27/2020.  ID to be consulted again when he is approaching end date of antibiotics for reevaluation for transition to oral antibiotics +/- reimaging.    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   Follow-up lumbar MRI w/o evidence of infection.   Neurosurgery recommends outpatient follow-up in 2 weeks  Continue Suboxone,Tylenol, naproxen with p.o. Toradol for breakthrough pain, topical lidocaine, Robaxin, bedtime Lyrica  Essential hypertension Norvasc initiated this hospitalization.   Polysubstance abuse/IVDU Continue Suboxone. Plan is to continue after discharge and follow-up at internal medicine Suboxone clinic/Dr. Oswaldo Done  Physical deconditioning Improving and able to ambulate at times for short distances without rolling walker-HHPT  Morbid obesity Estimated body mass index is 52.2 kg/m as calculated from the following:   Height as of this encounter: 5\' 6"  (1.676 m).   Weight as of this encounter: 146.7 kg.  Other problems: Positive hepatitis C antibody No known previous history of hepatitis C infection.  Antibodies positive.  ID will follow up HCV RNA and have made a follow-up appointment at [facility] for HCV treatment on 6/21 at 8:45 AM.  Anxiety Continue as needed hydroxyzine.  Normocytic anemia/Iron deficiency anemia Hemoglobin is stable over 9.   Continue PO iron  Tobacco dependence Smoking cessation counseling provided.   Data Reviewed: Basic Metabolic Panel: Recent Labs  Lab 10/27/20 0328 10/30/20 0500 11/02/20 0500  NA 132* 134* 136  K 4.5 4.5 4.5  CL 100 103 103  CO2 23 26 28   GLUCOSE 101* 138* 98  BUN 20 19 16   CREATININE 0.78 0.82 0.75  CALCIUM 9.0 8.7* 8.7*   Liver Function Tests: No results for input(s): AST, ALT, ALKPHOS, BILITOT, PROT, ALBUMIN in the last 168 hours.   CBC: Recent Labs  Lab 10/27/20 0328 10/29/20 0415  WBC 2.5* 1.9*  NEUTROABS  --  0.8*  HGB 10.5* 8.9*  HCT 32.5* 28.1*  MCV 92.1 93.7  PLT 240 177     Scheduled Meds: . acetaminophen  1,000 mg Oral TID  . amLODipine  5 mg Oral Daily  . buprenorphine-naloxone  2 tablet Sublingual BID  . Chlorhexidine Gluconate  Cloth  6 each Topical Daily  . docusate sodium  100 mg Oral BID  . enoxaparin (LOVENOX) injection  70 mg Subcutaneous Q24H  . ferrous sulfate  325 mg Oral BID WC  . lidocaine  1 patch Transdermal Q24H  . mouth rinse  15 mL Mouth Rinse BID  . naproxen  250 mg Oral BID WC  . nicotine  14 mg Transdermal Daily  . pantoprazole  40 mg Oral QHS  . polyethylene glycol  17 g Oral Daily  . pregabalin  50 mg Oral QHS  . senna-docusate  2 tablet Oral QHS  . sodium chloride flush  10-40 mL Intracatheter Q12H  . sodium chloride flush  3 mL Intravenous Q12H  . sodium chloride flush  3 mL Intravenous Q12H   Continuous Infusions: . sodium chloride Stopped (10/16/20 1455)  . vancomycin 1,000 mg (11/01/20 2246)    Principal Problem:   Empyema lung (HCC) Active Problems:   Polysubstance dependence including opioid type drug with complication, episodic abuse (HCC)   Morbid obesity with BMI of 50.0-59.9, adult (HCC)   Tobacco dependence   MRSA infection   Abscess in epidural space of  thoracic spine   Consultants:  CT surgery  ID  Neurosurgery   Procedures:  Chest tube placement, thoracotomy and intubation  Right upper arm PICC line.  S/p decompressive thoracic laminectomy T11-12 on 10/15/2020   Antibiotics:  Vancomycin 4/24 >>  Maxipime 4/24 through 4/28  Ancef 5/2 through 5/3   Time spent: 35    Junious Silk ANP  [facility] 7 am - 330 pm/M-F for direct patient care and secure chat Please refer to Amion for contact info 26  days

## 2020-11-02 NOTE — Progress Notes (Signed)
Physical Therapy Treatment Patient Details Name: Derek Woods MRN: [id number] DOB: [birth year] Today's Date: 11/02/2020    History of Present Illness [name] 30 yo male who presented on 10/07/20 with shortness of breath and found to have large R empyema.  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  Medical hx includes daily heroin IVDA (previous suboxone use), asthma, morbid obesity, OSA, and smokes.    PT Comments    Patient continues to require cues for maintaining back precautions with OOB mobility. Patient overall supervision for mobility with RW. Will continue to work on progressing off of RW. Patient negotiated 2 stairs with B handrails and supervision. Continue to recommend HHPT following discharge but will continue to update as patient progresses.     Follow Up Recommendations  Home health PT;Supervision for mobility/OOB     Equipment Recommendations  Rolling Derek Woods with 5" wheels;3in1 (PT)    Recommendations for Other Services       Precautions / Restrictions Precautions Precautions: Back Precaution Booklet Issued: No Precaution Comments: reviewed back precautions Restrictions Weight Bearing Restrictions: No    Mobility  Bed Mobility Overal bed mobility: Modified Independent             General bed mobility comments: good recall of log roll technique    Transfers Overall transfer level: Needs assistance Equipment used: Rolling Derek Woods (2 wheeled) Transfers: Sit to/from Stand Sit to Stand: Supervision         General transfer comment: supervision for safety  Ambulation/Gait Ambulation/Gait assistance: Supervision Gait Distance (Feet): 200 Feet Assistive device: Rolling Derek Woods (2 wheeled) Gait Pattern/deviations: Step-through pattern;Wide base of support;Decreased  stride length Gait velocity: decreased   General Gait Details: supervision for safety. Cues for maintaining back precautions with upright posture during ambulation   Stairs Stairs: Yes Stairs assistance: Supervision Stair Management: Two rails;Step to pattern;Forwards Number of Stairs: 2 General stair comments: supervision for safety. Leading with R foot on ascent due to L knee weakness per patient   Wheelchair Mobility    Modified Rankin (Stroke Patients Only)       Balance Overall balance assessment: Mild deficits observed, not formally tested                                          Cognition Arousal/Alertness: Awake/alert Behavior During Therapy: WFL for tasks assessed/performed Overall Cognitive Status: Within Functional Limits for tasks assessed                                        Exercises      General Comments        Pertinent Vitals/Pain Pain Assessment: Faces Faces Pain Scale: Hurts a little bit Pain Location: back Pain Descriptors / Indicators: Guarding Pain Intervention(s): Monitored during session;Repositioned    Home Living                      Prior Function            PT Goals (current goals can now be found in the care plan section) Acute Rehab PT Goals Patient Stated Goal: home PT Goal Formulation:  With patient Time For Goal Achievement: 11/13/20 Potential to Achieve Goals: Good Progress towards PT goals: Progressing toward goals    Frequency    Min 3X/week      PT Plan Current plan remains appropriate    Co-evaluation              AM-PAC PT "6 Clicks" Mobility   Outcome Measure  Help needed turning from your back to your side while in a flat bed without using bedrails?: None Help needed moving from lying on your back to sitting on the side of a flat bed without using bedrails?: None Help needed moving to and from a bed to a chair (including a wheelchair)?: A Little Help  needed standing up from a chair using your arms (e.g., wheelchair or bedside chair)?: A Little Help needed to walk in hospital room?: A Little Help needed climbing 3-5 steps with a railing? : A Little 6 Click Score: 20    End of Session   Activity Tolerance: Patient tolerated treatment well Patient left: in bed;with call bell/phone within reach;with family/visitor present Nurse Communication: Mobility status PT Visit Diagnosis: Other abnormalities of gait and mobility (R26.89);Muscle weakness (generalized) (M62.81);Pain     Time: 4970-2637 PT Time Calculation (min) (ACUTE ONLY): 16 min  Charges:  $Therapeutic Activity: 8-22 mins                     Derek Woods A. Derek Woods PT, DPT Acute Rehabilitation Services Pager [phone] Office [phone]    Derek Woods 11/02/2020, 4:59 PM

## 2020-11-03 DIAGNOSIS — F17200 Nicotine dependence, unspecified, uncomplicated: Secondary | ICD-10-CM

## 2020-11-03 NOTE — Plan of Care (Signed)

## 2020-11-03 NOTE — Progress Notes (Addendum)
Pharmacy Antibiotic Note  Derek Woods is a 30 y.o. male admitted on 10/07/2020 with Osteo.  Pharmacy has been consulted for Vancomycin dosing.   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 HepC positive antibodies. Afebrile. WBC down to 1.9. - 4/26 s/p thoracotomy ( mini thora VATS) - 5/2 s/p laminectomy/surg decompression - hemovac out  - 4/30 MRI epidural abscess/discitis/osteo T11-12  4/24 cefepime >>4/28 4/24 Vanc >> 6/13     4/28 VT 30 - hold vanc 4/29 VR 11 - vanc 2g q24h (ke 0.062, half life 11.2hr > est AUC 487) 5/4 VP 40, VT 8, AUC 575 on 2g q24h >> adjust to 1000mg  q12h (eAUC 575) 5/11 VT 12 - > continue 1 gram Q 12 5/17 VP 30,  VT 10,  calculated AUC 462.8, continue 1g q12h  4/24 BCx: collected prior to abx: neg 4/25 MRSA PCR +  4/26 soft tissue Cx: MRSA 4/26 pleural fluid: MRSA 5/2 epidural abscess: rare MRSA  Plan: Continue vancomycin to 1000mg  IV q12h (last caculated AUC 462.8) Vanc for osteo, end date 6/13 (treat in hospital, 6 wks from 5/2l) Weekly levels - next 5/24    Height: 5\' 6"  (167.6 cm) Weight: (!) 146.7 kg (323 lb 6.4 oz) IBW/kg (Calculated) : 63.8  Temp (24hrs), Avg:98.8 F (37.1 C), Min:98.6 F (37 C), Max:98.9 F (37.2 C)  Recent Labs  Lab 10/29/20 0415 10/30/20 0500 10/30/20 1200 10/30/20 2130 11/02/20 0500  WBC 1.9*  --   --   --   --   CREATININE  --  0.82  --   --  0.75  VANCOTROUGH  --   --   --  10*  --   VANCOPEAK  --   --  30  --   --     Estimated Creatinine Clearance: 183.6 mL/min (by C-G formula based on SCr of 0.75 mg/dL).    Allergies  Allergen Reactions  . Augmentin [Amoxicillin-Pot Clavulanate] Other (See Comments)    Pt does not recall, childhood  . Ceclor [Cefaclor] Other (See Comments)    Pt does not recall, childhood  . Sulfa Antibiotics Hives    Halford Goetzke S. 11/01/20, PharmD, BCPS Clinical Staff Pharmacist [email or web address]  11/01/20 11/03/2020 8:46 AM

## 2020-11-03 NOTE — Progress Notes (Signed)
[facility] PROGRESS NOTE  Derek Woods [id number]:[id number] DOB: 1990/07/23 DOA: 10/07/2020 PCP: Pcp, No   HPI: 30 year old male with past medical history of daily IV drug abuse with heroin and has used Suboxone from the streets.  He also has asthma, morbid obesity with a BMI of 52, daily smoker, suspected sleep apnea.  He presented to [facility] on 4/24 with back pain, right flank pain and shortness of breath x1 week.  CT of the chest consistent with large right empyema and he was admitted by the hospitalist team.  Subsequently, patient was taken to the OR on 4/26 for VATS and drainage of right empyema but due to severity of disease thoracotomy was completed instead.  He was a difficult intubation prior to the procedure and was unable to be weaned from the ventilator postoperatively.  There were also concerns of possible difficulty with appropriate oxygenation during the procedure.  He was subsequently admitted to the ICU under the care of PCCM.  Eventually extubated on 4/27 and transferred out of ICU on 4/28.  Posterior chest tube removed 4/29.  Intraoperative cultures grew MRSA.  Antibiotics have been de-escalated to vancomycin.  ID consulted on 4/30.  MRI of thoracic spine was done and demonstrated T11/T12 discitis and possible abscess.  Neurosurgery was consulted and he subsequently underwent decompressive thoracic laminectomy on 5/2.  Incidental finding of hep C positive antibodies.   Subjective: Today, patient is alert awake.  Denies specific complaints.  Denies any pain nausea vomiting shortness of breath.  Has been moving his bowels.  Objective: Vitals:   11/02/20 1957 11/02/20 2320  BP: (!) 144/84 135/83  Pulse: 93 87  Resp:    Temp: 98.6 F (37 C) 98.9 F (37.2 C)  SpO2: 97% 100%    Intake/Output Summary (Last 24 hours) at 11/03/2020 9924 Last data filed at 11/02/2020 2250 Gross per 24 hour  Intake 480 ml  Output --  Net 480 ml   Filed Weights   10/07/20 1006   Weight: (!) 146.7 kg   Body mass index is 52.2 kg/m.  Physical exam: General: Morbidly obese built, not in obvious distress HENT:   No scleral pallor or icterus noted. Oral mucosa is moist.  Chest:  Clear breath sounds.  Diminished breath sounds bilaterally. No crackles or wheezes.  CVS: S1 &S2 heard. No murmur.  Regular rate and rhythm. Abdomen: Soft, nontender, nondistended.  Bowel sounds are heard.   Extremities: No cyanosis, clubbing or edema.  Peripheral pulses are palpable. Psych: Alert, awake and oriented, normal mood CNS:  No cranial nerve deficits.  Moves all extremities.  Ambulating with rolling walker Skin: Warm and dry.  No rashes noted.  Assessment/Plan:  Acute respiratory failure with hypercarbia and hypoxia /Right MRSA empyema s/p right thoracotomy on 10/09/2020 Currently on IV vancomycin.  Continue vancomycin last dose on 11/27/2020.   ID to be consulted again when he is approaching end date of antibiotics for reevaluation for transition to oral antibiotics +/- reimaging.    Suspected OSA/OHS Will benefit from formal outpatient testing.  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   Follow-up lumbar MRI w/o evidence of infection.    Neurosurgery recommends follow-up in 2 weeks.  Continue with pain management.  Currently on Suboxone Tylenol naproxen and p.o. Toradol.  Continue Robaxin lidocaine and Lyrica as well.  Essential hypertension Norvasc initiated this hospitalization.   Polysubstance abuse/IVDU Continue Suboxone.  Plans to follow-up at the Suboxone  clinic on discharge.  Physical deconditioning PT has recommended home health PT on discharge.  Continue physical therapy while in the hospital  Morbid obesity Would benefit from outpatient efforts on weight loss.  Positive hepatitis C antibody No known previous history of hepatitis C infection.  Antibodies  positive.  ID will follow up HCV RNA and have made a follow-up appointment at [facility] for HCV treatment on 6/21 at 8:45 AM.  Anxiety Continue as needed hydroxyzine.  Normocytic anemia/Iron deficiency anemia Hemoglobin has remained stable.  On p.o. iron.  Tobacco dependence Not on nicotine patch.   Status: Inpatient Remains inpatient appropriate because:Unsafe d/c plan, IV treatments appropriate due to intensity of illness or inability to take PO and Inpatient level of care appropriate due to severity of illness   Dispo: The patient is from: Home              Anticipated d/c is to: Home to live with mother who is disabled secondary to rheumatoid arthritis.  Post discharge transportation will be an issue.  This patient lives in the [place] area but will need to come to [facility] for the Suboxone clinic as well as for ID clinic and given this we will also arrange for at least 1 PCP appointment after discharge in the [place] area.              Patient currently is not medically stable to d/c.   Difficult to place patient Yes   Code Status: Full  Family Communication:  None  DVT prophylaxis: Lovenox subcu   Data Reviewed:  Basic Metabolic Panel: Recent Labs  Lab 10/30/20 0500 11/02/20 0500  NA 134* 136  K 4.5 4.5  CL 103 103  CO2 26 28  GLUCOSE 138* 98  BUN 19 16  CREATININE 0.82 0.75  CALCIUM 8.7* 8.7*   Liver Function Tests: No results for input(s): AST, ALT, ALKPHOS, BILITOT, PROT, ALBUMIN in the last 168 hours.   CBC: Recent Labs  Lab 10/29/20 0415  WBC 1.9*  NEUTROABS 0.8*  HGB 8.9*  HCT 28.1*  MCV 93.7  PLT 177     Scheduled Meds: . acetaminophen  1,000 mg Oral TID  . amLODipine  5 mg Oral Daily  . buprenorphine-naloxone  2 tablet Sublingual BID  . Chlorhexidine Gluconate Cloth  6 each Topical Daily  . docusate sodium  100 mg Oral BID  . enoxaparin (LOVENOX) injection  70 mg Subcutaneous Q24H  . ferrous sulfate  325 mg Oral BID WC  .  lidocaine  1 patch Transdermal Q24H  . mouth rinse  15 mL Mouth Rinse BID  . naproxen  250 mg Oral BID WC  . nicotine  14 mg Transdermal Daily  . pantoprazole  40 mg Oral QHS  . polyethylene glycol  17 g Oral Daily  . pregabalin  50 mg Oral QHS  . senna-docusate  2 tablet Oral QHS  . sodium chloride flush  10-40 mL Intracatheter Q12H  . sodium chloride flush  3 mL Intravenous Q12H  . sodium chloride flush  3 mL Intravenous Q12H   Continuous Infusions: . sodium chloride Stopped (10/16/20 1455)  . vancomycin 1,000 mg (11/02/20 2250)    Principal Problem:   Empyema lung (HCC) Active Problems:   Polysubstance dependence including opioid type drug with complication, episodic abuse (HCC)   Morbid obesity with BMI of 50.0-59.9, adult (HCC)   Tobacco dependence   MRSA infection   Abscess in epidural space of thoracic spine   Consultants:  CT surgery  ID  Neurosurgery   Procedures:  Chest tube placement, thoracotomy and intubation  Right upper arm PICC line.  S/p decompressive thoracic laminectomy T11-12 on 10/15/2020   Antibiotics:  Vancomycin 4/24 >>  Maxipime 4/24 through 4/28  Ancef 5/2 through 5/3    Joycelyn Das, MD [facility]  27  days

## 2020-11-04 NOTE — Progress Notes (Signed)
[facility] PROGRESS NOTE  Derek Woods KVQ:[id number] DOB: [birth year] DOA: 10/07/2020 PCP: Pcp, No   HPI: 30 year old male with past medical history of daily IV drug abuse with heroin and has used Suboxone from the streets.  He also has asthma, morbid obesity with a BMI of 52, daily smoker, suspected sleep apnea.  He presented to [facility] on 4/24 with back pain, right flank pain and shortness of breath x1 week.  CT of the chest consistent with large right empyema and he was admitted by the hospitalist team.  Subsequently, patient was taken to the OR on 4/26 for VATS and drainage of right empyema but due to severity of disease thoracotomy was completed instead.  He was a difficult intubation prior to the procedure and was unable to be weaned from the ventilator postoperatively.  There were also concerns of possible difficulty with appropriate oxygenation during the procedure.  He was subsequently admitted to the ICU under the care of PCCM.  Eventually extubated on 4/27 and transferred out of ICU on 4/28.  Posterior chest tube removed 4/29.  Intraoperative cultures grew MRSA.  Antibiotics have been de-escalated to vancomycin.  ID consulted on 4/30.  MRI of thoracic spine was done and demonstrated T11/T12 discitis and possible abscess.  Neurosurgery was consulted and he subsequently underwent decompressive thoracic laminectomy on 5/2.  Incidental finding of hep C positive antibodies.   Subjective: Today, patient is alert awake.  Denies any pain, nausea, shortness of breath, fever.  Objective: Vitals:   11/04/20 0832 11/04/20 1157  BP: 125/79 125/76  Pulse: 83 88  Resp: 18 16  Temp: (!) 97.5 F (36.4 C) 98.8 F (37.1 C)  SpO2: 97% 97%    Intake/Output Summary (Last 24 hours) at 11/04/2020 1351 Last data filed at 11/04/2020 0830 Gross per 24 hour  Intake 1529.96 ml  Output 700 ml  Net 829.96 ml   Filed Weights   10/07/20 1006  Weight: (!) 146.7 kg   Body mass index is  52.2 kg/m.  Physical exam:  General: Morbidly obese built, not in obvious distress HENT:   No scleral pallor or icterus noted. Oral mucosa is moist.  Chest:   Diminished breath sounds bilaterally. No crackles or wheezes.  CVS: S1 &S2 heard. No murmur.  Regular rate and rhythm. Abdomen: Soft, nontender, nondistended.  Bowel sounds are heard.   Extremities: No cyanosis, clubbing or edema.  Peripheral pulses are palpable. Psych: Alert, awake and oriented, normal mood CNS:  No cranial nerve deficits.  Moves all extremities.  Ambulating with rolling walker Skin: Warm and dry.  No rashes noted.  Assessment/Plan:  Acute respiratory failure with hypercarbia and hypoxia /Right MRSA empyema s/p right thoracotomy on 10/09/2020  Currently on IV vancomycin.  Continue vancomycin last dose on 11/27/2020.   ID to be consulted again when he is approaching end date of antibiotics for reevaluation for transition to oral antibiotics +/- reimaging.    Suspected OSA/OHS Will benefit from formal outpatient testing.  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   Follow-up lumbar MRI w/o evidence of infection.    Neurosurgery recommends follow-up in 2 weeks.  Continue with pain management.  Currently on Suboxone Tylenol naproxen and p.o. Toradol.  Continue Robaxin lidocaine and Lyrica as well.  Essential hypertension Norvasc initiated this hospitalization.   Polysubstance abuse/IVDU Continue Suboxone.  Plans to follow-up at the Suboxone clinic on discharge.  Physical deconditioning PT has recommended home  health PT on discharge.  Continue physical therapy while in the hospital  Morbid obesity Would benefit from outpatient efforts on weight loss.  Positive hepatitis C antibody No known previous history of hepatitis C infection.  Antibodies positive.  ID will follow up HCV RNA and have made a follow-up  appointment at [facility] for HCV treatment on 6/21 at 8:45 AM.  Anxiety Continue as needed hydroxyzine.  Normocytic anemia/Iron deficiency anemia Hemoglobin has remained stable.  On p.o. iron.  Tobacco dependence Not on nicotine patch.   Status: Inpatient Remains inpatient appropriate because:Unsafe d/c plan, IV treatments appropriate due to intensity of illness or inability to take PO and Inpatient level of care appropriate due to severity of illness   Dispo: The patient is from: Home              Anticipated d/c is to: Home to live with mother who is disabled secondary to rheumatoid arthritis.  Post discharge transportation will be an issue.  This patient lives in the [place] area but will need to come to [facility] for the Suboxone clinic as well as for ID clinic and given this we will also arrange for at least 1 PCP appointment after discharge in the [place] area.              Patient currently is not medically stable to d/c.   Difficult to place patient Yes   Code Status: Full  Family Communication:  None  DVT prophylaxis: Lovenox subcu   Data Reviewed:  Basic Metabolic Panel: Recent Labs  Lab 10/30/20 0500 11/02/20 0500  NA 134* 136  K 4.5 4.5  CL 103 103  CO2 26 28  GLUCOSE 138* 98  BUN 19 16  CREATININE 0.82 0.75  CALCIUM 8.7* 8.7*   Liver Function Tests: No results for input(s): AST, ALT, ALKPHOS, BILITOT, PROT, ALBUMIN in the last 168 hours.   CBC: Recent Labs  Lab 10/29/20 0415  WBC 1.9*  NEUTROABS 0.8*  HGB 8.9*  HCT 28.1*  MCV 93.7  PLT 177     Scheduled Meds: . acetaminophen  1,000 mg Oral TID  . amLODipine  5 mg Oral Daily  . buprenorphine-naloxone  2 tablet Sublingual BID  . Chlorhexidine Gluconate Cloth  6 each Topical Daily  . docusate sodium  100 mg Oral BID  . enoxaparin (LOVENOX) injection  70 mg Subcutaneous Q24H  . ferrous sulfate  325 mg Oral BID WC  . lidocaine  1 patch Transdermal Q24H  . mouth rinse  15 mL Mouth Rinse  BID  . naproxen  250 mg Oral BID WC  . nicotine  14 mg Transdermal Daily  . pantoprazole  40 mg Oral QHS  . polyethylene glycol  17 g Oral Daily  . pregabalin  50 mg Oral QHS  . senna-docusate  2 tablet Oral QHS  . sodium chloride flush  10-40 mL Intracatheter Q12H  . sodium chloride flush  3 mL Intravenous Q12H  . sodium chloride flush  3 mL Intravenous Q12H   Continuous Infusions: . sodium chloride Stopped (10/16/20 1455)  . 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  S/p decompressive thoracic laminectomy T11-12 on 10/15/2020   Antibiotics:  Vancomycin 4/24 >>  Maxipime 4/24 through 4/28  Ancef 5/2 through 5/3    Joycelyn Das, MD [facility]  28  days

## 2020-11-04 NOTE — Progress Notes (Incomplete)
Pt refused 4am vitals °

## 2020-11-05 NOTE — Plan of Care (Signed)

## 2020-11-05 NOTE — Progress Notes (Signed)
Pt wishes to not have vital signs taken at 4am. Pt was educated on the importance of vital signs, but honored pt wishes.

## 2020-11-05 NOTE — Progress Notes (Signed)
[facility] PROGRESS NOTE  Derek Woods ZOX:[id number] DOB: September 02, 1990 DOA: 10/07/2020 PCP: Pcp, No  Status: Remains inpatient appropriate because:Unsafe d/c plan, IV treatments appropriate due to intensity of illness or inability to take PO and Inpatient level of care appropriate due to severity of illness   Dispo: The patient is from: Home              Anticipated d/c is to: Home to live with mother who is disabled secondary to rheumatoid arthritis.  Post discharge transportation will be an issue.  This patient lives in the [place] area but will need to come to [facility] for the Suboxone clinic as well as for ID clinic and given this we will also arrange for at least 1 PCP appointment after discharge in the [place] area.              Patient currently is not medically stable to d/c.   Difficult to place patient Yes   Level of care: Med-Surg  Code Status: Full Family Communication: Patient DVT prophylaxis: Lovenox Vaccination status: Unknown   HPI: 30 year old male with past medical history of daily IV drug abuse with heroin and has used Suboxone from the streets.  He also has asthma, morbid obesity with a BMI of 52, daily smoker, suspected sleep apnea.  He presented to [facility] on 4/24 with back pain, right flank pain and shortness of breath x1 week.  CT of the chest consistent with large right empyema and he was admitted by the hospitalist team.  Subsequently taken to the OR on 4/26 for VATS and drainage of right empyema but due to severity of disease thoracotomy was completed instead.  He was a difficult intubation prior to the procedure and was unable to be weaned from the ventilator postoperatively.  There were also concerns of possible difficulty with appropriate oxygenation during the procedure.  He was subsequently admitted to the ICU under the care of PCCM.  Eventually extubated on 4/27 and transferred out of ICU on 4/28.  Posterior chest tube removed 4/29.   Intraoperative cultures grew MRSA.  Antibiotics have been de-escalated to vancomycin.  ID consulted on 4/30.  MRI of thoracic spine was done and demonstrated T11/T12 discitis and possible abscess.  Neurosurgery was consulted and he subsequently underwent decompressive thoracic laminectomy on 5/2.  Incidental finding of hep C positive antibodies.   Subjective: Sleeping soundly and did not awaken during my examination.  Objective: Vitals:   11/04/20 2301 11/05/20 0744  BP: 136/84 111/72  Pulse: 94 84  Resp: 17 18  Temp: 98.2 F (36.8 C) 98.3 F (36.8 C)  SpO2: 98% 95%    Intake/Output Summary (Last 24 hours) at 11/05/2020 8119 Last data filed at 11/05/2020 0012 Gross per 24 hour  Intake 400 ml  Output 700 ml  Net -300 ml   Filed Weights   10/07/20 1006  Weight: (!) 146.7 kg    Exam:  Constitutional: Sleeping soundly but at baseline Pleasant and cooperative Respiratory: Lungs are clear to auscultation and he remained stable on room air Cardiovascular: Normal heart sounds, no peripheral edema, remains normotensive. Abdomen: LBM 5/22, soft and nontender with normoactive bowel sounds.  Eating well. Neurologic: CN 2-12 grossly intact. Sensation intact, Strength 5/5 x all 4 extremities.  Ambulates with rolling walker. Psychiatric: Sleeping soundly but at baseline alert and oriented x3.   Assessment/Plan: Acute problems: Acute respiratory failure with hypercarbia and hypoxia (Resolved)/Right MRSA empyema s/p right thoracotomy on 10/09/2020 Continue vancomycin -ID recommends 6 weeks  from 5/2 with last dose due on 11/27/2020.  ID to be consulted again when he is approaching end date of antibiotics for reevaluation for transition to oral antibiotics +/- reimaging.    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   Follow-up lumbar MRI w/o evidence of infection.   Neurosurgery recommends outpatient follow-up in 2 weeks  Continue Suboxone,Tylenol, naproxen with p.o. Toradol for breakthrough pain, topical lidocaine, Robaxin, bedtime Lyrica  Essential hypertension New diagnosis- continue Norvasc   Polysubstance abuse/IVDU Continue Suboxone. Plan is to continue after discharge and follow-up at internal medicine Suboxone clinic/Dr. Oswaldo Done  Physical deconditioning Improving and able to ambulate at times for short distances without rolling walker-HHPT  Morbid obesity BMI 52.2 kg/m  Follow-up with PCP regarding weight management strategies  Other problems: Positive hepatitis C antibody No known previous history of hepatitis C infection.  Antibodies positive.  ID will follow up HCV RNA and have made a follow-up appointment at [facility] for HCV treatment on 6/21 at 8:45 AM.  Anxiety Continue as needed hydroxyzine.  Normocytic anemia/Iron deficiency anemia Hemoglobin is stable over 9.   Continue PO iron  Tobacco dependence Smoking cessation counseling provided.   Data Reviewed: Basic Metabolic Panel: Recent Labs  Lab 10/30/20 0500 11/02/20 0500  NA 134* 136  K 4.5 4.5  CL 103 103  CO2 26 28  GLUCOSE 138* 98  BUN 19 16  CREATININE 0.82 0.75  CALCIUM 8.7* 8.7*   Liver Function Tests: No results for input(s): AST, ALT, ALKPHOS, BILITOT, PROT, ALBUMIN in the last 168 hours.   CBC: No results for input(s): WBC, NEUTROABS, HGB, HCT, MCV, PLT in the last 168 hours.   Scheduled Meds: . acetaminophen  1,000 mg Oral TID  . amLODipine  5 mg Oral Daily  . buprenorphine-naloxone  2 tablet Sublingual BID  . Chlorhexidine Gluconate Cloth  6 each Topical Daily  . docusate sodium  100 mg Oral BID  . enoxaparin (LOVENOX) injection  70 mg Subcutaneous Q24H  . ferrous sulfate  325 mg Oral BID WC  . lidocaine  1 patch Transdermal Q24H  . mouth rinse  15 mL Mouth Rinse BID  .  naproxen  250 mg Oral BID WC  . nicotine  14 mg Transdermal Daily  . pantoprazole  40 mg Oral QHS  . polyethylene glycol  17 g Oral Daily  . pregabalin  50 mg Oral QHS  . senna-docusate  2 tablet Oral QHS  . sodium chloride flush  10-40 mL Intracatheter Q12H  . sodium chloride flush  3 mL Intravenous Q12H  . sodium chloride flush  3 mL Intravenous Q12H   Continuous Infusions: . sodium chloride Stopped (10/16/20 1455)  . 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  S/p decompressive thoracic laminectomy T11-12 on 10/15/2020   Antibiotics:  Vancomycin 4/24 >>  Maxipime 4/24 through 4/28  Ancef 5/2 through 5/3   Time spent: [address]  Rennis Harding ANP  [facility] 7 am - 330 pm/M-F for direct patient care and secure chat Please refer to Amion for contact info 29  days

## 2020-11-05 NOTE — Progress Notes (Signed)
Physical Therapy Treatment Patient Details Name: Derek Woods MRN: [id number] DOB: 1990-10-29 Today's Date: 11/05/2020    History of Present Illness [name] 30 yo male who presented on 10/07/20 with shortness of breath and found to have large R empyema.  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  Medical hx includes daily heroin IVDA (previous suboxone use), asthma, morbid obesity, OSA, and smokes.    PT Comments    Patient progressing towards physical therapy goals. Patient ambulated 300' with no AD and supervision, x1 standing rest break for 30 seconds. Reinforced back precautions and encouraged mobility in hallway with assistance. Continue to recommend HHPT following discharge to address deficits.      Follow Up Recommendations  Home health PT;Supervision for mobility/OOB     Equipment Recommendations  Rolling Derek Woods with 5" wheels;3in1 (PT)    Recommendations for Other Services       Precautions / Restrictions Precautions Precautions: Back Precaution Booklet Issued: No Precaution Comments: reviewed back precautions Restrictions Weight Bearing Restrictions: No    Mobility  Bed Mobility Overal bed mobility: Modified Independent                  Transfers Overall transfer level: Needs assistance Equipment used: None Transfers: Sit to/from Stand Sit to Stand: Supervision         General transfer comment: supervision for safety  Ambulation/Gait Ambulation/Gait assistance: Supervision Gait Distance (Feet): 300 Feet Assistive device: None Gait Pattern/deviations: Step-through pattern;Wide base of support;Decreased stride length Gait velocity: decreased   General Gait Details: supervision for safety. Standing rest break x 30 seconds.   Stairs              Wheelchair Mobility    Modified Rankin (Stroke Patients Only)       Balance Overall balance assessment: Mild deficits observed, not formally tested                                          Cognition Arousal/Alertness: Awake/alert Behavior During Therapy: WFL for tasks assessed/performed Overall Cognitive Status: Within Functional Limits for tasks assessed                                        Exercises      General Comments        Pertinent Vitals/Pain Pain Assessment: Faces Faces Pain Scale: Hurts a little bit Pain Location: back Pain Descriptors / Indicators: Guarding Pain Intervention(s): Monitored during session    Home Living                      Prior Function            PT Goals (current goals can now be found in the care plan section) Acute Rehab PT Goals Patient Stated Goal: home PT Goal Formulation: With patient Time For Goal Achievement: 11/13/20 Potential to Achieve Goals: Good Progress towards PT goals: Progressing toward goals    Frequency    Min 3X/week      PT Plan Current plan remains appropriate    Co-evaluation  AM-PAC PT "6 Clicks" Mobility   Outcome Measure  Help needed turning from your back to your side while in a flat bed without using bedrails?: None Help needed moving from lying on your back to sitting on the side of a flat bed without using bedrails?: None Help needed moving to and from a bed to a chair (including a wheelchair)?: A Little Help needed standing up from a chair using your arms (e.g., wheelchair or bedside chair)?: A Little Help needed to walk in hospital room?: A Little Help needed climbing 3-5 steps with a railing? : A Little 6 Click Score: 20    End of Session   Activity Tolerance: Patient tolerated treatment well Patient left: in bed;with call bell/phone within reach Nurse Communication: Mobility status PT Visit Diagnosis: Other  abnormalities of gait and mobility (R26.89);Muscle weakness (generalized) (M62.81);Pain     Time: 7673-4193 PT Time Calculation (min) (ACUTE ONLY): 21 min  Charges:  $Therapeutic Activity: 8-22 mins                     Derek Woods A. Derek Woods PT, DPT Acute Rehabilitation Services Pager [phone] Office [phone]    Derek Woods 11/05/2020, 12:50 PM

## 2020-11-06 LAB — CBC
HCT: 31.3 % — ABNORMAL LOW (ref 39.0–52.0)
Hemoglobin: 9.7 g/dL — ABNORMAL LOW (ref 13.0–17.0)
MCH: 29.4 pg (ref 26.0–34.0)
MCHC: 31 g/dL (ref 30.0–36.0)
MCV: 94.8 fL (ref 80.0–100.0)
Platelets: 203 10*3/uL (ref 150–400)
RBC: 3.3 MIL/uL — ABNORMAL LOW (ref 4.22–5.81)
RDW: 14.5 % (ref 11.5–15.5)
WBC: 3.3 10*3/uL — ABNORMAL LOW (ref 4.0–10.5)
nRBC: 0 % (ref 0.0–0.2)

## 2020-11-06 LAB — BASIC METABOLIC PANEL
Anion gap: 4 — ABNORMAL LOW (ref 5–15)
BUN: 14 mg/dL (ref 6–20)
CO2: 29 mmol/L (ref 22–32)
Calcium: 8.7 mg/dL — ABNORMAL LOW (ref 8.9–10.3)
Chloride: 105 mmol/L (ref 98–111)
Creatinine, Ser: 0.76 mg/dL (ref 0.61–1.24)
GFR, Estimated: >60 mL/min (ref >60)
Glucose, Bld: 114 mg/dL — ABNORMAL HIGH (ref 70–99)
Potassium: 4.5 mmol/L (ref 3.5–5.1)
Sodium: 138 mmol/L (ref 135–145)

## 2020-11-06 NOTE — Progress Notes (Signed)
[facility] PROGRESS NOTE  Derek Woods OVF:[id number] DOB: December 19, 1990 DOA: 10/07/2020 PCP: Pcp, No  Status: Remains inpatient appropriate because:Unsafe d/c plan, IV treatments appropriate due to intensity of illness or inability to take PO and Inpatient level of care appropriate due to severity of illness   Dispo: The patient is from: Home              Anticipated d/c is to: Home to live with mother who is disabled secondary to rheumatoid arthritis.  Post discharge transportation will be an issue.  This patient lives in the [place] area but will need to come to [facility] for the Suboxone clinic as well as for ID clinic and given this we will also arrange for at least 1 PCP appointment after discharge in the [place] area.              Patient currently is not medically stable to d/c.   Difficult to place patient Yes   Level of care: Med-Surg  Code Status: Full Family Communication: Patient DVT prophylaxis: Lovenox Vaccination status: Unknown   HPI: 30 year old male with past medical history of daily IV drug abuse with heroin and has used Suboxone from the streets.  He also has asthma, morbid obesity with a BMI of 52, daily smoker, suspected sleep apnea.  He presented to [facility] on 4/24 with back pain, right flank pain and shortness of breath x1 week.  CT of the chest consistent with large right empyema and he was admitted by the hospitalist team.  Subsequently taken to the OR on 4/26 for VATS and drainage of right empyema but due to severity of disease thoracotomy was completed instead.  He was a difficult intubation prior to the procedure and was unable to be weaned from the ventilator postoperatively.  There were also concerns of possible difficulty with appropriate oxygenation during the procedure.  He was subsequently admitted to the ICU under the care of PCCM.  Eventually extubated on 4/27 and transferred out of ICU on 4/28.  Posterior chest tube removed 4/29.   Intraoperative cultures grew MRSA.  Antibiotics have been de-escalated to vancomycin.  ID consulted on 4/30.  MRI of thoracic spine was done and demonstrated T11/T12 discitis and possible abscess.  Neurosurgery was consulted and he subsequently underwent decompressive thoracic laminectomy on 5/2.  Incidental finding of hep C positive antibodies.   Subjective: Again today and did not awaken during physical examination.  Objective: Vitals:   11/05/20 1935 11/05/20 2344  BP: (!) 147/94 (!) 129/92  Pulse: 93 91  Resp: 17 18  Temp: 98.5 F (36.9 C) 98.3 F (36.8 C)  SpO2: 99% 98%    Intake/Output Summary (Last 24 hours) at 11/06/2020 0744 Last data filed at 11/06/2020 8416 Gross per 24 hour  Intake 203 ml  Output 1250 ml  Net -1047 ml   Filed Weights   10/07/20 1006  Weight: (!) 146.7 kg    Exam:  Constitutional: Sleeping Respiratory: Anterior lungs are clear and he is on room air Cardiovascular: S1-S2, no peripheral edema, normal regular pulse Abdomen: LBM 5/22, soft nontender nondistended with normoactive bowel sounds Neurologic: CN 2-12 grossly intact. Sensation intact, Strength 5/5 x all 4 extremities.  Ambulates with rolling walker. Psychiatric: Sleeping soundly   Assessment/Plan: Acute problems: Acute respiratory failure with hypercarbia and hypoxia (Resolved)/Right MRSA empyema s/p right thoracotomy on 10/09/2020 Continue vancomycin -ID recommends 6 weeks from 5/2 with last dose due on 11/27/2020.  ID to be consulted again when he is approaching  end date of antibiotics for reevaluation for transition to oral antibiotics +/- reimaging.    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   Follow-up lumbar MRI w/o evidence of infection.   NS recommends OP follow-up in 2 weeks  Continue Suboxone,Tylenol, naproxen with p.o. Toradol for breakthrough  pain, topical lidocaine, Robaxin, bedtime Lyrica  Essential hypertension New diagnosis- continue Norvasc   Polysubstance abuse/IVDU Continue Suboxone. Plan is to continue after discharge and follow-up at internal medicine Suboxone clinic/Dr. Oswaldo Done  Physical deconditioning Improving and able to ambulate at times for short distances without rolling walker-HHPT  Morbid obesity BMI 52.2 kg/m  Follow-up with PCP regarding weight management strategies  Other problems: Positive hepatitis C antibody No known previous history of hepatitis C infection.  Antibodies positive.  ID will follow up HCV RNA and have made a follow-up appointment at [facility] for HCV treatment on 6/21 at 8:45 AM.  Anxiety Continue as needed hydroxyzine.  Normocytic anemia/Iron deficiency anemia Hemoglobin is stable over 9.   Continue PO iron  Tobacco dependence Smoking cessation counseling provided.   Data Reviewed: Basic Metabolic Panel: Recent Labs  Lab 11/02/20 0500 11/06/20 0625  NA 136 138  K 4.5 4.5  CL 103 105  CO2 28 29  GLUCOSE 98 114*  BUN 16 14  CREATININE 0.75 0.76  CALCIUM 8.7* 8.7*   Liver Function Tests: No results for input(s): AST, ALT, ALKPHOS, BILITOT, PROT, ALBUMIN in the last 168 hours.   CBC: Recent Labs  Lab 11/06/20 0625  WBC 3.3*  HGB 9.7*  HCT 31.3*  MCV 94.8  PLT 203     Scheduled Meds: . acetaminophen  1,000 mg Oral TID  . amLODipine  5 mg Oral Daily  . buprenorphine-naloxone  2 tablet Sublingual BID  . Chlorhexidine Gluconate Cloth  6 each Topical Daily  . docusate sodium  100 mg Oral BID  . enoxaparin (LOVENOX) injection  70 mg Subcutaneous Q24H  . ferrous sulfate  325 mg Oral BID WC  . lidocaine  1 patch Transdermal Q24H  . mouth rinse  15 mL Mouth Rinse BID  . naproxen  250 mg Oral BID WC  . nicotine  14 mg Transdermal Daily  . pantoprazole  40 mg Oral QHS  . polyethylene glycol  17 g Oral Daily  . pregabalin  50 mg Oral QHS  . senna-docusate   2 tablet Oral QHS  . sodium chloride flush  10-40 mL Intracatheter Q12H  . sodium chloride flush  3 mL Intravenous Q12H  . sodium chloride flush  3 mL Intravenous Q12H   Continuous Infusions: . sodium chloride Stopped (10/16/20 1455)  . vancomycin 1,000 mg (11/05/20 2205)    Principal Problem:   Empyema lung (HCC) Active Problems:   Polysubstance dependence including opioid type drug with complication, episodic abuse (HCC)   Morbid obesity with BMI of 50.0-59.9, adult (HCC)   Tobacco dependence   MRSA infection   Abscess in epidural space of thoracic spine   Consultants:  CT surgery  ID  Neurosurgery   Procedures:  Chest tube placement, thoracotomy and intubation  Right upper arm PICC line.  S/p decompressive thoracic laminectomy T11-12 on 10/15/2020   Antibiotics:  Vancomycin 4/24 >>  Maxipime 4/24 through 4/28  Ancef 5/2 through 5/3   Time spent: 35    Junious Silk ANP  [facility] 7 am - 330 pm/M-F for direct patient care and secure chat  Please refer to Amion for contact info 30  days

## 2020-11-06 NOTE — Progress Notes (Signed)
PT Cancellation Note  Patient Details Name: Derek Woods MRN: [id number] DOB: 09-06-1990   Cancelled Treatment:    Reason Eval/Treat Not Completed: Patient declined, no reason specified - pt states "not today, I am not in the right mindset, not today".  Marye Round, PT DPT Acute Rehabilitation Services Pager [phone]  Office [phone]    Truddie Coco 11/06/2020, 3:28 PM

## 2020-11-07 NOTE — Progress Notes (Signed)
PROGRESS NOTE    Derek Woods  ZOX:[id number] DOB: Nov 03, 1990 DOA: 10/07/2020 PCP: Pcp, No   Brief Narrative:  HPI On 10/07/2020 by Dr. Jonah BlueJennifer Yates Derek Woods is a 30 y.o. male with medical history significant of asthma; anxiety; morbid obesity; and polysubstance abuse presenting with R flank pain and back pain.  He noticed onset of back pain, breathing difficulty worse than his usual asthma.  It is hard to stand and move, hard to do anything.  Symptoms have been present for a couple of days with SOB, but he has had back pain for about 3 weeks.  No fevers.  No sick contacts.  Last heroin use was yesterday AM.  He has been on Subutex for a few months and did well until he broke his foot and couldn't make it to his drug tests.  He would like to start back on Suboxone.    He reports that he has a skin condition causing diffuse pustules with scarring.  He can't remember the name of it.  He previously had PNA and pleural effusion in January; [facility], it was aspiration PNA after heroin OD.   Interim history Patient was admitted for empyema noted on CT of the chest.  Was taken to the OR on 4/26 for VATS and drainage of the right empyema but due to severity of disease, thoracotomy was completed instead.  Patient was a difficult intubation prior to the procedure and was able to be weaned off from the ventilator postoperatively.  Patient was admitted to the ICU under the care of PCCM and was extubated on 4/27 and transferred out to the ICU on 4/28.  Chest tube was removed on 4/29.  Intraoperative cultures grew MRSA and antibiotics were de-escalated to vancomycin.  Infectious disease consulted on 4/30.  MRI of the thoracic spine showed T11/T12 discitis and possible abscess.  Neurosurgery consulted and patient underwent decompressive thoracic laminectomy on 5/12.  Patient noted to also have incidental finding of hepatitis C positive antibodies.  Patient will need to continue IV antibiotics through  11/27/2020. Assessment & Plan   Acute respiratory failure with hypercarbia and hypoxia secondary to right MRSA empyema -Status post thoracotomy on 4/26 2022. -Cultures grew MRSA -Currently on IV antibiotics per recommendations of infectious disease, last dose on 11/27/2020. 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 Oswaldo DoneVincent, internal medicine clinic after discharge  Physical deconditioning -PT working with patient recommending home health  Morbid obesity -BMI 52 -Patient wanting to follow-up with PCP to discuss lifestyle modifications  Hepatitis C antibody positive -No known previous history of hepatitis C infection -ID will follow up with patient on 6/21 at 8:45 AM  Anxiety -Continue hydroxyzine as needed  Normocytic anemia/iron deficiency anemia -Continue supplemental iron replacement -Last hemoglobin 9.7  Tobacco dependence -Smoking cessation discussed  DVT Prophylaxis  lovenox  Code Status: Full  Family Communication: None at bedside  Disposition  Plan:  Status is: Inpatient  Remains inpatient appropriate because:IV treatments appropriate due to intensity of illness or inability to take PO   Dispo: The patient is from:  Home              Anticipated d/c is to: Home              Patient currently is not medically stable to d/c.   Difficult to place patient No   Consultants Cardiothoracic surgery Infectious disease Neurosurgery  Procedures  Chest tube placement, thoracotomy and intubation RUE PICC line placement Decompressive thoracic laminectomy T11-12  Antibiotics   Anti-infectives (From admission, onward)   Start     Dose/Rate Route Frequency Ordered Stop   10/18/20 1000  vancomycin (VANCOREADY) IVPB 1000 mg/200 mL        1,000 mg 200 mL/hr over 60 Minutes Intravenous Every 12 hours 10/17/20 1601 11/25/20 2359   10/15/20 1830  ceFAZolin (ANCEF) IVPB 2g/100 mL premix        2 g 200 mL/hr over 30 Minutes Intravenous Every 8 hours 10/15/20 1744 10/16/20 0659   10/13/20 1200  vancomycin (VANCOREADY) IVPB 2000 mg/400 mL  Status:  Discontinued        2,000 mg 200 mL/hr over 120 Minutes Intravenous Every 24 hours 10/12/20 1115 10/17/20 1559   10/12/20 1215  vancomycin (VANCOREADY) IVPB 500 mg/100 mL        500 mg 100 mL/hr over 60 Minutes Intravenous  Once 10/12/20 1115 10/12/20 1329   10/12/20 0600  vancomycin (VANCOREADY) IVPB 1500 mg/300 mL  Status:  Discontinued        1,500 mg 150 mL/hr over 120 Minutes Intravenous Every 24 hours 10/12/20 0541 10/12/20 0556   10/12/20 0600  vancomycin (VANCOREADY) IVPB 1500 mg/300 mL  Status:  Discontinued        1,500 mg 150 mL/hr over 120 Minutes Intravenous Every 18 hours 10/12/20 0556 10/12/20 1115   10/11/20 1343  vancomycin variable dose per unstable renal function (pharmacist dosing)  Status:  Discontinued         Does not apply See admin instructions 10/11/20 1344 10/12/20 0556   10/10/20 0400  vancomycin (VANCOREADY) IVPB 1250 mg/250 mL  Status:  Discontinued        1,250 mg 166.7 mL/hr over 90 Minutes Intravenous Every 8 hours 10/09/20 1835 10/11/20 1344   10/09/20 2000  vancomycin (VANCOREADY) IVPB 1250 mg/250 mL  Status:  Discontinued         1,250 mg 166.7 mL/hr over 90 Minutes Intravenous Every 8 hours 10/09/20 1117 10/09/20 1835   10/09/20 1900  vancomycin (VANCOCIN) 2,500 mg in sodium chloride 0.9 % 500 mL IVPB        2,500 mg 250 mL/hr over 120 Minutes Intravenous  Once 10/09/20 1835 10/09/20 2137   10/09/20 1200  vancomycin (VANCOCIN) 2,500 mg in sodium chloride 0.9 % 500 mL IVPB  Status:  Discontinued        2,500 mg 250 mL/hr over 120 Minutes Intravenous  Once 10/09/20 1113 10/09/20 1835   10/07/20 2200  vancomycin (VANCOREADY) IVPB 2000 mg/400 mL  Status:  Discontinued        2,000 mg 200 mL/hr over 120 Minutes Intravenous Every 12 hours 10/07/20 1251 10/07/20 1437   10/07/20 2000  ceFEPIme (MAXIPIME) 2 g in sodium chloride 0.9 % 100 mL IVPB  Status:  Discontinued        2 g 200 mL/hr over 30 Minutes Intravenous Every 8 hours 10/07/20 1251  10/11/20 1342   10/07/20 1145  ceFEPIme (MAXIPIME) 2 g in sodium chloride 0.9 % 100 mL IVPB        2 g 200 mL/hr over 30 Minutes Intravenous  Once 10/07/20 1048 10/07/20 1351   10/07/20 1100  vancomycin (VANCOCIN) 2,500 mg in sodium chloride 0.9 % 500 mL IVPB  Status:  Discontinued        2,500 mg 250 mL/hr over 120 Minutes Intravenous  Once 10/07/20 1048 10/07/20 1437      Subjective:   Derek Woods seen and examined today.  Patient upset that he is awake this morning wishes to go back to sleep and be left alone.  Not very conversant or interactive.  Would like to have sutures removed and take a shower.  Objective:   Vitals:   11/07/20 0340 11/07/20 0910 11/07/20 0949 11/07/20 1126  BP: 127/70 114/76 (!) 148/64 (!) 152/104  Pulse: 85 84 60 92  Resp: 18 18 16 20   Temp: 98.1 F (36.7 C) 98.2 F (36.8 C) 98.7 F (37.1 C) 98.6 F (37 C)  TempSrc: Oral Oral Oral Oral  SpO2: 97% 96% 99% 98%  Weight:      Height:       No intake or output data in the 24 hours ending 11/07/20 1349 Filed Weights   10/07/20 1006  Weight: (!) 146.7 kg    Exam  General: Well developed,  well nourished, NAD, appears stated age  HEENT: NCAT, mucous membranes moist.   Cardiovascular: S1 S2 auscultated, RRR.  Respiratory: Clear to auscultation bilaterally   Abdomen: Soft, obese, nontender, nondistended, + bowel sounds  Extremities: warm dry without cyanosis clubbing or edema  Neuro: AAOx3, nonfocal  Psych: Flat   Data Reviewed: I have personally reviewed following labs and imaging studies  CBC: Recent Labs  Lab 11/06/20 0625  WBC 3.3*  HGB 9.7*  HCT 31.3*  MCV 94.8  PLT 203   Basic Metabolic Panel: Recent Labs  Lab 11/02/20 0500 11/06/20 0625  NA 136 138  K 4.5 4.5  CL 103 105  CO2 28 29  GLUCOSE 98 114*  BUN 16 14  CREATININE 0.75 0.76  CALCIUM 8.7* 8.7*   GFR: Estimated Creatinine Clearance: 183.6 mL/min (by C-G formula based on SCr of 0.76 mg/dL). Liver Function Tests: No results for input(s): AST, ALT, ALKPHOS, BILITOT, PROT, ALBUMIN in the last 168 hours. No results for input(s): LIPASE, AMYLASE in the last 168 hours. No results for input(s): AMMONIA in the last 168 hours. Coagulation Profile: No results for input(s): INR, PROTIME in the last 168 hours. Cardiac Enzymes: No results for input(s): CKTOTAL, CKMB, CKMBINDEX, TROPONINI in the last 168 hours. BNP (last 3 results) No results for input(s): PROBNP in the last 8760 hours. HbA1C: No results for input(s): HGBA1C in the last 72 hours. CBG: No results for input(s): GLUCAP in the last 168 hours. Lipid Profile: No results for input(s): CHOL, HDL, LDLCALC, TRIG, CHOLHDL, LDLDIRECT in the last 72 hours. Thyroid Function Tests: No results for input(s): TSH, T4TOTAL, FREET4, T3FREE, THYROIDAB in the last 72 hours. Anemia Panel: No results for input(s): VITAMINB12, FOLATE, FERRITIN, TIBC, IRON, RETICCTPCT in the last 72 hours. Urine analysis: No results found for: COLORURINE, APPEARANCEUR, LABSPEC, PHURINE, GLUCOSEU, HGBUR, BILIRUBINUR, KETONESUR, PROTEINUR, UROBILINOGEN, NITRITE,  LEUKOCYTESUR Sepsis Labs: @LABRCNTIP (procalcitonin:4,lacticidven:4)  )No results found for this or any previous visit (from the past 240 hour(s)).    Radiology Studies: No results found.   Scheduled Meds: . acetaminophen  1,000 mg  Oral TID  . amLODipine  5 mg Oral Daily  . buprenorphine-naloxone  2 tablet Sublingual BID  . Chlorhexidine Gluconate Cloth  6 each Topical Daily  . docusate sodium  100 mg Oral BID  . enoxaparin (LOVENOX) injection  70 mg Subcutaneous Q24H  . ferrous sulfate  325 mg Oral BID WC  . lidocaine  1 patch Transdermal Q24H  . mouth rinse  15 mL Mouth Rinse BID  . naproxen  250 mg Oral BID WC  . nicotine  14 mg Transdermal Daily  . pantoprazole  40 mg Oral QHS  . polyethylene glycol  17 g Oral Daily  . pregabalin  50 mg Oral QHS  . senna-docusate  2 tablet Oral QHS  . sodium chloride flush  10-40 mL Intracatheter Q12H  . sodium chloride flush  3 mL Intravenous Q12H  . sodium chloride flush  3 mL Intravenous Q12H   Continuous Infusions: . sodium chloride Stopped (10/16/20 1455)  . vancomycin 1,000 mg (11/07/20 1044)     LOS: 31 days   Time Spent in minutes   45 minutes  Shakemia Madera D.O. on 11/07/2020 at 1:49 PM  Between 7am to 7pm - Please see pager noted on [email or web address]  After 7pm go to [email or web address]  And look for the night coverage person covering for me after hours  [facility] Office  [phone]

## 2020-11-07 NOTE — Progress Notes (Signed)
Physical Therapy Treatment Patient Details Name: Derek Woods MRN: [id number] DOB: 02-Apr-1991 Today's Date: 11/07/2020    History of Present Illness [name] 30 yo male who presented on 10/07/20 with shortness of breath and found to have large R empyema.  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  Medical hx includes daily heroin IVDA (previous suboxone use), asthma, morbid obesity, OSA, and smokes.    PT Comments    Patient continues to be limited by decreased activity tolerance and weakness. Patient ambulated 200' with supervision and no AD. Encouraged hallway ambulation with nursing staff, not sure if patient is receptive or willing to perform outside of therapy. Re-educated on back precautions with patient requiring cues to maintain during mobility. Continue to recommend HHPT following discharge to maximize functional mobility and safety.     Follow Up Recommendations  Home health PT;Supervision for mobility/OOB     Equipment Recommendations  Rolling Daphnie Venturini with 5" wheels;3in1 (PT)    Recommendations for Other Services       Precautions / Restrictions Precautions Precautions: Back Precaution Booklet Issued: No Precaution Comments: reviewed back precautions, cueing required for maintaining precautions Restrictions Weight Bearing Restrictions: No    Mobility  Bed Mobility Overal bed mobility: Modified Independent                  Transfers Overall transfer level: Needs assistance Equipment used: None Transfers: Sit to/from Stand Sit to Stand: Supervision         General transfer comment: supervision for safety  Ambulation/Gait Ambulation/Gait assistance: Supervision Gait Distance (Feet): 200 Feet Assistive device: None Gait Pattern/deviations: Step-through pattern;Wide  base of support;Decreased stride length Gait velocity: decreased   General Gait Details: supervision for safety. Standing rest break x 30 seconds.   Stairs             Wheelchair Mobility    Modified Rankin (Stroke Patients Only)       Balance Overall balance assessment: Mild deficits observed, not formally tested                                          Cognition Arousal/Alertness: Awake/alert Behavior During Therapy: WFL for tasks assessed/performed Overall Cognitive Status: Within Functional Limits for tasks assessed                                        Exercises General Exercises - Lower Extremity Ankle Circles/Pumps: AROM;5 reps Long Arc Quad: AROM;Both;10 reps;Seated Hip Flexion/Marching: AROM;10 reps    General Comments        Pertinent Vitals/Pain Pain Assessment: Faces Faces Pain Scale: Hurts a little bit Pain Location: back Pain Descriptors / Indicators: Guarding Pain Intervention(s): Monitored during session    Home Living                      Prior Function            PT Goals (current goals can now be found in the care plan section) Acute Rehab PT Goals Patient Stated Goal: home PT Goal Formulation: With patient Time For Goal Achievement: 11/13/20 Potential to Achieve  Goals: Good Progress towards PT goals: Progressing toward goals    Frequency    Min 3X/week      PT Plan Current plan remains appropriate    Co-evaluation              AM-PAC PT "6 Clicks" Mobility   Outcome Measure  Help needed turning from your back to your side while in a flat bed without using bedrails?: None Help needed moving from lying on your back to sitting on the side of a flat bed without using bedrails?: None Help needed moving to and from a bed to a chair (including a wheelchair)?: A Little Help needed standing up from a chair using your arms (e.g., wheelchair or bedside chair)?: A Little Help  needed to walk in hospital room?: A Little Help needed climbing 3-5 steps with a railing? : A Little 6 Click Score: 20    End of Session   Activity Tolerance: Patient tolerated treatment well Patient left: in bed;with call bell/phone within reach Nurse Communication: Mobility status PT Visit Diagnosis: Other abnormalities of gait and mobility (R26.89);Muscle weakness (generalized) (M62.81);Pain     Time: 2060-1561 PT Time Calculation (min) (ACUTE ONLY): 17 min  Charges:  $Therapeutic Activity: 8-22 mins                     Malachy Coleman A. Dan Humphreys PT, DPT Acute Rehabilitation Services Pager [phone] Office [phone]    [name] 11/07/2020, 1:07 PM

## 2020-11-07 NOTE — TOC Progression Note (Signed)
Transition of Care [facility]) - Progression Note    Patient Details  Name: Derek Woods MRN: [id number] Date of Birth: [birth year]  [facility] CM/SW Contact  Janae Bridgeman, RN Phone Number: 11/07/2020, 12:08 PM  Clinical Narrative:    CM and MSW with DTP Team continue to follow the patient for transitions of care needs.  The patient will remain hospitalized here at [facility] hospital for IV antibiotics, Vancomycin, through November 27, 2020 since patient with prior history of IV drug abuse.  The patient will follow up with ID clinic, Suboxone clinic with Dr. Oswaldo Done, and [facility] and Wellness after discharge from the hospital.  Appointments with be coordinated closer to discharge from the hospital.   Expected Discharge Plan: Home w Home Health Services Barriers to Discharge: Continued Medical Work up,Inadequate or no insurance,Active Substance Use with PICC Line  Expected Discharge Plan and Services Expected Discharge Plan: Home w Home Health Services In-house Referral: Clinical Social [facility],Financial Counselor Discharge Planning Services: CM Consult,Indigent Health Clinic,Medication Assistance,Follow-up appt scheduled Post Acute Care Choice: Durable Medical Equipment,Home Health Living arrangements for the past 2 months: Apartment                 DME Arranged: 3-N-1,Walker rolling (Patient will need bariatric RW and 3:1 closer to discharge home from the hospital.) DME Agency: [employer]                   Social Determinants of Health (SDOH) Interventions    Readmission Risk Interventions Readmission Risk Prevention Plan 10/30/2020  Transportation Screening Complete  PCP or Specialist Appt within 5-7 Days Complete  Home Care Screening Complete  Medication Review (RN CM) Complete

## 2020-11-08 LAB — FUNGUS CULTURE WITH STAIN

## 2020-11-08 LAB — FUNGUS CULTURE RESULT

## 2020-11-08 LAB — FUNGAL ORGANISM REFLEX

## 2020-11-08 LAB — VANCOMYCIN, TROUGH: Vancomycin Tr: 12 ug/mL — ABNORMAL LOW (ref 15–20)

## 2020-11-08 NOTE — Progress Notes (Signed)
Pharmacy Antibiotic Note  Derek Woods is a 30 y.o. male admitted on 10/07/2020 with Osteo.  Pharmacy has been consulted for Vancomycin dosing.   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 HepC positive antibodies. Afebrile. WBC down to 1.9. - 4/26 s/p thoracotomy ( mini thora VATS) - 5/2 s/p laminectomy/surg decompression - hemovac out  - 4/30 MRI epidural abscess/discitis/osteo T11-12  4/24 cefepime >>4/28 4/24 Vanc >> 6/13     4/28 VT 30 - hold vanc 4/29 VR 11 - vanc 2g q24h (ke 0.062, half life 11.2hr > est AUC 487) 5/4 VP 40, VT 8, AUC 575 on 2g q24h >> adjust to 1000mg  q12h (eAUC 575) 5/11 VT 12 - > continue 1 gram Q 12 5/17 VP 30,  VT 10,  calculated AUC 462.8, continue 1g q12h 5/26 VT 12 -> same as other values  4/24 BCx: collected prior to abx: neg 4/25 MRSA PCR +  4/26 soft tissue Cx: MRSA 4/26 pleural fluid: MRSA 5/2 epidural abscess: rare MRSA  Plan: Continue vancomycin to 1000mg  IV q12h  Vanc for osteo, end date 6/13 (treat in hospital, 6 wks from 5/2l) Weekly levels - next 6/2    Height: 5\' 6"  (167.6 cm) Weight: (!) 146.7 kg (323 lb 6.4 oz) IBW/kg (Calculated) : 63.8  Temp (24hrs), Avg:98.2 F (36.8 C), Min:97.8 F (36.6 C), Max:98.9 F (37.2 C)  Recent Labs  Lab 11/02/20 0500 11/06/20 0625 11/08/20 0930  WBC  --  3.3*  --   CREATININE 0.75 0.76  --   VANCOTROUGH  --   --  12*    Estimated Creatinine Clearance: 183.6 mL/min (by C-G formula based on SCr of 0.76 mg/dL).    Allergies  Allergen Reactions  . Augmentin [Amoxicillin-Pot Clavulanate] Other (See Comments)    Pt does not recall, childhood  . Ceclor [Cefaclor] Other (See Comments)    Pt does not recall, childhood  . Sulfa Antibiotics Hives    Thank you 11/04/20, PharmD [email or web address]  11/08/2020 1:00 PM

## 2020-11-08 NOTE — Progress Notes (Signed)
Occupational Therapy Treatment Patient Details Name: Derek Woods MRN: [id number] DOB: 08-Oct-1990 Today's Date: 11/08/2020    History of present illness [name] 30 yo male who presented on 10/07/20 with shortness of breath and found to have large R empyema.  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  Medical hx includes daily heroin IVDA (previous suboxone use), asthma, morbid obesity, OSA, and smokes.   OT comments  Telemetry called and PICC line covered.  Patient able to take a shower with increased time, effort, hip kit and rest breaks this date.  Patient did not require anything other than supervision while showering and setup of supplies.  Patient now walking in his room ad lib, barrier is decreased activity and stand tolerance.  OT will follow a few more weeks to ensure compliance and safety, but home with intermittent assist is recommended.    Follow Up Recommendations  No OT follow up;Supervision - Intermittent    Equipment Recommendations  None recommended by OT    Recommendations for Other Services      Precautions / Restrictions Precautions Precautions: Back Precaution Booklet Issued: No Precaution Comments: reviewed back precautions, cueing required for maintaining precautions Restrictions Weight Bearing Restrictions: No       Mobility Bed Mobility Overal bed mobility: Modified Independent                  Transfers Overall transfer level: Needs assistance Equipment used: None Transfers: Sit to/from Stand Sit to Stand: Supervision         General transfer comment: supervision for safety    Balance Overall balance assessment: Mild deficits observed, not formally tested                                         ADL either performed or assessed with  clinical judgement   ADL           Upper Body Bathing: Supervision/ safety;Standing   Lower Body Bathing: Supervison/ safety;Sit to/from stand;Adhering to back precautions;With adaptive equipment   Upper Body Dressing : Set up;Sitting   Lower Body Dressing: Modified independent;Sit to/from stand;With adaptive equipment   Toilet Transfer: Independent;Ambulation   Toileting- Clothing Manipulation and Hygiene: Modified independent;Sitting/lateral lean         General ADL Comments: patient walking ad lib in his room without assist or AD                       Cognition Arousal/Alertness: Awake/alert Behavior During Therapy: WFL for tasks assessed/performed Overall Cognitive Status: Within Functional Limits for tasks assessed                                                            Pertinent Vitals/ Pain       Pain Assessment: Faces Faces Pain Scale: Hurts a little bit Pain Location: back Pain Descriptors / Indicators: Guarding Pain Intervention(s): Monitored during session  Frequency  Min 1X/week        Progress Toward Goals  OT Goals(current goals can now be found in the care plan section)  Progress towards OT goals: Progressing toward goals  Acute Rehab OT Goals Patient Stated Goal: home OT Goal Formulation: With patient Time For Goal Achievement: 11/20/20 Potential to Achieve Goals: Good  Plan Frequency remains appropriate    Co-evaluation                 AM-PAC OT "6 Clicks" Daily Activity     Outcome Measure   Help from another person eating meals?: None Help from another person taking care of personal grooming?: None Help from another person toileting, which includes using toliet, bedpan, or urinal?: None Help from another person bathing (including washing, rinsing, drying)?: None Help from another person to put on and taking  off regular upper body clothing?: None Help from another person to put on and taking off regular lower body clothing?: None 6 Click Score: 24    End of Session    OT Visit Diagnosis: Unsteadiness on feet (R26.81)   Activity Tolerance Patient tolerated treatment well   Patient Left in bed;with call bell/phone within reach   Nurse Communication          Time: 1225-8346 OT Time Calculation (min): 27 min  Charges: OT General Charges $OT Visit: 1 Visit OT Treatments $Self Care/Home Management : 23-37 mins  11/08/2020  Rich, OTR/L  Acute Rehabilitation Services  Office:  [phone]    Metta Clines 11/08/2020, 3:35 PM

## 2020-11-08 NOTE — Progress Notes (Signed)
Physical Therapy Treatment Patient Details Name: Derek Woods MRN: [id number] DOB: 1991-03-06 Today's Date: 11/08/2020    History of Present Illness [name] 30 yo male who presented on 10/07/20 with shortness of breath and found to have large R empyema.  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  Medical hx includes daily heroin IVDA (previous suboxone use), asthma, morbid obesity, OSA, and smokes.    PT Comments    Patient ambulated x200' and x150' with 2 minute standing rest break due to 3/4 DOE. Patient negotiated 4 stairs with B handrails and supervision. Educated and encouraged patient to ambulate in hallways during the weekend to assist building endurance. Continue to recommend HHPT following discharge to maximize functional mobility.     Follow Up Recommendations  Home health PT;Supervision for mobility/OOB     Equipment Recommendations  3in1 (PT)    Recommendations for Other Services       Precautions / Restrictions Precautions Precautions: Back Precaution Booklet Issued: No Precaution Comments: reviewed back precautions, cueing required for maintaining precautions Restrictions Weight Bearing Restrictions: No    Mobility  Bed Mobility Overal bed mobility: Modified Independent                  Transfers Overall transfer level: Needs assistance Equipment used: None Transfers: Sit to/from Stand Sit to Stand: Supervision         General transfer comment: supervision for safety  Ambulation/Gait Ambulation/Gait assistance: Supervision Gait Distance (Feet): 200 Feet (x150) Assistive device: None Gait Pattern/deviations: Step-through pattern;Wide base of support;Decreased stride length Gait velocity: decreased   General Gait Details: supervision for safety. Standing  rest break x 2 minutes   Stairs Stairs: Yes Stairs assistance: Supervision Stair Management: Two rails;Step to pattern;Forwards Number of Stairs: 4 General stair comments: supervision for safety   Wheelchair Mobility    Modified Rankin (Stroke Patients Only)       Balance Overall balance assessment: Mild deficits observed, not formally tested                                          Cognition Arousal/Alertness: Awake/alert Behavior During Therapy: WFL for tasks assessed/performed Overall Cognitive Status: Within Functional Limits for tasks assessed                                        Exercises      General Comments        Pertinent Vitals/Pain Pain Assessment: Faces Faces Pain Scale: Hurts a little bit Pain Location: back Pain Descriptors / Indicators: Guarding Pain Intervention(s): Monitored during session;Limited activity within patient's tolerance    Home Living                      Prior Function            PT Goals (current goals can now be found in the care plan section) Acute Rehab PT Goals Patient Stated Goal: home PT Goal Formulation: With patient Time For Goal Achievement: 11/13/20 Potential to Achieve Goals: Good Progress towards PT goals: Progressing toward goals    Frequency    Min  3X/week      PT Plan Current plan remains appropriate    Co-evaluation              AM-PAC PT "6 Clicks" Mobility   Outcome Measure  Help needed turning from your back to your side while in a flat bed without using bedrails?: None Help needed moving from lying on your back to sitting on the side of a flat bed without using bedrails?: None Help needed moving to and from a bed to a chair (including a wheelchair)?: A Little Help needed standing up from a chair using your arms (e.g., wheelchair or bedside chair)?: A Little Help needed to walk in hospital room?: A Little Help needed climbing 3-5 steps  with a railing? : A Little 6 Click Score: 20    End of Session   Activity Tolerance: Patient tolerated treatment well Patient left: in bed;with call bell/phone within reach Nurse Communication: Mobility status PT Visit Diagnosis: Other abnormalities of gait and mobility (R26.89);Muscle weakness (generalized) (M62.81);Pain     Time: 9323-5573 PT Time Calculation (min) (ACUTE ONLY): 23 min  Charges:  $Therapeutic Activity: 23-37 mins                     [name] A. Dan Humphreys PT, DPT Acute Rehabilitation Services Pager [phone] Office [phone]    [name] 11/08/2020, 3:10 PM

## 2020-11-08 NOTE — Progress Notes (Signed)
[facility] PROGRESS NOTE  Derek Woods ZOX:[id number] DOB: January 15, 1991 DOA: 10/07/2020 PCP: Pcp, No  Status: Remains inpatient appropriate because:Unsafe d/c plan, IV treatments appropriate due to intensity of illness or inability to take PO and Inpatient level of care appropriate due to severity of illness   Dispo: The patient is from: Home              Anticipated d/c is to: Home to live with mother who is disabled secondary to rheumatoid arthritis.  Post discharge transportation will be an issue.  This patient lives in the [place] area but will need to come to [facility] for the Suboxone clinic as well as for ID clinic and given this we will also arrange for at least 1 PCP appointment after discharge in the [place] area.              Patient currently is not medically stable to d/c.   Difficult to place patient Yes   Level of care: Med-Surg  Code Status: Full Family Communication: Patient DVT prophylaxis: Lovenox Vaccination status: Unknown   HPI: 30 year old male with past medical history of daily IV drug abuse with heroin and has used Suboxone from the streets.  He also has asthma, morbid obesity with a BMI of 52, daily smoker, suspected sleep apnea.  He presented to [facility] on 4/24 with back pain, right flank pain and shortness of breath x1 week.  CT of the chest consistent with large right empyema and he was admitted by the hospitalist team.  Subsequently taken to the OR on 4/26 for VATS and drainage of right empyema but due to severity of disease thoracotomy was completed instead.  He was a difficult intubation prior to the procedure and was unable to be weaned from the ventilator postoperatively.  There were also concerns of possible difficulty with appropriate oxygenation during the procedure.  He was subsequently admitted to the ICU under the care of PCCM.  Eventually extubated on 4/27 and transferred out of ICU on 4/28.  Posterior chest tube removed 4/29.   Intraoperative cultures grew MRSA.  Antibiotics have been de-escalated to vancomycin.  ID consulted on 4/30.  MRI of thoracic spine was done and demonstrated T11/T12 discitis and possible abscess.  Neurosurgery was consulted and he subsequently underwent decompressive thoracic laminectomy on 5/2.  Incidental finding of hep C positive antibodies.   Subjective: .  Seems sad, angry and frustrated.  Only noted responses to questions asked.  Had no complaints or requests.  Objective: Vitals:   11/07/20 2331 11/08/20 0825  BP: (!) 145/82 121/78  Pulse: 85 86  Resp: 17 18  Temp:  98.1 F (36.7 C)  SpO2: 97% 97%   No intake or output data in the 24 hours ending 11/08/20 0827 Filed Weights   10/07/20 1006  Weight: (!) 146.7 kg    Exam:  Constitutional: Awake, appears frustrated.  Appears to be comfortable otherwise Respiratory: Lungs are clear, stable on room air.  Supine in bed in no increased work of breathing. Cardiovascular: S1-S2, regular pulse, no peripheral edema. Abdomen: LBM 5/26, obese, soft nontender nondistended with normoactive bowel sounds.  Eating well. Neurologic: CN 2-12 grossly intact. Sensation intact, Strength 5/5 x all 4 extremities.  Ambulates with rolling walker. Psychiatric: Awake, oriented x3.  Appears angry and frustrated.   Assessment/Plan: Acute problems: Acute respiratory failure with hypercarbia and hypoxia (Resolved)/Right MRSA empyema s/p right thoracotomy on 10/09/2020 Continue vancomycin -ID recommends 6 weeks from 5/2 with last dose due on 11/27/2020.  ID to be consulted again when he is approaching end date of antibiotics for reevaluation for transition to oral antibiotics +/- reimaging.    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   Follow-up lumbar MRI w/o evidence of infection.   NS recommends OP follow-up in 2 weeks  -attending on 5/25 contacted neurosurgery to see if they could proceed with reevaluation while here since patient will otherwise miss outpatient appointment due to requirement to remain in the hospital until 6/14 Continue Suboxone,Tylenol, naproxen with p.o. Toradol for breakthrough pain, topical lidocaine, Robaxin, bedtime Lyrica  Essential hypertension New diagnosis- continue Norvasc   Polysubstance abuse/IVDU Continue Suboxone. Plan is to continue after discharge and follow-up at internal medicine Suboxone clinic/Dr. Oswaldo Done  Physical deconditioning Improving and able to ambulate at times for short distances without rolling walker-HHPT  Morbid obesity BMI 52.2 kg/m  Follow-up with PCP regarding weight management strategies  Other problems: Positive hepatitis C antibody No known previous history of hepatitis C infection.  Antibodies positive.  ID will follow up HCV RNA and have made a follow-up appointment at [facility] for HCV treatment on 6/21 at 8:45 AM.  Anxiety Continue as needed hydroxyzine.  Normocytic anemia/Iron deficiency anemia Hemoglobin is stable over 9.   Continue PO iron  Tobacco dependence Smoking cessation counseling provided.   Data Reviewed: Basic Metabolic Panel: Recent Labs  Lab 11/02/20 0500 11/06/20 0625  NA 136 138  K 4.5 4.5  CL 103 105  CO2 28 29  GLUCOSE 98 114*  BUN 16 14  CREATININE 0.75 0.76  CALCIUM 8.7* 8.7*   Liver Function Tests: No results for input(s): AST, ALT, ALKPHOS, BILITOT, PROT, ALBUMIN in the last 168 hours.   CBC: Recent Labs  Lab 11/06/20 0625  WBC 3.3*  HGB 9.7*  HCT 31.3*  MCV 94.8  PLT 203     Scheduled Meds: . acetaminophen  1,000 mg Oral TID  . amLODipine  5 mg Oral Daily  . buprenorphine-naloxone  2 tablet Sublingual BID  . Chlorhexidine Gluconate Cloth  6 each Topical Daily  . docusate sodium  100 mg Oral BID  . enoxaparin (LOVENOX) injection  70 mg Subcutaneous Q24H  . ferrous sulfate  325 mg  Oral BID WC  . lidocaine  1 patch Transdermal Q24H  . mouth rinse  15 mL Mouth Rinse BID  . naproxen  250 mg Oral BID WC  . nicotine  14 mg Transdermal Daily  . pantoprazole  40 mg Oral QHS  . polyethylene glycol  17 g Oral Daily  . pregabalin  50 mg Oral QHS  . senna-docusate  2 tablet Oral QHS  . sodium chloride flush  10-40 mL Intracatheter Q12H  . sodium chloride flush  3 mL Intravenous Q12H  . sodium chloride flush  3 mL Intravenous Q12H   Continuous Infusions: . sodium chloride Stopped (10/16/20 1455)  . 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  S/p decompressive thoracic laminectomy T11-12 on 10/15/2020   Antibiotics:  Vancomycin 4/24 >>  Maxipime 4/24 through 4/28  Ancef 5/2 through 5/3   Time spent: 35    Junious Silk ANP  [facility] 7 am - 330 pm/M-F for direct patient care and secure chat Please refer to Amion for contact info 32  days

## 2020-11-08 NOTE — Progress Notes (Signed)
Was asked to assess patients postop wound. It is healing nicely. I removed the dressing. No drainage or erythema. Can follow up with [place] in the office when he is discharged.

## 2020-11-09 NOTE — Progress Notes (Signed)
[facility] PROGRESS NOTE  Derek Woods PXT:[id number] DOB: August 21, 1990 DOA: 10/07/2020 PCP: Pcp, No  Status: Remains inpatient appropriate because:Unsafe d/c plan, IV treatments appropriate due to intensity of illness or inability to take PO and Inpatient level of care appropriate due to severity of illness   Dispo: The patient is from: Home              Anticipated d/c is to: Home to live with mother who is disabled secondary to rheumatoid arthritis.  Post discharge transportation will be an issue.  This patient lives in the [place] area but will need to come to [facility] for the Suboxone clinic as well as for ID clinic and given this we will also arrange for at least 1 PCP appointment after discharge in the [place] area.              Patient currently is not medically stable to d/c.   Difficult to place patient Yes   Level of care: Med-Surg  Code Status: Full Family Communication: Patient DVT prophylaxis: Lovenox Vaccination status: Unknown   HPI: 30 year old male with past medical history of daily IV drug abuse with heroin and has used Suboxone from the streets.  He also has asthma, morbid obesity with a BMI of 52, daily smoker, suspected sleep apnea.  He presented to [facility] on 4/24 with back pain, right flank pain and shortness of breath x1 week.  CT of the chest consistent with large right empyema and he was admitted by the hospitalist team.  Subsequently taken to the OR on 4/26 for VATS and drainage of right empyema but due to severity of disease thoracotomy was completed instead.  He was a difficult intubation prior to the procedure and was unable to be weaned from the ventilator postoperatively.  There were also concerns of possible difficulty with appropriate oxygenation during the procedure.  He was subsequently admitted to the ICU under the care of PCCM.  Eventually extubated on 4/27 and transferred out of ICU on 4/28.  Posterior chest tube removed 4/29.   Intraoperative cultures grew MRSA.  Antibiotics have been de-escalated to vancomycin.  ID consulted on 4/30.  MRI of thoracic spine was done and demonstrated T11/T12 discitis and possible abscess.  Neurosurgery was consulted and he subsequently underwent decompressive thoracic laminectomy on 5/2.  Incidental finding of hep C positive antibodies.   Subjective: Awake.  In better spirits today.  Discussing movies.  Objective: Vitals:   11/08/20 2017 11/09/20 0007  BP: (!) 153/89 136/87  Pulse: 97 91  Resp: 20 16  Temp: 98.4 F (36.9 C) 98.7 F (37.1 C)  SpO2: 98% 98%   No intake or output data in the 24 hours ending 11/09/20 6270 Filed Weights   10/07/20 1006  Weight: (!) 146.7 kg    Exam:  Constitutional: Alert, calm, appears to be comfortable without specific complaints Respiratory: Anterior lung sounds are clear to auscultation and he remained stable on room air Cardiovascular: Normal heart sounds, no peripheral edema, regular nontachycardic pulse Abdomen: LBM 5/26, obese, soft, nontender and nondistended with normoactive bowel sounds Neurologic: CN 2-12 grossly intact. Sensation intact, Strength 5/5 x all 4 extremities.  Ambulates with rolling walker. Psychiatric: Awake, oriented x3.   Assessment/Plan: Acute problems: Acute respiratory failure with hypercarbia and hypoxia (Resolved)/Right MRSA empyema s/p right thoracotomy on 10/09/2020 Continue vancomycin -ID recommends 6 weeks from 5/2 with last dose due on 11/27/2020.  ID to be consulted again when he is approaching end date of antibiotics for  reevaluation for transition to oral antibiotics +/- reimaging.    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   Follow-up lumbar MRI w/o evidence of infection.   Wound reassessed by neurosurgical team on 5/26.  Recommendations are to follow-up in their  office after discharge Continue Suboxone,Tylenol, naproxen with p.o. Toradol for breakthrough pain, topical lidocaine, Robaxin, bedtime Lyrica  Essential hypertension New diagnosis- continue Norvasc   Polysubstance abuse/IVDU Continue Suboxone. Plan is to continue after discharge and follow-up at internal medicine Suboxone clinic/Dr. Oswaldo Done  Physical deconditioning Improving and able to ambulate at times for short distances without rolling walker-HHPT  Morbid obesity BMI 52.2 kg/m  Follow-up with PCP regarding weight management strategies  Other problems: Positive hepatitis C antibody No known previous history of hepatitis C infection.  Antibodies positive.  ID will follow up HCV RNA and have made a follow-up appointment at [facility] for HCV treatment on 6/21 at 8:45 AM.  Anxiety Continue as needed hydroxyzine.  Normocytic anemia/Iron deficiency anemia Hemoglobin is stable over 9.   Continue PO iron  Tobacco dependence Smoking cessation counseling provided.   Data Reviewed: Basic Metabolic Panel: Recent Labs  Lab 11/06/20 0625  NA 138  K 4.5  CL 105  CO2 29  GLUCOSE 114*  BUN 14  CREATININE 0.76  CALCIUM 8.7*   Liver Function Tests: No results for input(s): AST, ALT, ALKPHOS, BILITOT, PROT, ALBUMIN in the last 168 hours.   CBC: Recent Labs  Lab 11/06/20 0625  WBC 3.3*  HGB 9.7*  HCT 31.3*  MCV 94.8  PLT 203     Scheduled Meds: . acetaminophen  1,000 mg Oral TID  . amLODipine  5 mg Oral Daily  . buprenorphine-naloxone  2 tablet Sublingual BID  . Chlorhexidine Gluconate Cloth  6 each Topical Daily  . docusate sodium  100 mg Oral BID  . enoxaparin (LOVENOX) injection  70 mg Subcutaneous Q24H  . ferrous sulfate  325 mg Oral BID WC  . lidocaine  1 patch Transdermal Q24H  . mouth rinse  15 mL Mouth Rinse BID  . naproxen  250 mg Oral BID WC  . nicotine  14 mg Transdermal Daily  . pantoprazole  40 mg Oral QHS  . polyethylene glycol  17 g Oral Daily   . pregabalin  50 mg Oral QHS  . senna-docusate  2 tablet Oral QHS  . sodium chloride flush  10-40 mL Intracatheter Q12H  . sodium chloride flush  3 mL Intravenous Q12H  . sodium chloride flush  3 mL Intravenous Q12H   Continuous Infusions: . sodium chloride Stopped (10/16/20 1455)  . vancomycin 1,000 mg (11/08/20 2123)    Principal Problem:   Empyema lung (HCC) Active Problems:   Polysubstance dependence including opioid type drug with complication, episodic abuse (HCC)   Morbid obesity with BMI of 50.0-59.9, adult (HCC)   Tobacco dependence   MRSA infection   Abscess in epidural space of thoracic spine   Consultants:  CT surgery  ID  Neurosurgery   Procedures:  Chest tube placement, thoracotomy and intubation  Right upper arm PICC line.  S/p decompressive thoracic laminectomy T11-12 on 10/15/2020   Antibiotics:  Vancomycin 4/24 >>  Maxipime 4/24 through 4/28  Ancef 5/2 through 5/3   Time spent: 35    Junious Silk ANP  [facility] 7 am - 330 pm/M-F for direct patient care and secure chat Please refer to Amion for contact  info 33  days

## 2020-11-10 MED ORDER — LIDOCAINE VISCOUS HCL 2 % MT SOLN
15.0000 mL | OROMUCOSAL | Status: DC | PRN
  Administered 2020-11-11: 15 mL via OROMUCOSAL
  Filled 2020-11-10 (×3): qty 15

## 2020-11-10 MED ORDER — BENZOCAINE 10 % MT GEL
Freq: Four times a day (QID) | OROMUCOSAL | Status: DC | PRN
  Filled 2020-11-10: qty 9

## 2020-11-10 NOTE — Progress Notes (Signed)
PROGRESS NOTE    Derek Woods  RDE:[id number] DOB: Apr 30, 1991 DOA: 10/07/2020 PCP: Pcp, No   Brief Narrative:  HPI On 10/07/2020 by Dr. Jonah Blue Derek Woods is a 30 y.o. male with medical history significant of asthma; anxiety; morbid obesity; and polysubstance abuse presenting with R flank pain and back pain.  He noticed onset of back pain, breathing difficulty worse than his usual asthma.  It is hard to stand and move, hard to do anything.  Symptoms have been present for a couple of days with SOB, but he has had back pain for about 3 weeks.  No fevers.  No sick contacts.  Last heroin use was yesterday AM.  He has been on Subutex for a few months and did well until he broke his foot and couldn't make it to his drug tests.  He would like to start back on Suboxone.    He reports that he has a skin condition causing diffuse pustules with scarring.  He can't remember the name of it.  He previously had PNA and pleural effusion in January; per [facility] ER record, it was aspiration PNA after heroin OD.   Interim history Patient was admitted for empyema noted on CT of the chest.  Was taken to the OR on 4/26 for VATS and drainage of the right empyema but due to severity of disease, thoracotomy was completed instead.  Patient was a difficult intubation prior to the procedure and was able to be weaned off from the ventilator postoperatively.  Patient was admitted to the ICU under the care of PCCM and was extubated on 4/27 and transferred out to the ICU on 4/28.  Chest tube was removed on 4/29.  Intraoperative cultures grew MRSA and antibiotics were de-escalated to vancomycin.  Infectious disease consulted on 4/30.  MRI of the thoracic spine showed T11/T12 discitis and possible abscess.  Neurosurgery consulted and patient underwent decompressive thoracic laminectomy on 5/12.  Patient noted to also have incidental finding of hepatitis C positive antibodies.  Patient will need to continue IV antibiotics through  11/27/2020. Assessment & Plan   Acute respiratory failure with hypercarbia and hypoxia secondary to right MRSA empyema -Status post thoracotomy on 4/26 2022. -Cultures grew MRSA -Currently on IV antibiotics per recommendations of infectious disease, last dose on 11/27/2020. 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  Patient can follow-up in the office after discharge.  Suspected OSA/OHS -Patient will need formal testing after discharge  Essential hypertension -Continue amlodipine, as needed hydralazine  Polysubstance abuse/IVDU -Continue Suboxone, Naprosyn, lidocaine patch -Patient will need follow-up with Dr. Oswaldo Done, internal medicine clinic after discharge  Physical deconditioning -PT working with patient recommending home health  Morbid obesity -BMI 52 -Patient wanting to follow-up with PCP to discuss lifestyle modifications  Hepatitis C antibody positive -No known previous history of hepatitis C infection -ID will follow up with patient on 6/21 at 8:45 AM  Anxiety -Continue hydroxyzine as needed  Normocytic anemia/iron deficiency anemia -Continue supplemental iron replacement -Last hemoglobin 9.7  Tobacco dependence -Smoking cessation discussed  Throat irritation -  Informed by nurse that patient is complaining of throat and mouth irritation -Continue Cepacol and Chloraseptic spray -Will add on Orajel and left viscous  lidocaine  DVT Prophylaxis  lovenox  Code Status: Full  Family Communication: None at bedside  Disposition Plan:  Status is: Inpatient  Remains inpatient appropriate because:IV treatments appropriate due to intensity of illness or inability to take PO   Dispo: The patient is from: Home              Anticipated d/c is to: Home              Patient currently is not medically stable to d/c.   Difficult to place patient No   Consultants Cardiothoracic surgery Infectious disease Neurosurgery  Procedures  Chest tube placement, thoracotomy and intubation RUE PICC line placement Decompressive thoracic laminectomy T11-12  Antibiotics   Anti-infectives (From admission, onward)   Start     Dose/Rate Route Frequency Ordered Stop   10/18/20 1000  vancomycin (VANCOREADY) IVPB 1000 mg/200 mL        1,000 mg 200 mL/hr over 60 Minutes Intravenous Every 12 hours 10/17/20 1601 11/25/20 2359   10/15/20 1830  ceFAZolin (ANCEF) IVPB 2g/100 mL premix        2 g 200 mL/hr over 30 Minutes Intravenous Every 8 hours 10/15/20 1744 10/16/20 0659   10/13/20 1200  vancomycin (VANCOREADY) IVPB 2000 mg/400 mL  Status:  Discontinued        2,000 mg 200 mL/hr over 120 Minutes Intravenous Every 24 hours 10/12/20 1115 10/17/20 1559   10/12/20 1215  vancomycin (VANCOREADY) IVPB 500 mg/100 mL        500 mg 100 mL/hr over 60 Minutes Intravenous  Once 10/12/20 1115 10/12/20 1329   10/12/20 0600  vancomycin (VANCOREADY) IVPB 1500 mg/300 mL  Status:  Discontinued        1,500 mg 150 mL/hr over 120 Minutes Intravenous Every 24 hours 10/12/20 0541 10/12/20 0556   10/12/20 0600  vancomycin (VANCOREADY) IVPB 1500 mg/300 mL  Status:  Discontinued        1,500 mg 150 mL/hr over 120 Minutes Intravenous Every 18 hours 10/12/20 0556 10/12/20 1115   10/11/20 1343  vancomycin variable dose per unstable renal function (pharmacist dosing)  Status:  Discontinued         Does not apply See admin instructions 10/11/20 1344  10/12/20 0556   10/10/20 0400  vancomycin (VANCOREADY) IVPB 1250 mg/250 mL  Status:  Discontinued        1,250 mg 166.7 mL/hr over 90 Minutes Intravenous Every 8 hours 10/09/20 1835 10/11/20 1344   10/09/20 2000  vancomycin (VANCOREADY) IVPB 1250 mg/250 mL  Status:  Discontinued        1,250 mg 166.7 mL/hr over 90 Minutes Intravenous Every 8 hours 10/09/20 1117 10/09/20 1835   10/09/20 1900  vancomycin (VANCOCIN) 2,500 mg in sodium chloride 0.9 % 500 mL IVPB        2,500 mg 250 mL/hr over 120 Minutes Intravenous  Once 10/09/20 1835 10/09/20 2137   10/09/20 1200  vancomycin (VANCOCIN) 2,500 mg in sodium chloride 0.9 % 500 mL IVPB  Status:  Discontinued        2,500 mg 250 mL/hr over 120 Minutes Intravenous  Once 10/09/20 1113 10/09/20 1835   10/07/20 2200  vancomycin (VANCOREADY) IVPB 2000 mg/400 mL  Status:  Discontinued        2,000 mg 200 mL/hr over 120 Minutes Intravenous Every 12 hours 10/07/20 1251 10/07/20  1437   10/07/20 2000  ceFEPIme (MAXIPIME) 2 g in sodium chloride 0.9 % 100 mL IVPB  Status:  Discontinued        2 g 200 mL/hr over 30 Minutes Intravenous Every 8 hours 10/07/20 1251 10/11/20 1342   10/07/20 1145  ceFEPIme (MAXIPIME) 2 g in sodium chloride 0.9 % 100 mL IVPB        2 g 200 mL/hr over 30 Minutes Intravenous  Once 10/07/20 1048 10/07/20 1351   10/07/20 1100  vancomycin (VANCOCIN) 2,500 mg in sodium chloride 0.9 % 500 mL IVPB  Status:  Discontinued        2,500 mg 250 mL/hr over 120 Minutes Intravenous  Once 10/07/20 1048 10/07/20 1437      Subjective:   Derek Woods seen and examined today.  Patient currently sleeping with earplugs in place.  Per nursing staff, patient later complained of throat irritation.  Objective:   Vitals:   11/09/20 1946 11/10/20 0014 11/10/20 0825 11/10/20 1158  BP:  (!) 147/78 123/77 (!) 146/100  Pulse: 91 85 88 92  Resp: 18 19 18 18   Temp:  98.4 F (36.9 C) 98.5 F (36.9 C) 98.9 F (37.2 C)  TempSrc: Oral Oral Oral Oral   SpO2: 96% 96% 95% 96%  Weight:      Height:        Intake/Output Summary (Last 24 hours) at 11/10/2020 1224 Last data filed at 11/09/2020 2300 Gross per 24 hour  Intake 840 ml  Output --  Net 840 ml   Filed Weights   10/07/20 1006  Weight: (!) 146.7 kg   Exam  General: Well developed, well nourished, NAD, appears stated age  HEENT: NCAT,mucous membranes moist.   Cardiovascular: S1 S2 auscultated,RRR  Respiratory: Clear to auscultation bilaterally, anteriorly  Abdomen: Soft, obese, nontender, nondistended, + bowel sounds  Extremities: warm dry without cyanosis clubbing or edema   Data Reviewed: I have personally reviewed following labs and imaging studies  CBC: Recent Labs  Lab 11/06/20 0625  WBC 3.3*  HGB 9.7*  HCT 31.3*  MCV 94.8  PLT 203   Basic Metabolic Panel: Recent Labs  Lab 11/06/20 0625  NA 138  K 4.5  CL 105  CO2 29  GLUCOSE 114*  BUN 14  CREATININE 0.76  CALCIUM 8.7*   GFR: Estimated Creatinine Clearance: 183.6 mL/min (by C-G formula based on SCr of 0.76 mg/dL). Liver Function Tests: No results for input(s): AST, ALT, ALKPHOS, BILITOT, PROT, ALBUMIN in the last 168 hours. No results for input(s): LIPASE, AMYLASE in the last 168 hours. No results for input(s): AMMONIA in the last 168 hours. Coagulation Profile: No results for input(s): INR, PROTIME in the last 168 hours. Cardiac Enzymes: No results for input(s): CKTOTAL, CKMB, CKMBINDEX, TROPONINI in the last 168 hours. BNP (last 3 results) No results for input(s): PROBNP in the last 8760 hours. HbA1C: No results for input(s): HGBA1C in the last 72 hours. CBG: No results for input(s): GLUCAP in the last 168 hours. Lipid Profile: No results for input(s): CHOL, HDL, LDLCALC, TRIG, CHOLHDL, LDLDIRECT in the last 72 hours. Thyroid Function Tests: No results for input(s): TSH, T4TOTAL, FREET4, T3FREE, THYROIDAB in the last 72 hours. Anemia Panel: No results for input(s): VITAMINB12,  FOLATE, FERRITIN, TIBC, IRON, RETICCTPCT in the last 72 hours. Urine analysis: No results found for: COLORURINE, APPEARANCEUR, LABSPEC, PHURINE, GLUCOSEU, HGBUR, BILIRUBINUR, KETONESUR, PROTEINUR, UROBILINOGEN, NITRITE, LEUKOCYTESUR Sepsis Labs: @LABRCNTIP (procalcitonin:4,lacticidven:4)  )No results found for this or any previous visit (from  the past 240 hour(s)).    Radiology Studies: No results found.   Scheduled Meds: . acetaminophen  1,000 mg Oral TID  . amLODipine  5 mg Oral Daily  . buprenorphine-naloxone  2 tablet Sublingual BID  . Chlorhexidine Gluconate Cloth  6 each Topical Daily  . docusate sodium  100 mg Oral BID  . enoxaparin (LOVENOX) injection  70 mg Subcutaneous Q24H  . ferrous sulfate  325 mg Oral BID WC  . lidocaine  1 patch Transdermal Q24H  . mouth rinse  15 mL Mouth Rinse BID  . naproxen  250 mg Oral BID WC  . nicotine  14 mg Transdermal Daily  . pantoprazole  40 mg Oral QHS  . polyethylene glycol  17 g Oral Daily  . pregabalin  50 mg Oral QHS  . senna-docusate  2 tablet Oral QHS  . sodium chloride flush  10-40 mL Intracatheter Q12H  . sodium chloride flush  3 mL Intravenous Q12H  . sodium chloride flush  3 mL Intravenous Q12H   Continuous Infusions: . sodium chloride Stopped (10/16/20 1455)  . vancomycin 1,000 mg (11/10/20 0945)     LOS: 34 days   Time Spent in minutes   30 minutes  Jeroline Wolbert D.O. on 11/10/2020 at 12:24 PM  Between 7am to 7pm - Please see pager noted on [email or web address]  After 7pm go to [email or web address]  And look for the night coverage person covering for me after hours  [facility] Office  [phone]

## 2020-11-11 MED ORDER — BIOTENE DRY MOUTH MT LIQD: 15.0000 mL | OROMUCOSAL | Status: DC | PRN

## 2020-11-11 NOTE — Progress Notes (Signed)
PROGRESS NOTE    Derek Woods  [name]:[id number] DOB: 12-24-1990 DOA: 10/07/2020 PCP: Pcp, No   Brief Narrative:  HPI On 10/07/2020 by Dr. Jonah Blue Birt Derek Woods is a 30 y.o. male with medical history significant of asthma; anxiety; morbid obesity; and polysubstance abuse presenting with R flank pain and back pain.  He noticed onset of back pain, breathing difficulty worse than his usual asthma.  It is hard to stand and move, hard to do anything.  Symptoms have been present for a couple of days with SOB, but he has had back pain for about 3 weeks.  No fevers.  No sick contacts.  Last heroin use was yesterday AM.  He has been on Subutex for a few months and did well until he broke his foot and couldn't make it to his drug tests.  He would like to start back on Suboxone.    He reports that he has a skin condition causing diffuse pustules with scarring.  He can't remember the name of it.  He previously had PNA and pleural effusion in January; per [facility] ER record, it was aspiration PNA after heroin OD.   Interim history Patient was admitted for empyema noted on CT of the chest.  Was taken to the OR on 4/26 for VATS and drainage of the right empyema but due to severity of disease, thoracotomy was completed instead.  Patient was a difficult intubation prior to the procedure and was able to be weaned off from the ventilator postoperatively.  Patient was admitted to the ICU under the care of PCCM and was extubated on 4/27 and transferred out to the ICU on 4/28.  Chest tube was removed on 4/29.  Intraoperative cultures grew MRSA and antibiotics were de-escalated to vancomycin.  Infectious disease consulted on 4/30.  MRI of the thoracic spine showed T11/T12 discitis and possible abscess.  Neurosurgery consulted and patient underwent decompressive thoracic laminectomy on 5/12.  Patient noted to also have incidental finding of hepatitis C positive antibodies.  Patient will need to continue IV antibiotics through  11/27/2020. Assessment & Plan   Acute respiratory failure with hypercarbia and hypoxia secondary to right MRSA empyema -Status post thoracotomy on 10/09/2020. -Cultures grew MRSA -Currently on IV antibiotics per recommendations of infectious disease, last dose on 11/27/2020. 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  Patient can follow-up in the office after discharge.  Suspected OSA/OHS -Patient will need formal testing after discharge  Essential hypertension -Continue amlodipine, as needed hydralazine  Polysubstance abuse/IVDU -Continue Suboxone, Naprosyn, lidocaine patch -Patient will need follow-up with Dr. Oswaldo Done, internal medicine clinic after discharge  Physical deconditioning -PT/OT working with patient recommending home health  Morbid obesity -BMI 52 -Patient wanting to follow-up with PCP to discuss lifestyle modifications  Hepatitis C antibody positive -No known previous history of hepatitis C infection -ID will follow up with patient on 6/21 at 8:45 AM  Anxiety -Continue hydroxyzine as needed  Normocytic anemia/iron deficiency anemia -Continue supplemental iron replacement -Last hemoglobin 9.7  Tobacco dependence -Smoking cessation discussed  Throat irritation -Informed  by nurse that patient is complaining of throat and mouth irritation -Continue Cepacol and Chloraseptic spray -Added left viscous lidocaine and  orajel -on examination, no thrush or redness noted in the oropharynx -patient feels his tongue and entire mouth are sore -Will order biotene  DVT Prophylaxis  lovenox  Code Status: Full  Family Communication: None at bedside  Disposition Plan:  Status is: Inpatient  Remains inpatient appropriate because:IV treatments appropriate due to intensity of illness or inability to take PO   Dispo: The patient is from: Home              Anticipated d/c is to: Home              Patient currently is not medically stable to d/c.   Difficult to place patient No   Consultants Cardiothoracic surgery Infectious disease Neurosurgery  Procedures  Chest tube placement, thoracotomy and intubation RUE PICC line placement Decompressive thoracic laminectomy T11-12  Antibiotics   Anti-infectives (From admission, onward)   Start     Dose/Rate Route Frequency Ordered Stop   10/18/20 1000  vancomycin (VANCOREADY) IVPB 1000 mg/200 mL        1,000 mg 200 mL/hr over 60 Minutes Intravenous Every 12 hours 10/17/20 1601 11/25/20 2359   10/15/20 1830  ceFAZolin (ANCEF) IVPB 2g/100 mL premix        2 g 200 mL/hr over 30 Minutes Intravenous Every 8 hours 10/15/20 1744 10/16/20 0659   10/13/20 1200  vancomycin (VANCOREADY) IVPB 2000 mg/400 mL  Status:  Discontinued        2,000 mg 200 mL/hr over 120 Minutes Intravenous Every 24 hours 10/12/20 1115 10/17/20 1559   10/12/20 1215  vancomycin (VANCOREADY) IVPB 500 mg/100 mL        500 mg 100 mL/hr over 60 Minutes Intravenous  Once 10/12/20 1115 10/12/20 1329   10/12/20 0600  vancomycin (VANCOREADY) IVPB 1500 mg/300 mL  Status:  Discontinued        1,500 mg 150 mL/hr over 120 Minutes Intravenous Every 24 hours 10/12/20 0541 10/12/20 0556   10/12/20 0600  vancomycin (VANCOREADY) IVPB 1500 mg/300 mL  Status:  Discontinued        1,500 mg 150 mL/hr over 120 Minutes Intravenous Every 18 hours 10/12/20 0556 10/12/20 1115   10/11/20 1343  vancomycin variable  dose per unstable renal function (pharmacist dosing)  Status:  Discontinued         Does not apply See admin instructions 10/11/20 1344 10/12/20 0556   10/10/20 0400  vancomycin (VANCOREADY) IVPB 1250 mg/250 mL  Status:  Discontinued        1,250 mg 166.7 mL/hr over 90 Minutes Intravenous Every 8 hours 10/09/20 1835 10/11/20 1344   10/09/20 2000  vancomycin (VANCOREADY) IVPB 1250 mg/250 mL  Status:  Discontinued        1,250 mg 166.7 mL/hr over 90 Minutes Intravenous Every 8 hours 10/09/20 1117 10/09/20 1835   10/09/20 1900  vancomycin (VANCOCIN) 2,500 mg in sodium chloride 0.9 % 500 mL IVPB        2,500 mg 250 mL/hr over 120 Minutes Intravenous  Once 10/09/20 1835 10/09/20 2137   10/09/20 1200  vancomycin (VANCOCIN) 2,500 mg in sodium chloride 0.9 % 500 mL IVPB  Status:  Discontinued        2,500 mg 250 mL/hr over 120 Minutes Intravenous  Once 10/09/20 1113 10/09/20 1835   10/07/20 2200  vancomycin (VANCOREADY) IVPB 2000 mg/400 mL  Status:  Discontinued  2,000 mg 200 mL/hr over 120 Minutes Intravenous Every 12 hours 10/07/20 1251 10/07/20 1437   10/07/20 2000  ceFEPIme (MAXIPIME) 2 g in sodium chloride 0.9 % 100 mL IVPB  Status:  Discontinued        2 g 200 mL/hr over 30 Minutes Intravenous Every 8 hours 10/07/20 1251 10/11/20 1342   10/07/20 1145  ceFEPIme (MAXIPIME) 2 g in sodium chloride 0.9 % 100 mL IVPB        2 g 200 mL/hr over 30 Minutes Intravenous  Once 10/07/20 1048 10/07/20 1351   10/07/20 1100  vancomycin (VANCOCIN) 2,500 mg in sodium chloride 0.9 % 500 mL IVPB  Status:  Discontinued        2,500 mg 250 mL/hr over 120 Minutes Intravenous  Once 10/07/20 1048 10/07/20 1437      Subjective:   Derek Woods seen and examined today.  Patient complains of dry mouth and throat.  Denies current chest pain or shortness of breath, abdominal pain, nausea or vomiting, dizziness or headache.   Objective:   Vitals:   11/10/20 1614 11/10/20 2023 11/10/20 2346 11/11/20 0937   BP: 132/72 (!) 137/92 134/81 (!) 133/91  Pulse: 95 93 94 85  Resp: 17 18 19 18   Temp: 98.5 F (36.9 C) 98.7 F (37.1 C) 98.5 F (36.9 C) 98.2 F (36.8 C)  TempSrc: Oral Oral Oral Oral  SpO2: 97% 100% 96% 97%  Weight:      Height:        Intake/Output Summary (Last 24 hours) at 11/11/2020 1156 Last data filed at 11/10/2020 2147 Gross per 24 hour  Intake 1330 ml  Output --  Net 1330 ml   Filed Weights   10/07/20 1006  Weight: (!) 146.7 kg   Exam  General: Well developed, well nourished, NAD, appears stated age  HEENT: NCAT, mucous membranes moist.   Cardiovascular: S1 S2 auscultated, RRR.  Respiratory: Clear to auscultation bilaterally, anteriorly  Abdomen: Soft, obese, nontender, nondistended, + bowel sounds  Extremities: warm dry without cyanosis clubbing or edema  Neuro: AAOx3, nonfocal  Psych: appropriate mood and affect, pleasant  Data Reviewed: I have personally reviewed following labs and imaging studies  CBC: Recent Labs  Lab 11/06/20 0625  WBC 3.3*  HGB 9.7*  HCT 31.3*  MCV 94.8  PLT 203   Basic Metabolic Panel: Recent Labs  Lab 11/06/20 0625  NA 138  K 4.5  CL 105  CO2 29  GLUCOSE 114*  BUN 14  CREATININE 0.76  CALCIUM 8.7*   GFR: Estimated Creatinine Clearance: 183.6 mL/min (by C-G formula based on SCr of 0.76 mg/dL). Liver Function Tests: No results for input(s): AST, ALT, ALKPHOS, BILITOT, PROT, ALBUMIN in the last 168 hours. No results for input(s): LIPASE, AMYLASE in the last 168 hours. No results for input(s): AMMONIA in the last 168 hours. Coagulation Profile: No results for input(s): INR, PROTIME in the last 168 hours. Cardiac Enzymes: No results for input(s): CKTOTAL, CKMB, CKMBINDEX, TROPONINI in the last 168 hours. BNP (last 3 results) No results for input(s): PROBNP in the last 8760 hours. HbA1C: No results for input(s): HGBA1C in the last 72 hours. CBG: No results for input(s): GLUCAP in the last 168 hours. Lipid  Profile: No results for input(s): CHOL, HDL, LDLCALC, TRIG, CHOLHDL, LDLDIRECT in the last 72 hours. Thyroid Function Tests: No results for input(s): TSH, T4TOTAL, FREET4, T3FREE, THYROIDAB in the last 72 hours. Anemia Panel: No results for input(s): VITAMINB12, FOLATE, FERRITIN, TIBC, IRON, RETICCTPCT  in the last 72 hours. Urine analysis: No results found for: COLORURINE, APPEARANCEUR, LABSPEC, PHURINE, GLUCOSEU, HGBUR, BILIRUBINUR, KETONESUR, PROTEINUR, UROBILINOGEN, NITRITE, LEUKOCYTESUR Sepsis Labs: @LABRCNTIP (procalcitonin:4,lacticidven:4)  )No results found for this or any previous visit (from the past 240 hour(s)).    Radiology Studies: No results found.   Scheduled Meds: . acetaminophen  1,000 mg Oral TID  . amLODipine  5 mg Oral Daily  . buprenorphine-naloxone  2 tablet Sublingual BID  . Chlorhexidine Gluconate Cloth  6 each Topical Daily  . docusate sodium  100 mg Oral BID  . enoxaparin (LOVENOX) injection  70 mg Subcutaneous Q24H  . ferrous sulfate  325 mg Oral BID WC  . lidocaine  1 patch Transdermal Q24H  . mouth rinse  15 mL Mouth Rinse BID  . naproxen  250 mg Oral BID WC  . nicotine  14 mg Transdermal Daily  . pantoprazole  40 mg Oral QHS  . polyethylene glycol  17 g Oral Daily  . pregabalin  50 mg Oral QHS  . senna-docusate  2 tablet Oral QHS  . sodium chloride flush  10-40 mL Intracatheter Q12H  . sodium chloride flush  3 mL Intravenous Q12H   Continuous Infusions: . sodium chloride Stopped (10/16/20 1455)  . vancomycin 1,000 mg (11/11/20 0920)     LOS: 35 days   Time Spent in minutes   30 minutes  Espen Bethel D.O. on 11/11/2020 at 11:56 AM  Between 7am to 7pm - Please see pager noted on [email or web address]  After 7pm go to [email or web address]  And look for the night coverage person covering for me after hours  [facility] Office  [phone]

## 2020-11-12 NOTE — Progress Notes (Signed)
[facility] PROGRESS NOTE  Fedor Kazmierski FAO:[id number] DOB: February 04, 1991 DOA: 10/07/2020 PCP: Pcp, No  Status: Remains inpatient appropriate because:Unsafe d/c plan, IV treatments appropriate due to intensity of illness or inability to take PO and Inpatient level of care appropriate due to severity of illness   Dispo: The patient is from: Home              Anticipated d/c is to: Home to live with mother who is disabled secondary to rheumatoid arthritis.  Post discharge transportation will be an issue.  This patient lives in the [place] area but will need to come to [facility] for the Suboxone clinic as well as for ID clinic and given this we will also arrange for at least 1 PCP appointment after discharge in the [place] area.              Patient currently is not medically stable to d/c.   Difficult to place patient Yes   Level of care: Med-Surg  Code Status: Full Family Communication: Patient DVT prophylaxis: Lovenox Vaccination status: Unknown   HPI: 30 year old male with past medical history of daily IV drug abuse with heroin and has used Suboxone from the streets.  He also has asthma, morbid obesity with a BMI of 52, daily smoker, suspected sleep apnea.  He presented to [facility] on 4/24 with back pain, right flank pain and shortness of breath x1 week.  CT of the chest consistent with large right empyema and he was admitted by the hospitalist team.  Subsequently taken to the OR on 4/26 for VATS and drainage of right empyema but due to severity of disease thoracotomy was completed instead.  He was a difficult intubation prior to the procedure and was unable to be weaned from the ventilator postoperatively.  There were also concerns of possible difficulty with appropriate oxygenation during the procedure.  He was subsequently admitted to the ICU under the care of PCCM.  Eventually extubated on 4/27 and transferred out of ICU on 4/28.  Posterior chest tube removed 4/29.   Intraoperative cultures grew MRSA.  Antibiotics have been de-escalated to vancomycin.  ID consulted on 4/30.  MRI of thoracic spine was done and demonstrated T11/T12 discitis and possible abscess.  Neurosurgery was consulted and he subsequently underwent decompressive thoracic laminectomy on 5/2.  Incidental finding of hep C positive antibodies.   Subjective: Sleeping soundly and did not awaken for physical exam  Objective: Vitals:   11/11/20 2053 11/11/20 2331  BP:  135/80  Pulse:  93  Resp:  18  Temp:  98.4 F (36.9 C)  SpO2: 96% 97%   No intake or output data in the 24 hours ending 11/12/20 0803 Filed Weights   10/07/20 1006  Weight: (!) 146.7 kg    Exam:  Constitutional: Sleeping Respiratory: Clear to auscultation bilaterally.  Room air. Cardiovascular: S1-S2, regular pulse.  No peripheral edema. Abdomen: LBM 5/28, obese, soft nontender nondistended with normoactive bowel sounds Neurologic: CN 2-12 grossly intact. Sensation intact, Strength 5/5 x all 4 extremities.  Ambulates with rolling walker. Psychiatric: Being   Assessment/Plan: Acute problems: Acute respiratory failure with hypercarbia and hypoxia (Resolved)/Right MRSA empyema s/p right thoracotomy on 10/09/2020 Continue vancomycin -ID recommends 6 weeks from 5/2 with last dose due on 11/27/2020.  Call ID prior to last dose of IV antibiotics to determine if transition to oral antibiotics would be indicated   Suspected OSA/OHS Will need eventual formal testing after discharge  T11-12 discitis, osteomyelitis and epidural abscess/S/p decompressive  thoracic laminectomy 10/15/2020.   Follow-up lumbar MRI w/o evidence of infection.   Wound reassessed by neurosurgical team on 5/26.  Recommendations are to follow-up in their office after discharge Continue Suboxone,Tylenol, naproxen with p.o. Toradol for breakthrough pain, topical lidocaine, Robaxin, bedtime Lyrica  Essential hypertension New diagnosis- continue Norvasc    Polysubstance abuse/IVDU Continue Suboxone. Plan is to continue after discharge and follow-up at internal medicine Suboxone clinic/Dr. Oswaldo Done  Physical deconditioning Improving and able to ambulate at times for short distances without rolling walker-HHPT  Morbid obesity BMI 52.2 kg/m  Follow-up with PCP regarding weight management strategies  Other problems: Positive hepatitis C antibody No known previous history of hepatitis C infection.   Antibodies positive.   Has follow-up appointment scheduled with [facility]  Anxiety Continue as needed hydroxyzine.  Normocytic anemia/Iron deficiency anemia Hemoglobin is stable over 9.   Continue PO iron  Tobacco dependence Smoking cessation counseling provided.   Data Reviewed: Basic Metabolic Panel: Recent Labs  Lab 11/06/20 0625  NA 138  K 4.5  CL 105  CO2 29  GLUCOSE 114*  BUN 14  CREATININE 0.76  CALCIUM 8.7*   Liver Function Tests: No results for input(s): AST, ALT, ALKPHOS, BILITOT, PROT, ALBUMIN in the last 168 hours.   CBC: Recent Labs  Lab 11/06/20 0625  WBC 3.3*  HGB 9.7*  HCT 31.3*  MCV 94.8  PLT 203     Scheduled Meds: . acetaminophen  1,000 mg Oral TID  . amLODipine  5 mg Oral Daily  . buprenorphine-naloxone  2 tablet Sublingual BID  . Chlorhexidine Gluconate Cloth  6 each Topical Daily  . docusate sodium  100 mg Oral BID  . enoxaparin (LOVENOX) injection  70 mg Subcutaneous Q24H  . ferrous sulfate  325 mg Oral BID WC  . lidocaine  1 patch Transdermal Q24H  . mouth rinse  15 mL Mouth Rinse BID  . naproxen  250 mg Oral BID WC  . nicotine  14 mg Transdermal Daily  . pantoprazole  40 mg Oral QHS  . polyethylene glycol  17 g Oral Daily  . pregabalin  50 mg Oral QHS  . senna-docusate  2 tablet Oral QHS  . sodium chloride flush  10-40 mL Intracatheter Q12H  . sodium chloride flush  3 mL Intravenous Q12H   Continuous Infusions: . sodium chloride Stopped (10/16/20 1455)  . vancomycin 1,000  mg (11/11/20 2158)    Principal Problem:   Empyema lung (HCC) Active Problems:   Polysubstance dependence including opioid type drug with complication, episodic abuse (HCC)   Morbid obesity with BMI of 50.0-59.9, adult (HCC)   Tobacco dependence   MRSA infection   Abscess in epidural space of thoracic spine   Consultants:  CT surgery  ID  Neurosurgery   Procedures:  Chest tube placement, thoracotomy and intubation  Right upper arm PICC line.  S/p decompressive thoracic laminectomy T11-12 on 10/15/2020   Antibiotics:  Vancomycin 4/24 >>  Maxipime 4/24 through 4/28  Ancef 5/2 through 5/3   Time spent: 35    Junious Silk ANP  [facility] 7 am - 330 pm/M-F for direct patient care and secure chat Please refer to Amion for contact info 36  days

## 2020-11-12 NOTE — Progress Notes (Signed)
Physical Therapy Treatment Patient Details Name: Derek Woods MRN: [id number] DOB: 1991-04-18 Today's Date: 11/12/2020    History of Present Illness [name] 30 yo male who presented on 10/07/20 with shortness of breath and found to have large R empyema.  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  Medical hx includes daily heroin IVDA (previous suboxone use), asthma, morbid obesity, OSA, and smokes.    PT Comments    Patient taken outside to ambulate on various surfaces for increased balance challenge. Per Dr. Tyson Babinski, okay to take patient outside for therapy. Patient overall supervision for ambulation on uneven terrain with no AD. Patient with LOB x 1 once returned to room when throwing trash away but able to self correct. Patient with flat affect throughout session. D/c plan remains appropriate.    Follow Up Recommendations  Home health PT;Supervision for mobility/OOB     Equipment Recommendations  None recommended by PT    Recommendations for Other Services       Precautions / Restrictions Precautions Precautions: Back Precaution Booklet Issued: No Precaution Comments: reviewed back precautions, cueing required for maintaining precautions Restrictions Weight Bearing Restrictions: No    Mobility  Bed Mobility Overal bed mobility: Modified Independent                  Transfers Overall transfer level: Needs assistance Equipment used: None Transfers: Sit to/from Stand Sit to Stand: Supervision         General transfer comment: supervision for safety  Ambulation/Gait Ambulation/Gait assistance: Supervision Gait Distance (Feet): 250 Feet Assistive device: None Gait Pattern/deviations: Step-through pattern;Wide base of support;Decreased stride length Gait velocity:  decreased   General Gait Details: supervision for safety on uneven terrain outdoors   Stairs Stairs: Yes Stairs assistance: Supervision Stair Management: One rail Right;Step to pattern;Forwards Number of Stairs: 6 General stair comments: supervision for safety   Wheelchair Mobility    Modified Rankin (Stroke Patients Only)       Balance Overall balance assessment: Mild deficits observed, not formally tested                                          Cognition Arousal/Alertness: Awake/alert Behavior During Therapy: Flat affect Overall Cognitive Status: Within Functional Limits for tasks assessed                                 General Comments: flat affect throughout today. Not talkative. "I just want to be left alone"      Exercises      General Comments        Pertinent Vitals/Pain Pain Assessment: Faces Faces Pain Scale: Hurts a little bit Pain Location: back Pain Descriptors / Indicators: Guarding Pain Intervention(s): Monitored during session;Repositioned    Home Living                      Prior Function            PT Goals (current goals can now be found in the care plan section) Acute Rehab PT Goals Patient Stated Goal: home PT Goal Formulation: With patient Time For Goal Achievement: 11/13/20 Potential to  Achieve Goals: Good Progress towards PT goals: Progressing toward goals    Frequency    Min 3X/week      PT Plan Current plan remains appropriate    Co-evaluation              AM-PAC PT "6 Clicks" Mobility   Outcome Measure  Help needed turning from your back to your side while in a flat bed without using bedrails?: None Help needed moving from lying on your back to sitting on the side of a flat bed without using bedrails?: None Help needed moving to and from a bed to a chair (including a wheelchair)?: A Little Help needed standing up from a chair using your arms (e.g., wheelchair or  bedside chair)?: A Little Help needed to walk in hospital room?: A Little Help needed climbing 3-5 steps with a railing? : A Little 6 Click Score: 20    End of Session   Activity Tolerance: Patient tolerated treatment well Patient left: in bed;with call bell/phone within reach Nurse Communication: Mobility status PT Visit Diagnosis: Other abnormalities of gait and mobility (R26.89);Muscle weakness (generalized) (M62.81);Pain     Time: 1450-1530 PT Time Calculation (min) (ACUTE ONLY): 40 min  Charges:  $Gait Training: 8-22 mins $Therapeutic Activity: 23-37 mins                     Amante Fomby A. Dan Humphreys PT, DPT Acute Rehabilitation Services Pager [phone] Office [phone]    [name] 11/12/2020, 5:11 PM

## 2020-11-13 LAB — CBC
HCT: 32.9 % — ABNORMAL LOW (ref 39.0–52.0)
Hemoglobin: 10.4 g/dL — ABNORMAL LOW (ref 13.0–17.0)
MCH: 30.2 pg (ref 26.0–34.0)
MCHC: 31.6 g/dL (ref 30.0–36.0)
MCV: 95.6 fL (ref 80.0–100.0)
Platelets: 204 10*3/uL (ref 150–400)
RBC: 3.44 MIL/uL — ABNORMAL LOW (ref 4.22–5.81)
RDW: 14.3 % (ref 11.5–15.5)
WBC: 2.9 10*3/uL — ABNORMAL LOW (ref 4.0–10.5)
nRBC: 0 % (ref 0.0–0.2)

## 2020-11-13 LAB — BASIC METABOLIC PANEL
Anion gap: 8 (ref 5–15)
BUN: 19 mg/dL (ref 6–20)
CO2: 26 mmol/L (ref 22–32)
Calcium: 8.8 mg/dL — ABNORMAL LOW (ref 8.9–10.3)
Chloride: 102 mmol/L (ref 98–111)
Creatinine, Ser: 0.87 mg/dL (ref 0.61–1.24)
GFR, Estimated: >60 mL/min (ref >60)
Glucose, Bld: 166 mg/dL — ABNORMAL HIGH (ref 70–99)
Potassium: 4.4 mmol/L (ref 3.5–5.1)
Sodium: 136 mmol/L (ref 135–145)

## 2020-11-13 NOTE — Progress Notes (Signed)
PROGRESS NOTE    Derek Woods  ZTI:[id number] DOB: [birth year] DOA: 10/07/2020 Derek Woods: Derek Woods, No   Brief Narrative:  HPI On 10/07/2020 by Dr. Jonah Woods Derek Woods is a 30 y.o. male with medical history significant of asthma; anxiety; morbid obesity; and polysubstance abuse presenting with R flank pain and back pain.  He noticed onset of back pain, breathing difficulty worse than his usual asthma.  It is hard to stand and move, hard to do anything.  Symptoms have been present for a couple of days with SOB, but he has had back pain for about 3 weeks.  No fevers.  No sick contacts.  Last heroin use was yesterday AM.  He has been on Subutex for a few months and did well until he broke his foot and couldn't make it to his drug tests.  He would like to start back on Suboxone.    He reports that he has a skin condition causing diffuse pustules with scarring.  He can't remember the name of it.  He previously had PNA and pleural effusion in January; per Derek Woods ER record, it was aspiration PNA after heroin OD.   Interim history Patient was admitted for empyema noted on CT of the chest.  Was taken to the OR on 4/26 for VATS and drainage of the right empyema but due to severity of disease, thoracotomy was completed instead.  Patient was a difficult intubation prior to the procedure and was able to be weaned off from the ventilator postoperatively.  Patient was admitted to the ICU under the care of PCCM and was extubated on 4/27 and transferred out to the ICU on 4/28.  Chest tube was removed on 4/29.  Intraoperative cultures grew MRSA and antibiotics were de-escalated to vancomycin.  Infectious disease consulted on 4/30.  MRI of the thoracic spine showed T11/T12 discitis and possible abscess.  Neurosurgery consulted and patient underwent decompressive thoracic laminectomy on 5/12.  Patient noted to also have incidental finding of hepatitis C positive antibodies.  Patient will need to continue IV antibiotics through  11/27/2020. Assessment & Plan   Acute respiratory failure with hypercarbia and hypoxia secondary to right MRSA empyema -Status post thoracotomy on 10/09/2020. -Cultures grew MRSA -Currently on IV antibiotics per recommendations of infectious disease, last dose on 11/27/2020. 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  Patient can follow-up in the office after discharge.  Suspected OSA/OHS -Patient will need formal testing after discharge  Essential hypertension -Continue amlodipine, as needed hydralazine  Polysubstance abuse/IVDU -Continue Suboxone, Naprosyn, lidocaine patch -Patient will need follow-up with Dr. Oswaldo Woods, internal medicine clinic after discharge  Physical deconditioning -PT/OT continues to work with patient recommending home health  Morbid obesity -BMI 52 -Patient wanting to follow-up with Derek Woods to discuss lifestyle modifications  Hepatitis C antibody positive -No known previous history of hepatitis C infection -ID will follow up with patient on 6/21 at 8:45 AM  Anxiety -Continue hydroxyzine as needed  Normocytic anemia/iron deficiency anemia -Continue supplemental iron replacement -Last hemoglobin 10.4 (11/13/2020)  Tobacco dependence -Smoking cessation discussed  Throat irritation -Informed by nurse that patient is complaining of throat and mouth irritation -Continue Cepacol and Chloraseptic spray -Patient  states the viscous lidocaine did not offer much relief in his oral dryness and pain -Continue Orajel as needed, Biotene, and oral care -on examination, no thrush or redness noted in the oropharynx   DVT Prophylaxis  lovenox  Code Status: Full  Family Communication: None at bedside  Disposition Plan:  Status is: Inpatient  Remains inpatient appropriate because:IV treatments appropriate due to intensity of illness or inability to take PO   Dispo: The patient is from: Home              Anticipated d/c is to: Home              Patient currently is not medically stable to d/c.   Difficult to place patient No   Consultants Cardiothoracic surgery Infectious disease Neurosurgery  Procedures  Chest tube placement, thoracotomy and intubation RUE PICC line placement Decompressive thoracic laminectomy T11-12  Antibiotics   Anti-infectives (From admission, onward)   Start     Dose/Rate Route Frequency Ordered Stop   10/18/20 1000  vancomycin (VANCOREADY) IVPB 1000 mg/200 mL        1,000 mg 200 mL/hr over 60 Minutes Intravenous Every 12 hours 10/17/20 1601 11/25/20 2359   10/15/20 1830  ceFAZolin (ANCEF) IVPB 2g/100 mL premix        2 g 200 mL/hr over 30 Minutes Intravenous Every 8 hours 10/15/20 1744 10/16/20 0659   10/13/20 1200  vancomycin (VANCOREADY) IVPB 2000 mg/400 mL  Status:  Discontinued        2,000 mg 200 mL/hr over 120 Minutes Intravenous Every 24 hours 10/12/20 1115 10/17/20 1559   10/12/20 1215  vancomycin (VANCOREADY) IVPB 500 mg/100 mL        500 mg 100 mL/hr over 60 Minutes Intravenous  Once 10/12/20 1115 10/12/20 1329   10/12/20 0600  vancomycin (VANCOREADY) IVPB 1500 mg/300 mL  Status:  Discontinued        1,500 mg 150 mL/hr over 120 Minutes Intravenous Every 24 hours 10/12/20 0541 10/12/20 0556   10/12/20 0600  vancomycin (VANCOREADY) IVPB 1500 mg/300 mL  Status:  Discontinued        1,500 mg 150 mL/hr over 120 Minutes Intravenous Every 18 hours 10/12/20  0556 10/12/20 1115   10/11/20 1343  vancomycin variable dose per unstable renal function (pharmacist dosing)  Status:  Discontinued         Does not apply See admin instructions 10/11/20 1344 10/12/20 0556   10/10/20 0400  vancomycin (VANCOREADY) IVPB 1250 mg/250 mL  Status:  Discontinued        1,250 mg 166.7 mL/hr over 90 Minutes Intravenous Every 8 hours 10/09/20 1835 10/11/20 1344   10/09/20 2000  vancomycin (VANCOREADY) IVPB 1250 mg/250 mL  Status:  Discontinued        1,250 mg 166.7 mL/hr over 90 Minutes Intravenous Every 8 hours 10/09/20 1117 10/09/20 1835   10/09/20 1900  vancomycin (VANCOCIN) 2,500 mg in sodium chloride 0.9 % 500 mL IVPB        2,500 mg 250 mL/hr over 120 Minutes Intravenous  Once 10/09/20 1835 10/09/20 2137   10/09/20 1200  vancomycin (VANCOCIN) 2,500 mg in sodium chloride 0.9 % 500 mL IVPB  Status:  Discontinued        2,500 mg 250 mL/hr over 120 Minutes Intravenous  Once 10/09/20 1113 10/09/20 1835   10/07/20 2200  vancomycin (VANCOREADY)  IVPB 2000 mg/400 mL  Status:  Discontinued        2,000 mg 200 mL/hr over 120 Minutes Intravenous Every 12 hours 10/07/20 1251 10/07/20 1437   10/07/20 2000  ceFEPIme (MAXIPIME) 2 g in sodium chloride 0.9 % 100 mL IVPB  Status:  Discontinued        2 g 200 mL/hr over 30 Minutes Intravenous Every 8 hours 10/07/20 1251 10/11/20 1342   10/07/20 1145  ceFEPIme (MAXIPIME) 2 g in sodium chloride 0.9 % 100 mL IVPB        2 g 200 mL/hr over 30 Minutes Intravenous  Once 10/07/20 1048 10/07/20 1351   10/07/20 1100  vancomycin (VANCOCIN) 2,500 mg in sodium chloride 0.9 % 500 mL IVPB  Status:  Discontinued        2,500 mg 250 mL/hr over 120 Minutes Intravenous  Once 10/07/20 1048 10/07/20 1437      Subjective:   Derek Woods seen and examined today.  Feels mouth and throat are feeling better when he eats yogurt.  Denies current chest pain or shortness of breath, abdominal pain, nausea or vomiting, dizziness or headache.     Objective:   Vitals:   11/12/20 1934 11/12/20 2320 11/13/20 0343 11/13/20 0953  BP: 127/70 129/85 128/70 (!) 147/93  Pulse: 85 90 84 82  Resp: 18 16 16 20   Temp: 98.1 F (36.7 C) 97.9 F (36.6 C) 98.1 F (36.7 C) 97.9 F (36.6 C)  TempSrc: Oral Oral Oral Oral  SpO2: 96% 97% 99% 94%  Weight:      Height:        Intake/Output Summary (Last 24 hours) at 11/13/2020 1146 Last data filed at 11/13/2020 11/15/2020 Gross per 24 hour  Intake 1038.86 ml  Output --  Net 1038.86 ml   Filed Weights   10/07/20 1006  Weight: (!) 146.7 kg   Exam  General: Well developed, well nourished, NAD, appears stated age  HEENT: NCAT, mucous membranes moist.   Cardiovascular: S1 S2 auscultated, RRR, cannot appreciate murmur  Respiratory: Clear to auscultation bilaterally anteriorly  Abdomen: Soft, obese, nontender, nondistended, + bowel sounds  Extremities: warm dry without cyanosis clubbing or edema  Neuro: AAOx3, nonfocal  Psych: appropriate mood and affect, pleasant  Data Reviewed: I have personally reviewed following labs and imaging studies  CBC: Recent Labs  Lab 11/13/20 0415  WBC 2.9*  HGB 10.4*  HCT 32.9*  MCV 95.6  PLT 204   Basic Metabolic Panel: Recent Labs  Lab 11/13/20 0415  NA 136  K 4.4  CL 102  CO2 26  GLUCOSE 166*  BUN 19  CREATININE 0.87  CALCIUM 8.8*   GFR: Estimated Creatinine Clearance: 168.8 mL/min (by C-G formula based on SCr of 0.87 mg/dL). Liver Function Tests: No results for input(s): AST, ALT, ALKPHOS, BILITOT, PROT, ALBUMIN in the last 168 hours. No results for input(s): LIPASE, AMYLASE in the last 168 hours. No results for input(s): AMMONIA in the last 168 hours. Coagulation Profile: No results for input(s): INR, PROTIME in the last 168 hours. Cardiac Enzymes: No results for input(s): CKTOTAL, CKMB, CKMBINDEX, TROPONINI in the last 168 hours. BNP (last 3 results) No results for input(s): PROBNP in the last 8760 hours. HbA1C: No results  for input(s): HGBA1C in the last 72 hours. CBG: No results for input(s): GLUCAP in the last 168 hours. Lipid Profile: No results for input(s): CHOL, HDL, LDLCALC, TRIG, CHOLHDL, LDLDIRECT in the last 72 hours. Thyroid Function Tests: No results for input(s):  TSH, T4TOTAL, FREET4, T3FREE, THYROIDAB in the last 72 hours. Anemia Panel: No results for input(s): VITAMINB12, FOLATE, FERRITIN, TIBC, IRON, RETICCTPCT in the last 72 hours. Urine analysis: No results found for: COLORURINE, APPEARANCEUR, LABSPEC, PHURINE, GLUCOSEU, HGBUR, BILIRUBINUR, KETONESUR, PROTEINUR, UROBILINOGEN, NITRITE, LEUKOCYTESUR Sepsis Labs: @LABRCNTIP (procalcitonin:4,lacticidven:4)  )No results found for this or any previous visit (from the past 240 hour(s)).    Radiology Studies: No results found.   Scheduled Meds: . acetaminophen  1,000 mg Oral TID  . amLODipine  5 mg Oral Daily  . buprenorphine-naloxone  2 tablet Sublingual BID  . Chlorhexidine Gluconate Cloth  6 each Topical Daily  . docusate sodium  100 mg Oral BID  . enoxaparin (LOVENOX) injection  70 mg Subcutaneous Q24H  . ferrous sulfate  325 mg Oral BID WC  . lidocaine  1 patch Transdermal Q24H  . mouth rinse  15 mL Mouth Rinse BID  . naproxen  250 mg Oral BID WC  . nicotine  14 mg Transdermal Daily  . pantoprazole  40 mg Oral QHS  . polyethylene glycol  17 g Oral Daily  . pregabalin  50 mg Oral QHS  . senna-docusate  2 tablet Oral QHS  . sodium chloride flush  10-40 mL Intracatheter Q12H  . sodium chloride flush  3 mL Intravenous Q12H   Continuous Infusions: . sodium chloride Stopped (10/16/20 1455)  . vancomycin Stopped (11/13/20 0000)     LOS: 37 days   Time Spent in minutes   30 minutes  Kallie Depolo D.O. on 11/13/2020 at 11:46 AM  Between 7am to 7pm - Please see pager noted on [email or web address]  After 7pm go to [email or web address]  And look for the night coverage person covering for me after hours  [facility] Office   [phone]

## 2020-11-13 NOTE — Plan of Care (Signed)

## 2020-11-14 MED ORDER — NYSTATIN 100000 UNIT/ML MT SUSP
5.0000 mL | Freq: Four times a day (QID) | OROMUCOSAL | Status: DC
  Administered 2020-11-14 – 2020-11-22 (×9): 500000 [IU] via ORAL
  Filled 2020-11-14 (×21): qty 5

## 2020-11-14 NOTE — Progress Notes (Signed)
Physical Therapy Treatment & Discharge Patient Details Name: Derek Woods MRN: [id number] DOB: January 18, 1991 Today's Date: 11/14/2020    History of Present Illness [name] 30 yo male who presented on 10/07/20 with shortness of breath and found to have large R empyema.  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  Medical hx includes daily heroin IVDA (previous suboxone use), asthma, morbid obesity, OSA, and smokes.    PT Comments    Patient has met 6/6 goals. Patient is currently functioning at modI level for mobility with no AD. Patient continues to be limited by endurance. Educated patient on hallway ambulation throughout the day to continue improving endurance, patient verbalized understanding. Educated patient on importance of mobility and physical activity while admitted and at discharge, patient verbalized understanding. No PT follow up recommended at this time. PT will sign off.     Follow Up Recommendations  No PT follow up;Supervision - Intermittent     Equipment Recommendations  None recommended by PT    Recommendations for Other Services       Precautions / Restrictions Precautions Precautions: None Restrictions Weight Bearing Restrictions: No    Mobility  Bed Mobility Overal bed mobility: Modified Independent                  Transfers Overall transfer level: Modified independent Equipment used: None             General transfer comment: walking in room without the use of RW.  Ambulation/Gait Ambulation/Gait assistance: Modified independent (Device/Increase time) Gait Distance (Feet): 400 Feet Assistive device: None Gait Pattern/deviations: WFL(Within Functional Limits)   Gait velocity interpretation: >2.62 ft/sec, indicative of community ambulatory      Stairs Stairs: Yes Stairs assistance: Modified independent (Device/Increase time) Stair Management: One rail Right;Step to pattern;Forwards Number of Stairs: 2     Wheelchair Mobility    Modified Rankin (Stroke Patients Only)       Balance Overall balance assessment: Mild deficits observed, not formally tested                                          Cognition Arousal/Alertness: Awake/alert Behavior During Therapy: WFL for tasks assessed/performed Overall Cognitive Status: Within Functional Limits for tasks assessed                                        Exercises      General Comments        Pertinent Vitals/Pain Pain Assessment: Faces Pain Score: 2  Faces Pain Scale: Hurts a little bit Pain Location: back Pain Descriptors / Indicators: Tightness Pain Intervention(s): Monitored during session    Home Living                      Prior Function            PT Goals (current goals can now be found in the care plan section) Acute Rehab PT Goals Patient Stated Goal: home PT Goal Formulation: With patient Progress towards PT goals: Goals met/education completed, patient discharged from PT    Frequency    Min 3X/week  PT Plan Discharge plan needs to be updated    Co-evaluation              AM-PAC PT "6 Clicks" Mobility   Outcome Measure  Help needed turning from your back to your side while in a flat bed without using bedrails?: None Help needed moving from lying on your back to sitting on the side of a flat bed without using bedrails?: None Help needed moving to and from a bed to a chair (including a wheelchair)?: None Help needed standing up from a chair using your arms (e.g., wheelchair or bedside chair)?: None Help needed to walk in hospital room?: None Help needed climbing 3-5 steps with a railing? : None 6 Click Score: 24    End of Session   Activity Tolerance: Patient tolerated  treatment well Patient left: in bed;with call bell/phone within reach Nurse Communication: Mobility status PT Visit Diagnosis: Other abnormalities of gait and mobility (R26.89);Muscle weakness (generalized) (M62.81);Pain     Time: 5701-7793 PT Time Calculation (min) (ACUTE ONLY): 14 min  Charges:  $Therapeutic Activity: 8-22 mins                     Marquis Diles A. Gilford Rile PT, DPT Acute Rehabilitation Services Pager [phone] Office [phone]    Linna Hoff 11/14/2020, 5:18 PM

## 2020-11-14 NOTE — Progress Notes (Signed)
[facility] PROGRESS NOTE  Derek Woods ATF:[id number] DOB: [birth year] DOA: 10/07/2020 PCP: Pcp, No  Status: Remains inpatient appropriate because:Unsafe d/c plan, IV treatments appropriate due to intensity of illness or inability to take PO and Inpatient level of care appropriate due to severity of illness   Dispo: The patient is from: Home              Anticipated d/c is to: Home to live with mother who is disabled secondary to rheumatoid arthritis.  Post discharge transportation will be an issue.  This patient lives in the [place] area but will need to come to [facility] for the Suboxone clinic as well as for ID clinic and given this we will also arrange for at least 1 PCP appointment after discharge in the [place] area.              Patient currently is not medically stable to d/c.   Difficult to place patient Yes   Level of care: Med-Surg  Code Status: Full Family Communication: Patient DVT prophylaxis: Lovenox Vaccination status: Unknown   HPI: 30 year old male with past medical history of daily IV drug abuse with heroin and has used Suboxone from the streets.  He also has asthma, morbid obesity with a BMI of 52, daily smoker, suspected sleep apnea.  He presented to [facility] on 4/24 with back pain, right flank pain and shortness of breath x1 week.  CT of the chest consistent with large right empyema and he was admitted by the hospitalist team.  Subsequently taken to the OR on 4/26 for VATS and drainage of right empyema but due to severity of disease thoracotomy was completed instead.  He was a difficult intubation prior to the procedure and was unable to be weaned from the ventilator postoperatively.  There were also concerns of possible difficulty with appropriate oxygenation during the procedure.  He was subsequently admitted to the ICU under the care of PCCM.  Eventually extubated on 4/27 and transferred out of ICU on 4/28.  Posterior chest tube removed 4/29.   Intraoperative cultures grew MRSA.  Antibiotics have been de-escalated to vancomycin.  ID consulted on 4/30.  MRI of thoracic spine was done and demonstrated T11/T12 discitis and possible abscess.  Neurosurgery was consulted and he subsequently underwent decompressive thoracic laminectomy on 5/2.  Incidental finding of hep C positive antibodies.   Subjective: Awaken from sleep when I entered room.  Asked him about reports over the weekend of a sore throat.  He clarified that he was actually having a sore mouth and tongue.  Exam appears consistent with oral Candida.  Objective: Vitals:   11/13/20 1959 11/13/20 2322  BP: 137/87 (!) 153/81  Pulse: 94 88  Resp: 17 19  Temp: 98.4 F (36.9 C) 98.6 F (37 C)  SpO2: 97% 100%    Intake/Output Summary (Last 24 hours) at 11/14/2020 0824 Last data filed at 11/14/2020 0300 Gross per 24 hour  Intake 403 ml  Output --  Net 403 ml   Filed Weights   10/07/20 1006  Weight: (!) 146.7 kg    Exam:  Constitutional: Awake, calm, uncomfortable secondary to recurrent oral pain ENT: Tongue reddened with some white exudate.  No other abnormalities. Respiratory: Anterior lung sounds are clear.  Remained stable on room air.  No increased work of breathing when supine. Cardiovascular: Normal heart sounds, regular pulse, no JVD and no peripheral edema Abdomen: LBM 5/29, Neurologic: CN 2-12 grossly intact. Sensation intact, Strength 5/5 x all 4 extremities.  Ambulates with rolling walker. Psychiatric:    Assessment/Plan: Acute problems: Acute respiratory failure with hypercarbia and hypoxia (Resolved)/Right MRSA empyema s/p right thoracotomy on 10/09/2020 Continue vancomycin -ID recommends 6 weeks from 5/2 with last dose due on 11/27/2020.  Call ID prior to last dose of IV antibiotics to determine if transition to oral antibiotics would be indicated   Oral Candida -6/1 begin Mycostatin swish and swallow  Suspected OSA/OHS Will need eventual formal  testing after discharge  T11-12 discitis, osteomyelitis and epidural abscess/S/p decompressive thoracic laminectomy 10/15/2020.   Follow-up lumbar MRI w/o evidence of infection.   Wound reassessed by neurosurgical team on 5/26.  Recommendations are to follow-up in their office after discharge Continue Suboxone,Tylenol, naproxen with p.o. Toradol for breakthrough pain, topical lidocaine, Robaxin, bedtime Lyrica  Essential hypertension New diagnosis- continue Norvasc   Polysubstance abuse/IVDU Continue Suboxone. Plan is to continue after discharge and follow-up at internal medicine Suboxone clinic/Dr. Oswaldo Done  Physical deconditioning Improving and able to ambulate at times for short distances without rolling walker-HHPT  Morbid obesity BMI 52.2 kg/m  Follow-up with PCP regarding weight management strategies  Other problems: Positive hepatitis C antibody No known previous history of hepatitis C infection.   Antibodies positive.   Has follow-up appointment scheduled with [facility]  Anxiety Continue as needed hydroxyzine.  Normocytic anemia/Iron deficiency anemia Hemoglobin is stable over 9.   Continue PO iron  Tobacco dependence Smoking cessation counseling provided.   Data Reviewed: Basic Metabolic Panel: Recent Labs  Lab 11/13/20 0415  NA 136  K 4.4  CL 102  CO2 26  GLUCOSE 166*  BUN 19  CREATININE 0.87  CALCIUM 8.8*   Liver Function Tests: No results for input(s): AST, ALT, ALKPHOS, BILITOT, PROT, ALBUMIN in the last 168 hours.   CBC: Recent Labs  Lab 11/13/20 0415  WBC 2.9*  HGB 10.4*  HCT 32.9*  MCV 95.6  PLT 204     Scheduled Meds: . acetaminophen  1,000 mg Oral TID  . amLODipine  5 mg Oral Daily  . buprenorphine-naloxone  2 tablet Sublingual BID  . Chlorhexidine Gluconate Cloth  6 each Topical Daily  . docusate sodium  100 mg Oral BID  . enoxaparin (LOVENOX) injection  70 mg Subcutaneous Q24H  . ferrous sulfate  325 mg Oral BID WC  .  lidocaine  1 patch Transdermal Q24H  . mouth rinse  15 mL Mouth Rinse BID  . naproxen  250 mg Oral BID WC  . nicotine  14 mg Transdermal Daily  . pantoprazole  40 mg Oral QHS  . polyethylene glycol  17 g Oral Daily  . pregabalin  50 mg Oral QHS  . senna-docusate  2 tablet Oral QHS  . sodium chloride flush  10-40 mL Intracatheter Q12H  . sodium chloride flush  3 mL Intravenous Q12H   Continuous Infusions: . sodium chloride Stopped (10/16/20 1455)  . vancomycin 1,000 mg (11/13/20 2118)    Principal Problem:   Empyema lung (HCC) Active Problems:   Polysubstance dependence including opioid type drug with complication, episodic abuse (HCC)   Morbid obesity with BMI of 50.0-59.9, adult (HCC)   Tobacco dependence   MRSA infection   Abscess in epidural space of thoracic spine   Consultants:  CT surgery  ID  Neurosurgery   Procedures:  Chest tube placement, thoracotomy and intubation  Right upper arm PICC line.  S/p decompressive thoracic laminectomy T11-12 on 10/15/2020   Antibiotics:  Vancomycin 4/24 >>  Maxipime 4/24 through 4/28  Ancef 5/2 through 5/3   Time spent: 35    Junious Silk ANP  [facility] 7 am - 330 pm/M-F for direct patient care and secure chat Please refer to Amion for contact info 38  days

## 2020-11-14 NOTE — Progress Notes (Signed)
Occupational Therapy Treatment Patient Details Name: Derek Woods MRN: [id number] DOB: 04/21/1991 Today's Date: 11/14/2020    History of present illness [name] 30 yo male who presented on 10/07/20 with shortness of breath and found to have large R empyema.  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  Medical hx includes daily heroin IVDA (previous suboxone use), asthma, morbid obesity, OSA, and smokes.   OT comments  Patient has reached all patient focused OT goals.  The patient is currently walking ad lib in his room without a RW, and toileting himself with Mod I.  He is able to use the toileting aide provided.  Patient is showering with setup and supervision in the hospital setting, but no deficits noted, that indicate he would need supervision at home.  He is still using the hip kit for dressing, but states he doesn't need it as much, and dressing is "easy" for him.  PT continues to work on mobility in the halls, but no further OT is needed in the acute setting.  Plan of care, and discontinuing OT discussed, and patient is in agreement.  Encouraged progressive mobility plan with nursing for mobility in the halls, and encouraged patient to arrange times with the nursing staff for showers.  Patient verbalized understanding.  Recommend home when medically cleared with intermittent supervision.    Follow Up Recommendations  No OT follow up;Supervision - Intermittent    Equipment Recommendations  None recommended by OT    Recommendations for Other Services      Precautions / Restrictions Precautions Precautions: None Restrictions Weight Bearing Restrictions: No       Mobility Bed Mobility Overal bed mobility: Modified Independent               Patient Response: Cooperative  Transfers Overall  transfer level: Modified independent               General transfer comment: walking in room without the use of RW.    Balance Overall balance assessment: Mild deficits observed, not formally tested                                         ADL either performed or assessed with clinical judgement   ADL Overall ADL's : At baseline                                       General ADL Comments: Patient is toileting himself with independence/hygiene at Mod I with toileting aide.  Showers with supervision.  Dresses himself with Mod I and decreasing need of hip kit.     Vision Baseline Vision/History: No visual deficits     Perception     Praxis      Cognition Arousal/Alertness: Awake/alert Behavior During Therapy: WFL for tasks assessed/performed Overall Cognitive Status: Within Functional Limits for tasks assessed  Pertinent Vitals/ Pain       Pain Score: 2  Pain Location: back Pain Descriptors / Indicators: Tightness Pain Intervention(s): Monitored during session                                                                     Progress Toward Goals  OT Goals(current goals can now be found in the care plan section)  Progress towards OT goals: Goals met/education completed, patient discharged from OT  Acute Rehab OT Goals OT Goal Formulation: With patient  Plan Frequency needs to be updated                     AM-PAC OT "6 Clicks" Daily Activity     Outcome Measure   Help from another person eating meals?: None Help from another person taking care of personal grooming?: None Help from another person toileting, which includes using toliet, bedpan, or urinal?: None Help from another person bathing (including washing, rinsing, drying)?: None Help from another person to put on and taking off regular upper body  clothing?: None Help from another person to put on and taking off regular lower body clothing?: None 6 Click Score: 24    End of Session    OT Visit Diagnosis: Unsteadiness on feet (R26.81)   Activity Tolerance Patient tolerated treatment well   Patient Left in bed;with call bell/phone within reach   Nurse Communication Other (comment) (ability to use toileting aide)        Time: 7129-2909 OT Time Calculation (min): 15 min  Charges: OT General Charges $OT Visit: 1 Visit OT Treatments $Self Care/Home Management : 8-22 mins  11/14/2020  Rich, OTR/L  Acute Rehabilitation Services  Office:  [phone]    Metta Clines 11/14/2020, 2:39 PM

## 2020-11-15 LAB — VANCOMYCIN, TROUGH: Vancomycin Tr: 13 ug/mL — ABNORMAL LOW (ref 15–20)

## 2020-11-15 NOTE — Progress Notes (Signed)
Patient continues to refuse 4AM vitals in order to sleep. Patient was educated on importance of taking vitals, he understood but does not want his vital signs taken at 4AM.

## 2020-11-15 NOTE — Progress Notes (Signed)
[facility] PROGRESS NOTE  Derek Woods [id number]:[id number] DOB: [birth year] DOA: 10/07/2020 PCP: Pcp, No  Status: Remains inpatient appropriate because:Unsafe d/c plan, IV treatments appropriate due to intensity of illness or inability to take PO and Inpatient level of care appropriate due to severity of illness   Dispo: The patient is from: Home              Anticipated d/c is to: Home to live with mother who is disabled secondary to rheumatoid arthritis.  Post discharge transportation will be an issue.  This patient lives in the [place] area but will need to come to [facility] for the Suboxone clinic as well as for ID clinic and given this we will also arrange for at least 1 PCP appointment after discharge in the [place] area.              Patient currently is not medically stable to d/c.   Difficult to place patient Yes   Level of care: Med-Surg  Code Status: Full Family Communication: Patient DVT prophylaxis: Lovenox Vaccination status: Unknown   HPI: 30 year old male with past medical history of daily IV drug abuse with heroin and has used Suboxone from the streets.  He also has asthma, morbid obesity with a BMI of 52, daily smoker, suspected sleep apnea.  He [facility] on 4/24 with back pain, right flank pain and shortness of breath x1 week.  CT of the chest consistent with large right empyema and he was admitted by the hospitalist team.  Subsequently taken to the OR on 4/26 for VATS and drainage of right empyema but due to severity of disease thoracotomy was completed instead.  He was a difficult intubation prior to the procedure and was unable to be weaned from the ventilator postoperatively.  There were also concerns of possible difficulty with appropriate oxygenation during the procedure.  He was subsequently admitted to the ICU under the care of PCCM.  Eventually extubated on 4/27 and transferred out of ICU on 4/28.  Posterior chest tube removed 4/29.   Intraoperative cultures grew MRSA.  Antibiotics have been de-escalated to vancomycin.  ID consulted on 4/30.  MRI of thoracic spine was done and demonstrated T11/T12 discitis and possible abscess.  Neurosurgery was consulted and he subsequently underwent decompressive thoracic laminectomy on 5/2.  Incidental finding of hep C positive antibodies.   Subjective: Awake-still with oral pain but not severe  Objective: Vitals:   11/14/20 2022 11/14/20 2329  BP: 134/75 (!) 136/95  Pulse: 99 91  Resp: 19 19  Temp: 98.5 F (36.9 C) 97.7 F (36.5 C)  SpO2: 95% 99%    Intake/Output Summary (Last 24 hours) at 11/15/2020 0803 Last data filed at 11/14/2020 2147 Gross per 24 hour  Intake 3 ml  Output --  Net 3 ml   Filed Weights   10/07/20 1006  Weight: (!) 146.7 kg    Exam:  Constitutional: Awake, NAD ENT: Tongue reddened with some white exudate.  No other abnormalities. Respiratory: Lungs clear, RA Cardiovascular: S1S2, pulse regular, no LE edema Abdomen: LBM 6/1,soft, BS+ eating well Neurologic: CN 2-12 grossly intact. Sensation intact, Strength 5/5 x all 4 extremities.  Ambulates with rolling walker. Psychiatric: Alert and oriented x 3   Assessment/Plan: Acute problems: Acute respiratory failure with hypercarbia and hypoxia (Resolved)/Right MRSA empyema s/p right thoracotomy on 10/09/2020 Continue vancomycin -ID recommends 6 weeks from 5/2 with last dose due on 11/27/2020.  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   Follow-up lumbar MRI w/o evidence of infection.   Wound reassessed by neurosurgical team on 5/26.  Recommendations are to follow-up in their office after discharge Continue  Suboxone,Tylenol, naproxen with p.o. Toradol for breakthrough pain, topical lidocaine, Robaxin, bedtime Lyrica  Essential hypertension New diagnosis- continue Norvasc   Polysubstance abuse/IVDU Continue Suboxone. Plan is to continue after discharge and follow-up at internal medicine Suboxone clinic/Dr. Oswaldo Done  Physical deconditioning Improving and able to ambulate at times for short distances without rolling walker-HHPT  Morbid obesity BMI 52.2 kg/m  Follow-up with PCP regarding weight management strategies  Other problems: Positive hepatitis C antibody No known previous history of hepatitis C infection.   Antibodies positive.   Has follow-up appointment scheduled with [facility]  Anxiety Continue as needed hydroxyzine.  Normocytic anemia/Iron deficiency anemia Hemoglobin is stable over 9.   Continue PO iron  Tobacco dependence Smoking cessation counseling provided.   Data Reviewed: Basic Metabolic Panel: Recent Labs  Lab 11/13/20 0415  NA 136  K 4.4  CL 102  CO2 26  GLUCOSE 166*  BUN 19  CREATININE 0.87  CALCIUM 8.8*   Liver Function Tests: No results for input(s): AST, ALT, ALKPHOS, BILITOT, PROT, ALBUMIN in the last 168 hours.   CBC: Recent Labs  Lab 11/13/20 0415  WBC 2.9*  HGB 10.4*  HCT 32.9*  MCV 95.6  PLT 204     Scheduled Meds: . acetaminophen  1,000 mg Oral TID  . amLODipine  5 mg Oral Daily  . buprenorphine-naloxone  2 tablet Sublingual BID  . Chlorhexidine Gluconate Cloth  6 each Topical Daily  . docusate sodium  100 mg Oral BID  . enoxaparin (LOVENOX) injection  70 mg Subcutaneous Q24H  . ferrous sulfate  325 mg Oral BID WC  . lidocaine  1 patch Transdermal Q24H  . mouth rinse  15 mL Mouth Rinse BID  . naproxen  250 mg Oral BID WC  . nicotine  14 mg Transdermal Daily  . nystatin  5 mL Oral QID  . pantoprazole  40 mg Oral QHS  . polyethylene glycol  17 g Oral Daily  . pregabalin  50 mg Oral QHS  . senna-docusate  2 tablet Oral  QHS  . sodium chloride flush  10-40 mL Intracatheter Q12H  . sodium chloride flush  3 mL Intravenous Q12H   Continuous Infusions: . sodium chloride Stopped (10/16/20 1455)  . 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  S/p decompressive thoracic laminectomy T11-12 on 10/15/2020   Antibiotics:  Vancomycin 4/24 >>  Maxipime 4/24 through 4/28  Ancef 5/2 through 5/3   Time spent: 35    Junious Silk ANP  [facility] 7 am - 330 pm/M-F for direct patient care and secure chat Please refer to Amion for contact info 39  days

## 2020-11-15 NOTE — Plan of Care (Signed)

## 2020-11-15 NOTE — Progress Notes (Signed)
Pharmacy Antibiotic Note  Derek Woods is a 30 y.o. male admitted on 10/07/2020 with Osteo.  Pharmacy has been consulted for Vancomycin dosing.    R empyema, T11-12 discitis with MRSA osteo. HepC positive antibodies. Afebrile. WBC down to 1.9. - 4/26 s/p thoracotomy ( mini thora VATS) - 5/2 s/p laminectomy/surg decompression - hemovac out  - 4/30 MRI epidural abscess/discitis/osteo T11-12  4/24 cefepime >>4/28 4/24 Vanc >> 6/13     4/28 VT 30 - hold vanc 4/29 VR 11 - vanc 2g q24h (ke 0.062, half life 11.2hr > est AUC 487) 5/4 VP 40, VT 8, AUC 575 on 2g q24h >> adjust to 1000mg  q12h (eAUC 575) 5/11 VT 12 - > continue 1 gram Q 12 5/17 VP 30,  VT 10,  calculated AUC 462.8, continue 1g q12h 5/26 VT 12 -> same as other values 6/2 VT 13 - > no change in dose  4/24 BCx: collected prior to abx: neg 4/25 MRSA PCR +  4/26 soft tissue Cx: MRSA 4/26 pleural fluid: MRSA 5/2 epidural abscess: rare MRSA  Plan: Continue vancomycin to 1000mg  IV q12h  Vanc for osteo, end date 6/13 (treat in hospital, 6 wks from 5/2l) Weekly levels - next 6/2    Height: 5\' 6"  (167.6 cm) Weight: (!) 146.7 kg (323 lb 6.4 oz) IBW/kg (Calculated) : 63.8  Temp (24hrs), Avg:98.3 F (36.8 C), Min:97.7 F (36.5 C), Max:98.7 F (37.1 C)  Recent Labs  Lab 11/13/20 0415 11/15/20 0930  WBC 2.9*  --   CREATININE 0.87  --   VANCOTROUGH  --  13*    Estimated Creatinine Clearance: 168.8 mL/min (by C-G formula based on SCr of 0.87 mg/dL).    Allergies  Allergen Reactions  . Augmentin [Amoxicillin-Pot Clavulanate] Other (See Comments)    Pt does not recall, childhood  . Ceclor [Cefaclor] Other (See Comments)    Pt does not recall, childhood  . Sulfa Antibiotics Hives    Thank you , PharmD [email or web address]  11/15/2020 12:38 PM

## 2020-11-15 NOTE — Progress Notes (Signed)
RN on the floor obtained labs

## 2020-11-16 NOTE — TOC Progression Note (Signed)
Transition of Care [facility]) - Progression Note    Patient Details  Name: Derek Woods MRN: [id number] Date of Birth: 1990/12/25  Transition of Care [facility]) CM/SW [place], RN Phone Number: 11/16/2020, 1:25 PM  Clinical Narrative:    Case management met with the patient at the bedside after patient verbalized concern over completing Medicaid application on his cell phone while he was in the hospital.  I left an email for Theressa [place], financial counseling and asked her for assistance for the patient.  I explained to the patient that financial counseling would follow up but patient may not have financial and/or medical qualifications for [facility] program but financial counseling would answer to his concerns.  The patient states that he will not need RW nor 3:1 for home since he was able to ambulate without assistance at this time.  The patient plans to discharge home with his mother and plans to have transportation assistance through his father, who has a car and is able to drive him to appointments.  The patient states that his brother has substance abuse history as well and attends video conferences with a physician in [place] for Suboxone medication and therapy.  He plans to follow up with this clinic since he will not have daily transportation outside of [place], [place].  CM and MSW will continue to follow the patient for discharge needs for home - pending discharge for 11/27/2020.   Expected Discharge Plan: Home/Self Care Barriers to Discharge: Continued Medical Work up,Inadequate or no insurance,Active Substance Use with PICC Line  Expected Discharge Plan and Services Expected Discharge Plan: Home/Self Care In-house Referral: Clinical Social [facility] / [facility],Financial Counselor Discharge Planning Services: CM Consult,Indigent Health Clinic,Medication Assistance,Follow-up appt scheduled Post Acute Care Choice: [place]  arrangements for the past 2 months: Apartment                 DME Arranged: 3-N-1,Walker rolling (Patient will need bariatric RW and 3:1 closer to discharge home from the hospital.) DME Agency: [employer]                   Social Determinants of Health ([place]) Interventions    Readmission Risk Interventions Readmission Risk Prevention Plan 10/30/2020  Transportation Screening Complete  PCP or Specialist Appt within 5-7 Days Complete  Home Care Screening Complete  Medication Review (RN CM) Complete

## 2020-11-16 NOTE — Progress Notes (Addendum)
[facility] PROGRESS NOTE  Derek Woods YQM:[id number] DOB: 1990-11-15 DOA: 10/07/2020 PCP: Pcp, No  Status: Remains inpatient appropriate because:Unsafe d/c plan, IV treatments appropriate due to intensity of illness or inability to take PO and Inpatient level of care appropriate due to severity of illness   Dispo: The patient is from: Home              Anticipated d/c is to: Home to live with mother who is disabled secondary to rheumatoid arthritis.  Post discharge transportation will be an issue.  This patient lives in the [place] area but will need to come to [facility] for ID clinic and given this we will also arrange for at least 1 PCP appointment after discharge in the [place] area.  He will be able to establish at a Suboxone clinic in [facility]              Patient currently is not medically stable to d/c.   Difficult to place patient Yes   Level of care: Med-Surg  Code Status: Full Family Communication: Patient DVT prophylaxis: Lovenox Vaccination status: Unknown   HPI: 30 year old male with past medical history of daily IV drug abuse with heroin and has used Suboxone from the streets.  He also has asthma, morbid obesity with a BMI of 52, daily smoker, suspected sleep apnea.  He presented to [facility] on 4/24 with back pain, right flank pain and shortness of breath x1 week.  CT of the chest consistent with large right empyema and he was admitted by the hospitalist team.  Subsequently taken to the OR on 4/26 for VATS and drainage of right empyema but due to severity of disease thoracotomy was completed instead.  He was a difficult intubation prior to the procedure and was unable to be weaned from the ventilator postoperatively.  There were also concerns of possible difficulty with appropriate oxygenation during the procedure.  He was subsequently admitted to the ICU under the care of PCCM.  Eventually extubated on 4/27 and transferred out of ICU on 4/28.  Posterior chest  tube removed 4/29.  Intraoperative cultures grew MRSA.  Antibiotics have been de-escalated to vancomycin.  ID consulted on 4/30.  MRI of thoracic spine was done and demonstrated T11/T12 discitis and possible abscess.  Neurosurgery was consulted and he subsequently underwent decompressive thoracic laminectomy on 5/2.  Incidental finding of hep C positive antibodies.   Subjective: No complaints.  Objective: Vitals:   11/15/20 1155 11/15/20 2024  BP: 140/76 (!) 141/87  Pulse: 89 94  Resp: 17 16  Temp: 98.7 F (37.1 C) 98.4 F (36.9 C)  SpO2: 96% 98%    Intake/Output Summary (Last 24 hours) at 11/16/2020 5284 Last data filed at 11/15/2020 2222 Gross per 24 hour  Intake 243 ml  Output --  Net 243 ml   Filed Weights   10/07/20 1006  Weight: (!) 146.7 kg    Exam:  Constitutional: Awake, no acute distress ENT: Tongue reddened with some white exudate.  No other abnormalities. Respiratory: Lungs clear, remained stable on room air Cardiovascular: Normal heart sounds, normotensive.  No peripheral edema Abdomen: LBM 6/1, soft nontender with normoactive bowel sounds and eating well Neurologic: CN 2-12 grossly intact. Sensation intact, Strength 5/5 x all 4 extremities.  Ambulates with rolling walker. Psychiatric: Alert and oriented x3 with appropriate affect   Assessment/Plan: Acute problems: Acute respiratory failure with hypercarbia and hypoxia (Resolved)/Right MRSA empyema s/p right thoracotomy on 10/09/2020 Continue vancomycin x 6 weeks (per ID) from  5/2 with last dose due on 11/27/2020.  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   Follow-up lumbar MRI w/o evidence of infection.   Wound reassessed by  neurosurgical team on 5/26.  Recommendations are to follow-up in their office after discharge Continue Suboxone,Tylenol, naproxen with p.o. Toradol for breakthrough pain, topical lidocaine, Robaxin, bedtime Lyrica  Essential hypertension New diagnosis- continue Norvasc   Polysubstance abuse/IVDU Continue Suboxone. Plan is to continue after discharge and follow-up at internal medicine Suboxone clinic in  which is a telemetry medicine clinic.  Physical deconditioning Improving and able to ambulate at times for short distances without rolling walker-HHPT  Morbid obesity BMI 52.2 kg/m  Follow-up with PCP regarding weight management strategies  Other problems: Positive hepatitis C antibody No known previous history of hepatitis C infection.   Antibodies positive.   Has follow-up appointment scheduled with [facility]  Anxiety Continue as needed hydroxyzine.  Normocytic anemia/Iron deficiency anemia Hemoglobin is stable over 9.   Continue PO iron  Tobacco dependence Smoking cessation counseling provided.   Data Reviewed: Basic Metabolic Panel: Recent Labs  Lab 11/13/20 0415  NA 136  K 4.4  CL 102  CO2 26  GLUCOSE 166*  BUN 19  CREATININE 0.87  CALCIUM 8.8*   Liver Function Tests: No results for input(s): AST, ALT, ALKPHOS, BILITOT, PROT, ALBUMIN in the last 168 hours.   CBC: Recent Labs  Lab 11/13/20 0415  WBC 2.9*  HGB 10.4*  HCT 32.9*  MCV 95.6  PLT 204     Scheduled Meds: . acetaminophen  1,000 mg Oral TID  . amLODipine  5 mg Oral Daily  . buprenorphine-naloxone  2 tablet Sublingual BID  . Chlorhexidine Gluconate Cloth  6 each Topical Daily  . docusate sodium  100 mg Oral BID  . enoxaparin (LOVENOX) injection  70 mg Subcutaneous Q24H  . ferrous sulfate  325 mg Oral BID WC  . lidocaine  1 patch Transdermal Q24H  . mouth rinse  15 mL Mouth Rinse BID  . naproxen  250 mg Oral BID WC  . nicotine  14 mg Transdermal Daily  . nystatin  5 mL Oral  QID  . pantoprazole  40 mg Oral QHS  . polyethylene glycol  17 g Oral Daily  . pregabalin  50 mg Oral QHS  . senna-docusate  2 tablet Oral QHS  . sodium chloride flush  10-40 mL Intracatheter Q12H  . sodium chloride flush  3 mL Intravenous Q12H   Continuous Infusions: . sodium chloride 1,000 mL (11/15/20 2219)  . 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  S/p decompressive thoracic laminectomy T11-12 on 10/15/2020   Antibiotics:  Vancomycin 4/24 >>  Maxipime 4/24 through 4/28  Ancef 5/2 through 5/3   Time spent: 35    Junious Silk ANP  [facility] 7 am - 330  pm/M-F for direct patient care and secure chat Please refer to Amion for contact info 40  days

## 2020-11-17 NOTE — Progress Notes (Signed)
[facility] PROGRESS NOTE  Derek Woods OJJ:[id number] DOB: 09-11-1990 DOA: 10/07/2020 PCP: Pcp, No   HPI: 30 year old male with past medical history of daily IV drug abuse with heroin and has used Suboxone from the streets.  He also has asthma, morbid obesity with a BMI of 52, daily smoker, suspected sleep apnea.  He presented to [facility] on 4/24 with back pain, right flank pain and shortness of breath x1 week.  CT of the chest consistent with large right empyema and he was admitted by the hospitalist team.  Subsequently, patient was taken to the OR on 4/26 for VATS and drainage of right empyema but due to severity of disease thoracotomy was completed instead.  He was a difficult intubation prior to the procedure and was unable to be weaned from the ventilator postoperatively.  There were also concerns of possible difficulty with appropriate oxygenation during the procedure.  He was subsequently admitted to the ICU under the care of PCCM.  Eventually extubated on 4/27 and transferred out of ICU on 4/28.  Posterior chest tube removed 4/29.  Intraoperative cultures grew MRSA.  Antibiotics have been de-escalated to vancomycin.  ID consulted on 4/30.  MRI of thoracic spine was done and demonstrated T11/T12 discitis and possible abscess.  Neurosurgery was consulted and he subsequently underwent decompressive thoracic laminectomy on 5/2.  Incidental finding of hep C positive antibodies.  At this time, patient is in the hospital due to need for ongoing IV antibiotics and difficult disposition.   Subjective: Today, patient was seen and examined at bedside.  Denies any pain, nausea, vomiting, shortness of breath cough or fever.  Objective: Vitals:   11/16/20 0935 11/16/20 1940  BP: 123/81 132/80  Pulse: 86 90  Resp: 19 18  Temp: 98.1 F (36.7 C) 98.6 F (37 C)  SpO2: 95% 96%    Intake/Output Summary (Last 24 hours) at 11/17/2020 0841 Last data filed at 11/16/2020 2256 Gross per 24  hour  Intake 240 ml  Output --  Net 240 ml   Filed Weights   10/07/20 1006  Weight: (!) 146.7 kg   Body mass index is 52.2 kg/m.  Physical exam: General: Morbidly obese built, not in obvious distress HENT:   No scleral pallor or icterus noted. Oral mucosa is moist.  Chest:   Diminished breath sounds bilaterally. No crackles or wheezes.  CVS: S1 &S2 heard. No murmur.  Regular rate and rhythm. Abdomen: Soft, nontender, nondistended.  Bowel sounds are heard.   Extremities: No cyanosis, clubbing or edema.  Peripheral pulses are palpable. Psych: Alert, awake and oriented, normal mood CNS:  No cranial nerve deficits.  Power equal in all extremities.   Skin: Warm and dry.  No rashes noted. .  Assessment/Plan:  Acute respiratory failure with hypercarbia and hypoxia /Right MRSA empyema s/p right thoracotomy on 10/09/2020 Currently on IV vancomycin.  Continue vancomycin last dose on 11/27/2020.   ID to be consulted again when he is approaching end date of antibiotics for reevaluation for transition to oral antibiotics +/- reimaging.    Suspected OSA/OHS Will benefit from formal outpatient sleep study testing.  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   Follow-up lumbar MRI w/o evidence of infection.    Neurosurgery recommends outpatient follow-up..  Continue with pain management.  Currently on Suboxone Tylenol naproxen and p.o. Toradol.  Continue Robaxin lidocaine and Lyrica as well.  Essential hypertension Norvasc initiated this hospitalization.  Blood  pressure is stable  Polysubstance abuse/IVDU Continue Suboxone.  Plans to follow-up at the Suboxone clinic on discharge.  Physical deconditioning PT has recommended home health PT on discharge.  Continue physical therapy while in the hospital  Morbid obesity Would benefit from outpatient efforts on weight loss.  Positive  hepatitis C antibody No known previous history of hepatitis C infection.  Antibodies positive.  ID will follow up HCV RNA and have made a follow-up appointment at [facility] for HCV treatment on 6/21 at 8:45 AM.  Anxiety Continue as needed hydroxyzine.  Normocytic anemia/Iron deficiency anemia Hemoglobin has remained stable.  On p.o. iron.  Tobacco dependence Not on nicotine patch.   Status: Inpatient Remains inpatient appropriate because:Unsafe d/c plan, IV treatments appropriate due to intensity of illness or inability to take PO and Inpatient level of care appropriate due to severity of illness, need for IV antibiotics   Dispo: The patient is from: Home              Anticipated d/c is to: Home with home health              Patient currently is not medically stable to d/c.   Difficult to place patient Yes   Code Status: Full  Family Communication:  None  DVT prophylaxis: Lovenox subcu   Data Reviewed:  Basic Metabolic Panel: Recent Labs  Lab 11/13/20 0415  NA 136  K 4.4  CL 102  CO2 26  GLUCOSE 166*  BUN 19  CREATININE 0.87  CALCIUM 8.8*   Liver Function Tests: No results for input(s): AST, ALT, ALKPHOS, BILITOT, PROT, ALBUMIN in the last 168 hours.   CBC: Recent Labs  Lab 11/13/20 0415  WBC 2.9*  HGB 10.4*  HCT 32.9*  MCV 95.6  PLT 204     Scheduled Meds: . acetaminophen  1,000 mg Oral TID  . amLODipine  5 mg Oral Daily  . buprenorphine-naloxone  2 tablet Sublingual BID  . Chlorhexidine Gluconate Cloth  6 each Topical Daily  . docusate sodium  100 mg Oral BID  . enoxaparin (LOVENOX) injection  70 mg Subcutaneous Q24H  . ferrous sulfate  325 mg Oral BID WC  . lidocaine  1 patch Transdermal Q24H  . mouth rinse  15 mL Mouth Rinse BID  . naproxen  250 mg Oral BID WC  . nicotine  14 mg Transdermal Daily  . nystatin  5 mL Oral QID  . pantoprazole  40 mg Oral QHS  . polyethylene glycol  17 g Oral Daily  . pregabalin  50 mg Oral QHS  .  senna-docusate  2 tablet Oral QHS  . sodium chloride flush  10-40 mL Intracatheter Q12H  . sodium chloride flush  3 mL Intravenous Q12H   Continuous Infusions: . sodium chloride 1,000 mL (11/16/20 2254)  . vancomycin 1,000 mg (11/16/20 2256)    Principal Problem:   Empyema lung (HCC) Active Problems:   Polysubstance dependence including opioid type drug with complication, episodic abuse (HCC)   Morbid obesity with BMI of 50.0-59.9, adult (HCC)   Tobacco dependence   MRSA infection   Abscess in epidural space of thoracic spine   Consultants:  CT surgery  ID  Neurosurgery   Procedures:  Chest tube placement, thoracotomy and intubation  Right upper arm PICC line.  S/p decompressive thoracic laminectomy T11-12 on 10/15/2020   Antibiotics:  Vancomycin 4/24 >>  Maxipime 4/24 through 4/28  Ancef 5/2 through 5/3    Joycelyn Das, MD [facility]  41  days

## 2020-11-18 DIAGNOSIS — Z09 Encounter for follow-up examination after completed treatment for conditions other than malignant neoplasm: Secondary | ICD-10-CM

## 2020-11-18 NOTE — Progress Notes (Signed)
Pharmacy Antibiotic Note  Derek Woods is a 30 y.o. male admitted on 10/07/2020 with Osteo.  Pharmacy was consulted on 10/07/20 for Vancomycin dosing.   R empyema, T11-12 discitis with MRSA osteo. HepC positive antibodies. Afebrile. WBC down to 2.9 on 5/31.. - 4/26 s/p thoracotomy ( mini thora VATS) - 5/2 s/p laminectomy/surg decompression - hemovac out  - 4/30 MRI epidural abscess/discitis/osteo T11-12  4/24 cefepime >>4/28 4/24 Vanc >> 6/13     4/28 VT 30 - held vanc 4/29 VR 11 - vanc 2g q24h (ke 0.062, half life 11.2hr > est AUC 487) 5/4 VP 40, VT 8, AUC 575 on 2g q24h >> adjust to 1000mg  q12h (eAUC 575) 5/11 VT 12 - > continue 1 gram Q 12 5/17 VP 30,  VT 10,  calculated AUC 462.8, continue 1g q12h 5/26 VT 12 -> same as other values 6/2 VT 13 - > no change in dose  4/24 BCx: collected prior to abx: neg 4/25 MRSA PCR +  4/26 soft tissue Cx: MRSA 4/26 pleural fluid: MRSA 5/2 epidural abscess: rare MRSA  Plan: Continue vancomycin 1000mg  IV q12h  Vanc for osteo, end date 6/13 (treat in hospital, 6 wks from 5/2l) Weekly levels - next 6/9    Height: 5\' 6"  (167.6 cm) Weight: (!) 146.7 kg (323 lb 6.4 oz) IBW/kg (Calculated) : 63.8  Temp (24hrs), Avg:98.5 F (36.9 C), Min:98.4 F (36.9 C), Max:98.6 F (37 C)  Recent Labs  Lab 11/13/20 0415 11/15/20 0930  WBC 2.9*  --   CREATININE 0.87  --   VANCOTROUGH  --  13*    Estimated Creatinine Clearance: 168.8 mL/min (by C-G formula based on SCr of 0.87 mg/dL).    Allergies  Allergen Reactions  . Augmentin [Amoxicillin-Pot Clavulanate] Other (See Comments)    Pt does not recall, childhood  . Ceclor [Cefaclor] Other (See Comments)    Pt does not recall, childhood  . Sulfa Antibiotics Hives    Thank you , RPh Clinical Pharmacist  [phone] Please check AMION for all [facility] phone numbers After 10:00 PM, call Main Pharmacy [phone] 11/18/2020 10:54 AM

## 2020-11-18 NOTE — Progress Notes (Signed)
[facility] PROGRESS NOTE  Derek Woods [id number] DOB: [birth year] DOA: 10/07/2020 PCP: Pcp, No   HPI: 30 year old male with past medical history of daily IV drug abuse with heroin and has used Suboxone from the streets.  He also has asthma, morbid obesity with a BMI of 52, daily smoker, suspected sleep apnea.  He presented to [facility] on 4/24 with back pain, right flank pain and shortness of breath x1 week.  CT of the chest consistent with large right empyema and he was admitted by the hospitalist team.  Subsequently, patient was taken to the OR on 4/26 for VATS and drainage of right empyema but due to severity of disease thoracotomy was completed instead.  He was a difficult intubation prior to the procedure and was unable to be weaned from the ventilator postoperatively.  There were also concerns of possible difficulty with appropriate oxygenation during the procedure.  He was subsequently admitted to the ICU under the care of PCCM.  Eventually extubated on 4/27 and transferred out of ICU on 4/28.  Posterior chest tube removed 4/29.  Intraoperative cultures grew MRSA.  Antibiotics have been de-escalated to vancomycin.  ID consulted on 4/30.  MRI of thoracic spine was done and demonstrated T11/T12 discitis and possible abscess.  Neurosurgery was consulted and he subsequently underwent decompressive thoracic laminectomy on 5/2.  Incidental finding of hep C positive antibodies.  At this time, patient is in the hospital due to need for ongoing IV antibiotics and difficult disposition.   Subjective: Today, patient was seen and examined at bedside.  Denies any nausea vomiting fever chills or rigor.  Objective: Vitals:   11/17/20 1935 11/18/20 1016  BP: 125/66 130/83  Pulse: 100 81  Resp: 20   Temp: 98.6 F (37 C) 98.4 F (36.9 C)  SpO2: 98% 98%    Intake/Output Summary (Last 24 hours) at 11/18/2020 1317 Last data filed at 11/18/2020 1022 Gross per 24 hour  Intake 400 ml   Output --  Net 400 ml   Filed Weights   10/07/20 1006  Weight: (!) 146.7 kg   Body mass index is 52.2 kg/m.  Physical exam: General: Obese built.  HENT:   No scleral pallor or icterus noted. Oral mucosa is moist.  Chest:   Diminished breath sounds bilaterally. No crackles or wheezes.  CVS: S1 &S2 heard. No murmur.  Regular rate and rhythm. Abdomen: Soft, nontender, nondistended.  Bowel sounds are heard.   Extremities: No cyanosis, clubbing or edema.  Peripheral pulses are palpable. Psych: Alert, awake and oriented, normal mood CNS:  No cranial nerve deficits.  Power equal in all extremities.   Skin: Warm and dry.  No rashes noted. .   Assessment/Plan:  Acute respiratory failure with hypercarbia and hypoxia /Right MRSA empyema s/p right thoracotomy on 10/09/2020 Currently on IV vancomycin.  Continue vancomycin last dose on 11/27/2020.   ID to be consulted again when he is approaching end date of antibiotics for reevaluation for transition to oral antibiotics +/- reimaging.    Suspected OSA/OHS Will benefit from formal outpatient sleep study testing.  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   Follow-up lumbar MRI w/o evidence of infection.    Neurosurgery recommends outpatient follow-up..  Continue with pain management.  Currently on Suboxone Tylenol naproxen and p.o. Toradol.  Continue Robaxin lidocaine and Lyrica as well.  Essential hypertension Norvasc initiated this hospitalization.  Blood pressure is stable  Polysubstance abuse/IVDU  Continue Suboxone.  Plans to follow-up at the Suboxone clinic on discharge.  Physical deconditioning PT has recommended home health PT on discharge.  Continue physical therapy while in the hospital  Morbid obesity Would benefit from outpatient efforts on weight loss.  Positive hepatitis C antibody No known previous history of hepatitis  C infection.  Antibodies positive.  ID will follow up HCV RNA and have made a follow-up appointment at [facility] for HCV treatment on 6/21 at 8:45 AM.  Anxiety Continue as needed hydroxyzine.  Normocytic anemia/Iron deficiency anemia Hemoglobin has remained stable.  On p.o. iron.  Check CBC in AM.  Tobacco dependence Not on nicotine patch.   Status: Inpatient Remains inpatient appropriate because:Unsafe d/c plan, IV treatments appropriate due to intensity of illness or inability to take PO and Inpatient level of care appropriate due to severity of illness, need for IV antibiotics   Dispo: The patient is from: Home              Anticipated d/c is to: Home with home health              Patient currently is not medically stable to d/c.   Difficult to place patient Yes   Code Status: Full  Family Communication:  None  DVT prophylaxis: Lovenox subcu   Data Reviewed:  Basic Metabolic Panel: Recent Labs  Lab 11/13/20 0415  NA 136  K 4.4  CL 102  CO2 26  GLUCOSE 166*  BUN 19  CREATININE 0.87  CALCIUM 8.8*   Liver Function Tests: No results for input(s): AST, ALT, ALKPHOS, BILITOT, PROT, ALBUMIN in the last 168 hours.   CBC: Recent Labs  Lab 11/13/20 0415  WBC 2.9*  HGB 10.4*  HCT 32.9*  MCV 95.6  PLT 204     Scheduled Meds: . acetaminophen  1,000 mg Oral TID  . amLODipine  5 mg Oral Daily  . buprenorphine-naloxone  2 tablet Sublingual BID  . Chlorhexidine Gluconate Cloth  6 each Topical Daily  . docusate sodium  100 mg Oral BID  . enoxaparin (LOVENOX) injection  70 mg Subcutaneous Q24H  . ferrous sulfate  325 mg Oral BID WC  . lidocaine  1 patch Transdermal Q24H  . mouth rinse  15 mL Mouth Rinse BID  . naproxen  250 mg Oral BID WC  . nicotine  14 mg Transdermal Daily  . nystatin  5 mL Oral QID  . pantoprazole  40 mg Oral QHS  . polyethylene glycol  17 g Oral Daily  . pregabalin  50 mg Oral QHS  . senna-docusate  2 tablet Oral QHS  . sodium chloride  flush  10-40 mL Intracatheter Q12H  . sodium chloride flush  3 mL Intravenous Q12H   Continuous Infusions: . sodium chloride 1,000 mL (11/16/20 2254)  . 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  S/p decompressive thoracic laminectomy T11-12 on 10/15/2020   Antibiotics:  Vancomycin 4/24 >>  Maxipime 4/24 through 4/28  Ancef 5/2 through 5/3    Joycelyn Das, MD [facility]  42  days

## 2020-11-19 LAB — BASIC METABOLIC PANEL
Anion gap: 7 (ref 5–15)
BUN: 18 mg/dL (ref 6–20)
CO2: 26 mmol/L (ref 22–32)
Calcium: 9.1 mg/dL (ref 8.9–10.3)
Chloride: 104 mmol/L (ref 98–111)
Creatinine, Ser: 0.8 mg/dL (ref 0.61–1.24)
GFR, Estimated: >60 mL/min (ref >60)
Glucose, Bld: 159 mg/dL — ABNORMAL HIGH (ref 70–99)
Potassium: 4.6 mmol/L (ref 3.5–5.1)
Sodium: 137 mmol/L (ref 135–145)

## 2020-11-19 LAB — CBC
HCT: 36.6 % — ABNORMAL LOW (ref 39.0–52.0)
Hemoglobin: 11.7 g/dL — ABNORMAL LOW (ref 13.0–17.0)
MCH: 30.3 pg (ref 26.0–34.0)
MCHC: 32 g/dL (ref 30.0–36.0)
MCV: 94.8 fL (ref 80.0–100.0)
Platelets: 271 10*3/uL (ref 150–400)
RBC: 3.86 MIL/uL — ABNORMAL LOW (ref 4.22–5.81)
RDW: 14.2 % (ref 11.5–15.5)
WBC: 2.4 10*3/uL — ABNORMAL LOW (ref 4.0–10.5)
nRBC: 0 % (ref 0.0–0.2)

## 2020-11-19 LAB — MAGNESIUM: Magnesium: 1.9 mg/dL (ref 1.7–2.4)

## 2020-11-19 NOTE — Plan of Care (Signed)

## 2020-11-19 NOTE — Progress Notes (Signed)
2876, patient refused vitals stated " come back at 0930 and I will do vitals, I want to sleep"

## 2020-11-19 NOTE — Progress Notes (Signed)
[facility] PROGRESS NOTE  Derek Woods GMW:[id number] DOB: [birth year] DOA: 10/07/2020 PCP: Pcp, No  Status: Remains inpatient appropriate because:Unsafe d/c plan, IV treatments appropriate due to intensity of illness or inability to take PO and Inpatient level of care appropriate due to severity of illness   Dispo: The patient is from: Home              Anticipated d/c is to: Home to live with mother who is disabled secondary to rheumatoid arthritis.  Post discharge transportation will be an issue.  This patient lives in the [place] area but will need to come to [facility] for ID clinic and given this we will also arrange for at least 1 PCP appointment after discharge in the [place] area.  He will be able to establish at a Suboxone clinic in [facility]              Patient currently is not medically stable to d/c.   Difficult to place patient Yes   Level of care: Med-Surg  Code Status: Full Family Communication: Patient DVT prophylaxis: Lovenox Vaccination status: Unknown   HPI: 30 year old male with past medical history of daily IV drug abuse with heroin and has used Suboxone from the streets.  He also has asthma, morbid obesity with a BMI of 52, daily smoker, suspected sleep apnea.  He presented to [facility] on 4/24 with back pain, right flank pain and shortness of breath x1 week.  CT of the chest consistent with large right empyema and he was admitted by the hospitalist team.  Subsequently taken to the OR on 4/26 for VATS and drainage of right empyema but due to severity of disease thoracotomy was completed instead.  He was a difficult intubation prior to the procedure and was unable to be weaned from the ventilator postoperatively.  There were also concerns of possible difficulty with appropriate oxygenation during the procedure.  He was subsequently admitted to the ICU under the care of PCCM.  Eventually extubated on 4/27 and transferred out of ICU on 4/28.  Posterior chest  tube removed 4/29.  Intraoperative cultures grew MRSA.  Antibiotics have been de-escalated to vancomycin.  ID consulted on 4/30.  MRI of thoracic spine was done and demonstrated T11/T12 discitis and possible abscess.  Neurosurgery was consulted and he subsequently underwent decompressive thoracic laminectomy on 5/2.  Incidental finding of hep C positive antibodies.   Subjective: Awaken from sleep.  States previous rawness and burning in mouth has resolved.  Objective: Vitals:   11/18/20 2341 11/19/20 0352  BP: (!) 142/80 (!) 146/98  Pulse: 85 79  Resp: 17 20  Temp: 98.4 F (36.9 C) 97.8 F (36.6 C)  SpO2: 98% 97%    Intake/Output Summary (Last 24 hours) at 11/19/2020 4403 Last data filed at 11/19/2020 4742 Gross per 24 hour  Intake 600 ml  Output --  Net 600 ml   Filed Weights   10/07/20 1006  Weight: (!) 146.7 kg    Exam:  Constitutional: Calm, comfortable Respiratory: Lungs are clear, room air Cardiovascular: Normal heart sounds, normotensive, no peripheral edema. Abdomen: LBM 6/1, soft.  Normoactive bowel sounds.  Tolerating diet. Neurologic: CN 2-12 grossly intact. Sensation intact, Strength 5/5 x all 4 extremities.  Ambulates with rolling walker. Psychiatric: Oriented x3   Assessment/Plan: Acute problems: Acute respiratory failure with hypercarbia and hypoxia (Resolved)/Right MRSA empyema s/p right thoracotomy on 10/09/2020 Vancomycin x 6 weeks with last dose due on 11/27/2020.  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   Follow-up lumbar MRI w/o evidence of infection.   Wound reassessed by neurosurgical team on 5/26.  Recommendations are to follow-up in their office after discharge Continue  Suboxone,Tylenol, naproxen, Robaxin, Lyrica  Essential hypertension New diagnosis- continue Norvasc   Polysubstance abuse/IVDU Continue Suboxone. Plan is to continue after discharge and follow-up at internal medicine Suboxone clinic in [place] which is a telemetry medicine clinic.  Physical deconditioning Improving and able to ambulate at times for short distances without rolling walker-HHPT  Morbid obesity BMI 52.2 kg/m  Follow-up with PCP regarding weight management strategies  Other problems: Positive hepatitis C antibody No known previous history of hepatitis C infection.   Antibodies positive.   Has follow-up appointment scheduled with [facility]  Anxiety Continue as needed hydroxyzine.  Normocytic anemia/Iron deficiency anemia Hemoglobin is stable over 9.   Continue PO iron  Tobacco dependence Smoking cessation counseling provided.   Data Reviewed: Basic Metabolic Panel: Recent Labs  Lab 11/13/20 0415 11/19/20 0349  NA 136 137  K 4.4 4.6  CL 102 104  CO2 26 26  GLUCOSE 166* 159*  BUN 19 18  CREATININE 0.87 0.80  CALCIUM 8.8* 9.1  MG  --  1.9   Liver Function Tests: No results for input(s): AST, ALT, ALKPHOS, BILITOT, PROT, ALBUMIN in the last 168 hours.   CBC: Recent Labs  Lab 11/13/20 0415 11/19/20 0349  WBC 2.9* 2.4*  HGB 10.4* 11.7*  HCT 32.9* 36.6*  MCV 95.6 94.8  PLT 204 271     Scheduled Meds: . acetaminophen  1,000 mg Oral TID  . amLODipine  5 mg Oral Daily  . buprenorphine-naloxone  2 tablet Sublingual BID  . Chlorhexidine Gluconate Cloth  6 each Topical Daily  . docusate sodium  100 mg Oral BID  . enoxaparin (LOVENOX) injection  70 mg Subcutaneous Q24H  . ferrous sulfate  325 mg Oral BID WC  . lidocaine  1 patch Transdermal Q24H  . mouth rinse  15 mL Mouth Rinse BID  . naproxen  250 mg Oral BID WC  . nicotine  14 mg Transdermal Daily  . nystatin  5 mL Oral QID  . pantoprazole  40 mg Oral QHS  . polyethylene glycol  17 g  Oral Daily  . pregabalin  50 mg Oral QHS  . senna-docusate  2 tablet Oral QHS  . sodium chloride flush  10-40 mL Intracatheter Q12H   Continuous Infusions: . sodium chloride 1,000 mL (11/16/20 2254)  . 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  S/p decompressive thoracic laminectomy T11-12 on 10/15/2020   Antibiotics:  Vancomycin 4/24 >>  Maxipime 4/24 through 4/28  Ancef 5/2 through 5/3   Time spent: 35    Junious Silk ANP  [facility] 7 am - 330 pm/M-F for direct patient care and secure chat Please refer to [facility]  for contact info 43  days

## 2020-11-19 NOTE — Progress Notes (Signed)
Patient refused vitals several times today, stated  "wants to sleep in until at least 0930" and to "inform night shift so that the next day shift RN knows"

## 2020-11-20 DIAGNOSIS — D702 Other drug-induced agranulocytosis: Secondary | ICD-10-CM | POA: Diagnosis not present

## 2020-11-20 LAB — CBC WITH DIFFERENTIAL/PLATELET
Abs Immature Granulocytes: 0 10*3/uL (ref 0.00–0.07)
Basophils Absolute: 0 10*3/uL (ref 0.0–0.1)
Basophils Relative: 0 %
Eosinophils Absolute: 0.2 10*3/uL (ref 0.0–0.5)
Eosinophils Relative: 9 %
HCT: 34.6 % — ABNORMAL LOW (ref 39.0–52.0)
Hemoglobin: 11 g/dL — ABNORMAL LOW (ref 13.0–17.0)
Lymphocytes Relative: 72 %
Lymphs Abs: 1.7 10*3/uL (ref 0.7–4.0)
MCH: 30.1 pg (ref 26.0–34.0)
MCHC: 31.8 g/dL (ref 30.0–36.0)
MCV: 94.5 fL (ref 80.0–100.0)
Monocytes Absolute: 0.1 10*3/uL (ref 0.1–1.0)
Monocytes Relative: 3 %
Myelocytes: 1 %
Neutro Abs: 0.4 10*3/uL — CL (ref 1.7–7.7)
Neutrophils Relative %: 15 %
Platelets: 279 10*3/uL (ref 150–400)
RBC: 3.66 MIL/uL — ABNORMAL LOW (ref 4.22–5.81)
RDW: 14.2 % (ref 11.5–15.5)
WBC: 2.4 10*3/uL — ABNORMAL LOW (ref 4.0–10.5)
nRBC: 0 % (ref 0.0–0.2)

## 2020-11-20 LAB — HEPATIC FUNCTION PANEL
ALT: 36 U/L (ref 0–44)
AST: 51 U/L — ABNORMAL HIGH (ref 15–41)
Albumin: 3.1 g/dL — ABNORMAL LOW (ref 3.5–5.0)
Alkaline Phosphatase: 87 U/L (ref 38–126)
Bilirubin, Direct: <0.1 mg/dL (ref 0.0–0.2)
Total Bilirubin: 0.3 mg/dL (ref 0.3–1.2)
Total Protein: 6.1 g/dL — ABNORMAL LOW (ref 6.5–8.1)

## 2020-11-20 LAB — BASIC METABOLIC PANEL
Anion gap: 6 (ref 5–15)
BUN: 18 mg/dL (ref 6–20)
CO2: 26 mmol/L (ref 22–32)
Calcium: 8.7 mg/dL — ABNORMAL LOW (ref 8.9–10.3)
Chloride: 103 mmol/L (ref 98–111)
Creatinine, Ser: 0.82 mg/dL (ref 0.61–1.24)
GFR, Estimated: >60 mL/min (ref >60)
Glucose, Bld: 155 mg/dL — ABNORMAL HIGH (ref 70–99)
Potassium: 4.4 mmol/L (ref 3.5–5.1)
Sodium: 135 mmol/L (ref 135–145)

## 2020-11-20 LAB — CBC
HCT: 40.5 % (ref 39.0–52.0)
Hemoglobin: 12.9 g/dL — ABNORMAL LOW (ref 13.0–17.0)
MCH: 30.1 pg (ref 26.0–34.0)
MCHC: 31.9 g/dL (ref 30.0–36.0)
MCV: 94.4 fL (ref 80.0–100.0)
Platelets: 226 10*3/uL (ref 150–400)
RBC: 4.29 MIL/uL (ref 4.22–5.81)
RDW: 14 % (ref 11.5–15.5)
WBC: 1.9 10*3/uL — ABNORMAL LOW (ref 4.0–10.5)
nRBC: 0 % (ref 0.0–0.2)

## 2020-11-20 NOTE — Progress Notes (Signed)
[facility] PROGRESS NOTE  Quill Grinder OXB:[id number] DOB: 08-Dec-1990 DOA: 10/07/2020 PCP: Pcp, No  Status: Remains inpatient appropriate because:Unsafe d/c plan, IV treatments appropriate due to intensity of illness or inability to take PO and Inpatient level of care appropriate due to severity of illness   Dispo: The patient is from: Home              Anticipated d/c is to: Home to live with mother who is disabled secondary to rheumatoid arthritis. He will be able to establish at a Suboxone clinic in [facility]              Patient currently is not medically stable to d/c.   Difficult to place patient Yes   Level of care: Med-Surg  Code Status: Full Family Communication: Patient DVT prophylaxis: Lovenox Vaccination status: Unknown   HPI: 30 year old male with past medical history of daily IV drug abuse with heroin and has used Suboxone from the streets.  He also has asthma, morbid obesity with a BMI of 52, daily smoker, suspected sleep apnea.  He presented to [facility] on 4/24 with back pain, right flank pain and shortness of breath x1 week.  CT of the chest consistent with large right empyema and he was admitted by the hospitalist team.  Subsequently taken to the OR on 4/26 for VATS and drainage of right empyema but due to severity of disease thoracotomy was completed instead.  He was a difficult intubation prior to the procedure and was unable to be weaned from the ventilator postoperatively.  There were also concerns of possible difficulty with appropriate oxygenation during the procedure.  He was subsequently admitted to the ICU under the care of PCCM.  Eventually extubated on 4/27 and transferred out of ICU on 4/28.  Posterior chest tube removed 4/29.  Intraoperative cultures grew MRSA.  Antibiotics have been de-escalated to vancomycin.  ID consulted on 4/30.  MRI of thoracic spine was done and demonstrated T11/T12 discitis and possible abscess.  Neurosurgery was consulted and  he subsequently underwent decompressive thoracic laminectomy on 5/2.  Incidental finding of hep C positive antibodies.   Subjective: Unintentionally awaken from sleep.  Went to examine patient and he waved me off and when I asked was it okay if I examine him he stated no.  Objective: Vitals:   11/19/20 2006 11/19/20 2339  BP: (!) 150/73 (!) 151/97  Pulse: 93 86  Resp: 18 20  Temp: 98.3 F (36.8 C) 98.6 F (37 C)  SpO2: 97% 96%    Intake/Output Summary (Last 24 hours) at 11/20/2020 0754 Last data filed at 11/20/2020 0300 Gross per 24 hour  Intake 1000 ml  Output --  Net 1000 ml   Filed Weights   10/07/20 1006  Weight: (!) 146.7 kg    Exam: **Refused physical exam today Constitutional:  Respiratory:  Cardiovascular:  Abdomen:  Neurologic:  Psychiatric:    Assessment/Plan: Acute problems: Acute respiratory failure with hypercarbia and hypoxia (Resolved)/Right MRSA empyema s/p right thoracotomy on 10/09/2020 Vancomycin x 6 weeks with last dose due on 11/27/2020.  Call ID prior to completion of antibiotics to determine if transition to oral antibiotics would be indicated  Progressive neutropenia  -WBCs previously normal and have been trending downward from 3.3 to a low today of 1.9. 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   Follow-up lumbar MRI w/o evidence of infection.   Wound reassessed by neurosurgical team on 5/26.  Recommendations are to follow-up in their office after discharge Continue Suboxone,Tylenol,  Robaxin, Lyrica 6/7 Naprosyn discontinued due to concerns over side effect of neutropenia  Essential hypertension New diagnosis- continue Norvasc   Polysubstance abuse/IVDU Continue Suboxone. Plan is to continue after discharge and follow-up at internal medicine Suboxone clinic in [place] which is a telemetry medicine clinic.  Physical deconditioning Improving and able to ambulate at times for short distances without rolling walker-HHPT  Morbid obesity BMI 52.2 kg/m  Follow-up with PCP regarding weight management strategies  Other problems: Positive hepatitis C antibody No known previous history of hepatitis C infection.   Antibodies positive.   Has follow-up appointment scheduled with [facility]  Anxiety Continue as needed hydroxyzine.  Normocytic anemia/Iron deficiency anemia Hemoglobin is stable over 9.   Continue PO iron  Tobacco dependence Smoking cessation counseling provided.   Data Reviewed: Basic Metabolic Panel: Recent Labs  Lab 11/19/20 0349 11/20/20 0251  NA 137 135  K 4.6 4.4  CL 104 103  CO2 26 26  GLUCOSE 159* 155*  BUN 18 18  CREATININE 0.80 0.82  CALCIUM 9.1 8.7*  MG 1.9  --    Liver Function Tests: No results for input(s): AST, ALT, ALKPHOS, BILITOT, PROT, ALBUMIN in the last 168 hours.   CBC: Recent Labs  Lab 11/19/20 0349 11/20/20 0251  WBC 2.4* 1.9*  HGB 11.7* 12.9*  HCT 36.6* 40.5  MCV 94.8 94.4  PLT 271 226     Scheduled Meds: . acetaminophen  1,000 mg Oral TID  . amLODipine  5 mg Oral Daily  . buprenorphine-naloxone  2 tablet Sublingual BID  . Chlorhexidine Gluconate Cloth  6 each Topical Daily  . docusate sodium  100 mg Oral BID  . enoxaparin (LOVENOX) injection  70 mg Subcutaneous Q24H  . ferrous sulfate  325 mg Oral BID WC  . lidocaine  1 patch Transdermal Q24H  . mouth rinse  15 mL Mouth Rinse BID  . nicotine  14 mg Transdermal Daily  . nystatin  5 mL Oral QID  . polyethylene glycol  17 g Oral Daily  .  pregabalin  50 mg Oral QHS  . senna-docusate  2 tablet Oral QHS  . sodium chloride flush  10-40 mL Intracatheter Q12H   Continuous Infusions: . sodium chloride 1,000 mL (11/16/20 2254)  . vancomycin Stopped (11/20/20 0300)    Principal Problem:   Empyema lung (HCC) Active Problems:   Polysubstance dependence including opioid type drug with complication, episodic abuse (HCC)   Morbid obesity with BMI of 50.0-59.9, adult (HCC)   Tobacco dependence   MRSA infection   Abscess in epidural space of thoracic spine   Consultants:  CT surgery  ID  Neurosurgery   Procedures:  Chest tube placement, thoracotomy and intubation  Right upper arm PICC line.  S/p decompressive thoracic laminectomy T11-12 on 10/15/2020   Antibiotics:  Vancomycin 4/24 >>  Maxipime 4/24 through 4/28  Ancef 5/2 through 5/3   Time spent: 35    Junious Silk ANP  [facility] 7 am - 330 pm/M-F for direct patient care and secure chat Please refer to Amion for contact info 44  days

## 2020-11-20 NOTE — Plan of Care (Signed)

## 2020-11-20 NOTE — Progress Notes (Signed)
Patient firmly desires no interruptions from staff from 0200-0930.   I explained that labs would have to be drawn this morning and patient agreed to labs but does not want his vitals taken per unit routine at 0400.

## 2020-11-21 DIAGNOSIS — B1710 Acute hepatitis C without hepatic coma: Secondary | ICD-10-CM

## 2020-11-21 LAB — CBC WITH DIFFERENTIAL/PLATELET
Abs Immature Granulocytes: 0 10*3/uL (ref 0.00–0.07)
Basophils Absolute: 0 10*3/uL (ref 0.0–0.1)
Basophils Relative: 1 %
Eosinophils Absolute: 0.2 10*3/uL (ref 0.0–0.5)
Eosinophils Relative: 11 %
HCT: 33.7 % — ABNORMAL LOW (ref 39.0–52.0)
Hemoglobin: 10.9 g/dL — ABNORMAL LOW (ref 13.0–17.0)
Immature Granulocytes: 0 %
Lymphocytes Relative: 67 %
Lymphs Abs: 1.5 10*3/uL (ref 0.7–4.0)
MCH: 30.3 pg (ref 26.0–34.0)
MCHC: 32.3 g/dL (ref 30.0–36.0)
MCV: 93.6 fL (ref 80.0–100.0)
Monocytes Absolute: 0.2 10*3/uL (ref 0.1–1.0)
Monocytes Relative: 7 %
Neutro Abs: 0.3 10*3/uL — CL (ref 1.7–7.7)
Neutrophils Relative %: 14 %
Platelets: 257 10*3/uL (ref 150–400)
RBC: 3.6 MIL/uL — ABNORMAL LOW (ref 4.22–5.81)
RDW: 14 % (ref 11.5–15.5)
WBC: 2.3 10*3/uL — ABNORMAL LOW (ref 4.0–10.5)
nRBC: 0 % (ref 0.0–0.2)

## 2020-11-21 LAB — COMPREHENSIVE METABOLIC PANEL
ALT: 37 U/L (ref 0–44)
AST: 46 U/L — ABNORMAL HIGH (ref 15–41)
Albumin: 3 g/dL — ABNORMAL LOW (ref 3.5–5.0)
Alkaline Phosphatase: 78 U/L (ref 38–126)
Anion gap: 7 (ref 5–15)
BUN: 21 mg/dL — ABNORMAL HIGH (ref 6–20)
CO2: 27 mmol/L (ref 22–32)
Calcium: 8.9 mg/dL (ref 8.9–10.3)
Chloride: 101 mmol/L (ref 98–111)
Creatinine, Ser: 0.78 mg/dL (ref 0.61–1.24)
GFR, Estimated: >60 mL/min (ref >60)
Glucose, Bld: 114 mg/dL — ABNORMAL HIGH (ref 70–99)
Potassium: 4.8 mmol/L (ref 3.5–5.1)
Sodium: 135 mmol/L (ref 135–145)
Total Bilirubin: 0.4 mg/dL (ref 0.3–1.2)
Total Protein: 5.6 g/dL — ABNORMAL LOW (ref 6.5–8.1)

## 2020-11-21 LAB — SEDIMENTATION RATE: Sed Rate: 20 mm/hr — ABNORMAL HIGH (ref 0–16)

## 2020-11-21 LAB — C-REACTIVE PROTEIN: CRP: 0.9 mg/dL (ref <1.0)

## 2020-11-21 LAB — PATHOLOGIST SMEAR REVIEW

## 2020-11-21 NOTE — Plan of Care (Signed)
  Problem: Education: Goal: Knowledge of General Education information will improve Description: Including pain rating scale, medication(s)/side effects and non-pharmacologic comfort measures 11/21/2020 1131 by Drue Dun, RN Outcome: Progressing 11/21/2020 0840 by Drue Dun, RN Outcome: Progressing   Problem: Health Behavior/Discharge Planning: Goal: Ability to manage health-related needs will improve 11/21/2020 1131 by Drue Dun, RN Outcome: Progressing 11/21/2020 0840 by Drue Dun, RN Outcome: Progressing   Problem: Clinical Measurements: Goal: Ability to maintain clinical measurements within normal limits will improve 11/21/2020 1131 by Drue Dun, RN Outcome: Progressing 11/21/2020 0840 by Drue Dun, RN Outcome: Progressing Goal: Will remain free from infection 11/21/2020 1131 by Drue Dun, RN Outcome: Progressing 11/21/2020 0840 by Drue Dun, RN Outcome: Progressing Goal: Diagnostic test results will improve 11/21/2020 1131 by Drue Dun, RN Outcome: Progressing 11/21/2020 0840 by Drue Dun, RN Outcome: Progressing Goal: Respiratory complications will improve 11/21/2020 1131 by Drue Dun, RN Outcome: Progressing 11/21/2020 0840 by Drue Dun, RN Outcome: Progressing Goal: Cardiovascular complication will be avoided 11/21/2020 1131 by Drue Dun, RN Outcome: Progressing 11/21/2020 0840 by Drue Dun, RN Outcome: Progressing   Problem: Activity: Goal: Risk for activity intolerance will decrease 11/21/2020 1131 by Drue Dun, RN Outcome: Progressing 11/21/2020 0840 by Drue Dun, RN Outcome: Progressing   Problem: Nutrition: Goal: Adequate nutrition will be maintained 11/21/2020 1131 by Drue Dun, RN Outcome: Progressing 11/21/2020 0840 by Drue Dun, RN Outcome: Progressing   Problem: Coping: Goal: Level of anxiety will decrease 11/21/2020 1131 by Drue Dun, RN Outcome:  Progressing 11/21/2020 0840 by Drue Dun, RN Outcome: Progressing   Problem: Elimination: Goal: Will not experience complications related to bowel motility 11/21/2020 1131 by Drue Dun, RN Outcome: Progressing 11/21/2020 0840 by Drue Dun, RN Outcome: Progressing Goal: Will not experience complications related to urinary retention 11/21/2020 1131 by Drue Dun, RN Outcome: Progressing 11/21/2020 0840 by Drue Dun, RN Outcome: Progressing   Problem: Pain Managment: Goal: General experience of comfort will improve 11/21/2020 1131 by Drue Dun, RN Outcome: Progressing 11/21/2020 0840 by Drue Dun, RN Outcome: Progressing   Problem: Safety: Goal: Ability to remain free from injury will improve 11/21/2020 1131 by Drue Dun, RN Outcome: Progressing 11/21/2020 0840 by Drue Dun, RN Outcome: Progressing   Problem: Skin Integrity: Goal: Risk for impaired skin integrity will decrease 11/21/2020 1131 by Drue Dun, RN Outcome: Progressing 11/21/2020 0840 by Drue Dun, RN Outcome: Progressing   Problem: Safety: Goal: Non-violent Restraint(s) 11/21/2020 1131 by Drue Dun, RN Outcome: Progressing 11/21/2020 0840 by Drue Dun, RN Outcome: Progressing

## 2020-11-21 NOTE — Progress Notes (Signed)
[facility] PROGRESS NOTE  Derek Woods DTO:[id number] DOB: 1991/04/23 DOA: 10/07/2020 PCP: Pcp, No  Status: Remains inpatient appropriate because:Unsafe d/c plan, IV treatments appropriate due to intensity of illness or inability to take PO and Inpatient level of care appropriate due to severity of illness   Dispo: The patient is from: Home              Anticipated d/c is to: Home to live with mother who is disabled secondary to rheumatoid arthritis. He will be able to establish at a Suboxone clinic in [facility]              Patient currently is not medically stable to d/c.   Difficult to place patient Yes   Level of care: Med-Surg  Code Status: Full Family Communication: Patient DVT prophylaxis: Lovenox Vaccination status: Unknown   HPI: 30 year old male with past medical history of daily IV drug abuse with heroin and has used Suboxone from the streets.  He also has asthma, morbid obesity with a BMI of 52, daily smoker, suspected sleep apnea.  He presented to [facility] on 4/24 with back pain, right flank pain and shortness of breath x1 week.  CT of the chest consistent with large right empyema and he was admitted by the hospitalist team.  Subsequently taken to the OR on 4/26 for VATS and drainage of right empyema but due to severity of disease thoracotomy was completed instead.  He was a difficult intubation prior to the procedure and was unable to be weaned from the ventilator postoperatively.  There were also concerns of possible difficulty with appropriate oxygenation during the procedure.  He was subsequently admitted to the ICU under the care of PCCM.  Eventually extubated on 4/27 and transferred out of ICU on 4/28.  Posterior chest tube removed 4/29.  Intraoperative cultures grew MRSA.  Antibiotics have been de-escalated to vancomycin.  ID consulted on 4/30.  MRI of thoracic spine was done and demonstrated T11/T12 discitis and possible abscess.  Neurosurgery was consulted and  he subsequently underwent decompressive thoracic laminectomy on 5/2.  Incidental finding of hep C positive antibodies.   Subjective: Awake.  No specific complaints.  Had questions regarding assistance with completing Medicaid and disability paperwork.  Objective: Vitals:   11/20/20 1055 11/20/20 1919  BP: 133/83 (!) 146/92  Pulse: 86 93  Resp: 18 18  Temp: 98.1 F (36.7 C) 98.7 F (37.1 C)  SpO2: 99% 97%    Intake/Output Summary (Last 24 hours) at 11/21/2020 0806 Last data filed at 11/21/2020 0548 Gross per 24 hour  Intake 420 ml  Output --  Net 420 ml   Filed Weights   10/07/20 1006  Weight: (!) 146.7 kg    Exam:  Constitutional: Calm, pleasant and in no acute distress Respiratory: Lungs are clear and stable on room air Cardiovascular: Normal heart sounds, no peripheral edema.  Regular pulse Abdomen: BS, soft, normoactive bowel sounds.  Eating well Neurologic: No focal neurological deficits.  Ambulates without difficulty Psychiatric: Alert and oriented x3.  Does not affect.   Assessment/Plan: Acute problems: Acute respiratory failure with hypercarbia and hypoxia (Resolved)/Right MRSA empyema s/p right thoracotomy on 10/09/2020 Vancomycin x 6 weeks with last dose due on 11/27/2020.  ID consulted.  Recommend after discharge continue Bactrim x1 month Patient has outpatient follow-up arranged with Dr. 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   Follow-up  lumbar MRI w/o evidence of infection.   Wound reassessed by neurosurgical team on 5/26.  Recommendations are to follow-up in their office after discharge Continue Suboxone,Tylenol, Robaxin, Lyrica 6/7 Naprosyn discontinued due to concerns over side effect of neutropenia  Essential hypertension New diagnosis- continue Norvasc   Polysubstance abuse/IVDU Continue Suboxone. Plan is to continue after discharge and follow-up at internal medicine Suboxone clinic in [place] which is a telemetry medicine clinic.  Physical deconditioning Improving and able to ambulate at times for short distances without rolling walker-HHPT  Morbid obesity BMI 52.2 kg/m  Follow-up with PCP regarding weight management strategies    Other problems: Positive hepatitis C antibody No known previous history of hepatitis C infection.   Antibodies positive.    But RNA negative so no active infection ID recommends continued screening as an outpatient as indicated  Anxiety Continue as needed hydroxyzine.  Normocytic anemia/Iron deficiency anemia Hemoglobin is stable over 9.   Continue PO iron  Tobacco dependence Smoking cessation counseling provided.   Data Reviewed: Basic Metabolic Panel: Recent Labs  Lab 11/19/20 0349 11/20/20 0251 11/21/20 0353  NA 137 135 135  K 4.6 4.4 4.8  CL 104 103 101  CO2 26 26 27   GLUCOSE 159* 155* 114*  BUN 18 18 21*  CREATININE 0.80 0.82 0.78  CALCIUM 9.1 8.7* 8.9  MG 1.9  --   --    Liver Function Tests: Recent Labs  Lab 11/20/20 0251 11/21/20 0353  AST 51* 46*  ALT 36 37  ALKPHOS 87 78  BILITOT 0.3 0.4  PROT 6.1* 5.6*  ALBUMIN 3.1* 3.0*     CBC: Recent Labs  Lab 11/19/20 0349 11/20/20 0251 11/20/20 0500 11/21/20 0353  WBC 2.4* 1.9* 2.4* 2.3*  NEUTROABS  --   --  0.4* 0.3*  HGB 11.7* 12.9* 11.0* 10.9*  HCT 36.6* 40.5 34.6* 33.7*  MCV 94.8 94.4 94.5 93.6  PLT 271 226 279 257     Scheduled Meds: . acetaminophen  1,000 mg Oral TID  .  amLODipine  5 mg Oral Daily  . buprenorphine-naloxone  2 tablet Sublingual BID  . Chlorhexidine Gluconate Cloth  6 each Topical Daily  . docusate sodium  100 mg Oral BID  . enoxaparin (LOVENOX) injection  70 mg Subcutaneous Q24H  . ferrous sulfate  325 mg Oral BID WC  . lidocaine  1 patch Transdermal Q24H  . mouth rinse  15 mL Mouth Rinse BID  . nicotine  14 mg Transdermal Daily  . nystatin  5 mL Oral QID  . polyethylene glycol  17 g Oral Daily  . pregabalin  50 mg Oral QHS  . senna-docusate  2 tablet Oral QHS  . sodium chloride flush  10-40 mL Intracatheter Q12H   Continuous Infusions: . sodium chloride 1,000 mL (11/16/20 2254)  . 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  S/p decompressive thoracic laminectomy T11-12 on 10/15/2020   Antibiotics:  Vancomycin 4/24 >>  Maxipime 4/24 through 4/28  Ancef 5/2 through 5/3   Time spent: 35    Junious Silk ANP  [facility] 7 am - 330 pm/M-F for direct patient care and secure chat Please refer to Amion for contact info 45  days

## 2020-11-21 NOTE — Progress Notes (Addendum)
TOC provided patient with paper Medicaid application.  Edwin Dada, MSW, LCSW Transitions of Care  Clinical Social Worker II [phone]

## 2020-11-21 NOTE — Progress Notes (Signed)
[facility] for Infectious Disease   Reason for visit: Follow up on thoracic discitis/osteomyelitis  Interval History: no acute events.  No fever.  Remains leukopenic.   Day 44 vancomycin Day 35 antibiotics since surgery  Physical Exam: Constitutional:  Vitals:   11/20/20 1919 11/21/20 1119  BP: (!) 146/92 (!) 142/99  Pulse: 93 93  Resp: 18 18  Temp: 98.7 F (37.1 C) 98.1 F (36.7 C)  SpO2: 97% 95%   patient appears in NAD Respiratory: Normal respiratory effort Cardiovascular: RRR Skin: no rashes  Review of Systems: Constitutional: negative for fevers and chills Gastrointestinal: negative for nausea and diarrhea Integument/breast: negative for rash  Lab Results  Component Value Date   WBC 2.3 (L) 11/21/2020   HGB 10.9 (L) 11/21/2020   HCT 33.7 (L) 11/21/2020   MCV 93.6 11/21/2020   PLT 257 11/21/2020    Lab Results  Component Value Date   CREATININE 0.78 11/21/2020   BUN 21 (H) 11/21/2020   NA 135 11/21/2020   K 4.8 11/21/2020   CL 101 11/21/2020   CO2 27 11/21/2020    Lab Results  Component Value Date   ALT 37 11/21/2020   AST 46 (H) 11/21/2020   ALKPHOS 78 11/21/2020     Microbiology: No results found for this or any previous visit (from the past 240 hour(s)).  Impression/Plan:  1. Thoracic discitis/osteomyelitis with phlegmon, s/p laminectomy by Dr. Saintclair Halsted on 10/18/20 - he is planned to complete 6 weeks of IV vancomcyin post surgery on 11/27/20 and it is ok from an ID standpoint to finish on 6/14 with the am dose and he can be discharged that day. We will provide him with oral continuation therapy for one month with Bactrim Will check his ESR and CRP  2.  Hepatitis C Ab positive - RNA is negative so no active infection.  Can continue with screening as an outpatient as indicated  3.  Empyema - breathing well, no concerns.    He has follow up arranged with Dr. West Bali 6/21 at 8:45 am We will provide oral antibiotics prior to discharge via [place]

## 2020-11-21 NOTE — Plan of Care (Signed)

## 2020-11-21 NOTE — TOC Progression Note (Signed)
Transition of Care [facility]) - Progression Note    Patient Details  Name: Derelle Cockrell MRN: [id number] Date of Birth: 1990/12/09  Transition of Care [facility]) CM/SW [place], RN Phone Number: 11/21/2020, 11:30 AM  Clinical Narrative:    Case management met with the patient at the bedside and patient was given a copy of the Medicaid application to fill out and mail to [place] in [facility].  Address was provided to the patient along with instructions.  The patient verbalizes understanding.  CM and MSW will continue to follow for discharge planning.   Expected Discharge Plan: Home/Self Care Barriers to Discharge: Continued Medical Work up,Inadequate or no insurance,Active Substance Use with PICC Line  Expected Discharge Plan and Services Expected Discharge Plan: Home/Self Care In-house Referral: Clinical Social [facility],Financial Counselor Discharge Planning Services: CM Consult,Indigent Health Clinic,Medication Assistance,Follow-up appt scheduled Post Acute Care Choice: [place] arrangements for the past 2 months: Apartment                 DME Arranged: 3-N-1,Walker rolling (Patient will need bariatric RW and 3:1 closer to discharge home from the hospital.) DME Agency: [employer]                   Social Determinants of Health ([place]) Interventions    Readmission Risk Interventions Readmission Risk Prevention Plan 10/30/2020  Transportation Screening Complete  PCP or Specialist Appt within 5-7 Days Complete  Home Care Screening Complete  Medication Review (RN CM) Complete

## 2020-11-22 LAB — ACID FAST CULTURE WITH REFLEXED SENSITIVITIES (MYCOBACTERIA)
Acid Fast Culture: NEGATIVE
Acid Fast Culture: NEGATIVE

## 2020-11-22 NOTE — Progress Notes (Signed)
[facility] PROGRESS NOTE  Harim Bi MBE:[id number] DOB: 27-Jan-1991 DOA: 10/07/2020 PCP: Pcp, No  Status: Remains inpatient appropriate because:Unsafe d/c plan, IV treatments appropriate due to intensity of illness or inability to take PO and Inpatient level of care appropriate due to severity of illness   Dispo: The patient is from: Home              Anticipated d/c is to: Home to live with mother who is disabled secondary to rheumatoid arthritis. He will be able to establish at a Suboxone clinic in [facility]              Patient currently is not medically stable to d/c.   Difficult to place patient Yes   Level of care: Med-Surg  Code Status: Full Family Communication: Patient DVT prophylaxis: Lovenox Vaccination status: Unknown   HPI: 30 year old male with past medical history of daily IV drug abuse with heroin and has used Suboxone from the streets.  He also has asthma, morbid obesity with a BMI of 52, daily smoker, suspected sleep apnea.  He presented to [facility] on 4/24 with back pain, right flank pain and shortness of breath x1 week.  CT of the chest consistent with large right empyema and he was admitted by the hospitalist team.  Subsequently taken to the OR on 4/26 for VATS and drainage of right empyema but due to severity of disease thoracotomy was completed instead.  He was a difficult intubation prior to the procedure and was unable to be weaned from the ventilator postoperatively.  There were also concerns of possible difficulty with appropriate oxygenation during the procedure.  He was subsequently admitted to the ICU under the care of PCCM.  Eventually extubated on 4/27 and transferred out of ICU on 4/28.  Posterior chest tube removed 4/29.  Intraoperative cultures grew MRSA.  Antibiotics have been de-escalated to vancomycin.  ID consulted on 4/30.  MRI of thoracic spine was done and demonstrated T11/T12 discitis and possible abscess.  Neurosurgery was consulted and  he subsequently underwent decompressive thoracic laminectomy on 5/2.  Incidental finding of hep C positive antibodies.   Subjective: Awakened.  Frustrated that he had been awakened.  No reports of any pain or other difficulties.  Objective: Vitals:   11/21/20 1119 11/21/20 2000  BP: (!) 142/99 130/79  Pulse: 93 96  Resp: 18 18  Temp: 98.1 F (36.7 C) 98.7 F (37.1 C)  SpO2: 95% 96%    Intake/Output Summary (Last 24 hours) at 11/22/2020 0757 Last data filed at 11/22/2020 0654 Gross per 24 hour  Intake 2412 ml  Output --  Net 2412 ml   Filed Weights   10/07/20 1006  Weight: (!) 146.7 kg    Exam:  Constitutional: Calm and in no acute distress Respiratory: Lungs are clear, supine in bed.  Room air iwithout increased work of breathing Cardiovascular: S1-S2, regular pulse, no peripheral edema Abdomen: Obese, soft and nondistended with normoactive bowel sounds.  Eating well. Neurologic: No focal neurological deficits.  Ambulates without difficulty Psychiatric: Oriented x3.   Assessment/Plan: Acute problems: Acute respiratory failure with hypercarbia and hypoxia (Resolved)/Right MRSA empyema s/p right thoracotomy on 10/09/2020 Vancomycin x 6 weeks with last dose due on 11/27/2020.  ID consulted.  Recommend after discharge continue Bactrim x1 month Patient has outpatient follow-up review at [facility] for HCV treatment appointment.  Follow-up regarding current treatment for osteomyelitis and epidural abscess can be completed during this admission as well.  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   Follow-up lumbar MRI w/o  evidence of infection.   Wound reassessed by neurosurgical team on 5/26.  Recommendations are to follow-up in their office after discharge Continue Suboxone,Tylenol, Robaxin, Lyrica 6/7 Naprosyn discontinued due to concerns over side effect of neutropenia  Essential hypertension New diagnosis- continue Norvasc   Polysubstance abuse/IVDU Continue Suboxone. Plan is to continue after discharge and follow-up at internal medicine Suboxone clinic in  which is a telemetry medicine clinic.  Physical deconditioning Improving and able to ambulate at times for short distances without rolling walker-HHPT   Morbid obesity BMI 52.2 kg/m  Follow-up with PCP regarding weight management strategies    Other problems: Positive hepatitis C antibody No known previous history of hepatitis C infection.   Antibodies positive.    But RNA negative so no active infection ID recommends continued screening as an outpatient as indicated  Anxiety Continue as needed hydroxyzine.  Normocytic anemia/Iron deficiency anemia Hemoglobin is stable over 9.   Continue PO iron   Tobacco dependence Smoking cessation counseling provided.    Data Reviewed: Basic Metabolic Panel: Recent Labs  Lab 11/19/20 0349 11/20/20 0251 11/21/20 0353  NA 137 135 135  K 4.6 4.4 4.8  CL 104 103 101  CO2 26 26 27   GLUCOSE 159* 155* 114*  BUN 18 18 21*  CREATININE 0.80 0.82 0.78  CALCIUM 9.1 8.7* 8.9  MG 1.9  --   --    Liver Function Tests: Recent Labs  Lab 11/20/20 0251 11/21/20 0353  AST 51* 46*  ALT 36 37  ALKPHOS 87 78  BILITOT 0.3 0.4  PROT 6.1* 5.6*  ALBUMIN 3.1* 3.0*     CBC: Recent Labs  Lab 11/19/20 0349 11/20/20 0251 11/20/20 0500 11/21/20 0353  WBC 2.4* 1.9* 2.4* 2.3*  NEUTROABS  --   --  0.4* 0.3*  HGB 11.7* 12.9* 11.0* 10.9*  HCT 36.6* 40.5 34.6* 33.7*  MCV 94.8 94.4 94.5 93.6  PLT 271 226 279 257     Scheduled Meds:  acetaminophen  1,000 mg Oral TID   amLODipine  5 mg  Oral Daily   buprenorphine-naloxone  2 tablet Sublingual BID   Chlorhexidine Gluconate Cloth  6 each Topical Daily   docusate sodium  100 mg Oral BID   enoxaparin (LOVENOX) injection  70 mg Subcutaneous Q24H   ferrous sulfate  325 mg Oral BID WC   lidocaine  1 patch Transdermal Q24H   mouth rinse  15 mL Mouth Rinse BID   nicotine  14 mg Transdermal Daily   nystatin  5 mL Oral QID   polyethylene glycol  17 g Oral Daily   pregabalin  50 mg Oral QHS   senna-docusate  2 tablet Oral QHS   sodium chloride flush  10-40 mL Intracatheter Q12H   Continuous Infusions:  sodium chloride 1,000 mL (11/16/20 2254)   vancomycin Stopped (11/21/20 2343)    Principal Problem:   Empyema lung (HCC) Active Problems:   Polysubstance dependence including opioid type drug with complication, episodic abuse (HCC)   Morbid obesity with BMI of 50.0-59.9, adult (HCC)   Tobacco dependence   MRSA infection   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 S/p decompressive thoracic laminectomy T11-12 on 10/15/2020    Antibiotics: Vancomycin 4/24 >> Maxipime 4/24 through 4/28 Ancef 5/2 through 5/3   Time spent: 15 minutes    Junious Silk ANP  [facility] 7 am - 330 pm/M-F for direct patient care and secure chat Please refer to Amion for contact info 46  days

## 2020-11-22 NOTE — Progress Notes (Signed)
Pt assessed to be in bed watching TV. Pt informs RN he has requested not be "interrupted and no vitals from midnight till about 0930 in the morning". Pt requested for writer to share with NT's and oncoming RN. Dionne Bucy RN

## 2020-11-23 LAB — VANCOMYCIN, TROUGH: Vancomycin Tr: 12 ug/mL — ABNORMAL LOW (ref 15–20)

## 2020-11-23 NOTE — Progress Notes (Signed)
[facility] PROGRESS NOTE  Najae Rathert CBS:[id number] DOB: 1990/10/18 DOA: 10/07/2020 PCP: Pcp, No  Status: Remains inpatient appropriate because:Unsafe d/c plan, IV treatments appropriate due to intensity of illness or inability to take PO and Inpatient level of care appropriate due to severity of illness   Dispo: The patient is from: Home              Anticipated d/c is to: Home to live with mother who is disabled secondary to rheumatoid arthritis. He will be able to establish at a Suboxone clinic in [facility]              Patient currently is not medically stable to d/c.   Difficult to place patient Yes   Level of care: Med-Surg  Code Status: Full Family Communication: Patient DVT prophylaxis: Lovenox Vaccination status: Unknown   HPI: 30 year old male with past medical history of daily IV drug abuse with heroin and has used Suboxone from the streets.  He also has asthma, morbid obesity with a BMI of 52, daily smoker, suspected sleep apnea.  He presented to [facility] on 4/24 with back pain, right flank pain and shortness of breath x1 week.  CT of the chest consistent with large right empyema and he was admitted by the hospitalist team.  Subsequently taken to the OR on 4/26 for VATS and drainage of right empyema but due to severity of disease thoracotomy was completed instead.  He was a difficult intubation prior to the procedure and was unable to be weaned from the ventilator postoperatively.  There were also concerns of possible difficulty with appropriate oxygenation during the procedure.  He was subsequently admitted to the ICU under the care of PCCM.  Eventually extubated on 4/27 and transferred out of ICU on 4/28.  Posterior chest tube removed 4/29.  Intraoperative cultures grew MRSA.  Antibiotics have been de-escalated to vancomycin.  ID consulted on 4/30.  MRI of thoracic spine was done and demonstrated T11/T12 discitis and possible abscess.  Neurosurgery was consulted and  he subsequently underwent decompressive thoracic laminectomy on 5/2.  Incidental finding of hep C positive antibodies.   Subjective: Patient sleeping upon entry into the room and I attempted not to awaken the patient.  Fortunately he did awaken.  Moments later another nurse came into the room.  We were discussing methods on how to limit staff members coming into the room especially in the overnight hours.  Patient interrupted by stating "you are the only person who appears to not be able to read that I do not want to be awakened until 9:30 AM."  I informed the patient that I would in the future place him last to be rounded on on my list and since I had already examined the patient I promptly left the room  Objective: Vitals:   11/22/20 2224 11/22/20 2226  BP: 138/80 137/77  Pulse: 88   Resp: 18   Temp: 98.6 F (37 C)   SpO2: 96%     Intake/Output Summary (Last 24 hours) at 11/23/2020 0745 Last data filed at 11/22/2020 1743 Gross per 24 hour  Intake 1417 ml  Output --  Net 1417 ml   Filed Weights   10/07/20 1006  Weight: (!) 146.7 kg    Exam:  Constitutional: Alert and frustrated over being awakened before his preferred time Respiratory: Lungs clear, stable on room air Cardiovascular: Normal heart sounds, no peripheral edema Abdomen: Soft and nontender and eating well Neurologic: No focal neurological deficits.  Ambulates  without difficulty Psychiatric: Alert and oriented x3   Assessment/Plan: Acute problems: Acute respiratory failure with hypercarbia and hypoxia (Resolved)/Right MRSA empyema s/p right thoracotomy on 10/09/2020 Vancomycin x 6 weeks with last dose due on 11/27/2020.  ID consulted.  Recommend after discharge continue Bactrim x1 month Patient has outpatient follow-up review at [facility] for HCV treatment appointment.  Follow-up regarding current treatment for osteomyelitis and epidural abscess can be completed during this admission as well.  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   Follow-up lumbar MRI w/o evidence of infection.   Wound reassessed by neurosurgical team on 5/26.  Recommendations are to follow-up in their office after discharge Continue Suboxone,Tylenol, Robaxin, Lyrica 6/7 Naprosyn discontinued due to concerns over side effect of neutropenia  Essential hypertension New diagnosis- continue Norvasc   Polysubstance abuse/IVDU Continue Suboxone. Plan is to continue after discharge and follow-up at internal medicine Suboxone clinic in [place] which is a telemetry medicine clinic.  Physical deconditioning Improving and able to ambulate at times for short distances without rolling walker-HHPT   Morbid obesity BMI 52.2 kg/m  Follow-up with PCP regarding weight management strategies    Other problems: Positive hepatitis C antibody No known previous history of hepatitis C infection.   Antibodies positive.    But RNA negative so no active infection ID recommends continued screening as an outpatient as indicated  Anxiety Continue as needed hydroxyzine.  Normocytic anemia/Iron deficiency anemia Hemoglobin is stable over 9.   Continue PO iron   Tobacco dependence Smoking cessation counseling provided.    Data Reviewed: Basic Metabolic Panel: Recent Labs  Lab 11/19/20 0349 11/20/20 0251 11/21/20 0353  NA 137 135 135  K 4.6 4.4 4.8  CL 104 103 101  CO2 26 26 27   GLUCOSE 159* 155* 114*  BUN 18 18 21*  CREATININE 0.80 0.82 0.78  CALCIUM 9.1 8.7* 8.9  MG 1.9  --   --    Liver Function Tests: Recent Labs  Lab 11/20/20 0251 11/21/20 0353  AST 51* 46*  ALT 36 37   ALKPHOS 87 78  BILITOT 0.3 0.4  PROT 6.1* 5.6*  ALBUMIN 3.1* 3.0*     CBC: Recent Labs  Lab 11/19/20 0349 11/20/20 0251 11/20/20 0500 11/21/20 0353  WBC 2.4* 1.9* 2.4* 2.3*  NEUTROABS  --   --  0.4* 0.3*  HGB 11.7* 12.9* 11.0* 10.9*  HCT 36.6* 40.5 34.6* 33.7*  MCV 94.8 94.4 94.5 93.6  PLT 271 226 279 257     Scheduled Meds:  acetaminophen  1,000 mg Oral TID   amLODipine  5 mg Oral Daily   buprenorphine-naloxone  2 tablet Sublingual BID   Chlorhexidine Gluconate Cloth  6 each Topical Daily   docusate sodium  100 mg Oral BID   enoxaparin (LOVENOX) injection  70 mg Subcutaneous Q24H   ferrous sulfate  325 mg Oral BID WC   lidocaine  1 patch Transdermal Q24H   mouth rinse  15 mL Mouth Rinse BID   nicotine  14 mg Transdermal Daily   nystatin  5 mL Oral QID   polyethylene glycol  17 g Oral Daily   pregabalin  50 mg Oral QHS   senna-docusate  2 tablet Oral QHS  sodium chloride flush  10-40 mL Intracatheter Q12H   Continuous Infusions:  sodium chloride 1,000 mL (11/16/20 2254)   vancomycin 1,000 mg (11/22/20 2237)    Principal Problem:   Empyema lung (HCC) Active Problems:   Polysubstance dependence including opioid type drug with complication, episodic abuse (HCC)   Morbid obesity with BMI of 50.0-59.9, adult (HCC)   Tobacco dependence   MRSA infection   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 S/p decompressive thoracic laminectomy T11-12 on 10/15/2020    Antibiotics: Vancomycin 4/24 >> Maxipime 4/24 through 4/28 Ancef 5/2 through 5/3   Time spent: 15 minutes    Junious Silk ANP  [facility] 7 am - 330 pm/M-F for direct patient care and secure chat Please refer to Amion for contact info 47  days

## 2020-11-23 NOTE — Progress Notes (Signed)
Patient refused staff coming to his room after 0100 am.

## 2020-11-23 NOTE — Progress Notes (Signed)
Pharmacy Antibiotic Note  Derek Woods is a 30 y.o. male admitted on 10/07/2020 with  Osteo .  Pharmacy was consulted on 10/07/20 for Vancomycin dosing.   R empyema, T11-12 discitis with MRSA osteo. HepC positive antibodies. Afebrile. WBC down to 2.9 on 5/31.. - 4/26 s/p thoracotomy ( mini thora VATS) - 5/2 s/p laminectomy/surg decompression - hemovac out  - 4/30 MRI epidural abscess/discitis/osteo T11-12  4/24 cefepime >>4/28 4/24 Vanc >> 6/13     4/28 VT 30 - held vanc 4/29 VR 11 - vanc 2g q24h (ke 0.062, half life 11.2hr > est AUC 487) 5/4 VP 40, VT 8, AUC 575 on 2g q24h >> adjust to 1000mg  q12h (eAUC 575) 5/11 VT 12 - > continue 1 gram Q 12 5/17 VP 30,  VT 10,  calculated AUC 462.8, continue 1g q12h 5/26 VT 12 -> same as other values 6/2 VT 13 - > no change in dose 6/9 VT 12 -> no change in dose  4/24 BCx: collected prior to abx: neg 4/25 MRSA PCR +  4/26 soft tissue Cx: MRSA 4/26 pleural fluid: MRSA 5/2 epidural abscess: rare MRSA  Plan: Continue vancomycin 1000mg  IV q12h  Vanc for osteo, end date 6/13 (treat in hospital, 6 wks from 5/2l) Weekly levels - next 6/17   Height: 5\' 6"  (167.6 cm) Weight: (!) 146.7 kg (323 lb 6.4 oz) IBW/kg (Calculated) : 63.8  Temp (24hrs), Avg:98.8 F (37.1 C), Min:98.6 F (37 C), Max:98.9 F (37.2 C)  Recent Labs  Lab 11/19/20 0349 11/20/20 0251 11/20/20 0500 11/21/20 0353 11/23/20 0809  WBC 2.4* 1.9* 2.4* 2.3*  --   CREATININE 0.80 0.82  --  0.78  --   VANCOTROUGH  --   --   --   --  12*     Estimated Creatinine Clearance: 183.6 mL/min (by C-G formula based on SCr of 0.78 mg/dL).    Allergies  Allergen Reactions   Augmentin [Amoxicillin-Pot Clavulanate] Other (See Comments)    Pt does not recall, childhood   Ceclor [Cefaclor] Other (See Comments)    Pt does not recall, childhood   Sulfa Antibiotics Hives   Thank you for involving pharmacy in this patient's care.  01/20/21, PharmD, BCPS Clinical  Pharmacist Clinical phone for 11/23/2020 until 3p is [phone] 11/23/2020 12:39 PM  **Pharmacist phone directory can be found on [email or web address] listed under [facility]**

## 2020-11-24 NOTE — Progress Notes (Signed)
[facility] PROGRESS NOTE  Derek Woods IHK:[id number] DOB: 07-May-1991 DOA: 10/07/2020 PCP: Pcp, No   HPI: 30 year old male with past medical history of daily IV drug abuse with heroin and has used Suboxone from the streets.  He also has asthma, morbid obesity with a BMI of 52, daily smoker, suspected sleep apnea.  He presented to [facility] on 4/24 with back pain, right flank pain and shortness of breath x1 week.  CT of the chest consistent with large right empyema and he was admitted by the hospitalist team.   Subsequently, patient was taken to the OR on 4/26 for VATS and drainage of right empyema but due to severity of disease thoracotomy was completed instead.  He was a difficult intubation prior to the procedure and was unable to be weaned from the ventilator postoperatively.  There were also concerns of possible difficulty with appropriate oxygenation during the procedure.  He was subsequently admitted to the ICU under the care of PCCM.  Eventually extubated on 4/27 and transferred out of ICU on 4/28.  Posterior chest tube removed 4/29.  Intraoperative cultures grew MRSA.  Antibiotics have been de-escalated to vancomycin.  ID consulted on 4/30.  MRI of thoracic spine was done and demonstrated T11/T12 discitis and possible abscess.  Neurosurgery was consulted and he subsequently underwent decompressive thoracic laminectomy on 5/2.  Incidental finding of hep C positive antibodies.   At this time, patient is in the hospital due to need for ongoing IV antibiotics and difficult disposition.     Subjective: Today, patient was seen and examined at bedside.  Denies interval complaints.  Denies any nausea vomiting fever chills or rigor.     Objective: Vitals:   11/23/20 2015 11/24/20 1041  BP: 108/72 124/84  Pulse:  89  Resp:  16  Temp:  97.9 F (36.6 C)  SpO2: 95% 100%   No intake or output data in the 24 hours ending 11/24/20 1323  Filed Weights   10/07/20 1006  Weight:  (!) 146.7 kg    Physical exam:  General: Obese built, not in obvious distress HENT:   No scleral pallor or icterus noted. Oral mucosa is moist.  Chest:    Diminished breath sounds bilaterally. No crackles or wheezes.  CVS: S1 &S2 heard. No murmur.  Regular rate and rhythm. Abdomen: Soft, nontender, nondistended.  Bowel sounds are heard.   Extremities: No cyanosis, clubbing or edema.  Peripheral pulses are palpable. Psych: Alert, awake and oriented, normal mood CNS:  No cranial nerve deficits.  Power equal in all extremities.   Skin: Warm and dry.  No rashes noted.    Assessment/Plan:   Acute respiratory failure with hypercarbia and hypoxia /Right MRSA empyema s/p right thoracotomy on 10/09/2020 Currently on IV vancomycin.  Continue vancomycin last dose on 11/27/2020.    Suspected OSA/OHS Will benefit from formal outpatient sleep study testing.     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   Follow-up lumbar MRI w/o evidence of infection.    Neurosurgery recommends outpatient follow-up.  Continue with pain management.  Currently on Suboxone Tylenol nd p.o. Toradol.  Continue Robaxin, lidocaine and Lyrica as well.  Naproxen was discontinued due to neutropenia.  Neutropenia.  Continue to monitor closely.  Naproxen was discontinued.   Essential hypertension Norvasc initiated this hospitalization.  Blood pressure is stable   Polysubstance abuse/IVDU Continue Suboxone.  Plans to follow-up at the Suboxone clinic on discharge.   Physical deconditioning PT has recommended home  health PT on discharge.  Continue physical therapy while in the hospital   Morbid obesity Would benefit from outpatient efforts on weight loss.   Positive hepatitis C antibody No known previous history of hepatitis C infection.  Antibodies positive.  ID will follow up HCV RNA and have made a follow-up appointment at [facility] for HCV treatment on 6/21 at  8:45 AM.   Anxiety Continue as needed hydroxyzine.   Normocytic anemia/Iron deficiency anemia Hemoglobin has remained stable.  On p.o. iron.  Check CBC in AM.   Tobacco dependence Not on nicotine patch.    Status: Remains inpatient appropriate because:Unsafe d/c plan, IV treatments appropriate due to intensity of illness or inability to take PO and Inpatient level of care appropriate due to severity of illness   Dispo: The patient is from: Home              Anticipated d/c is to: Home to live with mother who is disabled secondary to rheumatoid arthritis. He will be able to establish at a Suboxone clinic in [facility]              Patient currently is not medically stable to d/c.   Difficult to place patient Yes   Level of care: Med-Surg  Code Status: Full  Family Communication: Patient  DVT prophylaxis: Lovenox   Data Reviewed: Basic Metabolic Panel: Recent Labs  Lab 11/19/20 0349 11/20/20 0251 11/21/20 0353  NA 137 135 135  K 4.6 4.4 4.8  CL 104 103 101  CO2 26 26 27   GLUCOSE 159* 155* 114*  BUN 18 18 21*  CREATININE 0.80 0.82 0.78  CALCIUM 9.1 8.7* 8.9  MG 1.9  --   --     Liver Function Tests: Recent Labs  Lab 11/20/20 0251 11/21/20 0353  AST 51* 46*  ALT 36 37  ALKPHOS 87 78  BILITOT 0.3 0.4  PROT 6.1* 5.6*  ALBUMIN 3.1* 3.0*      CBC: Recent Labs  Lab 11/19/20 0349 11/20/20 0251 11/20/20 0500 11/21/20 0353  WBC 2.4* 1.9* 2.4* 2.3*  NEUTROABS  --   --  0.4* 0.3*  HGB 11.7* 12.9* 11.0* 10.9*  HCT 36.6* 40.5 34.6* 33.7*  MCV 94.8 94.4 94.5 93.6  PLT 271 226 279 257      Scheduled Meds:  acetaminophen  1,000 mg Oral TID   amLODipine  5 mg Oral Daily   buprenorphine-naloxone  2 tablet Sublingual BID   Chlorhexidine Gluconate Cloth  6 each Topical Daily   docusate sodium  100 mg Oral BID   enoxaparin (LOVENOX) injection  70 mg Subcutaneous Q24H   ferrous sulfate  325 mg Oral BID WC   lidocaine  1 patch Transdermal Q24H   mouth  rinse  15 mL Mouth Rinse BID   nicotine  14 mg Transdermal Daily   nystatin  5 mL Oral QID   polyethylene glycol  17 g Oral Daily   pregabalin  50 mg Oral QHS   senna-docusate  2 tablet Oral QHS   sodium chloride flush  10-40 mL Intracatheter Q12H   Continuous Infusions:  sodium chloride 1,000 mL (11/16/20 2254)   vancomycin 1,000 mg (11/24/20 1044)    Principal Problem:   Empyema lung (HCC) Active Problems:   Polysubstance dependence including opioid type drug with complication, episodic abuse (HCC)   Morbid obesity with BMI of 50.0-59.9, adult (HCC)   Tobacco dependence   MRSA infection   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 S/p decompressive thoracic laminectomy T11-12 on 10/15/2020    Antibiotics: Vancomycin 4/24 >> Maxipime 4/24 through 4/28 Ancef 5/2 through 5/3   Derek Das, MD [facility] 48  days

## 2020-11-24 NOTE — Progress Notes (Signed)
Pt stated that he did not want to be woken up during the night , no night time V/S and 4 am V/S, states he will call at the nurse's station if he needs anything, but no rounding on him.

## 2020-11-25 NOTE — Progress Notes (Signed)
PROGRESS NOTE    Derek Woods  GUY:[id number] DOB: May 08, 1991 DOA: 10/07/2020 PCP: Pcp, No (   No chief complaint on file.   Brief Narrative:  30 year old male with past medical history of daily IV drug abuse with heroin and has used Suboxone from the streets.  He also has asthma, morbid obesity with a BMI of 52, daily smoker, suspected sleep apnea.  He presented to [facility] on 4/24 with back pain, right flank pain and shortness of breath x1 week.  CT of the chest consistent with large right empyema and he was admitted by the hospitalist team.   Subsequently, patient was taken to the OR on 4/26 for VATS and drainage of right empyema but due to severity of disease thoracotomy was completed instead.  He was a difficult intubation prior to the procedure and was unable to be weaned from the ventilator postoperatively.  There were also concerns of possible difficulty with appropriate oxygenation during the procedure.  He was subsequently admitted to the ICU under the care of PCCM.  Eventually extubated on 4/27 and transferred out of ICU on 4/28.  Posterior chest tube removed 4/29.  Intraoperative cultures grew MRSA.  Antibiotics have been de-escalated to vancomycin.  ID consulted on 4/30.  MRI of thoracic spine was done and demonstrated T11/T12 discitis and possible abscess.  Neurosurgery was consulted and he subsequently underwent decompressive thoracic laminectomy on 5/2.  Incidental finding of hep C positive antibodies.   At this time, patient is in the hospital due to need for ongoing IV antibiotics and difficult disposition.    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  Continue vancomycin last dose on 11/27/2020.     Suspected OSA/OHS Will benefit from formal outpatient sleep study testing.     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   Follow-up lumbar MRI w/o evidence of infection.    Neurosurgery recommends outpatient follow-up.  Continue with pain management.  Currently on Suboxone Tylenol nd p.o. Toradol.  Continue Robaxin, lidocaine and Lyrica as well.  Naproxen was discontinued due to neutropenia.   Neutropenia.  Continue to monitor closely.  Naproxen was discontinued.   Essential hypertension Norvasc initiated this hospitalization.  Blood pressure is stable   Polysubstance abuse/IVDU Continue Suboxone.  Plans to follow-up at the Suboxone clinic on discharge.   Physical deconditioning PT has recommended home health PT on discharge.  Continue physical therapy while in the hospital   Morbid obesity Would benefit from outpatient efforts on weight loss.   Positive hepatitis C antibody No known previous history of hepatitis C infection.  Antibodies positive.  ID will follow up HCV RNA and have made a follow-up appointment at [facility] for HCV treatment on 6/21 at 8:45 AM.   Anxiety Continue as needed hydroxyzine.   Normocytic anemia/Iron deficiency anemia Hemoglobin has remained stable.  On p.o. iron.  Check CBC in AM.   Tobacco dependence Not on nicotine patch.         DVT prophylaxis: (Lovenox.  Code Status: (Full code.  Family Communication: none  at bedside.  Disposition:   Status is: Inpatient  Remains inpatient appropriate because:Unsafe d/c plan and IV treatments appropriate due to intensity of illness or inability to take PO  Dispo: The patient is from: Home              Anticipated d/c is to: Home              Patient currently is not  medically stable to d/c.   Difficult to place patient No       Consultants:  ID  Procedures:   Antimicrobials: vancomycin.    Subjective: No new complaints. Looking for ward to going home when antibiotics are completed.   Objective: Vitals:   11/23/20 2015 11/24/20 1041 11/24/20 2328 11/25/20 1008  BP: 108/72 124/84 (!) 138/98 112/66  Pulse:  89 94   Resp:  16 17   Temp: 98 F (36.7 C) 97.9 F (36.6 C) 98.9 F (37.2 C)   TempSrc: Oral Oral Oral   SpO2: 95% 100% 96%   Weight:      Height:        Intake/Output Summary (Last 24 hours) at 11/25/2020 1818 Last data filed at 11/25/2020 1800 Gross per 24 hour  Intake 1000 ml  Output --  Net 1000 ml   Filed Weights   10/07/20 1006  Weight: (!) 146.7 kg    Examination:  General exam: Appears calm and comfortable  Respiratory system: Clear to auscultation. Respiratory effort normal. Cardiovascular system: S1 & S2 heard, RRR. No JVD, No pedal edema. Gastrointestinal system: Abdomen is nondistended, soft and nontender. Normal bowel sounds heard. Central nervous system: Alert and oriented. No focal neurological deficits. Extremities:  pedal edema present. Skin: No rashes, lesions or ulcers Psychiatry: Mood & affect appropriate.     Data Reviewed: I have personally reviewed following labs and imaging studies  CBC: Recent Labs  Lab 11/19/20 0349 11/20/20 0251 11/20/20 0500 11/21/20 0353  WBC 2.4* 1.9* 2.4* 2.3*  NEUTROABS  --   --  0.4* 0.3*  HGB 11.7* 12.9* 11.0* 10.9*  HCT 36.6* 40.5 34.6* 33.7*  MCV 94.8 94.4 94.5 93.6  PLT 271 226 279 257    Basic Metabolic Panel: Recent Labs  Lab 11/19/20 0349 11/20/20 0251 11/21/20 0353  NA 137 135 135  K 4.6 4.4 4.8  CL 104 103 101  CO2 26 26 27   GLUCOSE 159* 155* 114*  BUN 18 18 21*  CREATININE 0.80 0.82 0.78  CALCIUM 9.1 8.7* 8.9  MG 1.9  --   --     GFR: Estimated Creatinine Clearance: 183.6 mL/min (by C-G formula based on SCr of 0.78  mg/dL).  Liver Function Tests: Recent Labs  Lab 11/20/20 0251 11/21/20 0353  AST 51* 46*  ALT 36 37  ALKPHOS 87 78  BILITOT 0.3 0.4  PROT 6.1* 5.6*  ALBUMIN 3.1* 3.0*    CBG: No results for input(s): GLUCAP in the last 168 hours.   No results found for this or any previous visit (from the past 240 hour(s)).       Radiology Studies: No results found.      Scheduled Meds:  acetaminophen  1,000 mg Oral TID   amLODipine  5 mg Oral Daily   buprenorphine-naloxone  2 tablet Sublingual BID   Chlorhexidine Gluconate Cloth  6 each Topical Daily   docusate sodium  100 mg Oral BID   enoxaparin (LOVENOX) injection  70 mg Subcutaneous Q24H   ferrous sulfate  325 mg Oral  BID WC   lidocaine  1 patch Transdermal Q24H   mouth rinse  15 mL Mouth Rinse BID   nicotine  14 mg Transdermal Daily   nystatin  5 mL Oral QID   polyethylene glycol  17 g Oral Daily   pregabalin  50 mg Oral QHS   senna-docusate  2 tablet Oral QHS   sodium chloride flush  10-40 mL Intracatheter Q12H   Continuous Infusions:  sodium chloride 1,000 mL (11/16/20 2254)   vancomycin Stopped (11/25/20 1300)     LOS: 49 days        Kathlen Mody, MD [facility]   To contact the attending provider between 7A-7P or the covering provider during after hours 7P-7A, please log into the web site [email or web address] and access using universal [place] password for that web site. If you do not have the password, please call the hospital operator.  11/25/2020, 6:18 PM

## 2020-11-26 ENCOUNTER — Other Ambulatory Visit (HOSPITAL_COMMUNITY): Payer: Self-pay

## 2020-11-26 MED ORDER — FERROUS SULFATE 325 (65 FE) MG PO TABS
325.0000 mg | ORAL_TABLET | Freq: Two times a day (BID) | ORAL | 3 refills | Status: DC
  Filled 2020-11-26: qty 60, 30d supply, fill #0

## 2020-11-26 MED ORDER — PREGABALIN 50 MG PO CAPS
50.0000 mg | ORAL_CAPSULE | Freq: Every day | ORAL | 1 refills | Status: DC
  Filled 2020-11-26: qty 30, 30d supply, fill #0

## 2020-11-26 MED ORDER — LINEZOLID 600 MG PO TABS
600.0000 mg | ORAL_TABLET | Freq: Two times a day (BID) | ORAL | 0 refills | Status: AC
  Filled 2020-11-26: qty 28, 14d supply, fill #0

## 2020-11-26 MED ORDER — BUPRENORPHINE HCL-NALOXONE HCL 2-0.5 MG SL SUBL
2.0000 | SUBLINGUAL_TABLET | Freq: Two times a day (BID) | SUBLINGUAL | 0 refills | Status: AC
  Filled 2020-11-26: qty 28, 7d supply, fill #0

## 2020-11-26 MED ORDER — DOCUSATE SODIUM 100 MG PO CAPS
100.0000 mg | ORAL_CAPSULE | Freq: Two times a day (BID) | ORAL | 0 refills | Status: DC
  Filled 2020-11-26: qty 10, 5d supply, fill #0

## 2020-11-26 MED ORDER — HYDROXYZINE HCL 10 MG PO TABS
10.0000 mg | ORAL_TABLET | Freq: Three times a day (TID) | ORAL | 0 refills | Status: DC | PRN
  Filled 2020-11-26: qty 30, 10d supply, fill #0

## 2020-11-26 MED ORDER — METHOCARBAMOL 500 MG PO TABS
500.0000 mg | ORAL_TABLET | Freq: Four times a day (QID) | ORAL | 0 refills | Status: DC | PRN
  Filled 2020-11-26: qty 45, 12d supply, fill #0

## 2020-11-26 MED ORDER — ACETAMINOPHEN 500 MG PO TABS
1000.0000 mg | ORAL_TABLET | Freq: Three times a day (TID) | ORAL | 0 refills | Status: DC
Start: 2020-11-26 — End: 2020-11-26
  Filled 2020-11-26: qty 30, 5d supply, fill #0

## 2020-11-26 MED ORDER — AMLODIPINE BESYLATE 5 MG PO TABS
5.0000 mg | ORAL_TABLET | Freq: Every day | ORAL | 0 refills | Status: DC
  Filled 2020-11-26: qty 30, 30d supply, fill #0

## 2020-11-26 MED ORDER — NICOTINE 14 MG/24HR TD PT24
14.0000 mg | MEDICATED_PATCH | Freq: Every day | TRANSDERMAL | 0 refills | Status: DC
  Filled 2020-11-26: qty 28, 28d supply, fill #0

## 2020-11-26 MED ORDER — POLYETHYLENE GLYCOL 3350 17 GM/SCOOP PO POWD
17.0000 g | Freq: Every day | ORAL | 0 refills | Status: DC
  Filled 2020-11-26: qty 510, 30d supply, fill #0

## 2020-11-26 MED ORDER — SENNOSIDES-DOCUSATE SODIUM 8.6-50 MG PO TABS
2.0000 | ORAL_TABLET | Freq: Every day | ORAL | 1 refills | Status: DC
  Filled 2020-11-26: qty 60, 30d supply, fill #0

## 2020-11-26 NOTE — Progress Notes (Signed)
Pharmacy Antibiotic Note  Derek Woods is a 30 y.o. male admitted on 10/07/2020 with  Osteo .  Pharmacy was consulted on 10/07/20 for Vancomycin dosing.   R empyema, T11-12 discitis with MRSA osteo. HepC positive antibodies. Afebrile. WBC down to 2.9 on 5/31.. - 4/26 s/p thoracotomy ( mini thora VATS) - 5/2 s/p laminectomy/surg decompression - hemovac out  - 4/30 MRI epidural abscess/discitis/osteo T11-12  4/24 cefepime >>4/28 4/24 Vanc >> 6/14    4/28 VT 30 - held vanc 4/29 VR 11 - vanc 2g q24h (ke 0.062, half life 11.2hr > est AUC 487) 5/4 VP 40, VT 8, AUC 575 on 2g q24h >> adjust to 1000mg  q12h (eAUC 575) 5/11 VT 12 - > continue 1 gram Q 12 5/17 VP 30,  VT 10,  calculated AUC 462.8, continue 1g q12h 5/26 VT 12 -> same as other values 6/2 VT 13 - > no change in dose 6/9 VT 12 -> no change in dose  4/24 BCx: collected prior to abx: neg 4/25 MRSA PCR +  4/26 soft tissue Cx: MRSA 4/26 pleural fluid: MRSA 5/2 epidural abscess: rare MRSA  Plan: Continue vancomycin 1000mg  IV q12h  Vanc for osteo, end date 6/14 (treat in hospital, 6 wks from 5/2l) Weekly levels - next 6/17   Height: 5\' 6"  (167.6 cm) Weight: (!) 146.7 kg (323 lb 6.4 oz) IBW/kg (Calculated) : 63.8  Temp (24hrs), Avg:98.7 F (37.1 C), Min:98.7 F (37.1 C), Max:98.7 F (37.1 C)  Recent Labs  Lab 11/20/20 0251 11/20/20 0500 11/21/20 0353 11/23/20 0809  WBC 1.9* 2.4* 2.3*  --   CREATININE 0.82  --  0.78  --   VANCOTROUGH  --   --   --  12*     Estimated Creatinine Clearance: 183.6 mL/min (by C-G formula based on SCr of 0.78 mg/dL).    Allergies  Allergen Reactions   Augmentin [Amoxicillin-Pot Clavulanate] Other (See Comments)    Pt does not recall, childhood   Ceclor [Cefaclor] Other (See Comments)    Pt does not recall, childhood   Sulfa Antibiotics Hives   Thank you for involving pharmacy in this patient's care.  01/20/21, PharmD, BCPS Clinical Pharmacist Clinical phone for 11/26/2020  until 3p is [phone] 11/26/2020 10:39 AM  **Pharmacist phone directory can be found on [email or web address] listed under [facility]**

## 2020-11-26 NOTE — Progress Notes (Addendum)
[facility] PROGRESS NOTE  Braison Snoke GEZ:[id number] DOB: 04-12-1991 DOA: 10/07/2020 PCP: Pcp, No  Status: Remains inpatient appropriate because:Unsafe d/c plan, IV treatments appropriate due to intensity of illness or inability to take PO and Inpatient level of care appropriate due to severity of illness   Dispo: The patient is from: Home              Anticipated d/c is to: Home  6/14              Patient currently is not medically stable to d/c.   Difficult to place patient Yes   Level of care: Med-Surg  Code Status: Full Family Communication: Patient DVT prophylaxis: Lovenox Vaccination status: Unknown   HPI: 30 year old male with past medical history of daily IV drug abuse with heroin and has used Suboxone from the streets.  He also has asthma, morbid obesity with a BMI of 52, daily smoker, suspected sleep apnea.  He presented to [facility] on 4/24 with back pain, right flank pain and shortness of breath x1 week.  CT of the chest consistent with large right empyema and he was admitted by the hospitalist team.  Subsequently taken to the OR on 4/26 for VATS and drainage of right empyema but due to severity of disease thoracotomy was completed instead.  He was a difficult intubation prior to the procedure and was unable to be weaned from the ventilator postoperatively.  There were also concerns of possible difficulty with appropriate oxygenation during the procedure.  He was subsequently admitted to the ICU under the care of PCCM.  Eventually extubated on 4/27 and transferred out of ICU on 4/28.  Posterior chest tube removed 4/29.  Intraoperative cultures grew MRSA.  Antibiotics have been de-escalated to vancomycin.  ID consulted on 4/30.  MRI of thoracic spine was done and demonstrated T11/T12 discitis and possible abscess.  Neurosurgery was consulted and he subsequently underwent decompressive thoracic laminectomy on 5/2.  Incidental finding of hep C positive  antibodies.   Subjective: Alert.  Sitting on side of the bed.  Eager to discharge home tomorrow.  Up with Suboxone clinic.  He reports that he will need to talk with his brother to get the information about the clinic, download the app and then arrange for an appointment.  He reports he should be able to get an appointment within 1 week after discharge.  Objective: Vitals:   11/25/20 1008 11/25/20 2154  BP: 112/66 133/76  Pulse:  98  Resp:  14  Temp:  98.7 F (37.1 C)  SpO2:  97%    Intake/Output Summary (Last 24 hours) at 11/26/2020 0805 Last data filed at 11/25/2020 1800 Gross per 24 hour  Intake 1000 ml  Output --  Net 1000 ml   Filed Weights   10/07/20 1006  Weight: (!) 146.7 kg    Exam:  Constitutional: No acute distress, calm, sitting on side of bed Respiratory: Lung sounds are clear to auscultation, remained stable on room air Cardiovascular: S1-S2, no peripheral edema, regular pulse and normotensive Abdomen: Obese, soft, nondistended with excellent appetite Neurologic: No focal neurological deficits.  Ambulates without difficulty Psychiatric: And oriented x3.  Pleasant affect today.   Assessment/Plan: Acute problems: Acute respiratory failure with hypercarbia and hypoxia (Resolved)/Right MRSA empyema s/p right thoracotomy on 10/09/2020 Vancomycin x 6 weeks with last dose due on 11/27/2020.  ID has recommend after discharge linezolid twice daily for 2 weeks Patient has outpatient follow-up review at [facility] for HCV treatment appointment.  Follow-up regarding current treatment for osteomyelitis and epidural abscess can be completed during this admission as well.  Progressive neutropenia  -Stable labs -Suspected etiology due to Naprosyn which was discontinued on 6/7 -Also some mild transaminitis that is more likely related to his underlying history of hep C -HIV 4/24 negative  Suspected OSA/OHS Will need eventual formal testing after discharge  T11-12 discitis,  osteomyelitis and epidural abscess/S/p decompressive thoracic laminectomy 10/15/2020.   Follow-up lumbar MRI w/o evidence of infection.   Wound reassessed by neurosurgical team on 5/26.  Recommendations are to follow-up in their office after discharge Continue Suboxone,Tylenol, Robaxin, Lyrica 6/7 Naprosyn discontinued 2/2 neutropenia  Essential hypertension New diagnosis- continue Norvasc   Polysubstance abuse/IVDU Continue Suboxone. Will give short-term prescription at discharge Patient is responsible for contacting his preferred Suboxone clinic for additional follow-up and refills  Physical deconditioning Improving and able to ambulate at times for short distances without rolling walker-HHPT   Morbid obesity BMI 52.2 kg/m  Follow-up with PCP regarding weight management strategies    Other problems: Positive hepatitis C antibody No known previous history of hepatitis C infection.   Antibodies positive.    But RNA negative so no active infection ID recommends continued screening as an outpatient as indicated  Anxiety Continue as needed hydroxyzine.  Normocytic anemia/Iron deficiency anemia Hemoglobin is stable over 9.   Continue PO iron   Tobacco dependence Smoking cessation counseling provided.    Data Reviewed: Basic Metabolic Panel: Recent Labs  Lab 11/20/20 0251 11/21/20 0353  NA 135 135  K 4.4 4.8  CL 103 101  CO2 26 27  GLUCOSE 155* 114*  BUN 18 21*  CREATININE 0.82 0.78  CALCIUM 8.7* 8.9   Liver Function Tests: Recent Labs  Lab 11/20/20 0251 11/21/20 0353  AST 51* 46*  ALT 36 37  ALKPHOS 87 78  BILITOT 0.3 0.4  PROT 6.1* 5.6*  ALBUMIN 3.1* 3.0*     CBC: Recent Labs  Lab 11/20/20 0251 11/20/20 0500 11/21/20 0353  WBC 1.9* 2.4* 2.3*  NEUTROABS  --  0.4* 0.3*  HGB 12.9* 11.0* 10.9*  HCT 40.5 34.6* 33.7*  MCV 94.4 94.5 93.6  PLT 226 279 257     Scheduled Meds:  acetaminophen  1,000 mg Oral TID   amLODipine  5 mg Oral Daily    buprenorphine-naloxone  2 tablet Sublingual BID   Chlorhexidine Gluconate Cloth  6 each Topical Daily   docusate sodium  100 mg Oral BID   enoxaparin (LOVENOX) injection  70 mg Subcutaneous Q24H   ferrous sulfate  325 mg Oral BID WC   lidocaine  1 patch Transdermal Q24H   mouth rinse  15 mL Mouth Rinse BID   nicotine  14 mg Transdermal Daily   nystatin  5 mL Oral QID   polyethylene glycol  17 g Oral Daily   pregabalin  50 mg Oral QHS   senna-docusate  2 tablet Oral QHS   sodium chloride flush  10-40 mL Intracatheter Q12H   Continuous Infusions:  sodium chloride 1,000 mL (11/16/20 2254)   vancomycin 1,000 mg (11/25/20 2159)    Principal Problem:   Empyema lung (HCC) Active Problems:   Polysubstance dependence including opioid type drug with complication, episodic abuse (HCC)   Morbid obesity with BMI of 50.0-59.9, adult (HCC)   Tobacco dependence   MRSA infection   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 S/p decompressive thoracic laminectomy T11-12 on 10/15/2020    Antibiotics: Vancomycin 4/24 >> Maxipime 4/24 through 4/28 Ancef 5/2 through 5/3   Time spent: 15 minutes    Junious Silk ANP  [facility] 7 am - 330 pm/M-F for direct patient care and secure chat Please refer to Amion for contact info 50  days

## 2020-11-26 NOTE — TOC Progression Note (Signed)
Transition of Care [facility]) - Progression Note    Patient Details  Name: Derek Woods MRN: [id number] Date of Birth: 07-Oct-1990  Transition of Care [facility]) CM/SW Contact  Janae Bridgeman, RN Phone Number: 11/26/2020, 2:55 PM  Clinical Narrative:    Case management called and spoke with the patient on the phone regarding his discharge home with his father tomorrow.  The father is planning on picking him up by car.  The patient states that he does not have money since he is not working but either his mother or father will be providing assistance with co-pays for medications.  I provided a MATCH for pharmacy to give assistance to the patient.  Financial counseling will be following up with the patient for assistance.  The patient was given a Medicaid application and is aware that he must fill this out and mail to the [facility] DSS office.  I re-explained this process to the patient and he is aware.  CM will continue to follow the patient for discharge needs for home tomorrow.   Expected Discharge Plan: Home/Self Care Barriers to Discharge: Continued Medical Work up, Inadequate or no insurance, Active Substance Use with PICC Line  Expected Discharge Plan and Services Expected Discharge Plan: Home/Self Care In-house Referral: Clinical Social Work, PCP / Management consultant, Artist Discharge Planning Services: CM Consult, Indigent Health Clinic, Medication Assistance, Follow-up appt scheduled Post Acute Care Choice: Durable Medical Equipment, Home Health Living arrangements for the past 2 months: Apartment                 DME Arranged: 3-N-1, Walker rolling (Patient will need bariatric RW and 3:1 closer to discharge home from the hospital.) DME Agency: [employer]                   Social Determinants of Health (SDOH) Interventions    Readmission Risk Interventions Readmission Risk Prevention Plan 10/30/2020  Transportation Screening Complete  PCP or Specialist  Appt within 5-7 Days Complete  Home Care Screening Complete  Medication Review (RN CM) Complete

## 2020-11-27 DIAGNOSIS — B1920 Unspecified viral hepatitis C without hepatic coma: Secondary | ICD-10-CM

## 2020-11-27 DIAGNOSIS — I10 Essential (primary) hypertension: Secondary | ICD-10-CM

## 2020-11-27 DIAGNOSIS — G4733 Obstructive sleep apnea (adult) (pediatric): Secondary | ICD-10-CM

## 2020-11-27 DIAGNOSIS — D509 Iron deficiency anemia, unspecified: Secondary | ICD-10-CM

## 2020-11-27 NOTE — Discharge Summary (Signed)
Physician Discharge Summary  Derek Woods VVO:[id number] DOB: 12-Jan-1991 DOA: 10/07/2020  PCP: Pcp, No  Admit date: 10/07/2020 Discharge date: 11/27/2020  Time spent: 35 minutes  Recommendations for Outpatient Follow-up:  Patient has an appointment set up with [facility] on June 28 at 2:30 PM Patient has follow-up appointment with the ID clinic for HCV treatment and follow-up regarding his bacteremia with Dr. Renold Don 6/21 at 8:45 AM Patient will continue Suboxone after discharge.  He has been given a 7-day supply of his current dose.  He is in the process of arranging follow-up with [facility] which is a virtual Suboxone clinic An ambulatory referral has been sent to Dr. 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  He also has asthma, morbid obesity with a BMI of 52, daily smoker, suspected sleep apnea.  He presented to [facility] on 4/24 with back pain, right flank pain and shortness of breath x1 week.  CT of the chest consistent with large right empyema and he was admitted by the hospitalist team.   Subsequently taken to the OR on 4/26 for VATS  and drainage of right empyema but due to severity of disease thoracotomy was completed instead.  He was a difficult intubation prior to the procedure and was unable to be weaned from the ventilator postoperatively.  There were also concerns of possible difficulty with appropriate oxygenation during the procedure.  He was subsequently admitted to the ICU under the care of PCCM.  Eventually extubated on 4/27 and transferred out of ICU on 4/28.  Posterior chest tube removed 4/29.  Intraoperative cultures grew MRSA.  Antibiotics have been de-escalated to vancomycin.  ID consulted on 4/30.  MRI of thoracic spine was done and demonstrated T11/T12 discitis and possible abscess.  Neurosurgery was consulted and he subsequently underwent decompressive thoracic laminectomy on 5/2.  Incidental finding of hep C positive antibodies.  Hospital Course:  Acute problems: Acute respiratory failure with hypercarbia and hypoxia (Resolved)/Right MRSA empyema s/p right thoracotomy on 10/09/2020 Has completed 6 weeks of IV vancomycin  ID has recommended linezolid twice daily for 2 weeks for discharge Patient has outpatient follow-up at [facility] for HCV treatment appointment.  Follow-up regarding current treatment for osteomyelitis and epidural abscess can be completed during this admission as well.   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   Follow-up lumbar MRI w/o evidence of infection.   Wound reassessed by neurosurgical team on 5/26.  Recommendations are to follow-up in their  office after discharge   Essential hypertension New diagnosis- continue Norvasc after discharge   Polysubstance abuse/IVDU Continue Suboxone.  Given a 7-day prescription at discharge Patient is responsible for contacting  his preferred Suboxone clinic for additional follow-up and refills   Physical deconditioning Improving and able to ambulate at times for short distances without rolling walker-HHPT   Morbid obesity BMI 52.2 kg/m Follow-up with PCP regarding weight management strategies       Other problems: Positive hepatitis C antibody No known previous history of hepatitis C infection.   Antibodies positive.    But RNA negative so no active infection ID recommends continued screening as an outpatient as indicated  Normocytic anemia/Iron deficiency anemia Hemoglobin is stable over 9.   Continue PO iron after discharge   Tobacco dependence Smoking cessation counseling provided.   Procedures: Chest tube placement, thoracotomy and intubation Right upper arm PICC line. S/p decompressive thoracic laminectomy T11-12 on 10/15/2020  Consultations: CT surgery ID Neurosurgery    Discharge Exam: Vitals:   11/26/20 1101 11/26/20 2029  BP: 108/68 135/72  Pulse: 89 99  Resp: 18 19  Temp: 98.6 F (37 C) 98.6 F (37 C)  SpO2: 100% 97%   Constitutional: No acute distress, calm Respiratory: Lung sounds are clear to auscultation, stable on room air pulse ox 97% Cardiovascular: Normal heart sounds, no JVD or peripheral edema.  Regular pulse. Abdomen: Obese, soft, nondistended with active bowel sounds. LBM 6/12 Neurologic: No focal neurological deficits.  Ambulates without difficulty Psychiatric: And oriented x3.  Pleasant affect today.     Discharge Instructions   Discharge Instructions     Ambulatory referral to Cardiothoracic Surgery   Complete by: As directed    Hospital follow-up after thoracotomy   Ambulatory referral to Neurosurgery   Complete by: As directed    Routine hospital follow-up after decompressive laminectomy   Diet general   Complete by: As directed    Discharge instructions   Complete by: As directed    Please take all medications as prescribed.  You have been  diagnosed with high blood pressure and have been started on a drug called Norvasc/amlodipine  You have also been found to have iron deficiency anemia so you have been started on iron pills.  Can cause constipation and you may need to take stool softeners or laxatives as needed  You have been given prescriptions both for Robaxin and Lyrica to help your back pain.  Refills will need to be obtained from your primary care physician  Keep all arranged follow-up appointments as scheduled.   Increase activity slowly   Complete by: As directed    No wound care   Complete by: As directed       Allergies as of 11/27/2020       Reactions   Augmentin [amoxicillin-pot Clavulanate] Other (See Comments)   Pt does not recall, childhood   Ceclor [cefaclor] Other (See Comments)   Pt does not recall, childhood   Sulfa Antibiotics Hives        Medication List     STOP taking these medications    acetaminophen 500 MG tablet Commonly known as: TYLENOL   ibuprofen 200 MG tablet Commonly known as: ADVIL       TAKE these medications    amLODipine 5 MG tablet Commonly known as: NORVASC Take 1 tablet (5 mg total) by mouth daily.   buprenorphine-naloxone 2-0.5 mg Subl SL tablet Commonly known as: SUBOXONE Place 2 tablets under the tongue 2 (two) times  daily for 7 days.   ferrous sulfate 325 (65 FE) MG tablet Take 1 tablet (325 mg total) by mouth 2 (two) times daily with a meal.   linezolid 600 MG tablet Commonly known as: ZYVOX Take 1 tablet (600 mg total) by mouth 2 (two) times daily for 14 days.   Melatonin 10 MG Tabs Take 10 mg by mouth at bedtime as needed (sleep).   methocarbamol 500 MG tablet Commonly known as: ROBAXIN Take 1 tablet (500 mg total) by mouth every 6 (six) hours as needed for muscle spasms.   pregabalin 50 MG capsule Commonly known as: LYRICA Take 1 capsule (50 mg total) by mouth at bedtime.               Durable Medical Equipment  (From admission,  onward)           Start     Ordered   10/30/20 1400  For home use only DME 3 n 1  Once        10/30/20 1359   10/30/20 1359  For home use only DME Walker rolling  Once       Comments: Needs bariatric / Heavy duty  Question Answer Comment  Walker: With 5 Inch Wheels   Patient needs a walker to treat with the following condition H/O laminectomy      10/30/20 1359           Allergies  Allergen Reactions   Augmentin [Amoxicillin-Pot Clavulanate] Other (See Comments)    Pt does not recall, childhood   Ceclor [Cefaclor] Other (See Comments)    Pt does not recall, childhood   Sulfa Antibiotics Hives    Follow-up Information     [facility]. Go on 11/27/2020.   Why: Please follow up with the clinic for a scheduled hospital follow up scheduled for Tuesday, December 11, 2020 at 2:30 pm. Contact information: [address] [place] [phone]        Raymondo BandVu, Trung T, MD Follow up on 12/04/2020.   Specialty: Infectious Diseases Why: [facility] clinic for HCV treatment-appointment scheduled for 8:45 am Contact information: [address] [place] [address] [phone]         [facility]. Schedule an appointment as soon as possible for a visit.   Why: Please follow up at your planned Virtual Suboxone Clinic of see the above clinic for follow up suboxone treatment for drug rehabilitation. Contact information: [address] [place], [place]        Bartle, Payton DoughtyBryan K, MD. Call in 1 week(s).   Specialty: Cardiothoracic Surgery Why: If you have not heard from Dr. Sharee PimpleBartle's office please call to confirm follow-up appointment.  A referral was sent prior to your discharge. Contact information: [address] [place] [address] [phone]         Donalee Citrinram, Gary, MD. Call in 1 week(s).   Specialty: Neurosurgery Why: If you have not heard from Dr. Lonie Peakram's office after discharge please  call to confirm appointment date and time.  A referral was sent prior to discharge. Contact information: [address] [place] [address] [phone]         Donalee Citrinram, Gary, MD .   Specialty: Neurosurgery Contact information: [phone] [place] [address] [phone]                  The results of significant diagnostics  from this hospitalization (including imaging, microbiology, ancillary and laboratory) are listed below for reference.    Significant Diagnostic Studies: No results found.  Microbiology: No results found for this or any previous visit (from the past 240 hour(s)).   Labs: Basic Metabolic Panel: Recent Labs  Lab 11/21/20 0353  NA 135  K 4.8  CL 101  CO2 27  GLUCOSE 114*  BUN 21*  CREATININE 0.78  CALCIUM 8.9   Liver Function Tests: Recent Labs  Lab 11/21/20 0353  AST 46*  ALT 37  ALKPHOS 78  BILITOT 0.4  PROT 5.6*  ALBUMIN 3.0*   No results for input(s): LIPASE, AMYLASE in the last 168 hours. No results for input(s): AMMONIA in the last 168 hours. CBC: Recent Labs  Lab 11/21/20 0353  WBC 2.3*  NEUTROABS 0.3*  HGB 10.9*  HCT 33.7*  MCV 93.6  PLT 257   Cardiac Enzymes: No results for input(s): CKTOTAL, CKMB, CKMBINDEX, TROPONINI in the last 168 hours. BNP: BNP (last 3 results) No results for input(s): BNP in the last 8760 hours.  ProBNP (last 3 results) No results for input(s): PROBNP in the last 8760 hours.  CBG: No results for input(s): GLUCAP in the last 168 hours.     Signed:  Junious Silk ANP [facility] 11/27/2020, 9:55 AM

## 2020-11-27 NOTE — TOC Transition Note (Signed)
Transition of Care [facility]) - CM/SW Discharge Note   Patient Details  Name: Derek Woods MRN: [id number] Date of Birth: April 15, 1991  Transition of Care [facility]) CM/SW Contact:  Janae Bridgeman, RN Phone Number: 11/27/2020, 10:04 AM   Clinical Narrative:    Case management called and spoke with the patient's father, Derek Woods, this morning and he plans to pick the patient up and transport him home by car today after 1230 after the patient's 10 am Vancomycin dosage.  The father plans to bring cash from home to pay for [facility] medications after the [facility] was placed to assist with medication costs.  The patient plans to follow up with virtual Suboxone clinic for follow up treatment for drug rehab program for history of drug abuse and addiction for counseling.  Discharge to home planned for noon to 1 pm today with family.  The patient is being transported home by car and no DME is needed.   Final next level of care: Home/Self Care Barriers to Discharge: Continued Medical Work up, Inadequate or no insurance, Active Substance Use with PICC Line   Patient Goals and CMS Choice Patient states their goals for this hospitalization and ongoing recovery are:: Patient wishes to get better and plans to discharge home with mother in [place] . CMS Medicare.gov Compare Post Acute Care list provided to:: Patient Choice offered to / list presented to : Patient  Discharge Placement                       Discharge Plan and Services In-house Referral: Clinical Social Work, PCP / Management consultant, Artist Discharge Planning Services: CM Consult, Indigent Health Clinic, Medication Assistance, Follow-up appt scheduled Post Acute Care Choice: Durable Medical Equipment, Home Health          DME Arranged: 3-N-1, Walker rolling (Patient will need bariatric RW and 3:1 closer to discharge home from the hospital.) DME Agency: [employer]                  Social Determinants of Health (SDOH)  Interventions     Readmission Risk Interventions Readmission Risk Prevention Plan 10/30/2020  Transportation Screening Complete  PCP or Specialist Appt within 5-7 Days Complete  Home Care Screening Complete  Medication Review (RN CM) Complete

## 2020-11-28 ENCOUNTER — Ambulatory Visit: Payer: Self-pay | Admitting: Surgery

## 2020-12-04 ENCOUNTER — Inpatient Hospital Stay: Payer: Self-pay | Admitting: Infectious Diseases

## 2020-12-06 ENCOUNTER — Other Ambulatory Visit: Payer: Self-pay | Admitting: Surgery

## 2020-12-06 DIAGNOSIS — J869 Pyothorax without fistula: Secondary | ICD-10-CM

## 2020-12-10 ENCOUNTER — Ambulatory Visit: Payer: Self-pay

## 2020-12-10 ENCOUNTER — Ambulatory Visit: Payer: Self-pay | Admitting: Surgery

## 2020-12-10 NOTE — Progress Notes (Deleted)
  HPI:   The patient is a 30 year old gentleman with a history of morbid obesity, daily heroin abuse,  and smoking who presents with about a 3-week history of progressive right back pain with development of worsening shortness of breath over the past week.  He said that he initially thought he had pulled a muscle and it would get better but it has not. Patient was found to have  A right empyema and based on its appearance, would benefit from thoracic surgery. Patient returns for routine postoperative follow-up having undergone a right VATS, right thoracotomy, decortication, and drainage of right empyema by Dr. Laneta Simmers on 10/09/2020. Of note, the patient was found to have T11/T12 osteomyelitis discitis, epidural abscess,  severe thoracic cord compression, ,and thoracic myelopathy. She underwent a decompressive thoracic laminectomy T11/T12, with T11/T12 transpedicular discectomy and evacuation of epidural abscess with foraminectomies of T11/T12 nerve roots.  She was treated with Vancomycin  for 6 weeks and was discharged in stable condition on 11/27/2020 on Linezolid for 2 weeks.     Current Outpatient Medications  Medication Sig Dispense Refill   amLODipine (NORVASC) 5 MG tablet Take 1 tablet (5 mg total) by mouth daily. 30 tablet 0   ferrous sulfate 325 (65 FE) MG tablet Take 1 tablet (325 mg total) by mouth 2 (two) times daily with a meal. 60 tablet 3   linezolid (ZYVOX) 600 MG tablet Take 1 tablet (600 mg total) by mouth 2 (two) times daily for 14 days. 28 tablet 0   Melatonin 10 MG TABS Take 10 mg by mouth at bedtime as needed (sleep).     methocarbamol (ROBAXIN) 500 MG tablet Take 1 tablet (500 mg total) by mouth every 6 (six) hours as needed for muscle spasms. 45 tablet 0   pregabalin (LYRICA) 50 MG capsule Take 1 capsule (50 mg total) by mouth at bedtime. 30 capsule 1  Vital Signs:   Physical Exam: ***  Diagnostic Tests: ***  Impression and Plan: ***     Ardelle Balls, PA-C [facility] [phone]

## 2020-12-11 ENCOUNTER — Other Ambulatory Visit: Payer: Self-pay

## 2020-12-11 ENCOUNTER — Ambulatory Visit: Payer: Self-pay | Attending: Family Medicine | Admitting: Family Medicine

## 2020-12-24 ENCOUNTER — Ambulatory Visit: Payer: Self-pay

## 2021-01-07 ENCOUNTER — Ambulatory Visit: Payer: Self-pay

## 2021-01-07 ENCOUNTER — Telehealth: Payer: Self-pay | Admitting: *Deleted

## 2021-03-14 ENCOUNTER — Emergency Department (HOSPITAL_COMMUNITY): Payer: Self-pay

## 2021-03-14 ENCOUNTER — Encounter (HOSPITAL_COMMUNITY): Payer: Self-pay | Admitting: Emergency Medicine

## 2021-03-14 ENCOUNTER — Emergency Department (HOSPITAL_COMMUNITY)
Admission: EM | Admit: 2021-03-14 | Discharge: 2021-03-14 | Disposition: A | Discharge status: Home / Self Care | Payer: Self-pay | Attending: Emergency Medicine | Admitting: Emergency Medicine

## 2021-03-14 DIAGNOSIS — R109 Unspecified abdominal pain: Secondary | ICD-10-CM | POA: Insufficient documentation

## 2021-03-14 DIAGNOSIS — Z5321 Procedure and treatment not carried out due to patient leaving prior to being seen by health care provider: Secondary | ICD-10-CM | POA: Insufficient documentation

## 2021-03-14 DIAGNOSIS — M545 Low back pain, unspecified: Secondary | ICD-10-CM | POA: Insufficient documentation

## 2021-03-14 LAB — URINALYSIS, COMPLETE (UACMP) WITH MICROSCOPIC
Bacteria, UA: NONE SEEN
Bilirubin Urine: NEGATIVE
Glucose, UA: NEGATIVE mg/dL
Hgb urine dipstick: NEGATIVE
Ketones, ur: NEGATIVE mg/dL
Leukocytes,Ua: NEGATIVE
Nitrite: NEGATIVE
Protein, ur: NEGATIVE mg/dL
Specific Gravity, Urine: 1.009 (ref 1.005–1.030)
pH: 5 (ref 5.0–8.0)

## 2021-03-14 IMAGING — CT CT RENAL STONE PROTOCOL
2 of 4 series · 15 of 46 positions shown, 17 images · non-contrast
Comparison: CT abdomen/pelvis [DATE], lumbar spine MRI
[DATE], thoracic spine MRI [DATE]

CLINICAL DATA: Left flank pain

EXAM:
CT ABDOMEN AND PELVIS WITHOUT CONTRAST
TECHNIQUE: Multidetector CT imaging of the abdomen and pelvis was performed
following the standard protocol without IV contrast.

[Series 3: ap without · axial · non-contrast · 0.98mm/px · z∈[+899,+1324]mm · 12 of 96 slices shown, 14 images]
[im 6/96  soft-tissue]
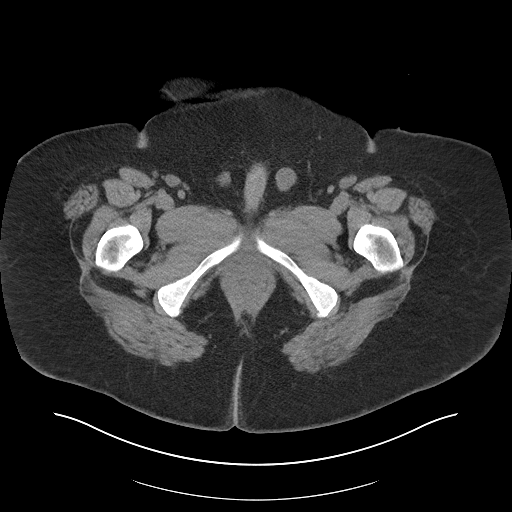
[im 6/96  bone]
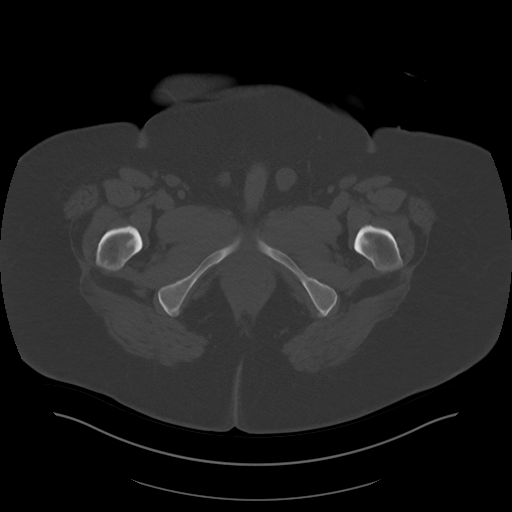
[im 16/96  soft-tissue]
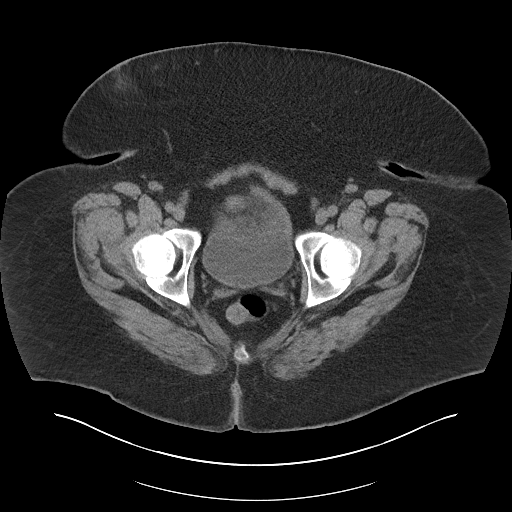
[im 21/96  soft-tissue]
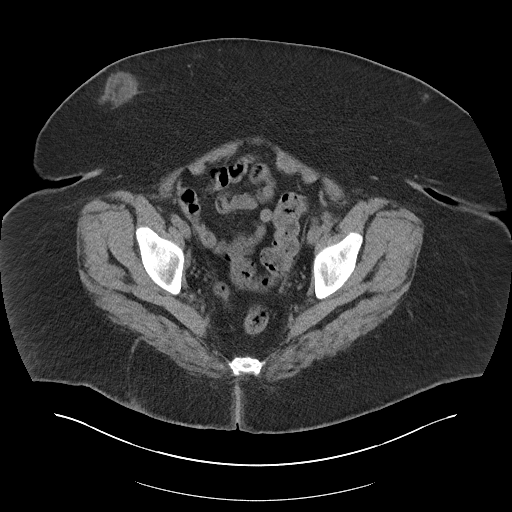
[im 31/96  soft-tissue]
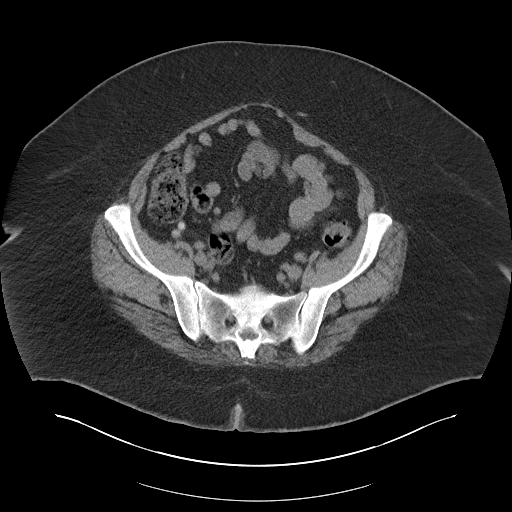
[im 36/96  soft-tissue]
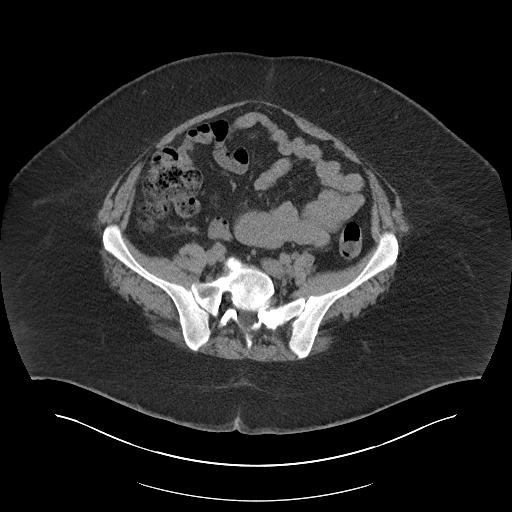
[im 46/96  soft-tissue]
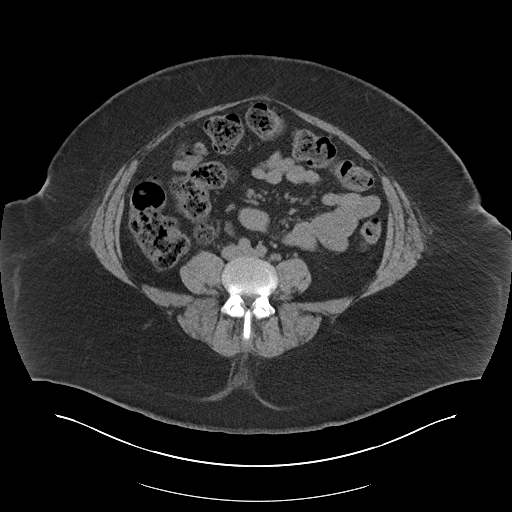
[im 51/96  soft-tissue]
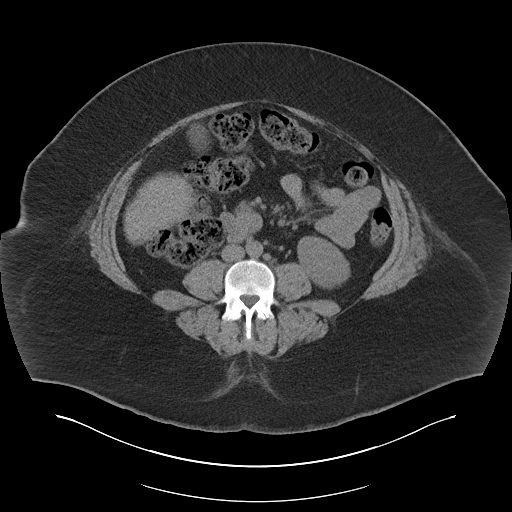
[im 61/96  soft-tissue]
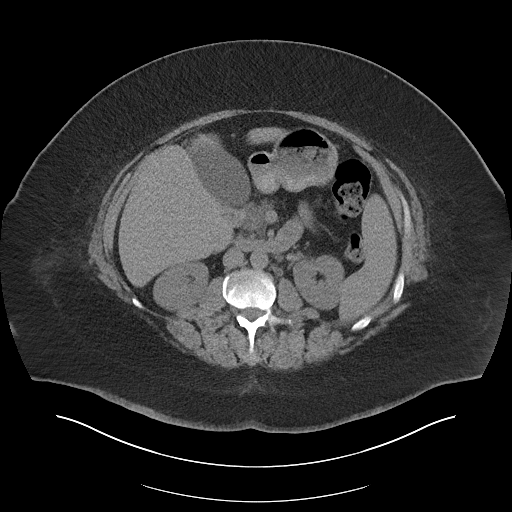
[im 66/96  soft-tissue]
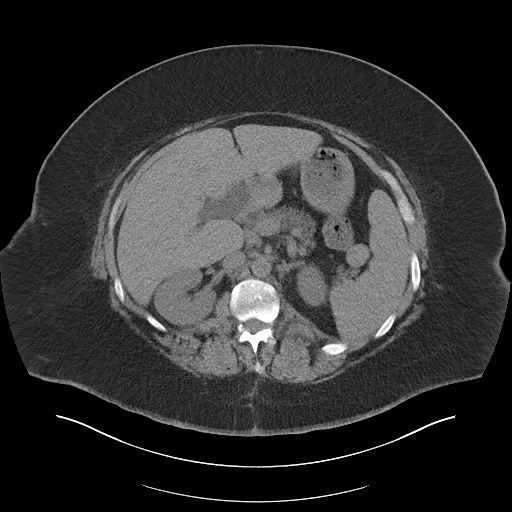
[im 66/96  bone]
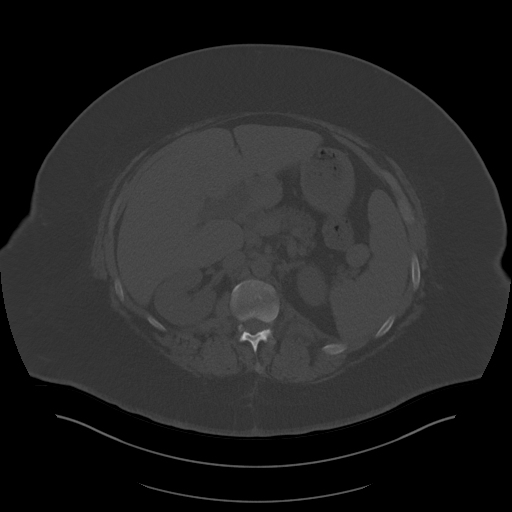
[im 76/96  soft-tissue]
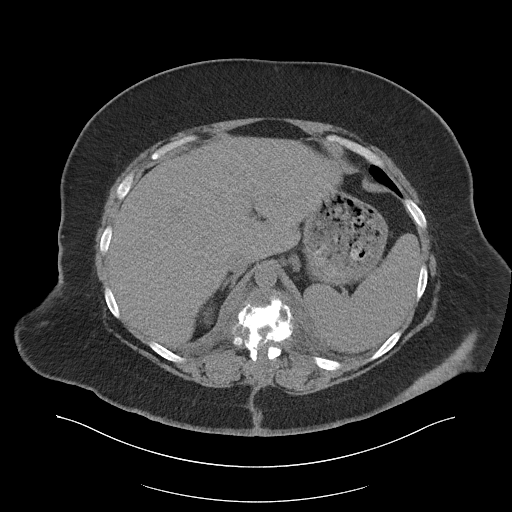
[im 81/96  soft-tissue]
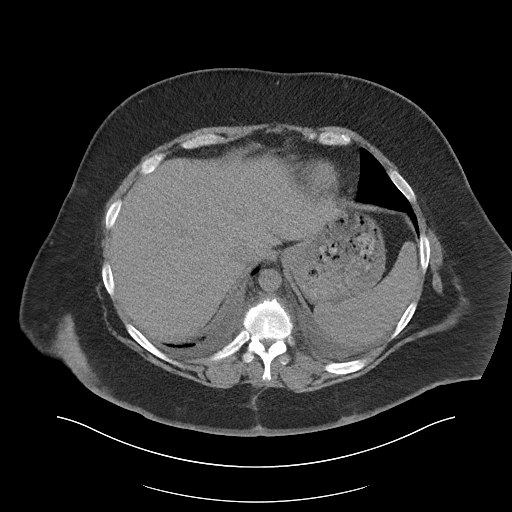
[im 91/96  soft-tissue]
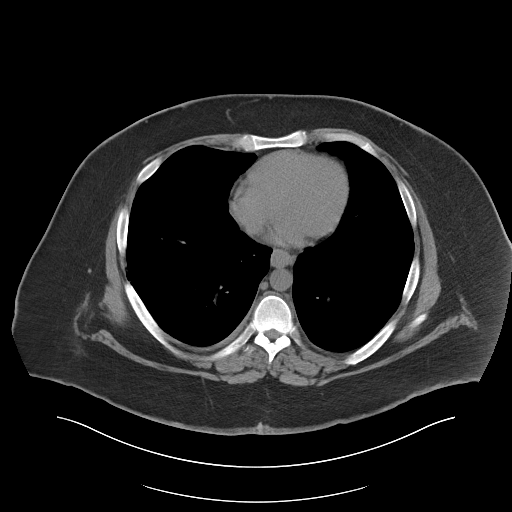

[Series 6: cor · coronal · 0.97mm/px · 3 of 98 slices shown]
[im 33/98  soft-tissue]
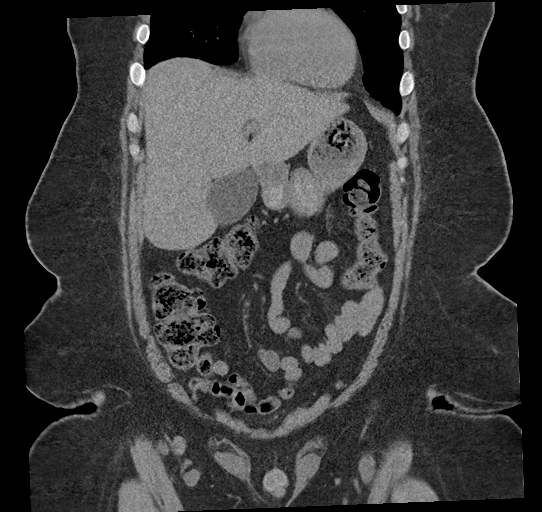
[im 44/98  soft-tissue]
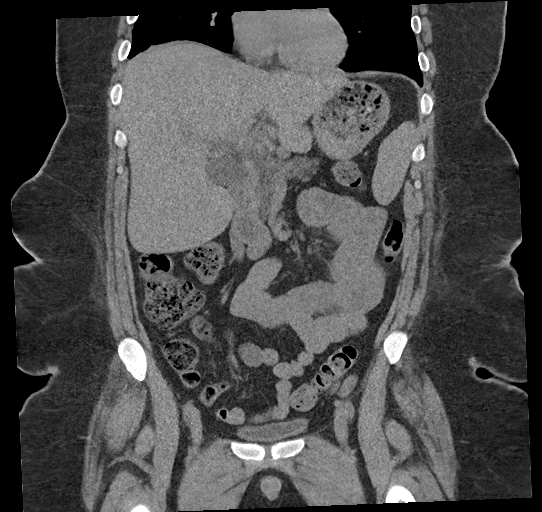
[im 54/98  soft-tissue]
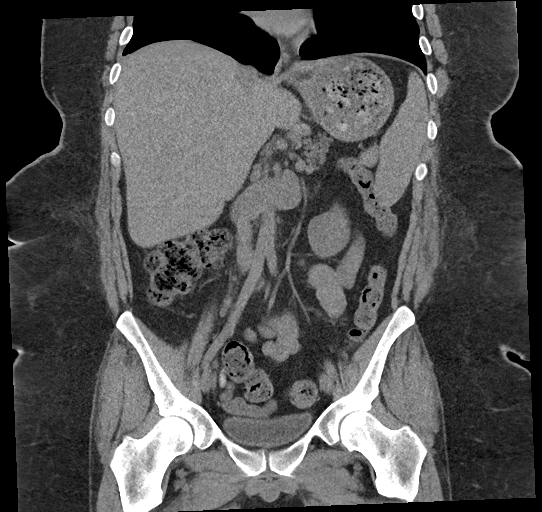

[15 of 46 positions shown; findings below may reference images not displayed]

FINDINGS: Lower chest: There is a small right pleural effusion, significantly
decreased since [DATE]. There is a trace left pleural effusion
not present on the prior study. The imaged heart is unremarkable.

Hepatobiliary: The liver is enlarged measuring up to approximately
20 cm at the midclavicular line, unchanged. There are no focal
lesions, within the confines of noncontrast technique. The
gallbladder is unremarkable. There is no biliary ductal dilatation.
There is no biliary ductal dilatation.

Pancreas: Unremarkable.

Spleen: The spleen is enlarged measuring up to 15.5 cm in the axial
plane, unchanged.

Adrenals/Urinary Tract: The adrenals are unremarkable.

The kidneys are unremarkable, with no focal lesion, stone,
hydronephrosis, or hydroureter.

Stomach/Bowel: The stomach is unremarkable. There is no evidence of
bowel obstruction. There is no abnormal bowel wall thickening or
inflammatory change. The appendix is normal.

Vascular/Lymphatic: The abdominal aorta is nonaneurysmal. r a
prominent left external iliac chain lymph node measuring up to
approximately 1.1 cm is not significantly changed there is no new or
progressive abdominal or pelvic lymphadenopathy.

Reproductive: The prostate and seminal vesicles are unremarkable.

Other: There is no ascites or free air. There is a small fat
containing umbilical hernia. There is a 3.5 cm x 3.0 cm soft tissue
attenuating lesion in the subcutaneous fat over the right lower
quadrant/pelvis with mild surrounding fat stranding.

Musculoskeletal: There is transitional lumbosacral anatomy for the
purposes of this report, the lowest disc space is designated S1-S2
with lumbarization of the S1 vertebral body. This is in keeping with
prior lumbar spine report. There is marked endplate irregularity and
sclerosis of the T11 and T12 vertebral bodies with new loss of
vertebral body height and focal kyphosis. Findings are markedly
progressed since the study from [DATE]. Findings of
discitis/osteomyelitis were present on the MRI of [DATE], but
incompletely imaged.

The patient is status post T11 and T12 laminectomies. There is soft
tissue thickening posterior to the laminectomy site without evidence
of organized or drainable fluid collection.
IMPRESSION: 1. Discitis/osteomyelitis at T11-T12 with progression of endplate
irregularity, loss of vertebral body height, and focal kyphosis
compared to the prior thoracic spine MRI from [DATE]. Consider
repeat MRI of the thoracic and lumbar spine with and without
contrast to assess for residual epidural abscess and cord
compression.
2. Interval decompressive laminectomies at T11 and T12. There is
soft tissue thickening in the adjacent soft tissues without evidence
of organized or drainable fluid collection.
3. No acute findings in the abdomen or pelvis.
4. Small right pleural effusion, significantly decreased since the
study from [DATE], and trace left pleural effusion, new since
that study.
5. 3.5 cm soft tissue density lesion in the subcutaneous fat over
the right lower quadrant/pelvis with mild surrounding fat stranding
may reflect a hematoma. Correlate with physical exam.
6. Unchanged hepatosplenomegaly.

## 2021-03-14 NOTE — ED Triage Notes (Signed)
Pt endorses left lower back pain. Hx of back surgery this year. Denies any urinary symptoms. Unsure if pain is going to abd and groin.

## 2021-03-14 NOTE — ED Provider Notes (Signed)
Emergency Medicine Provider Triage Evaluation Note  Derek Woods , a 30 y.o. male  was evaluated in triage.  Pt complains of left back pain, occasionally radiates to left flank area.  Onset late Monday night, constant dull pain, occasionally worse.  Pain is worse with twisting to the left or bending.  Denies changes in bowel or bladder habits, saddle paresthesias, fever.  History of IV drug abuse, not recently, previously admitted this year for epidural abscess, states does not feel same.  Review of Systems  Positive: Flank pain Negative: Changes in bowel or bladder habits, nausea, vomiting  Physical Exam  BP (!) 146/99 (BP Location: Right Arm)   Pulse 84   Temp 98.9 F (37.2 C) (Oral)   Resp 18   SpO2 100%  Gen:   Awake, no distress   Resp:  Normal effort  MSK:   Moves extremities without difficulty  Other:  Abdomen soft and nontender, no musculoskeletal pain.  Exam limited by habitus  Medical Decision Making  Medically screening exam initiated at 8:54 AM.  Appropriate orders placed.  Derek Woods was informed that the remainder of the evaluation will be completed by another provider, this initial triage assessment does not replace that evaluation, and the importance of remaining in the ED until their evaluation is complete.     Jeannie Fend, PA-C 03/14/21 8299    Milagros Loll, MD 03/14/21 [phone]

## 2021-03-14 NOTE — ED Notes (Signed)
No responses, not outside. Not in MRI

## 2021-04-12 ENCOUNTER — Encounter (HOSPITAL_COMMUNITY): Payer: Self-pay | Admitting: Radiology

## 2021-10-17 ENCOUNTER — Encounter (HOSPITAL_COMMUNITY): Payer: Self-pay | Admitting: Emergency Medicine

## 2021-10-17 ENCOUNTER — Emergency Department (HOSPITAL_COMMUNITY): Payer: Self-pay

## 2021-10-17 ENCOUNTER — Emergency Department (HOSPITAL_COMMUNITY)
Admission: EM | Admit: 2021-10-17 | Discharge: 2021-10-18 | Disposition: A | Discharge status: Home / Self Care | Payer: Self-pay | Attending: Emergency Medicine | Admitting: Emergency Medicine

## 2021-10-17 ENCOUNTER — Other Ambulatory Visit: Payer: Self-pay

## 2021-10-17 DIAGNOSIS — L03115 Cellulitis of right lower limb: Secondary | ICD-10-CM

## 2021-10-17 DIAGNOSIS — M25571 Pain in right ankle and joints of right foot: Secondary | ICD-10-CM | POA: Insufficient documentation

## 2021-10-17 DIAGNOSIS — M79671 Pain in right foot: Secondary | ICD-10-CM

## 2021-10-17 DIAGNOSIS — M25562 Pain in left knee: Secondary | ICD-10-CM

## 2021-10-17 IMAGING — CR DG ANKLE COMPLETE 3+V*R*
3 series · 3 of 3 positions shown · non-contrast
Comparison: None Available.

CLINICAL DATA: Pain right ankle

EXAM:
RIGHT ANKLE - COMPLETE 3 VIEW

[ankle obl]
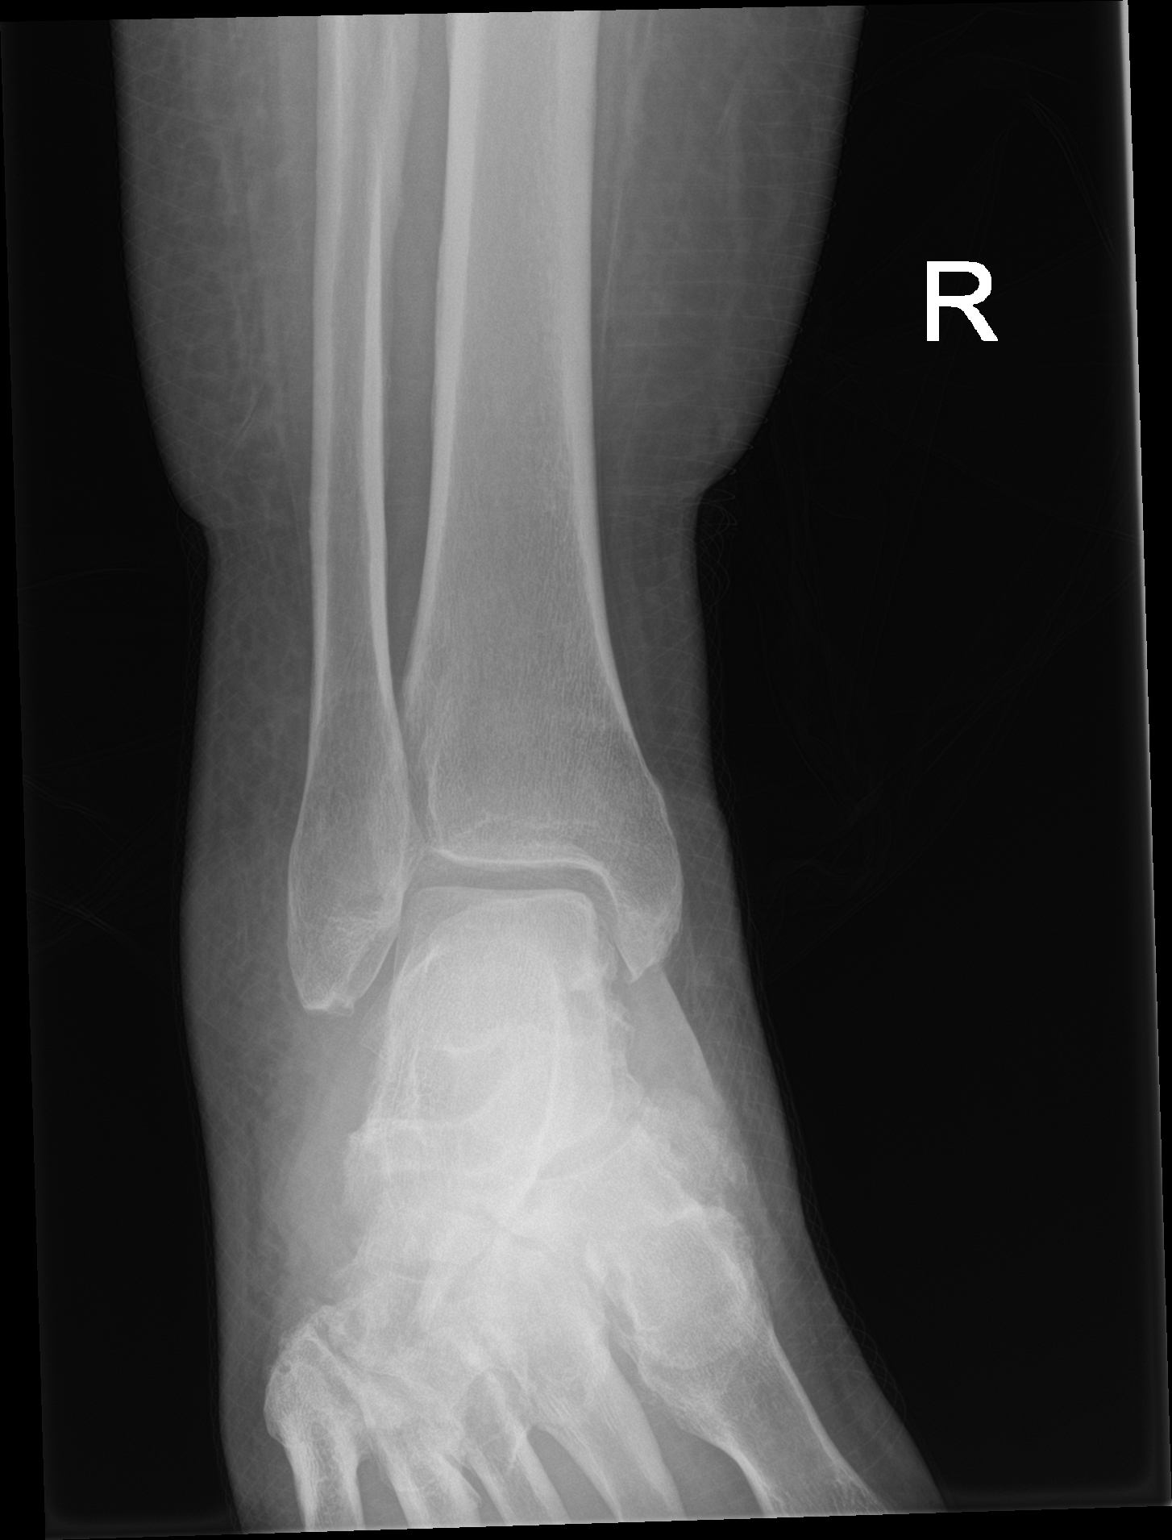

[ankle lat]
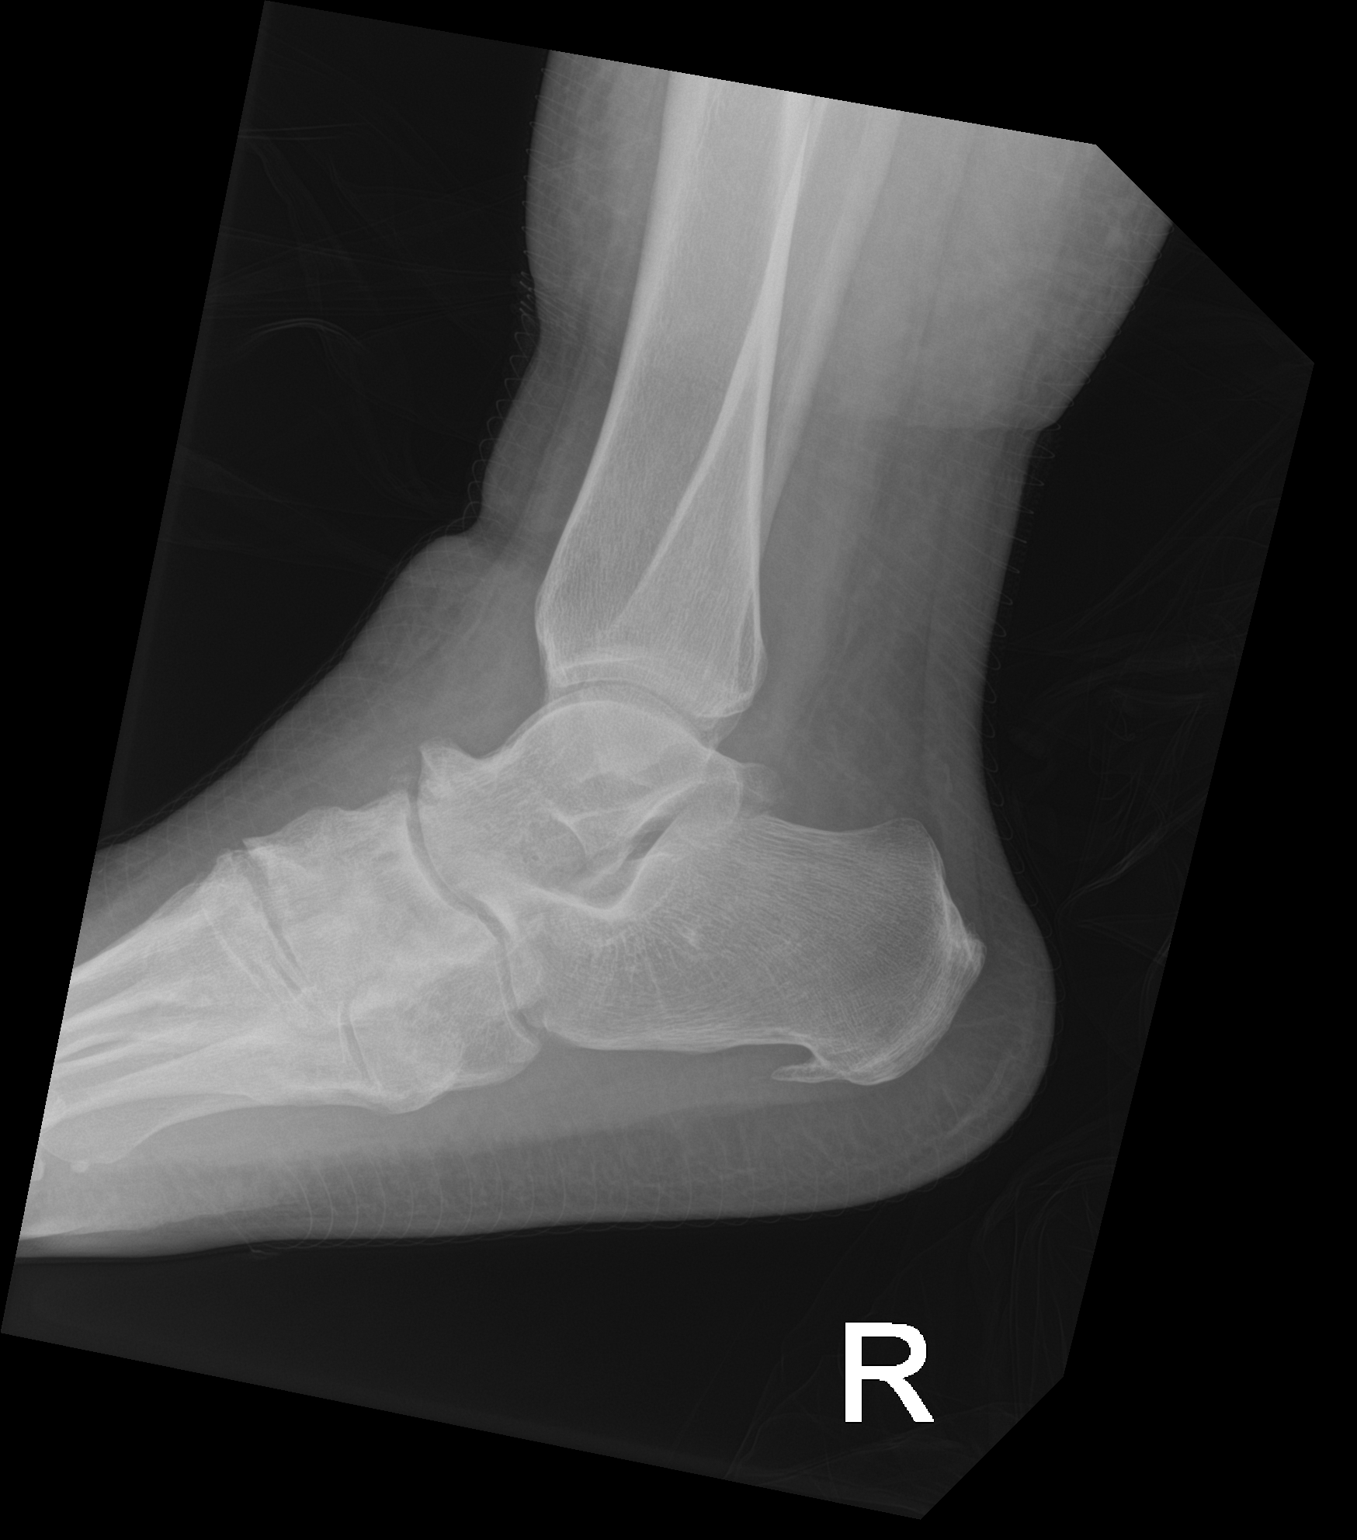

[ankle ap]
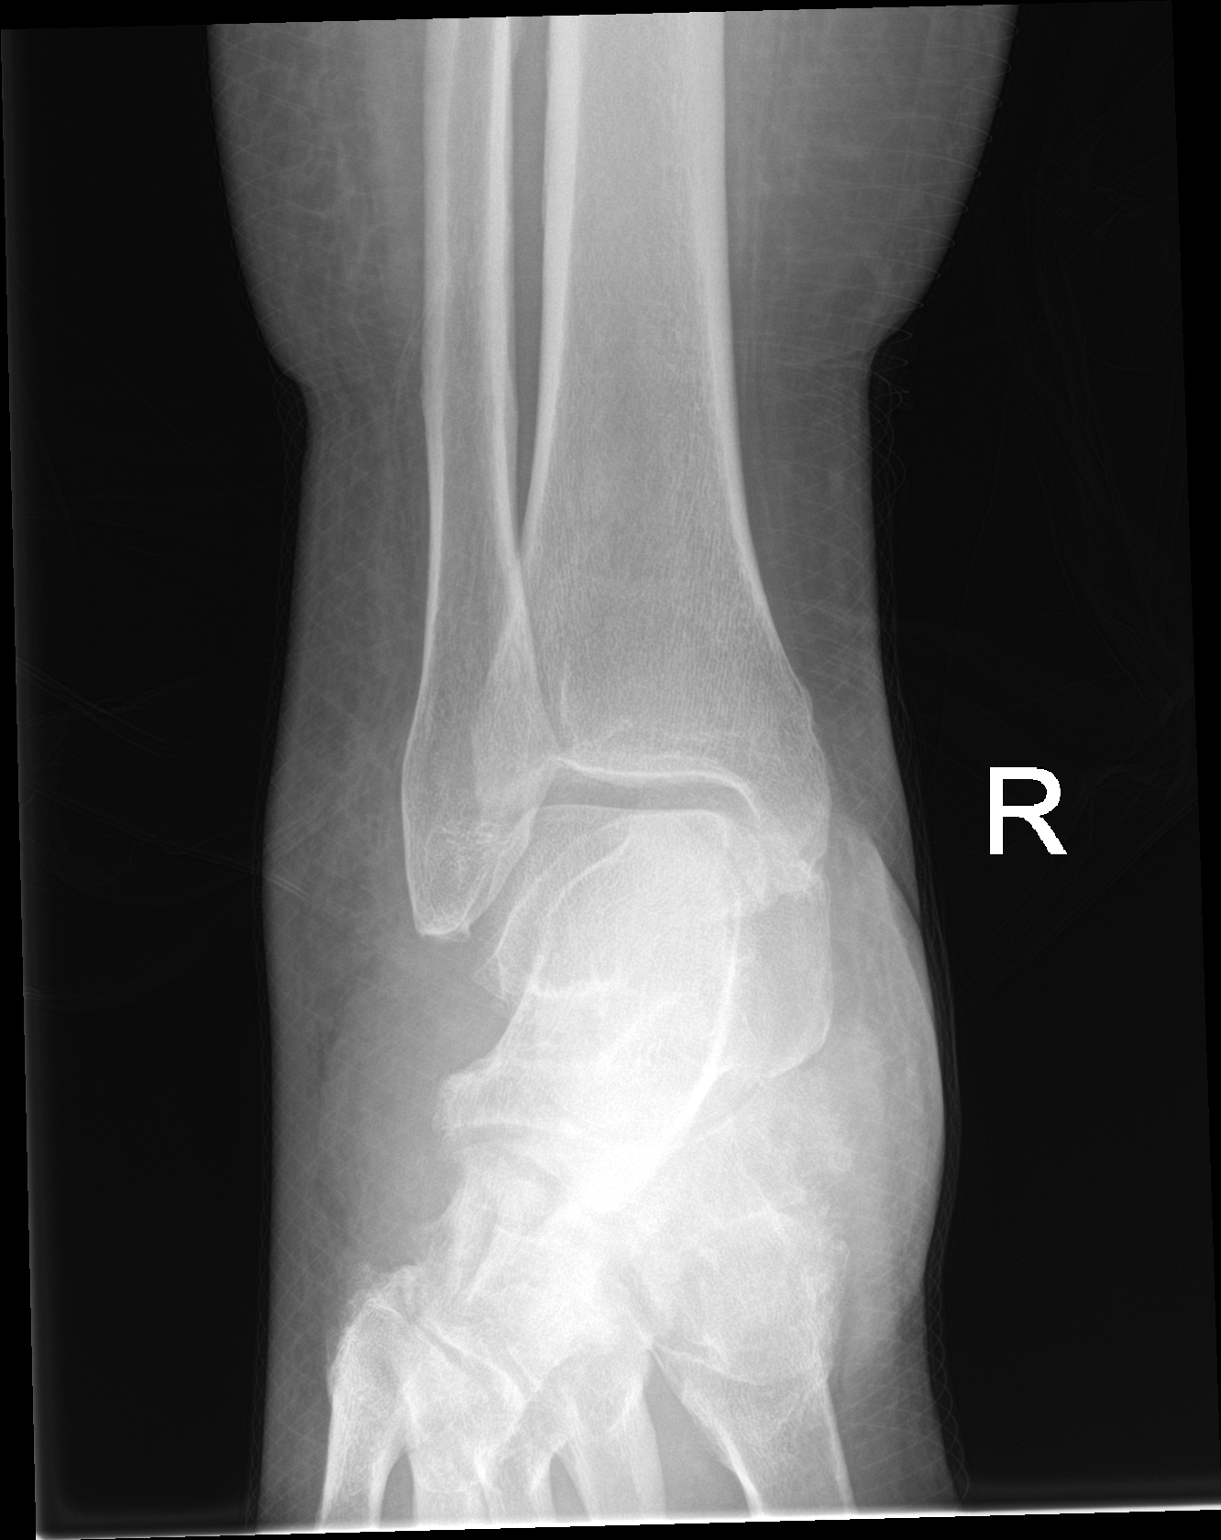

[3 of 3 positions shown; findings below may reference images not displayed]

FINDINGS: No fracture or dislocation is seen. Small bony spurs are noted at
the tips of medial and lateral malleoli. Plantar spur is seen in
calcaneus. Bony spurs are noted in the dorsal aspect of intertarsal
joints. There is soft tissue swelling around the ankle, more so over
the lateral malleolus.
IMPRESSION: No recent fracture or dislocation is seen in the right ankle.

## 2021-10-17 IMAGING — CR DG FOOT COMPLETE 3+V*R*
3 series · 3 of 3 positions shown · non-contrast
Comparison: None Available.

CLINICAL DATA: Pain

EXAM:
RIGHT FOOT COMPLETE - 3+ VIEW

[foot ap]
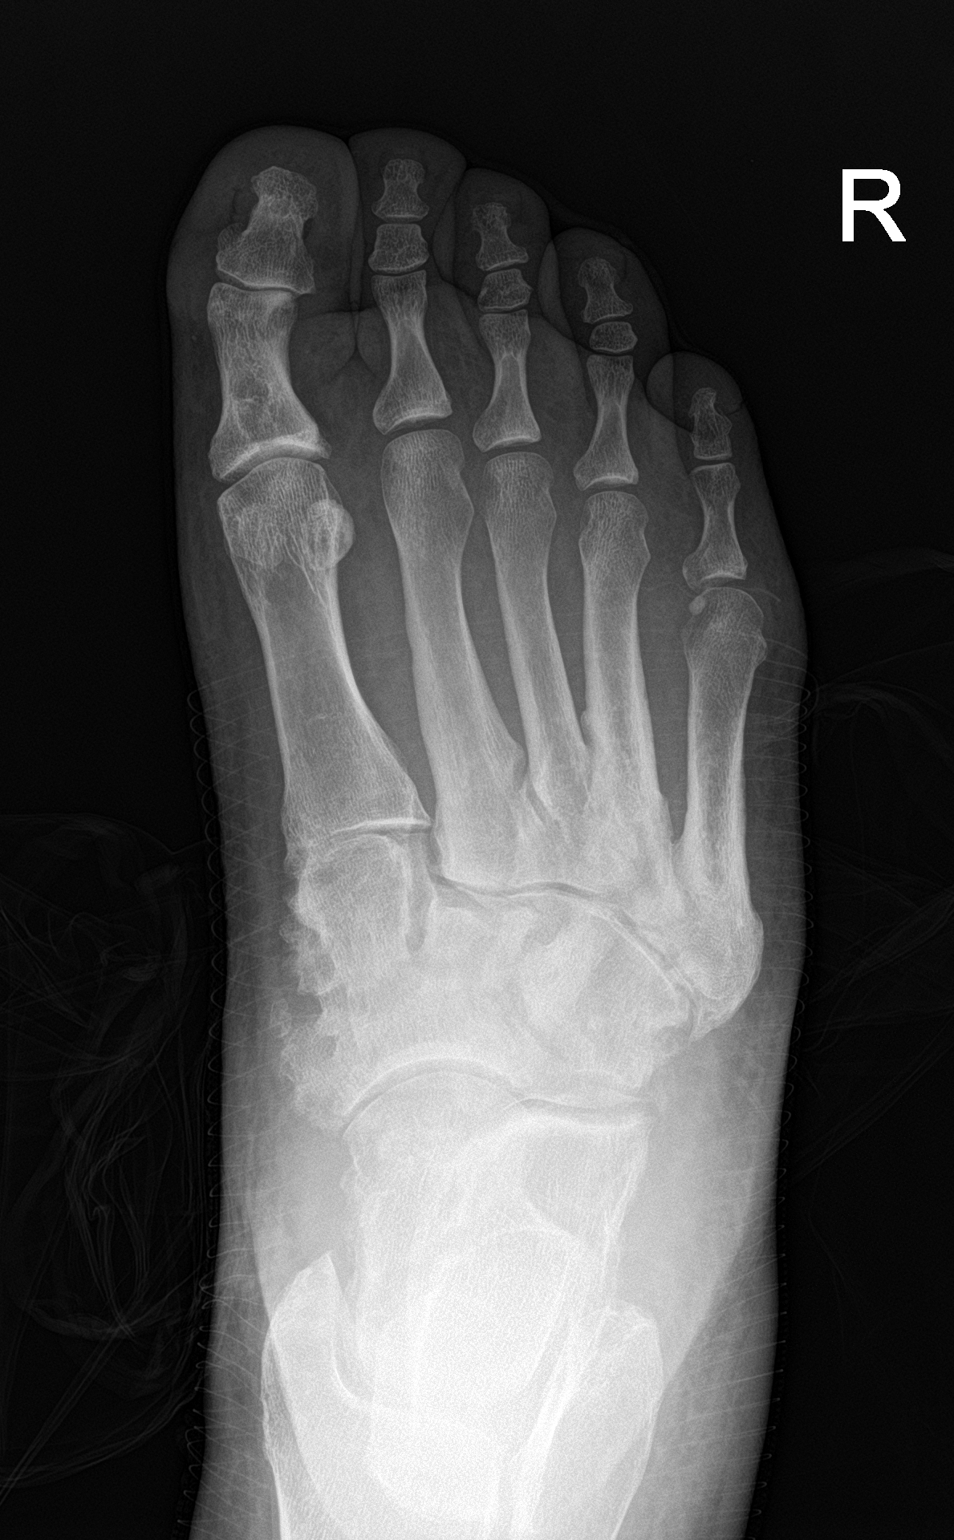

[foot obl]
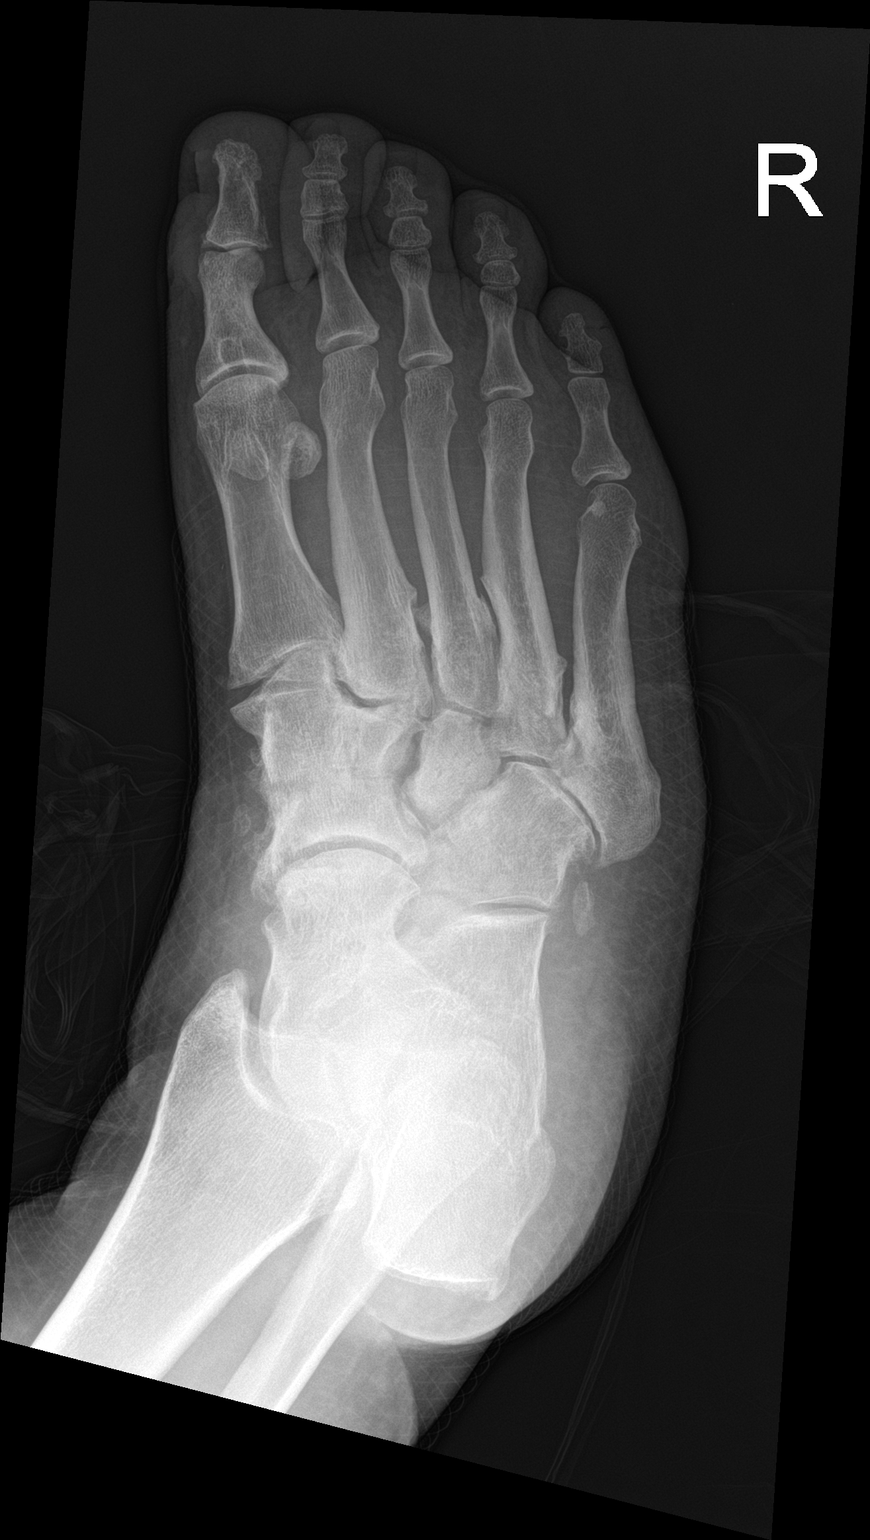

[foot lat]
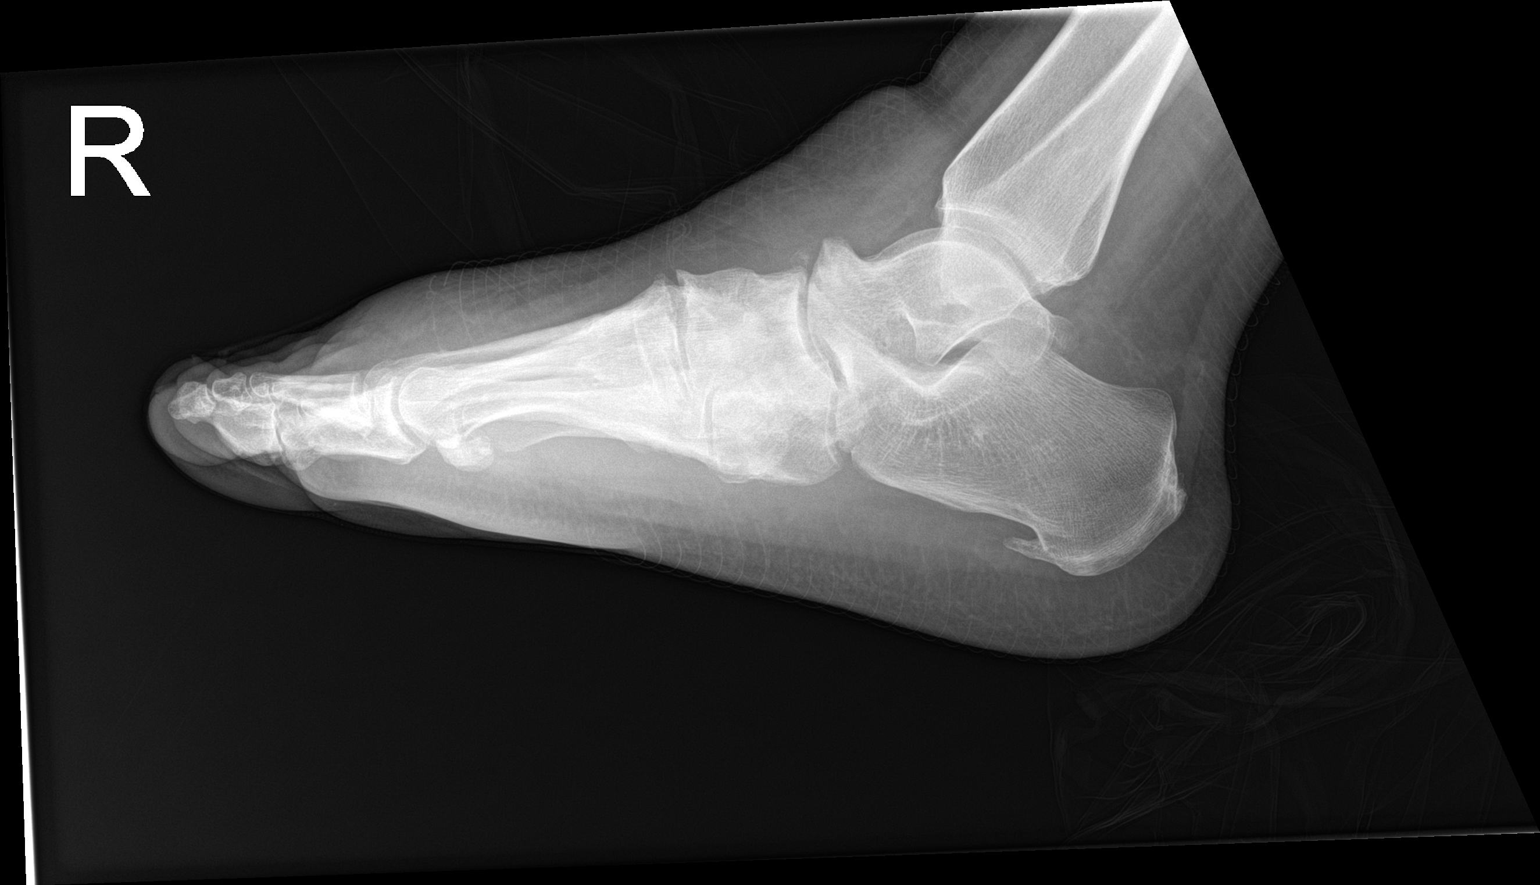

[3 of 3 positions shown; findings below may reference images not displayed]

FINDINGS: No displaced fracture or dislocation is seen. There are large bony
spurs along the medial margin of navicular and medial cuneiform.
There is partial fusion of navicular and medial and middle
cuneiforms. There are bony spurs in the dorsal aspect of intertarsal
and tarsometatarsal joints. Degenerative changes are noted in the
fifth tarsometatarsal joint. Plantar spur is seen in calcaneus.
Small bony spurs seen in first metatarsophalangeal joint. There is
soft tissue swelling over the dorsum.
IMPRESSION: No recent fracture is seen. Degenerative changes are noted in
multiple joints as described in the body of the report. There is
partial fusion of navicular, middle cuneiform and medial cuneiform.

## 2021-10-17 IMAGING — CR DG KNEE COMPLETE 4+V*L*
4 series · 4 of 4 positions shown · non-contrast
Comparison: None Available.

CLINICAL DATA: Leg pain

EXAM:
LEFT KNEE - COMPLETE 4+ VIEW

[knee ap]
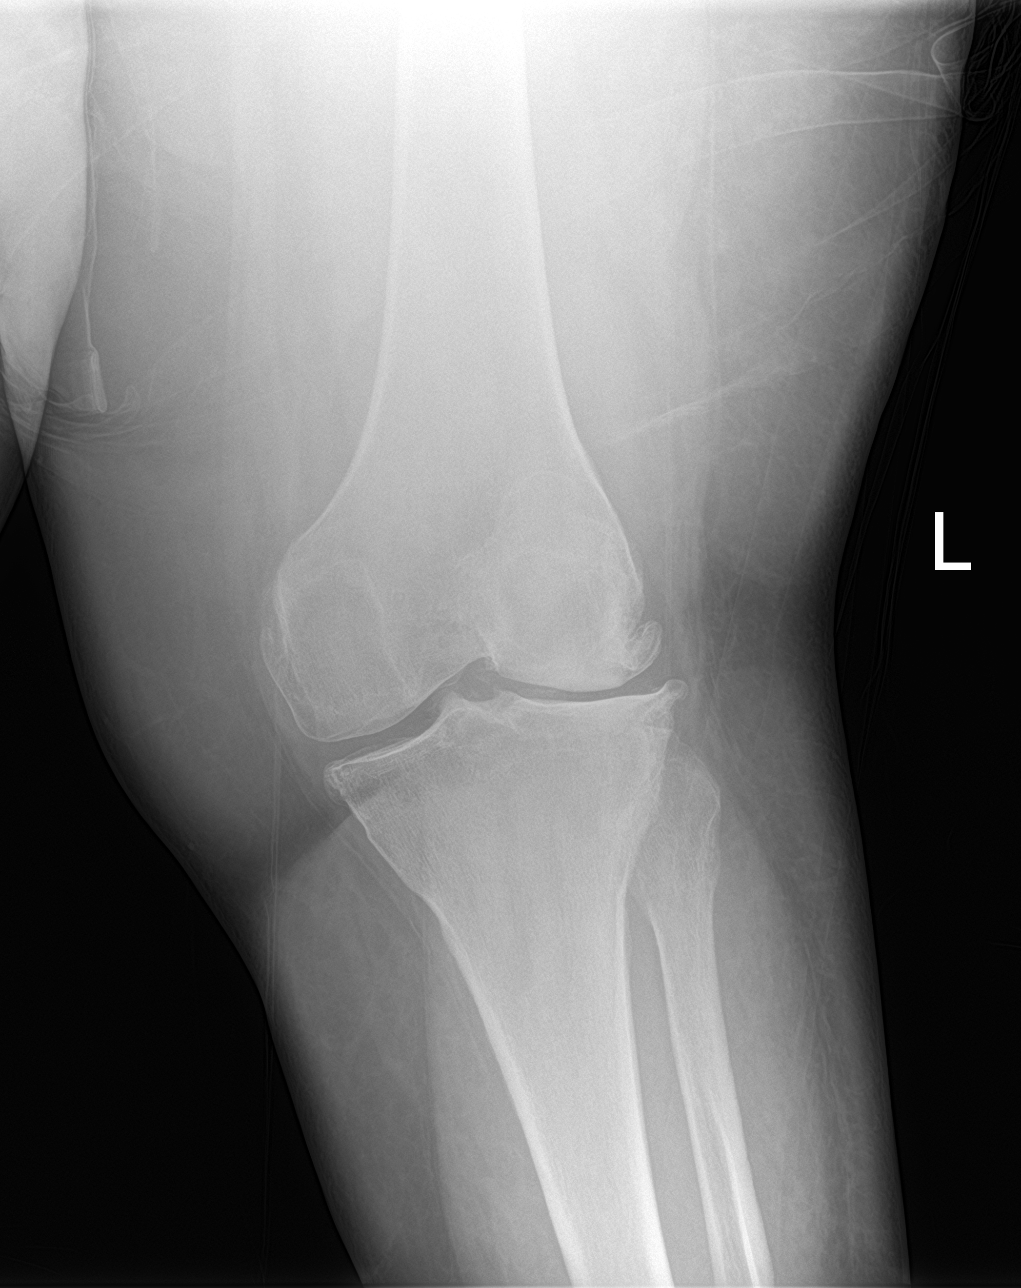

[knee lat]
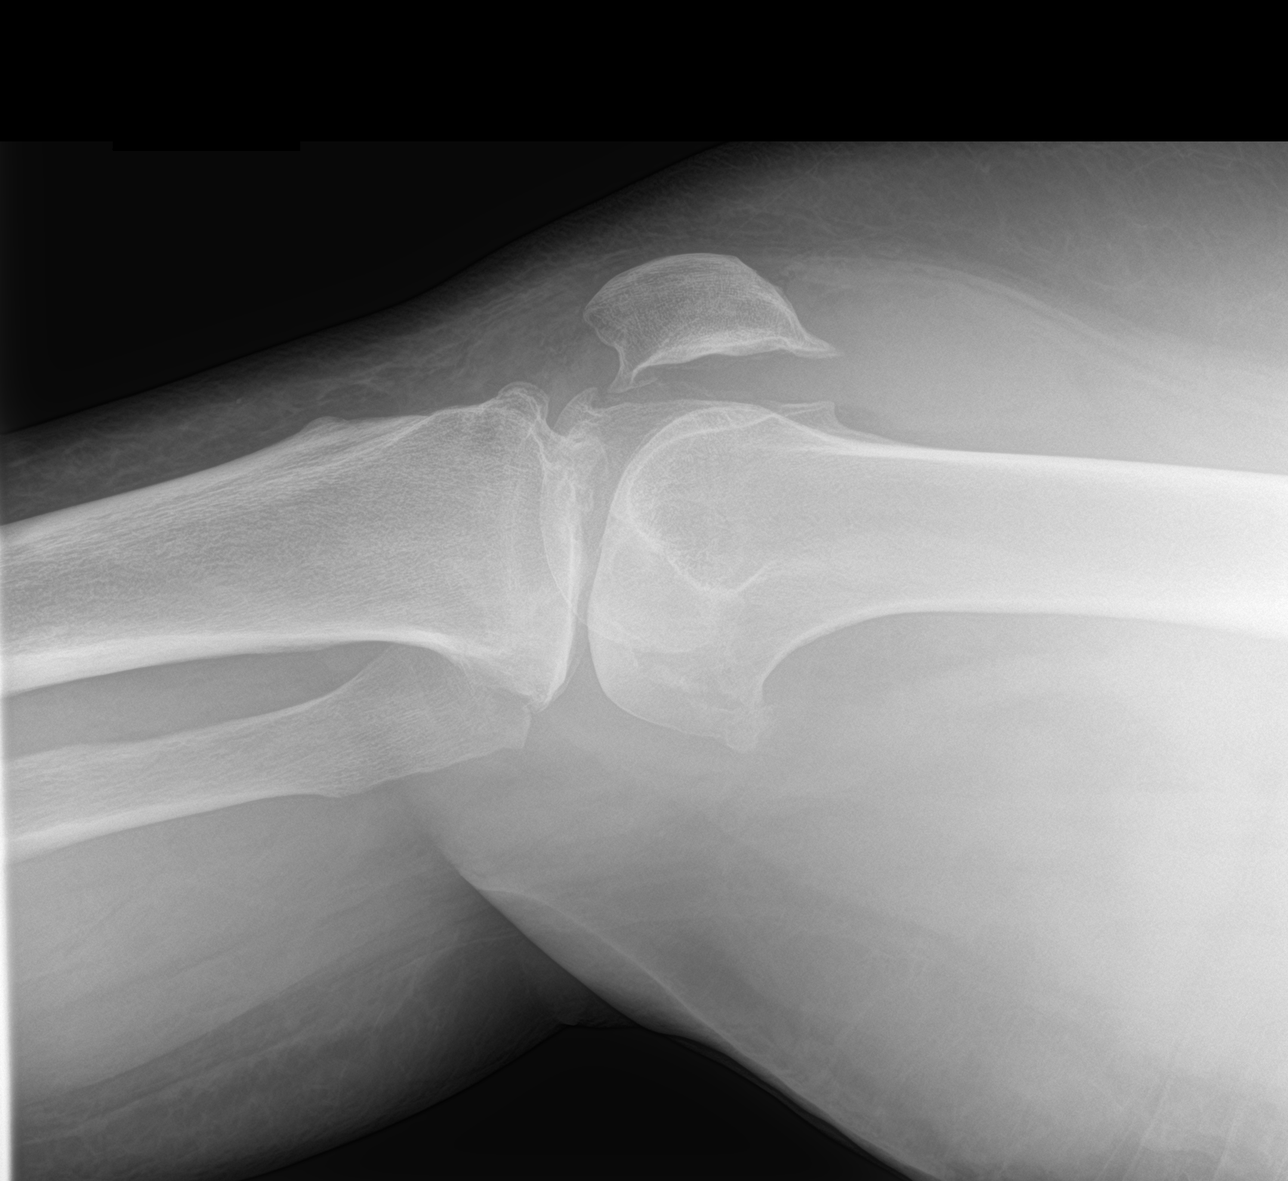

[knee obl (1 of 2)]
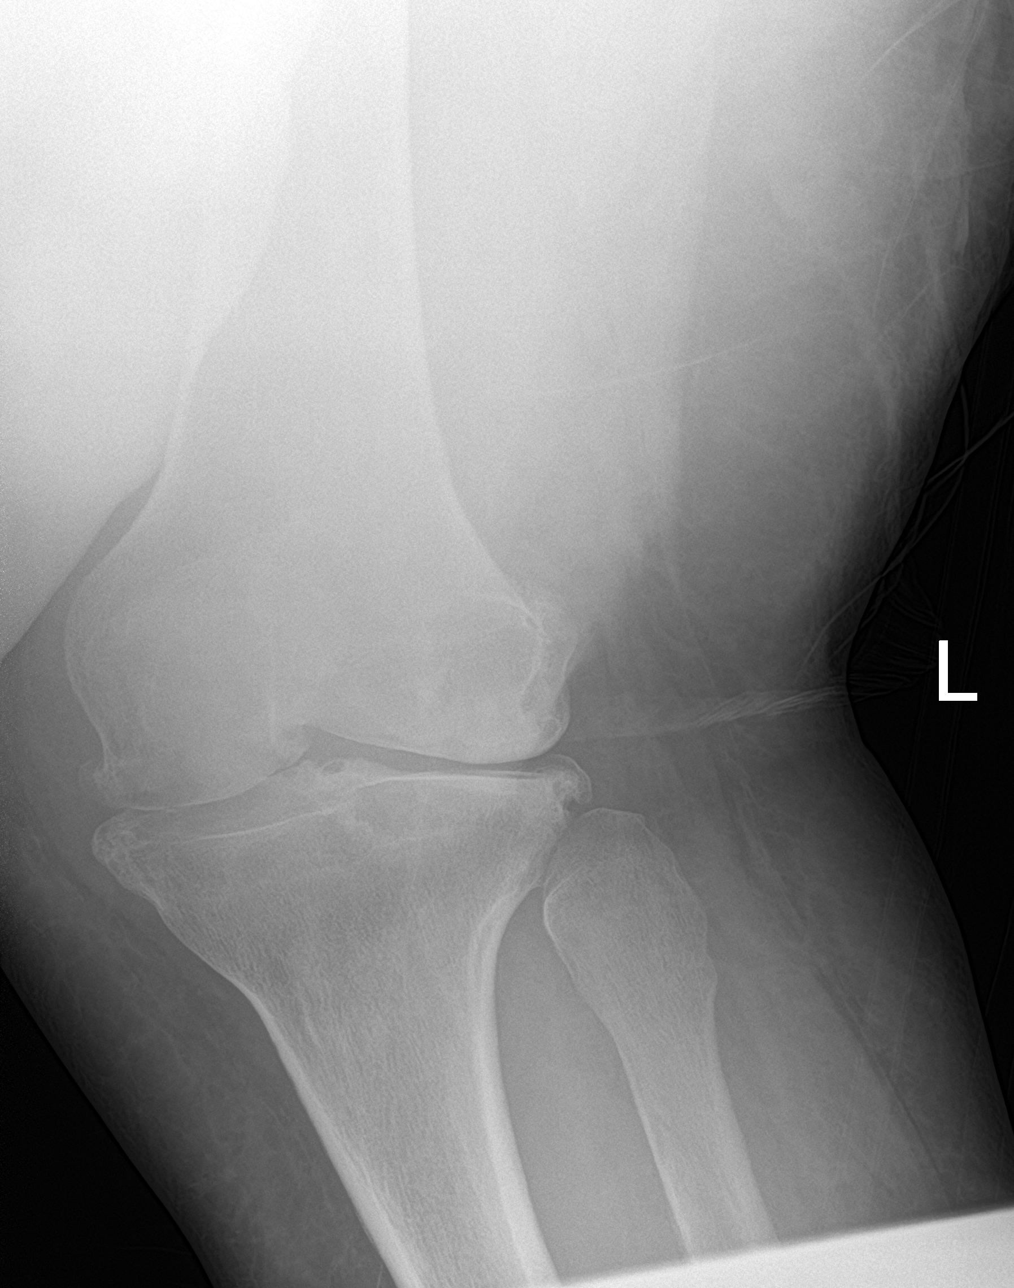

[knee obl (2 of 2)]
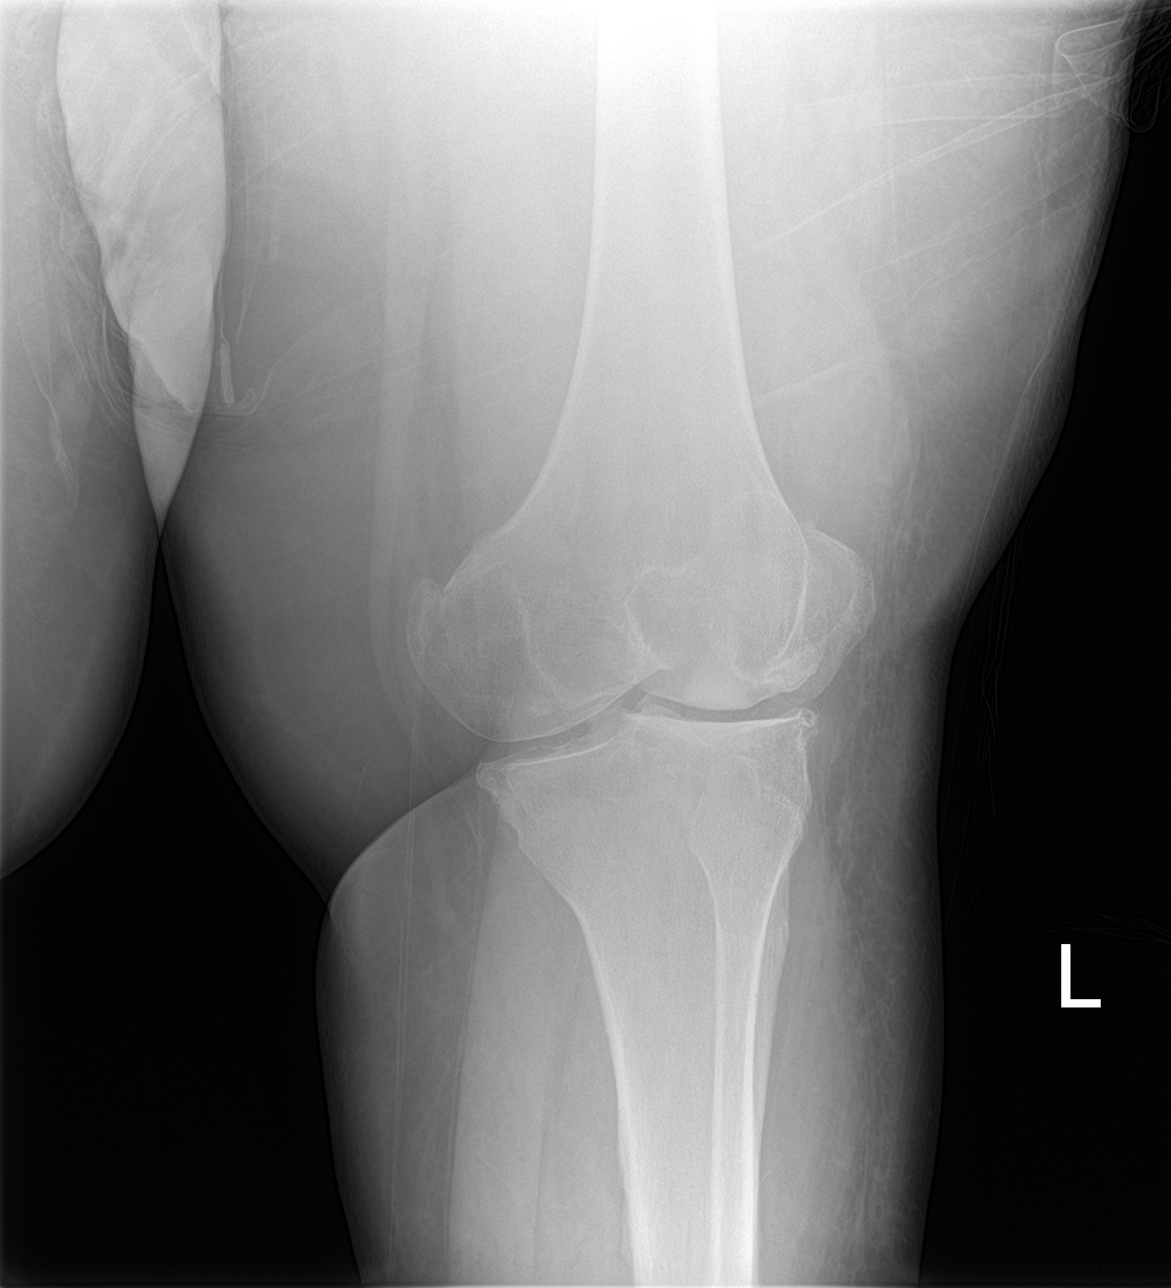

[4 of 4 positions shown; findings below may reference images not displayed]

FINDINGS: Tricompartmental degenerative spurring greatest at the lateral
compartment where there is sclerosis and probable mild joint space
narrowing. Knee joint effusion. No acute soft tissue finding.
IMPRESSION: 1. Large knee joint effusion.
2. Tricompartmental osteoarthritis.
3. No acute fracture.

## 2021-10-17 MED ORDER — BUPRENORPHINE HCL-NALOXONE HCL 2-0.5 MG SL SUBL
1.0000 | SUBLINGUAL_TABLET | Freq: Once | SUBLINGUAL | Status: AC
  Administered 2021-10-17: 1 via SUBLINGUAL
  Filled 2021-10-17: qty 1

## 2021-10-17 MED ORDER — VANCOMYCIN HCL 1750 MG/350ML IV SOLN
1750.0000 mg | Freq: Two times a day (BID) | INTRAVENOUS | Status: DC
  Filled 2021-10-17: qty 350

## 2021-10-17 MED ORDER — VANCOMYCIN HCL 10 G IV SOLR
2500.0000 mg | Freq: Once | INTRAVENOUS | Status: AC
  Administered 2021-10-17: 2500 mg via INTRAVENOUS
  Filled 2021-10-17: qty 2500

## 2021-10-17 MED ORDER — CIPROFLOXACIN IN D5W 400 MG/200ML IV SOLN
400.0000 mg | Freq: Once | INTRAVENOUS | Status: AC
  Administered 2021-10-17: 400 mg via INTRAVENOUS
  Filled 2021-10-17: qty 200

## 2021-10-17 NOTE — Progress Notes (Signed)
Pharmacy Antibiotic Note ? ?Derek Woods is a 31 y.o. male admitted on 10/17/2021 with  septic arthritis .  Pharmacy has been consulted for Vancomycin dosing. ? ?Plan: ?Vancomycin 2500 mg IV x 1, followed by 1750 mg IV q12h (eAUC 451, Scr 0.8, goal AUC 400-550) ?Follow-up clinical status, renal function ?Follow-up cultures, LOT, narrow as able ?Obtain Vancomycin levels as appropriate  ? ?Height: 5\' 6"  (167.6 cm) ?Weight: (!) 149.7 kg (330 lb) ?IBW/kg (Calculated) : 63.8 ? ?Temp (24hrs), Avg:99.3 ?F (37.4 ?C), Min:99.3 ?F (37.4 ?C), Max:99.3 ?F (37.4 ?C) ? ?No results for input(s): WBC, CREATININE, LATICACIDVEN, VANCOTROUGH, VANCOPEAK, VANCORANDOM, GENTTROUGH, GENTPEAK, GENTRANDOM, TOBRATROUGH, TOBRAPEAK, TOBRARND, AMIKACINPEAK, AMIKACINTROU, AMIKACIN in the last 168 hours.  ?CrCl cannot be calculated (Patient's most recent lab result is older than the maximum 21 days allowed.).   ? ?Allergies  ?Allergen Reactions  ? Augmentin [Amoxicillin-Pot Clavulanate] Other (See Comments)  ?  Pt does not recall, childhood  ? Ceclor [Cefaclor] Other (See Comments)  ?  Pt does not recall, childhood  ? Sulfa Antibiotics Hives  ? ? ?Antimicrobials this admission: ?Vancomycin 5/4 >>  ?Ciprofloxacin 5/4  ? ?Microbiology results: ?5/3 BCx: pending ? ? ?Thank you for allowing pharmacy to be a part of this patient?s care. ? ?7/3, PharmD ?PGY1 Pharmacy Resident ?10/17/2021 4:31 PM  ? ?Please check AMION for all [facility] Pharmacy phone numbers ?After 10:00 PM, call Main Pharmacy [phone] ? ? ?

## 2021-10-17 NOTE — ED Provider Notes (Signed)
I provided a substantive portion of the care of this patient.  I personally performed the entirety of the medical decision making for this encounter. ? ?   ? ?Presents with complaint of left knee pain and right ankle pain.  Ankle pain is starting to impact ambulation with significant pain but patient can weight-bear.  No associated injury.  However, patient does report distant injury that subsequently healed.  ? 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 ? ?Patient has not had fever, malaise or constitutional symptoms. ? ?Given concern for high risk of possible joint infection with active IV drug use, I recommend admission with IV antibiotics. ? ?Patient first wished to be discharged with oral antibiotics.  I did reinforce to the patient that due to drug use history there was risk of joint infection which could get significantly worse.  I felt that if he did want to leave it would need to be AMA.  That point patient did agree that he would stay for treatment. ?  ?Arby Barrette, MD ?10/21/21 1237 ? ?

## 2021-10-17 NOTE — ED Provider Triage Note (Signed)
Emergency Medicine Provider Triage Evaluation Note ? ?Derek Woods , a 31 y.o. male  was evaluated in triage.  Pt complains of right ankle/foot pain and left knee pain that began 1 week ago. Patient denies any trauma, fever, or chills. Patient having difficulty ambulating secondary to pain.  ? ?Review of Systems  ?Positive:  ?Negative: See above  ? ?Physical Exam  ?BP (!) 159/94 (BP Location: Right Arm)   Pulse 89   Temp 99.3 ?F (37.4 ?C) (Oral)   Resp 14   Ht 5\' 6"  (1.676 m)   Wt (!) 149.7 kg   SpO2 98%   BMI 53.26 kg/m?  ?Gen:   Awake, no distress, obese ?Resp:  Normal effort ?MSK:   Left knee has normal range of motion and is nontender to palpation. Right ankle is mildly swollen and tender to palpation. ROM is limited secondary to pain ?Other:   ? ?Medical Decision Making  ?Medically screening exam initiated at 10:58 AM.  Appropriate orders placed.  Derek Woods was informed that the remainder of the evaluation will be completed by another provider, this initial triage assessment does not replace that evaluation, and the importance of remaining in the ED until their evaluation is complete. ? ? ?  ?Derek Rubens, PA-C ?10/17/21 1101 ? ?

## 2021-10-17 NOTE — ED Triage Notes (Signed)
Patient BIB [facility] for complaint of left knee and right foot pain that started approximately one week ago, denies recent injury. Patient alert, oriented, and in no apparent distress at this time. ? ?EMS Vitals ?BP 210/120 ?

## 2021-10-17 NOTE — ED Notes (Signed)
IV team and this RN was unable to get blood draw/return for lab work/cultures. This RN started pt's antibiotics so it wasn't further delayed. EDP notified. ?

## 2021-10-17 NOTE — ED Notes (Signed)
IV team has been consulted about him needing an access.  ?

## 2021-10-17 NOTE — ED Notes (Signed)
[place] IV was attempted twice on this pt & he is refusing any more sticks for IV access. EDP has been made aware. He also reports to this RN that he will be leaving AMA soon.  ?

## 2021-10-17 NOTE — ED Provider Notes (Signed)
?[facility] ?Provider Note ? ? ?CSN: [id number] ?Arrival date & time: 10/17/21  1042 ? ?  ? ?History ? ?Chief Complaint  ?Patient presents with  ? Leg Pain  ? ? ?Derek Woods is a 31 y.o. male who presents to the ED complaining of right ankle/foot pain onset 1 week. Denies recent injury or trauma. Has been seen by Podiatry in the past and at the time was informed that he had a sprain of the right ankle. Pt also noted left knee pain ongoing 1 week. No injury or trauma. Has tried OTC medications without relief of his symptoms. Denies fever, chills. Pt with a history of IV heroin drug use.  Patient's last IV heroin drug use was yesterday at 5 PM. ? ? ?The history is provided by the patient. No language interpreter was used.  ? ?  ? ?Home Medications ?Prior to Admission medications   ?Medication Sig Start Date End Date Taking? Authorizing Provider  ?amLODipine (NORVASC) 5 MG tablet Take 1 tablet (5 mg total) by mouth daily. 11/27/20   Russella DarEllis, Allison L, NP  ?ferrous sulfate 325 (65 FE) MG tablet Take 1 tablet (325 mg total) by mouth 2 (two) times daily with a meal. 11/26/20   Russella DarEllis, Allison L, NP  ?Melatonin 10 MG TABS Take 10 mg by mouth at bedtime as needed (sleep).    [provider]  ?methocarbamol (ROBAXIN) 500 MG tablet Take 1 tablet (500 mg total) by mouth every 6 (six) hours as needed for muscle spasms. 11/26/20   Russella DarEllis, Allison L, NP  ?pregabalin (LYRICA) 50 MG capsule Take 1 capsule (50 mg total) by mouth at bedtime. 11/26/20   Russella DarEllis, Allison L, NP  ?   ? ?Allergies    ?Augmentin [amoxicillin-pot clavulanate], Ceclor [cefaclor], and Sulfa antibiotics   ? ?Review of Systems   ?Review of Systems  ?Constitutional:  Negative for chills and fever.  ?Musculoskeletal:  Positive for arthralgias and joint swelling.  ?Skin:  Positive for color change. Negative for wound.  ?All other systems reviewed and are negative. ? ?Physical Exam ?Updated Vital Signs ?BP (!) 157/88 (BP  Location: Right Arm)   Pulse 97   Temp 99.3 ?F (37.4 ?C) (Oral)   Resp 20   Ht 5\' 6"  (1.676 m)   Wt (!) 149.7 kg   SpO2 98%   BMI 53.26 kg/m?  ?Physical Exam ?Vitals and nursing note reviewed.  ?Constitutional:   ?   General: He is not in acute distress. ?   Appearance: Normal appearance.  ?Eyes:  ?   General: No scleral icterus. ?   Extraocular Movements: Extraocular movements intact.  ?Cardiovascular:  ?   Rate and Rhythm: Normal rate.  ?Pulmonary:  ?   Effort: Pulmonary effort is normal. No respiratory distress.  ?Musculoskeletal:  ?   Cervical back: Neck supple.  ?   Comments: Mild tenderness to palpation to right lateral aspect of right ankle.  With erythema and increased warmth noted to the area.  No obvious deformity noted.  Mild swelling noted to right ankle.  Decreased range of motion of right ankle secondary to pain.  Pedal pulses intact.  Left knee with normal flexion and extension against resistance.  Nontender to palpation.  ?Skin: ?   General: Skin is warm and dry.  ?   Findings: No bruising, erythema or rash.  ?Neurological:  ?   Mental Status: He is alert.  ?Psychiatric:     ?   Behavior: Behavior  normal.  ? ? ? ? ? ?ED Results / Procedures / Treatments   ?Labs ?(all labs ordered are listed, but only abnormal results are displayed) ?Labs Reviewed - No data to display ? ?EKG ?None ? ?Radiology ?DG Ankle Complete Right ? ?Result Date: 10/17/2021 ?CLINICAL DATA:  Pain right ankle EXAM: RIGHT ANKLE - COMPLETE 3 VIEW COMPARISON:  None Available. FINDINGS: No fracture or dislocation is seen. Small bony spurs are noted at the tips of medial and lateral malleoli. Plantar spur is seen in calcaneus. Bony spurs are noted in the dorsal aspect of intertarsal joints. There is soft tissue swelling around the ankle, more so over the lateral malleolus. IMPRESSION: No recent fracture or dislocation is seen in the right ankle. Electronically Signed   By: Ernie Avena M.D.   On: 10/17/2021 11:23  ? ?DG Knee  Complete 4 Views Left ? ?Result Date: 10/17/2021 ?CLINICAL DATA:  Leg pain EXAM: LEFT KNEE - COMPLETE 4+ VIEW COMPARISON:  None Available. FINDINGS: Tricompartmental degenerative spurring greatest at the lateral compartment where there is sclerosis and probable mild joint space narrowing. Knee joint effusion. No acute soft tissue finding. IMPRESSION: 1. Large knee joint effusion. 2. Tricompartmental osteoarthritis. 3. No acute fracture. Electronically Signed   By: Tiburcio Pea M.D.   On: 10/17/2021 11:22  ? ?DG Foot Complete Right ? ?Result Date: 10/17/2021 ?CLINICAL DATA:  Pain EXAM: RIGHT FOOT COMPLETE - 3+ VIEW COMPARISON:  None Available. FINDINGS: No displaced fracture or dislocation is seen. There are large bony spurs along the medial margin of navicular and medial cuneiform. 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 There are bony spurs in the dorsal aspect of intertarsal and tarsometatarsal joints. Degenerative changes are noted in the fifth tarsometatarsal joint. Plantar spur is seen in calcaneus. Small bony spurs seen in first metatarsophalangeal joint. There is soft tissue swelling over the dorsum. IMPRESSION: No recent fracture is seen. Degenerative changes are noted in multiple joints as described in the body of the report. 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 Electronically Signed   By: Ernie Avena M.D.   On: 10/17/2021 11:27   ? ?Procedures ?Procedures  ? ? ?Medications Ordered in ED ?Medications - No data to display ? ?ED Course/ Medical Decision Making/ A&P ?Clinical Course as of 10/18/21 0013  ?Thu Oct 17, 2021  ?1627 Attending evaluated patient who agrees with treatment plan. [SB]  ?  ?Clinical Course User Index ?[SB] Gilda Abboud A, PA-C  ? ?                        ?Medical Decision Making ?Amount and/or Complexity of Data Reviewed ?Labs: ordered. ?Radiology: ordered. Decision-making details documented in ED  Course. ? ?Risk ?Prescription drug management. ? ? ?Patient with right ankle/foot pain onset 1 week. Denies recent injury or trauma. [name] an IV drug user (Heroin) with his last use being yesterday at 5 PM. Vitals stable, pt afebrile, not tachycardic or hypoxic.  On exam patient with mild tenderness to palpation noted to lateral aspect of right ankle with erythema, swelling, increased warmth noted to the area.  No obvious deformity noted.  Decreased range of motion of right ankle secondary to pain.  Pedal pulses intact.  Left knee with normal flexion and extension against resistance and nontender to palpation.  No erythema or increased warmth noted to the left knee. Differential diagnosis includes fracture, dislocation, sprain, septic arthritis, osteoarthritis.  ? ?  Labs:  ?I ordered, and personally interpreted labs.  The pertinent results include:   ?Lactic acid, CBC, CMP ordered with results pending at time of signout. ?Blood cultures ordered with results pending at time of signout. ? ?Imaging: ?I ordered imaging studies including right foot, right ankle, left knee ?I independently visualized and interpreted imaging which showed: Left knee: Large joint effusion, no fracture, tricompartmental osteoarthritis. ?Right foot and right ankle without recent fracture or dislocation. ?I agree with the radiologist interpretation ? ?Medications:  ?I ordered medication including vancomycin and Cipro for antibiotic prophylaxis  ?Pt also given Suboxone in the ED due to COWS score of 9 and IV heroin drug use with his last use being at 5 pm yesterday.   ?I have reviewed the patients home medicines and have made adjustments as needed ? ?Patient case discussed with Dr. Bernette Mayers, at sign-out. Plan at sign-out is pending labs and discharge home if no concerning findings on labs with antibiotics. However, plan may change as per oncoming team recommendations. Patient care transferred at sign out.  ? ? ?This chart was dictated using voice  recognition software, Dragon. Despite the best efforts of this provider to proofread and correct errors, errors may still occur which can change documentation meaning. ? ?Final Clinical Impression(s) / ED Diagnoses ?Final dia

## 2021-10-18 LAB — CBC WITH DIFFERENTIAL/PLATELET
Abs Immature Granulocytes: 0.03 10*3/uL (ref 0.00–0.07)
Basophils Absolute: 0 10*3/uL (ref 0.0–0.1)
Basophils Relative: 0 %
Eosinophils Absolute: 0.1 10*3/uL (ref 0.0–0.5)
Eosinophils Relative: 2 %
HCT: 34.8 % — ABNORMAL LOW (ref 39.0–52.0)
Hemoglobin: 11.5 g/dL — ABNORMAL LOW (ref 13.0–17.0)
Immature Granulocytes: 0 %
Lymphocytes Relative: 23 %
Lymphs Abs: 1.6 10*3/uL (ref 0.7–4.0)
MCH: 30.5 pg (ref 26.0–34.0)
MCHC: 33 g/dL (ref 30.0–36.0)
MCV: 92.3 fL (ref 80.0–100.0)
Monocytes Absolute: 0.2 10*3/uL (ref 0.1–1.0)
Monocytes Relative: 3 %
Neutro Abs: 4.9 10*3/uL (ref 1.7–7.7)
Neutrophils Relative %: 72 %
Platelets: 282 10*3/uL (ref 150–400)
RBC: 3.77 MIL/uL — ABNORMAL LOW (ref 4.22–5.81)
RDW: 13.4 % (ref 11.5–15.5)
WBC: 6.9 10*3/uL (ref 4.0–10.5)
nRBC: 0 % (ref 0.0–0.2)

## 2021-10-18 LAB — COMPREHENSIVE METABOLIC PANEL
ALT: 10 U/L (ref 0–44)
AST: 15 U/L (ref 15–41)
Albumin: 2.8 g/dL — ABNORMAL LOW (ref 3.5–5.0)
Alkaline Phosphatase: 124 U/L (ref 38–126)
Anion gap: 9 (ref 5–15)
BUN: 11 mg/dL (ref 6–20)
CO2: 24 mmol/L (ref 22–32)
Calcium: 9.1 mg/dL (ref 8.9–10.3)
Chloride: 103 mmol/L (ref 98–111)
Creatinine, Ser: 0.97 mg/dL (ref 0.61–1.24)
GFR, Estimated: >60 mL/min (ref >60)
Glucose, Bld: 113 mg/dL — ABNORMAL HIGH (ref 70–99)
Potassium: 4.3 mmol/L (ref 3.5–5.1)
Sodium: 136 mmol/L (ref 135–145)
Total Bilirubin: 0.4 mg/dL (ref 0.3–1.2)
Total Protein: 7.4 g/dL (ref 6.5–8.1)

## 2021-10-18 LAB — LACTIC ACID, PLASMA: Lactic Acid, Venous: 1 mmol/L (ref 0.5–1.9)

## 2021-10-18 MED ORDER — CLINDAMYCIN HCL 150 MG PO CAPS: 300.0000 mg | ORAL_CAPSULE | Freq: Four times a day (QID) | ORAL | 0 refills | Status: DC

## 2021-10-18 NOTE — ED Notes (Signed)
Patient verbalizes understanding of d/c instructions. Opportunities for questions and answers were provided. Pt d/c from ED and wheeled to lobby. Pt's friend will be picking pt up. ?

## 2021-10-18 NOTE — Progress Notes (Signed)
Orthopedic Tech Progress Note ?Patient Details:  ?Derek Woods ?07-03-1990 ?[id number] ? ?Ortho Devices ?Type of Ortho Device: Knee Sleeve, ASO ?Ortho Device/Splint Location: lle,rle ?Ortho Device/Splint Interventions: Ordered, Adjustment, Application ?  ?Post Interventions ?Patient Tolerated: Well ? ?Derek Woods ?10/18/2021, 1:46 AM ? ?

## 2021-10-18 NOTE — ED Provider Notes (Signed)
Care of the patient assumed at the change of shift. Has history of IVDA and here for R foot cellulitis. Nursing reports difficulty with obtaining blood samples, has IV for Abx.  ?Physical Exam  ?BP 103/67   Pulse 99   Temp 99.3 ?F (37.4 ?C) (Oral)   Resp 18   Ht 5\' 6"  (1.676 m)   Wt (!) 149.7 kg   SpO2 100%   BMI 53.26 kg/m?  ? ?Physical Exam ? ?Procedures  ?Procedures ? ?Patient gives verbal consent for femoral stick. R groin cleaned with chlorhexidine, R femoral vein identified under ultrasound guidance and 10cc of blood drawn using an 18ga straight needle.  ? ?ED Course / MDM  ? ?Clinical Course as of 10/18/21 0445  ?Thu Oct 17, 2021  ?1627 Attending evaluated patient who agrees with treatment plan. [SB]  ?Fri Oct 18, 2021  ?0109 CBC with normal WBC.  [CS]  ?0116 CMP and lactic acid are normal.  [CS]  ?0151 Plan discharge with oral abx. L knee pain/effusion without outward signs of infection, no fever, FROM and normal WBC. No indication for emergent arthrocentesis. Refer to Ortho.  [CS]  ?  ?Clinical Course User Index ?[CS] 0117, MD ?[SB] Blue, Soijett A, PA-C  ? ?Medical Decision Making ?Problems Addressed: ?Acute pain of left knee: acute illness or injury ?Cellulitis of right foot: acute illness or injury ? ?Amount and/or Complexity of Data Reviewed ?Labs: ordered. Decision-making details documented in ED Course. ?Radiology: ordered and independent interpretation performed. Decision-making details documented in ED Course. ? ?Risk ?Prescription drug management. ? ? ? ? ? ? ? ?  ?Pollyann Savoy, MD ?10/18/21 [phone] ? ?

## 2021-10-18 NOTE — ED Notes (Signed)
OT called and will be here shortly to apply ortho devices to pt. ?

## 2021-10-20 ENCOUNTER — Telehealth (HOSPITAL_BASED_OUTPATIENT_CLINIC_OR_DEPARTMENT_OTHER): Payer: Self-pay | Admitting: Emergency Medicine

## 2021-10-20 LAB — BLOOD CULTURE ID PANEL (REFLEXED) - BCID2

## 2021-10-25 LAB — CULTURE, BLOOD (ROUTINE X 2)

## 2021-10-26 ENCOUNTER — Telehealth (HOSPITAL_BASED_OUTPATIENT_CLINIC_OR_DEPARTMENT_OTHER): Payer: Self-pay | Admitting: *Deleted

## 2021-10-26 NOTE — Progress Notes (Signed)
ED Antimicrobial Stewardship Positive Culture Follow Up  ? ?Derek Woods is an 31 y.o. male who presented to [facility] on 10/17/2021 with a chief complaint of  ?Chief Complaint  ?Patient presents with  ? Leg Pain  ? ? ?Recent Results (from the past 720 hour(s))  ?Culture, blood (routine x 2)     Status: Abnormal  ? Collection Time: 10/18/21 12:23 AM  ? Specimen: BLOOD  ?Result Value Ref Range Status  ? Specimen Description BLOOD FEMORAL ARTERY  Final  ? Special Requests   Final  ?  BOTTLES DRAWN AEROBIC AND ANAEROBIC Blood Culture results may not be optimal due to an inadequate volume of blood received in culture bottles  ? Culture  Setup Time   Final  ?  GRAM POSITIVE COCCI ?ANAEROBIC BOTTLE ONLY ?CRITICAL RESULT CALLED TO, READ BACK BY AND VERIFIED WITH: ?RN KELLY NEAL 10/20/21@5 :32 BY TW ?IN BOTH AEROBIC AND ANAEROBIC BOTTLES ?Performed at [facility], [place] [address] [place], [place] [address] ?  ? Culture (A)  Final  ?  PEPTOSTREPTOCOCCUS SPECIES ?CAMPYLOBACTER SPECIES ?  ? Report Status 10/25/2021 FINAL  Final  ?Blood Culture ID Panel (Reflexed)     Status: None  ? Collection Time: 10/18/21 12:23 AM  ?Result Value Ref Range Status  ? Enterococcus faecalis NOT DETECTED NOT DETECTED Final  ? Enterococcus Faecium NOT DETECTED NOT DETECTED Final  ? Listeria monocytogenes NOT DETECTED NOT DETECTED Final  ? Staphylococcus species NOT DETECTED NOT DETECTED Final  ? Staphylococcus aureus (BCID) NOT DETECTED NOT DETECTED Final  ? Staphylococcus epidermidis NOT DETECTED NOT DETECTED Final  ? Staphylococcus lugdunensis NOT DETECTED NOT DETECTED Final  ? Streptococcus species NOT DETECTED NOT DETECTED Final  ? Streptococcus agalactiae NOT DETECTED NOT DETECTED Final  ? Streptococcus pneumoniae NOT DETECTED NOT DETECTED Final  ? Streptococcus pyogenes NOT DETECTED NOT DETECTED Final  ? A.calcoaceticus-baumannii NOT DETECTED NOT DETECTED Final  ? Bacteroides fragilis NOT DETECTED NOT DETECTED Final  ? Enterobacterales  NOT DETECTED NOT DETECTED Final  ? Enterobacter cloacae complex NOT DETECTED NOT DETECTED Final  ? Escherichia coli NOT DETECTED NOT DETECTED Final  ? Klebsiella aerogenes NOT DETECTED NOT DETECTED Final  ? Klebsiella oxytoca NOT DETECTED NOT DETECTED Final  ? Klebsiella pneumoniae NOT DETECTED NOT DETECTED Final  ? Proteus species NOT DETECTED NOT DETECTED Final  ? Salmonella species NOT DETECTED NOT DETECTED Final  ? Serratia marcescens NOT DETECTED NOT DETECTED Final  ? Haemophilus influenzae NOT DETECTED NOT DETECTED Final  ? Neisseria meningitidis NOT DETECTED NOT DETECTED Final  ? Pseudomonas aeruginosa NOT DETECTED NOT DETECTED Final  ? Stenotrophomonas maltophilia NOT DETECTED NOT DETECTED Final  ? Candida albicans NOT DETECTED NOT DETECTED Final  ? Candida auris NOT DETECTED NOT DETECTED Final  ? Candida glabrata NOT DETECTED NOT DETECTED Final  ? Candida krusei NOT DETECTED NOT DETECTED Final  ? Candida parapsilosis NOT DETECTED NOT DETECTED Final  ? Candida tropicalis NOT DETECTED NOT DETECTED Final  ? Cryptococcus neoformans/gattii NOT DETECTED NOT DETECTED Final  ?  Comment: Performed at [facility], [address], [place], [place] [address]  ? ? ?Patient placement to call patient and advise him to return to the ED for IV antibiotic treatment.  ? ?ED Provider: Myna Bright, PA-C  ? ? ?Albertina Parr, PharmD., BCCCP ?Clinical Pharmacist ?Please refer to [facility] for unit-specific pharmacist  ? ?

## 2022-08-27 NOTE — Telephone Encounter (Signed)
done

## 2022-08-29 ENCOUNTER — Encounter (HOSPITAL_COMMUNITY): Payer: Self-pay

## 2022-08-31 ENCOUNTER — Emergency Department (HOSPITAL_COMMUNITY): Payer: Medicaid Other

## 2022-08-31 ENCOUNTER — Encounter (HOSPITAL_COMMUNITY): Payer: Self-pay

## 2022-08-31 ENCOUNTER — Other Ambulatory Visit: Payer: Self-pay

## 2022-08-31 ENCOUNTER — Inpatient Hospital Stay (HOSPITAL_COMMUNITY)
Admission: EM | Admit: 2022-08-31 | Discharge: 2022-09-08 | DRG: 853 | Disposition: A | Discharge status: Home / Self Care | Payer: Medicaid Other | Attending: Internal Medicine | Admitting: Internal Medicine

## 2022-08-31 DIAGNOSIS — F17200 Nicotine dependence, unspecified, uncomplicated: Secondary | ICD-10-CM | POA: Diagnosis present

## 2022-08-31 DIAGNOSIS — J918 Pleural effusion in other conditions classified elsewhere: Secondary | ICD-10-CM | POA: Diagnosis present

## 2022-08-31 DIAGNOSIS — F1990 Other psychoactive substance use, unspecified, uncomplicated: Secondary | ICD-10-CM

## 2022-08-31 DIAGNOSIS — R12 Heartburn: Secondary | ICD-10-CM | POA: Diagnosis present

## 2022-08-31 DIAGNOSIS — Z91199 Patient's noncompliance with other medical treatment and regimen due to unspecified reason: Secondary | ICD-10-CM

## 2022-08-31 DIAGNOSIS — E8809 Other disorders of plasma-protein metabolism, not elsewhere classified: Secondary | ICD-10-CM | POA: Diagnosis present

## 2022-08-31 DIAGNOSIS — F17210 Nicotine dependence, cigarettes, uncomplicated: Secondary | ICD-10-CM | POA: Diagnosis present

## 2022-08-31 DIAGNOSIS — Z880 Allergy status to penicillin: Secondary | ICD-10-CM

## 2022-08-31 DIAGNOSIS — Z6841 Body Mass Index (BMI) 40.0 and over, adult: Secondary | ICD-10-CM

## 2022-08-31 DIAGNOSIS — Z1152 Encounter for screening for COVID-19: Secondary | ICD-10-CM

## 2022-08-31 DIAGNOSIS — D62 Acute posthemorrhagic anemia: Secondary | ICD-10-CM | POA: Diagnosis not present

## 2022-08-31 DIAGNOSIS — J9601 Acute respiratory failure with hypoxia: Secondary | ICD-10-CM | POA: Diagnosis present

## 2022-08-31 DIAGNOSIS — F1920 Other psychoactive substance dependence, uncomplicated: Secondary | ICD-10-CM | POA: Diagnosis present

## 2022-08-31 DIAGNOSIS — A419 Sepsis, unspecified organism: Principal | ICD-10-CM | POA: Diagnosis present

## 2022-08-31 DIAGNOSIS — J869 Pyothorax without fistula: Secondary | ICD-10-CM | POA: Diagnosis present

## 2022-08-31 DIAGNOSIS — J45909 Unspecified asthma, uncomplicated: Secondary | ICD-10-CM | POA: Diagnosis present

## 2022-08-31 DIAGNOSIS — F419 Anxiety disorder, unspecified: Secondary | ICD-10-CM | POA: Diagnosis present

## 2022-08-31 DIAGNOSIS — J9811 Atelectasis: Secondary | ICD-10-CM | POA: Diagnosis present

## 2022-08-31 DIAGNOSIS — Z560 Unemployment, unspecified: Secondary | ICD-10-CM

## 2022-08-31 HISTORY — DX: Inflammatory liver disease, unspecified: K75.9

## 2022-08-31 LAB — CBC WITH DIFFERENTIAL/PLATELET
Abs Immature Granulocytes: 0.02 10*3/uL (ref 0.00–0.07)
Basophils Absolute: 0.1 10*3/uL (ref 0.0–0.1)
Basophils Relative: 1 %
Eosinophils Absolute: 0.4 10*3/uL (ref 0.0–0.5)
Eosinophils Relative: 7 %
HCT: 38.1 % — ABNORMAL LOW (ref 39.0–52.0)
Hemoglobin: 12.7 g/dL — ABNORMAL LOW (ref 13.0–17.0)
Immature Granulocytes: 0 %
Lymphocytes Relative: 22 %
Lymphs Abs: 1.5 10*3/uL (ref 0.7–4.0)
MCH: 31.1 pg (ref 26.0–34.0)
MCHC: 33.3 g/dL (ref 30.0–36.0)
MCV: 93.4 fL (ref 80.0–100.0)
Monocytes Absolute: 0.2 10*3/uL (ref 0.1–1.0)
Monocytes Relative: 3 %
Neutro Abs: 4.6 10*3/uL (ref 1.7–7.7)
Neutrophils Relative %: 67 %
Platelets: 320 10*3/uL (ref 150–400)
RBC: 4.08 MIL/uL — ABNORMAL LOW (ref 4.22–5.81)
RDW: 13.8 % (ref 11.5–15.5)
WBC: 6.8 10*3/uL (ref 4.0–10.5)
nRBC: 0 % (ref 0.0–0.2)

## 2022-08-31 LAB — COMPREHENSIVE METABOLIC PANEL
ALT: 25 U/L (ref 0–44)
AST: 33 U/L (ref 15–41)
Albumin: 2.7 g/dL — ABNORMAL LOW (ref 3.5–5.0)
Alkaline Phosphatase: 155 U/L — ABNORMAL HIGH (ref 38–126)
Anion gap: 10 (ref 5–15)
BUN: 13 mg/dL (ref 6–20)
CO2: 23 mmol/L (ref 22–32)
Calcium: 8.5 mg/dL — ABNORMAL LOW (ref 8.9–10.3)
Chloride: 101 mmol/L (ref 98–111)
Creatinine, Ser: 0.96 mg/dL (ref 0.61–1.24)
GFR, Estimated: >60 mL/min (ref >60)
Glucose, Bld: 112 mg/dL — ABNORMAL HIGH (ref 70–99)
Potassium: 4 mmol/L (ref 3.5–5.1)
Sodium: 134 mmol/L — ABNORMAL LOW (ref 135–145)
Total Bilirubin: 0.3 mg/dL (ref 0.3–1.2)
Total Protein: 7.4 g/dL (ref 6.5–8.1)

## 2022-08-31 LAB — PROTIME-INR
INR: 1.1 (ref 0.8–1.2)
Prothrombin Time: 14 seconds (ref 11.4–15.2)

## 2022-08-31 LAB — LACTIC ACID, PLASMA: Lactic Acid, Venous: 1.8 mmol/L (ref 0.5–1.9)

## 2022-08-31 LAB — APTT: aPTT: 21 seconds — ABNORMAL LOW (ref 24–36)

## 2022-08-31 MED ORDER — SODIUM CHLORIDE 0.9 % IV SOLN
2.0000 g | Freq: Once | INTRAVENOUS | Status: AC
  Administered 2022-09-01: 2 g via INTRAVENOUS
  Filled 2022-08-31: qty 12.5

## 2022-08-31 MED ORDER — LACTATED RINGERS IV BOLUS (SEPSIS): 1000.0000 mL | Freq: Once | INTRAVENOUS | Status: DC

## 2022-08-31 MED ORDER — VANCOMYCIN HCL IN DEXTROSE 1-5 GM/200ML-% IV SOLN
1000.0000 mg | Freq: Once | INTRAVENOUS | Status: AC
  Administered 2022-09-01: 1000 mg via INTRAVENOUS
  Filled 2022-08-31: qty 200

## 2022-08-31 MED ORDER — LACTATED RINGERS IV BOLUS (SEPSIS)
1000.0000 mL | Freq: Once | INTRAVENOUS | Status: AC
  Administered 2022-09-01: 1000 mL via INTRAVENOUS

## 2022-08-31 MED ORDER — LACTATED RINGERS IV SOLN: INTRAVENOUS | Status: DC

## 2022-08-31 MED ORDER — VANCOMYCIN HCL IN DEXTROSE 1-5 GM/200ML-% IV SOLN: 1000.0000 mg | Freq: Once | INTRAVENOUS | Status: DC

## 2022-08-31 MED ORDER — LACTATED RINGERS IV BOLUS (SEPSIS): 500.0000 mL | Freq: Once | INTRAVENOUS | Status: DC

## 2022-08-31 NOTE — Progress Notes (Signed)
Pt being followed by [facility] for Sepsis protocol. 

## 2022-08-31 NOTE — ED Provider Triage Note (Signed)
Emergency Medicine Provider Triage Evaluation Note  Derek Woods , a 32 y.o. male  was evaluated in triage.  Pt complains of shortness of breath.  Recently admitted to [facility] for pneumonia but he left prior to being discharged because he felt better.  History of asthma.  Denies any current chest pain or palpitations.  Patient is tachypneic.  Overall ill-appearing.  Oxygen saturations in the 80s however improved to the 90s with oxygen.  Patient is alert and providing his own history however he does meet SIRS criteria.  Will initiate sepsis workup with a presumed pulmonary source.  Nursing understands patient needs the next room.  Review of Systems  Positive:  Negative:   Physical Exam  BP (!) 161/97   Pulse (!) 112   Temp 99 F (37.2 C)   Resp (!) 22   Ht 5\' 6"  (1.676 m)   Wt (!) 138.3 kg   SpO2 (!) 84%   BMI 49.23 kg/m  Gen:   Awake, no distress   Resp:  Normal effort  MSK:   Moves extremities without difficulty  Other:  Tachypneic with a cough that does sound dry.  Scattered wheezing throughout  Medical Decision Making  Medically screening exam initiated at 11:01 PM.  Appropriate orders placed.  Derek Woods was informed that the remainder of the evaluation will be completed by another provider, this initial triage assessment does not replace that evaluation, and the importance of remaining in the ED until their evaluation is complete.     Derek Woods, [place] 08/31/22 2303

## 2022-08-31 NOTE — ED Notes (Signed)
Attempted to draw blood cultures and start IV unsuccessfully. IV team requested.

## 2022-08-31 NOTE — ED Triage Notes (Signed)
Pt arrived to triage complaining of shortness of breath, pt states that he was admitted for pneumonia and [place] but left AMA because he was feeling better.   On arrival to ED his O2 was 84% on RA.  Pt placed on 3L [place] with improvements 92%   Hx of asthma with increased inhaler use   [name] diaphoretic, breathing fast and pale.

## 2022-09-01 ENCOUNTER — Encounter (HOSPITAL_COMMUNITY): Payer: Self-pay | Admitting: Internal Medicine

## 2022-09-01 ENCOUNTER — Inpatient Hospital Stay (HOSPITAL_COMMUNITY): Payer: Medicaid Other

## 2022-09-01 ENCOUNTER — Other Ambulatory Visit (HOSPITAL_COMMUNITY): Payer: Medicaid Other

## 2022-09-01 DIAGNOSIS — Z6841 Body Mass Index (BMI) 40.0 and over, adult: Secondary | ICD-10-CM | POA: Diagnosis not present

## 2022-09-01 DIAGNOSIS — A419 Sepsis, unspecified organism: Secondary | ICD-10-CM | POA: Diagnosis present

## 2022-09-01 DIAGNOSIS — J9811 Atelectasis: Secondary | ICD-10-CM | POA: Diagnosis present

## 2022-09-01 DIAGNOSIS — F17210 Nicotine dependence, cigarettes, uncomplicated: Secondary | ICD-10-CM | POA: Diagnosis present

## 2022-09-01 DIAGNOSIS — D62 Acute posthemorrhagic anemia: Secondary | ICD-10-CM | POA: Diagnosis not present

## 2022-09-01 DIAGNOSIS — J45909 Unspecified asthma, uncomplicated: Secondary | ICD-10-CM | POA: Diagnosis present

## 2022-09-01 DIAGNOSIS — J869 Pyothorax without fistula: Secondary | ICD-10-CM | POA: Diagnosis present

## 2022-09-01 DIAGNOSIS — I38 Endocarditis, valve unspecified: Secondary | ICD-10-CM | POA: Diagnosis not present

## 2022-09-01 DIAGNOSIS — I10 Essential (primary) hypertension: Secondary | ICD-10-CM | POA: Diagnosis not present

## 2022-09-01 DIAGNOSIS — J918 Pleural effusion in other conditions classified elsewhere: Secondary | ICD-10-CM | POA: Diagnosis present

## 2022-09-01 DIAGNOSIS — E8809 Other disorders of plasma-protein metabolism, not elsewhere classified: Secondary | ICD-10-CM | POA: Diagnosis present

## 2022-09-01 DIAGNOSIS — F419 Anxiety disorder, unspecified: Secondary | ICD-10-CM | POA: Diagnosis present

## 2022-09-01 DIAGNOSIS — J449 Chronic obstructive pulmonary disease, unspecified: Secondary | ICD-10-CM | POA: Diagnosis not present

## 2022-09-01 DIAGNOSIS — Z1152 Encounter for screening for COVID-19: Secondary | ICD-10-CM | POA: Diagnosis not present

## 2022-09-01 DIAGNOSIS — Z91199 Patient's noncompliance with other medical treatment and regimen due to unspecified reason: Secondary | ICD-10-CM | POA: Diagnosis not present

## 2022-09-01 DIAGNOSIS — R12 Heartburn: Secondary | ICD-10-CM | POA: Diagnosis present

## 2022-09-01 DIAGNOSIS — F1920 Other psychoactive substance dependence, uncomplicated: Secondary | ICD-10-CM | POA: Diagnosis not present

## 2022-09-01 DIAGNOSIS — Z880 Allergy status to penicillin: Secondary | ICD-10-CM | POA: Diagnosis not present

## 2022-09-01 DIAGNOSIS — J9601 Acute respiratory failure with hypoxia: Secondary | ICD-10-CM | POA: Diagnosis present

## 2022-09-01 DIAGNOSIS — F1990 Other psychoactive substance use, unspecified, uncomplicated: Secondary | ICD-10-CM | POA: Diagnosis not present

## 2022-09-01 DIAGNOSIS — Z560 Unemployment, unspecified: Secondary | ICD-10-CM | POA: Diagnosis not present

## 2022-09-01 LAB — URINALYSIS, W/ REFLEX TO CULTURE (INFECTION SUSPECTED)
Bacteria, UA: NONE SEEN
Bilirubin Urine: NEGATIVE
Glucose, UA: NEGATIVE mg/dL
Hgb urine dipstick: NEGATIVE
Ketones, ur: NEGATIVE mg/dL
Leukocytes,Ua: NEGATIVE
Nitrite: NEGATIVE
Protein, ur: 100 mg/dL — AB
Specific Gravity, Urine: 1.024 (ref 1.005–1.030)
pH: 5 (ref 5.0–8.0)

## 2022-09-01 LAB — RESP PANEL BY RT-PCR (RSV, FLU A&B, COVID)  RVPGX2
Influenza A by PCR: NEGATIVE
Influenza B by PCR: NEGATIVE
Resp Syncytial Virus by PCR: NEGATIVE
SARS Coronavirus 2 by RT PCR: NEGATIVE

## 2022-09-01 LAB — TYPE AND SCREEN
ABO/RH(D): O POS
Antibody Screen: NEGATIVE

## 2022-09-01 LAB — TROPONIN I (HIGH SENSITIVITY)
Troponin I (High Sensitivity): 8 ng/L (ref <18)
Troponin I (High Sensitivity): 6 ng/L (ref <18)

## 2022-09-01 LAB — RAPID URINE DRUG SCREEN, HOSP PERFORMED
Amphetamines: NOT DETECTED
Barbiturates: NOT DETECTED
Benzodiazepines: NOT DETECTED
Cocaine: NOT DETECTED
Opiates: POSITIVE — AB
Tetrahydrocannabinol: NOT DETECTED

## 2022-09-01 LAB — HIV ANTIBODY (ROUTINE TESTING W REFLEX): HIV Screen 4th Generation wRfx: NONREACTIVE

## 2022-09-01 LAB — LACTIC ACID, PLASMA: Lactic Acid, Venous: 1.1 mmol/L (ref 0.5–1.9)

## 2022-09-01 MED ORDER — SODIUM CHLORIDE 0.9 % IV SOLN
2.0000 g | Freq: Three times a day (TID) | INTRAVENOUS | Status: DC
  Administered 2022-09-01 – 2022-09-05 (×11): 2 g via INTRAVENOUS
  Filled 2022-09-01 (×13): qty 12.5

## 2022-09-01 MED ORDER — BUPRENORPHINE HCL-NALOXONE HCL 2-0.5 MG SL SUBL
2.0000 | SUBLINGUAL_TABLET | SUBLINGUAL | Status: AC | PRN
  Administered 2022-09-01 – 2022-09-02 (×2): 2 via SUBLINGUAL
  Filled 2022-09-01 (×2): qty 2

## 2022-09-01 MED ORDER — POLYETHYLENE GLYCOL 3350 17 G PO PACK: 17.0000 g | PACK | Freq: Every day | ORAL | Status: DC | PRN

## 2022-09-01 MED ORDER — HYDROXYZINE HCL 25 MG PO TABS
25.0000 mg | ORAL_TABLET | Freq: Four times a day (QID) | ORAL | Status: AC | PRN
  Administered 2022-09-01 – 2022-09-05 (×12): 25 mg via ORAL
  Filled 2022-09-01 (×12): qty 1

## 2022-09-01 MED ORDER — SODIUM CHLORIDE 0.9 % IV SOLN: 2.0000 g | Freq: Once | INTRAVENOUS | Status: DC

## 2022-09-01 MED ORDER — BISACODYL 5 MG PO TBEC: 5.0000 mg | DELAYED_RELEASE_TABLET | Freq: Every day | ORAL | Status: DC | PRN

## 2022-09-01 MED ORDER — ALBUTEROL SULFATE (2.5 MG/3ML) 0.083% IN NEBU
2.5000 mg | INHALATION_SOLUTION | RESPIRATORY_TRACT | Status: DC | PRN
  Administered 2022-09-01: 2.5 mg via RESPIRATORY_TRACT
  Filled 2022-09-01: qty 3

## 2022-09-01 MED ORDER — VANCOMYCIN HCL IN DEXTROSE 1-5 GM/200ML-% IV SOLN: 1000.0000 mg | Freq: Two times a day (BID) | INTRAVENOUS | Status: DC

## 2022-09-01 MED ORDER — IPRATROPIUM-ALBUTEROL 0.5-2.5 (3) MG/3ML IN SOLN
3.0000 mL | Freq: Once | RESPIRATORY_TRACT | Status: AC
  Administered 2022-09-01: 3 mL via RESPIRATORY_TRACT
  Filled 2022-09-01: qty 3

## 2022-09-01 MED ORDER — BUPRENORPHINE HCL-NALOXONE HCL 8-2 MG SL SUBL
1.0000 | SUBLINGUAL_TABLET | Freq: Two times a day (BID) | SUBLINGUAL | Status: DC
  Administered 2022-09-01 – 2022-09-08 (×14): 1 via SUBLINGUAL
  Filled 2022-09-01 (×14): qty 1

## 2022-09-01 MED ORDER — VANCOMYCIN HCL 1250 MG/250ML IV SOLN
1250.0000 mg | Freq: Two times a day (BID) | INTRAVENOUS | Status: DC
  Administered 2022-09-01 – 2022-09-04 (×8): 1250 mg via INTRAVENOUS
  Filled 2022-09-01 (×12): qty 250

## 2022-09-01 MED ORDER — ACETAMINOPHEN 325 MG PO TABS
650.0000 mg | ORAL_TABLET | Freq: Four times a day (QID) | ORAL | Status: DC | PRN
  Administered 2022-09-03 – 2022-09-08 (×11): 650 mg via ORAL
  Filled 2022-09-01 (×11): qty 2

## 2022-09-01 MED ORDER — SODIUM CHLORIDE 0.9 % IV SOLN: INTRAVENOUS | Status: DC

## 2022-09-01 MED ORDER — SODIUM CHLORIDE 0.9% FLUSH
3.0000 mL | Freq: Two times a day (BID) | INTRAVENOUS | Status: DC
  Administered 2022-09-01 – 2022-09-07 (×11): 3 mL via INTRAVENOUS

## 2022-09-01 MED ORDER — METHOCARBAMOL 500 MG PO TABS
500.0000 mg | ORAL_TABLET | Freq: Three times a day (TID) | ORAL | Status: AC | PRN
  Administered 2022-09-01 – 2022-09-05 (×10): 500 mg via ORAL
  Filled 2022-09-01 (×10): qty 1

## 2022-09-01 MED ORDER — KETOROLAC TROMETHAMINE 30 MG/ML IJ SOLN
30.0000 mg | Freq: Four times a day (QID) | INTRAMUSCULAR | Status: AC | PRN
  Administered 2022-09-01 – 2022-09-06 (×15): 30 mg via INTRAVENOUS
  Filled 2022-09-01 (×15): qty 1

## 2022-09-01 MED ORDER — LOPERAMIDE HCL 2 MG PO CAPS: 2.0000 mg | ORAL_CAPSULE | ORAL | Status: AC | PRN

## 2022-09-01 MED ORDER — ALBUTEROL SULFATE HFA 108 (90 BASE) MCG/ACT IN AERS: 2.0000 | INHALATION_SPRAY | Freq: Four times a day (QID) | RESPIRATORY_TRACT | Status: DC | PRN

## 2022-09-01 MED ORDER — CLONIDINE HCL 0.1 MG PO TABS
0.1000 mg | ORAL_TABLET | ORAL | Status: AC
  Administered 2022-09-03 – 2022-09-04 (×4): 0.1 mg via ORAL
  Filled 2022-09-01 (×4): qty 1

## 2022-09-01 MED ORDER — CLONIDINE HCL 0.1 MG PO TABS
0.1000 mg | ORAL_TABLET | Freq: Four times a day (QID) | ORAL | Status: AC
  Administered 2022-09-01 – 2022-09-02 (×6): 0.1 mg via ORAL
  Filled 2022-09-01 (×6): qty 1

## 2022-09-01 MED ORDER — ONDANSETRON HCL 4 MG/2ML IJ SOLN
4.0000 mg | Freq: Four times a day (QID) | INTRAMUSCULAR | Status: DC | PRN
  Administered 2022-09-01 – 2022-09-06 (×4): 4 mg via INTRAVENOUS
  Filled 2022-09-01 (×4): qty 2

## 2022-09-01 MED ORDER — ACETAMINOPHEN 650 MG RE SUPP: 650.0000 mg | Freq: Four times a day (QID) | RECTAL | Status: DC | PRN

## 2022-09-01 MED ORDER — ONDANSETRON HCL 4 MG PO TABS: 4.0000 mg | ORAL_TABLET | Freq: Four times a day (QID) | ORAL | Status: DC | PRN

## 2022-09-01 MED ORDER — NICOTINE 21 MG/24HR TD PT24
21.0000 mg | MEDICATED_PATCH | Freq: Every day | TRANSDERMAL | Status: DC
  Administered 2022-09-01 – 2022-09-08 (×8): 21 mg via TRANSDERMAL
  Filled 2022-09-01 (×8): qty 1

## 2022-09-01 MED ORDER — HYDRALAZINE HCL 20 MG/ML IJ SOLN: 5.0000 mg | INTRAMUSCULAR | Status: DC | PRN

## 2022-09-01 MED ORDER — GUAIFENESIN ER 600 MG PO TB12: 600.0000 mg | ORAL_TABLET | Freq: Two times a day (BID) | ORAL | Status: DC | PRN

## 2022-09-01 MED ORDER — CLONIDINE HCL 0.1 MG PO TABS
0.1000 mg | ORAL_TABLET | Freq: Every day | ORAL | Status: AC
  Administered 2022-09-05 – 2022-09-06 (×2): 0.1 mg via ORAL
  Filled 2022-09-01 (×2): qty 1

## 2022-09-01 MED ORDER — DICYCLOMINE HCL 20 MG PO TABS: 20.0000 mg | ORAL_TABLET | Freq: Four times a day (QID) | ORAL | Status: AC | PRN

## 2022-09-01 MED ORDER — BUPRENORPHINE HCL-NALOXONE HCL 8-2 MG SL SUBL: 1.0000 | SUBLINGUAL_TABLET | Freq: Two times a day (BID) | SUBLINGUAL | Status: DC

## 2022-09-01 MED ORDER — ENOXAPARIN SODIUM 40 MG/0.4ML IJ SOSY
40.0000 mg | PREFILLED_SYRINGE | INTRAMUSCULAR | Status: DC
  Administered 2022-09-01 – 2022-09-02 (×2): 40 mg via SUBCUTANEOUS
  Filled 2022-09-01 (×2): qty 0.4

## 2022-09-01 NOTE — Progress Notes (Signed)
Pharmacy Antibiotic Note  Derek Woods is a 32 y.o. male admitted on 08/31/2022 with pneumonia concerning for empyema.  Pharmacy has been consulted for Vancomycin and Cefepime dosing.  CrCl >120 mL/min  Plan: Vancomycin 2000mg  IV once then 1250mg  Q12H (eAUC 434, Scr 0.96, Vd 0.5) - Recommend close level monitoring given BMI and risk of accumulation Cefepime 2g Q8H  Height: 5\' 6"  (167.6 cm) Weight: (!) 138.3 kg (305 lb) IBW/kg (Calculated) : 63.8  Temp (24hrs), Avg:98.8 F (37.1 C), Min:98.6 F (37 C), Max:99 F (37.2 C)  Recent Labs  Lab 08/31/22 2230 09/01/22 0127  WBC 6.8  --   CREATININE 0.96  --   LATICACIDVEN 1.8 1.1    Estimated Creatinine Clearance: 144.9 mL/min (by C-G formula based on SCr of 0.96 mg/dL).    Allergies  Allergen Reactions   Penicillins Rash    Childhood allergy Tolerated cefazolin and cefepime before    Thank you for allowing pharmacy to be a part of this patient's care.  Merrilee Jansky, PharmD Clinical Pharmacist 09/01/2022 9:10 AM

## 2022-09-01 NOTE — ED Provider Notes (Signed)
[facility] Emergency Department Provider Note MRN:  [id number]  Arrival date & time: 09/01/22     Chief Complaint   Shortness of Breath   History of Present Illness   Derek Woods is a 32 y.o. year-old male with a history of IV drug use presenting to the ED with chief complaint of shortness of breath.  Increased shortness of breath today.  Recently at [facility] and admitted for pneumonia.  Left AGAINST MEDICAL ADVICE.  Thinks he is having continued fevers.  Left-sided chest discomfort.  Review of Systems  A thorough review of systems was obtained and all systems are negative except as noted in the HPI and PMH.   Patient's Health History    Past Medical History:  Diagnosis Date   Anxiety    Asthma    Morbid obesity with BMI of 50.0-59.9, adult ([place])    Polysubstance dependence including opioid type drug with complication, episodic abuse ([place])    Tobacco dependence     Past Surgical History:  Procedure Laterality Date   THORACIC LAMINECTOMY FOR EPIDURAL ABSCESS N/A 10/15/2020   Procedure: THORACIC DECOMPRESSION LAMINECTOMY FOR EPIDURAL ABSCESS THORACIC ELEVEN- -THORACIC TWELVE;  Surgeon: Kary Kos, MD;  Location: [facility];  Service: Neurosurgery;  Laterality: N/A;   THORACOTOMY Right 10/09/2020   Procedure: RIGHT VATS/ THORACOTOMY/EMPYEMA;  Surgeon: Gaye Pollack, MD;  Location: [facility] OR;  Service: Thoracic;  Laterality: Right;    History reviewed. No pertinent family history.  Social History   Socioeconomic History   Marital status: Single    Spouse name: Not on file   Number of children: Not on file   Years of education: Not on file   Highest education level: Not on file  Occupational History   Occupation: unemployed  Tobacco Use   Smoking status: Every Day    Packs/day: 1.00    Years: 11.00    Additional pack years: 0.00    Total pack years: 11.00    Types: Cigarettes   Smokeless tobacco: Current   Tobacco comments:    unable to  smoke; occasional use of oral tobacco  Substance and Sexual Activity   Alcohol use: Yes    Comment: "hardly any"   Drug use: Yes    Types: Heroin, Marijuana    Comment: occasional marijuana, daily heroin   Sexual activity: Not on file  Other Topics Concern   Not on file  Social History Narrative   Not on file   Social Determinants of Health   Financial Resource Strain: Not on file  Food Insecurity: Not on file  Transportation Needs: Not on file  Physical Activity: Not on file  Stress: Not on file  Social Connections: Not on file  Intimate Partner Violence: Not on file     Physical Exam   Vitals:   08/31/22 2221 08/31/22 2351  BP:  (!) 165/91  Pulse:  (!) 102  Resp:  (!) 26  Temp:    SpO2: (!) 84% 96%    CONSTITUTIONAL: Chronically ill-appearing, NAD NEURO/PSYCH:  Alert and oriented x 3, no focal deficits EYES:  eyes equal and reactive ENT/NECK:  no LAD, no JVD CARDIO: Tachycardic rate, well-perfused, normal S1 and S2 PULM:  CTAB no wheezing or rhonchi GI/GU:  non-distended, non-tender MSK/SPINE:  No gross deformities, no edema SKIN:  no rash, track marks noted bilateral antecubital fossa   *Additional and/or pertinent findings included in MDM below  Diagnostic and Interventional Summary    EKG Interpretation  Date/Time:  Sunday August 31 2022 22:23:32 EDT Ventricular Rate:  113 PR Interval:  132 QRS Duration: 94 QT Interval:  328 QTC Calculation: 449 R Axis:   32 Text Interpretation: Sinus tachycardia Otherwise normal ECG No previous ECGs available Confirmed by Gerlene Fee [phone]) on 09/01/2022 12:17:09 AM       Labs Reviewed  COMPREHENSIVE METABOLIC PANEL - Abnormal; Notable for the following components:      Result Value   Sodium 134 (*)    Glucose, Bld 112 (*)    Calcium 8.5 (*)    Albumin 2.7 (*)    Alkaline Phosphatase 155 (*)    All other components within normal limits  CBC WITH DIFFERENTIAL/PLATELET - Abnormal; Notable for the following  components:   RBC 4.08 (*)    Hemoglobin 12.7 (*)    HCT 38.1 (*)    All other components within normal limits  APTT - Abnormal; Notable for the following components:   aPTT 21 (*)    All other components within normal limits  URINALYSIS, W/ REFLEX TO CULTURE (INFECTION SUSPECTED) - Abnormal; Notable for the following components:   Protein, ur 100 (*)    All other components within normal limits  CULTURE, BLOOD (ROUTINE X 2)  CULTURE, BLOOD (ROUTINE X 2)  RESP PANEL BY RT-PCR (RSV, FLU A&B, COVID)  [id number]  LACTIC ACID, PLASMA  PROTIME-INR  LACTIC ACID, PLASMA  TROPONIN I (HIGH SENSITIVITY)    DG Chest Port 1 View  Final Result      Medications  lactated ringers infusion (has no administration in time range)  lactated ringers bolus 1,000 mL (has no administration in time range)    And  lactated ringers bolus 1,000 mL (has no administration in time range)    And  lactated ringers bolus 1,000 mL (has no administration in time range)    And  lactated ringers bolus 1,000 mL (has no administration in time range)    And  lactated ringers bolus 500 mL (has no administration in time range)  ceFEPIme (MAXIPIME) 2 g in sodium chloride 0.9 % 100 mL IVPB (2 g Intravenous New Bag/Given 09/01/22 0016)  vancomycin (VANCOCIN) IVPB 1000 mg/200 mL premix (has no administration in time range)    And  vancomycin (VANCOCIN) IVPB 1000 mg/200 mL premix (has no administration in time range)     Procedures  /  Critical Care .Critical Care  Performed by: Maudie Flakes, MD Authorized by: Maudie Flakes, MD   Critical care provider statement:    Critical care time (minutes):  35   Critical care was necessary to treat or prevent imminent or life-threatening deterioration of the following conditions:  Sepsis   Critical care was time spent personally by me on the following activities:  Development of treatment plan with patient or surrogate, discussions with consultants, evaluation of patient's  response to treatment, examination of patient, ordering and review of laboratory studies, ordering and review of radiographic studies, ordering and performing treatments and interventions, pulse oximetry, re-evaluation of patient's condition and review of old charts   ED Course and Medical Decision Making  Initial Impression and Ddx Patient presenting with hypoxic respiratory failure requiring 4 L nasal cannula to maintain saturations.  Mildly tachycardic, mildly tachypneic.  Feels more comfortable on the oxygen.  Suspect has continued pulmonary sepsis.  History of IV drug use.  Underlying concern for endocarditis   Past medical/surgical history that increases complexity of ED encounter: IV drug use  Interpretation of Diagnostics I personally  reviewed the EKG and my interpretation is as follows: Sinus rhythm  No significant blood count or electrolyte disturbance.  Near complete opacification of the left lung on chest x-ray  Patient Reassessment and Ultimate Disposition/Management     Plan is for hospitalist admission.  Patient was a code sepsis on arrival, receiving vancomycin, ceftriaxone, IV fluids.  Patient management required discussion with the following services or consulting groups:  Hospitalist Service  Complexity of Problems Addressed Acute illness or injury that poses threat of life of bodily function  Additional Data Reviewed and Analyzed Further history obtained from: Care Everywhere and Prior labs/imaging results  Additional Factors Impacting ED Encounter Risk Consideration of hospitalization  Barth Kirks. Sedonia Small, [place] [email or web address]  Final Clinical Impressions(s) / ED Diagnoses     ICD-10-CM   1. Sepsis, due to unspecified organism, unspecified whether acute organ dysfunction present ([place])  A41.9     2. Acute respiratory failure with hypoxia ([facility])  J96.01       ED Discharge Orders     None         Discharge Instructions Discussed with and Provided to Patient:   Discharge Instructions   None      Maudie Flakes, MD 09/01/22 0021

## 2022-09-01 NOTE — ED Notes (Signed)
Patient reported increased shortness of breath. Patient sat into fowlers position with some improvement. Auscultation revealed slight air movements in upper lobe of left lung, absent in lower left lung. Right side clear.

## 2022-09-01 NOTE — ED Notes (Signed)
ED TO INPATIENT HANDOFF REPORT  ED Nurse Name and Phone #: Cat [phone]  S Name/Age/Gender Derek Woods 32 y.o. male Room/Bed: 042C/042C  Code Status   Code Status: Full Code  Home/SNF/Other Home Patient oriented to: self, place, time, and situation Is this baseline? Yes   Triage Complete: Triage complete  Chief Complaint Empyema of left pleural space [facility]) [J86.9]  Triage Note Pt arrived to triage complaining of shortness of breath, pt states that he was admitted for pneumonia and [place] but left AMA because he was feeling better.   On arrival to ED his O2 was 84% on RA.  Pt placed on 3L [place] with improvements 92%   Hx of asthma with increased inhaler use   [name] diaphoretic, breathing fast and pale.    Allergies Allergies  Allergen Reactions   Penicillins Rash    Childhood allergy Tolerated cefazolin and cefepime before    Level of Care/Admitting Diagnosis ED Disposition     ED Disposition  Admit   Condition  --   [place]: [place] [[id number]]  Level of Care: Telemetry Medical [104]  May admit patient to [name] or [facility] if equivalent level of care is available:: Yes  Covid Evaluation: Confirmed COVID Negative  Diagnosis: Empyema of left pleural space [id number]  Admitting Physician: Dory Horn [id number]  Attending Physician: Maudie Flakes [id number]  Certification:: I certify this patient will need inpatient services for at least 2 midnights  Estimated Length of Stay: 5          B Medical/Surgery History Past Medical History:  Diagnosis Date   Anxiety    Asthma    Morbid obesity with BMI of 50.0-59.9, adult ([place])    Polysubstance dependence including opioid type drug with complication, episodic abuse ([place])    Tobacco dependence    Past Surgical History:  Procedure Laterality Date   THORACIC LAMINECTOMY FOR EPIDURAL ABSCESS N/A 10/15/2020   Procedure: THORACIC DECOMPRESSION LAMINECTOMY  FOR EPIDURAL ABSCESS THORACIC ELEVEN- -THORACIC TWELVE;  Surgeon: Kary Kos, MD;  Location: [place];  Service: Neurosurgery;  Laterality: N/A;   THORACOTOMY Right 10/09/2020   Procedure: RIGHT VATS/ THORACOTOMY/EMPYEMA;  Surgeon: Gaye Pollack, MD;  Location: [facility] OR;  Service: Thoracic;  Laterality: Right;     A IV Location/Drains/Wounds Patient Lines/Drains/Airways Status     Active Line/Drains/Airways     Name Placement date Placement time Site Days   Peripheral IV 09/01/22 20 G 1.88" Anterior;Distal;Left;Upper Arm 09/01/22  0009  Arm  less than 1   Peripheral IV 09/01/22 20 G 1.88" Anterior;Distal;Right;Upper Arm 09/01/22  0025  Arm  less than 1            Intake/Output Last 24 hours  Intake/Output Summary (Last 24 hours) at 09/01/2022 1157 Last data filed at 09/01/2022 [id number] Gross per 24 hour  Intake 1084.81 ml  Output 400 ml  Net 684.81 ml    Labs/Imaging Results for orders placed or performed during the hospital encounter of 08/31/22 (from the past 48 hour(s))  Lactic acid, plasma     Status: None   Collection Time: 08/31/22 10:30 PM  Result Value Ref Range   Lactic Acid, Venous 1.8 0.5 - 1.9 mmol/L    Comment: Performed at [facility], [address] [place], [place] [address]  Comprehensive metabolic panel     Status: Abnormal   Collection Time: 08/31/22 10:30 PM  Result Value Ref Range   Sodium 134 (L)  135 - 145 mmol/L   Potassium 4.0 3.5 - 5.1 mmol/L   Chloride 101 98 - 111 mmol/L   CO2 23 22 - 32 mmol/L   Glucose, Bld 112 (H) 70 - 99 mg/dL    Comment: Glucose reference range applies only to samples taken after fasting for at least 8 hours.   BUN 13 6 - 20 mg/dL   Creatinine, Ser 0.96 0.61 - 1.24 mg/dL   Calcium 8.5 (L) 8.9 - 10.3 mg/dL   Total Protein 7.4 6.5 - 8.1 g/dL   Albumin 2.7 (L) 3.5 - 5.0 g/dL   AST 33 15 - 41 U/L   ALT 25 0 - 44 U/L   Alkaline Phosphatase 155 (H) 38 - 126 U/L   Total Bilirubin 0.3 0.3 - 1.2 mg/dL   GFR, Estimated >60  >60 mL/min    Comment: (NOTE) Calculated using the CKD-EPI Creatinine Equation (2021)    Anion gap 10 5 - 15    Comment: Performed at [facility], [place] [address], [place], [place] [address]  CBC with Differential     Status: Abnormal   Collection Time: 08/31/22 10:30 PM  Result Value Ref Range   WBC 6.8 4.0 - 10.5 K/uL   RBC 4.08 (L) 4.22 - 5.81 MIL/uL   Hemoglobin 12.7 (L) 13.0 - 17.0 g/dL   HCT 38.1 (L) 39.0 - 52.0 %   MCV 93.4 80.0 - 100.0 fL   MCH 31.1 26.0 - 34.0 pg   MCHC 33.3 30.0 - 36.0 g/dL   RDW 13.8 11.5 - 15.5 %   Platelets 320 150 - 400 K/uL   nRBC 0.0 0.0 - 0.2 %   Neutrophils Relative % 67 %   Neutro Abs 4.6 1.7 - 7.7 K/uL   Lymphocytes Relative 22 %   Lymphs Abs 1.5 0.7 - 4.0 K/uL   Monocytes Relative 3 %   Monocytes Absolute 0.2 0.1 - 1.0 K/uL   Eosinophils Relative 7 %   Eosinophils Absolute 0.4 0.0 - 0.5 K/uL   Basophils Relative 1 %   Basophils Absolute 0.1 0.0 - 0.1 K/uL   Immature Granulocytes 0 %   Abs Immature Granulocytes 0.02 0.00 - 0.07 K/uL    Comment: Performed at [facility] [address] [place], [place] [address]  Protime-INR     Status: None   Collection Time: 08/31/22 10:30 PM  Result Value Ref Range   Prothrombin Time 14.0 11.4 - 15.2 seconds   INR 1.1 0.8 - 1.2    Comment: (NOTE) INR goal varies based on device and disease states. Performed at [facility], [place] [address] [place], [place] [address]   APTT     Status: Abnormal   Collection Time: 08/31/22 10:30 PM  Result Value Ref Range   aPTT 21 (L) 24 - 36 seconds    Comment: Performed at [facility] [address] [place], [place] [address]  Urinalysis, w/ Reflex to Culture (Infection Suspected) -Urine, Clean Catch     Status: Abnormal   Collection Time: 08/31/22 10:34 PM  Result Value Ref Range   Specimen Source URINE, CLEAN CATCH    Color, Urine YELLOW YELLOW   APPearance CLEAR CLEAR   Specific Gravity, Urine 1.024 1.005 - 1.030   pH 5.0 5.0  - 8.0   Glucose, UA NEGATIVE NEGATIVE mg/dL   Hgb urine dipstick NEGATIVE NEGATIVE   Bilirubin Urine NEGATIVE NEGATIVE   Ketones, ur NEGATIVE NEGATIVE mg/dL  Protein, ur 100 (A) NEGATIVE mg/dL   Nitrite NEGATIVE NEGATIVE   Leukocytes,Ua NEGATIVE NEGATIVE   RBC / HPF 0-5 0 - 5 RBC/hpf   WBC, UA 0-5 0 - 5 WBC/hpf    Comment:        Reflex urine culture not performed if WBC <=10, OR if Squamous epithelial cells >5. If Squamous epithelial cells >5 suggest recollection.    Bacteria, UA NONE SEEN NONE SEEN   Squamous Epithelial / HPF 0-5 0 - 5 /HPF   Mucus PRESENT     Comment: Performed at [facility], [place] [address], [place], [place] [address]  Blood Culture (routine x 2)     Status: None (Preliminary result)   Collection Time: 08/31/22 10:40 PM   Specimen: BLOOD  Result Value Ref Range   Specimen Description BLOOD LEFT ARM    Special Requests      BOTTLES DRAWN AEROBIC AND ANAEROBIC Blood Culture adequate volume   Culture      NO GROWTH < 12 HOURS Performed at [facility], [place] [address], [place], [place] [address]    Report Status PENDING   Resp panel by RT-PCR (RSV, Flu A&B, Covid) Anterior Nasal Swab     Status: None   Collection Time: 08/31/22 11:50 PM   Specimen: Anterior Nasal Swab  Result Value Ref Range   SARS Coronavirus 2 by RT PCR NEGATIVE NEGATIVE   Influenza A by PCR NEGATIVE NEGATIVE   Influenza B by PCR NEGATIVE NEGATIVE    Comment: (NOTE) The Xpert Xpress SARS-CoV-2/FLU/RSV plus assay is intended as an aid in the diagnosis of influenza from Nasopharyngeal swab specimens and should not be used as a sole basis for treatment. Nasal washings and aspirates are unacceptable for Xpert Xpress SARS-CoV-2/FLU/RSV testing.  Fact Sheet for Patients: EntrepreneurPulse.com.au  Fact Sheet for Healthcare Providers: IncredibleEmployment.be  This test is not yet approved or cleared by the [place] FDA and has been  authorized for detection and/or diagnosis of SARS-CoV-2 by FDA under an Emergency Use Authorization (EUA). This EUA will remain in effect (meaning this test can be used) for the duration of the COVID-19 declaration under Section 564(b)(1) of the Act, 21 U.S.C. section 360bbb-3(b)(1), unless the authorization is terminated or revoked.     Resp Syncytial Virus by PCR NEGATIVE NEGATIVE    Comment: (NOTE) Fact Sheet for Patients: EntrepreneurPulse.com.au  Fact Sheet for Healthcare Providers: IncredibleEmployment.be  This test is not yet approved or cleared by the [place] FDA and has been authorized for detection and/or diagnosis of SARS-CoV-2 by FDA under an Emergency Use Authorization (EUA). This EUA will remain in effect (meaning this test can be used) for the duration of the COVID-19 declaration under Section 564(b)(1) of the Act, 21 U.S.C. section 360bbb-3(b)(1), unless the authorization is terminated or revoked.  Performed at [facility], [place] [address], [place], [place] [address]   Troponin I (High Sensitivity)     Status: None   Collection Time: 09/01/22 12:17 AM  Result Value Ref Range   Troponin I (High Sensitivity) 8 <18 ng/L    Comment: (NOTE) Elevated high sensitivity troponin I (hsTnI) values and significant  changes across serial measurements may suggest ACS but many other  chronic and acute conditions are known to elevate hsTnI results.  Refer to the "Links" section for chest pain algorithms and additional  guidance. Performed at [facility], [place] [address], [place], [place] [address]   Lactic acid, plasma  Status: None   Collection Time: 09/01/22  1:27 AM  Result Value Ref Range   Lactic Acid, Venous 1.1 0.5 - 1.9 mmol/L    Comment: Performed at [facility] [address], [place], [place] [address]  Troponin I (High Sensitivity)     Status: None   Collection Time: 09/01/22  2:37 AM  Result  Value Ref Range   Troponin I (High Sensitivity) 6 <18 ng/L    Comment: (NOTE) Elevated high sensitivity troponin I (hsTnI) values and significant  changes across serial measurements may suggest ACS but many other  chronic and acute conditions are known to elevate hsTnI results.  Refer to the "Links" section for chest pain algorithms and additional  guidance. Performed at [facility], [place] [address] [place], [place] [address]   Rapid urine drug screen (hospital performed)     Status: Abnormal   Collection Time: 09/01/22  9:40 AM  Result Value Ref Range   Opiates POSITIVE (A) NONE DETECTED   Cocaine NONE DETECTED NONE DETECTED   Benzodiazepines NONE DETECTED NONE DETECTED   Amphetamines NONE DETECTED NONE DETECTED   Tetrahydrocannabinol NONE DETECTED NONE DETECTED   Barbiturates NONE DETECTED NONE DETECTED    Comment: (NOTE) DRUG SCREEN FOR MEDICAL PURPOSES ONLY.  IF CONFIRMATION IS NEEDED FOR ANY PURPOSE, NOTIFY LAB WITHIN 5 DAYS.  LOWEST DETECTABLE LIMITS FOR URINE DRUG SCREEN Drug Class                     Cutoff (ng/mL) Amphetamine and metabolites    1000 Barbiturate and metabolites    200 Benzodiazepine                 200 Opiates and metabolites        300 Cocaine and metabolites        300 THC                            50 Performed at [facility], [place] [address] [place], [place] [address]    DG Chest Port 1 View  Result Date: 08/31/2022 CLINICAL DATA:  Possible sepsis EXAM: PORTABLE CHEST 1 VIEW COMPARISON:  10/14/2020, 08/29/2022 08/28/2022 FINDINGS: Near complete opacification of left thorax without significant change with minimal aerated lung at left apex. Right lung grossly clear. Partially obscured cardiomediastinal silhouette IMPRESSION: Near complete opacification of left thorax with small aerated lung at left apex without significant change. Electronically Signed   By: Donavan Foil M.D.   On: 08/31/2022 22:59    Pending Labs Unresulted Labs  (From admission, onward)     Start     Ordered   09/02/22 [id number]  Basic metabolic panel  Tomorrow morning,   R        09/01/22 0852   09/02/22 0500  CBC  Tomorrow morning,   R        09/01/22 0852   09/01/22 0852  HCV Ab w Reflex to Quant PCR  Once,   R        09/01/22 0852   09/01/22 0852  RPR  Once,   R        09/01/22 0852   09/01/22 0848  HIV Antibody (routine testing w rflx)  (HIV Antibody (Routine testing w reflex) panel)  Once,   R        09/01/22 0852   08/31/22 2234  Blood Culture (routine x 2)  (Undifferentiated presentation (  screening labs and basic nursing orders))  BLOOD CULTURE X 2,   STAT      08/31/22 2234            Vitals/Pain Today's Vitals   09/01/22 0638 09/01/22 0700 09/01/22 1024 09/01/22 1032  BP:  (!) 143/81  (!) 151/97  Pulse:  100  94  Resp:  (!) 28  (!) 29  Temp: 98.8 F (37.1 C)  98.7 F (37.1 C) 98.2 F (36.8 C)  TempSrc: Oral  Oral Oral  SpO2:  99%  98%  Weight:      Height:      PainSc:        Isolation Precautions No active isolations  Medications Medications  enoxaparin (LOVENOX) injection 40 mg (40 mg Subcutaneous Given 09/01/22 1041)  0.9 %  sodium chloride infusion ( Intravenous New Bag/Given 09/01/22 1038)  acetaminophen (TYLENOL) tablet 650 mg (has no administration in time range)    Or  acetaminophen (TYLENOL) suppository 650 mg (has no administration in time range)  polyethylene glycol (MIRALAX / GLYCOLAX) packet 17 g (has no administration in time range)  bisacodyl (DULCOLAX) EC tablet 5 mg (has no administration in time range)  ondansetron (ZOFRAN) tablet 4 mg (has no administration in time range)    Or  ondansetron (ZOFRAN) injection 4 mg (has no administration in time range)  albuterol (PROVENTIL) (2.5 MG/3ML) 0.083% nebulizer solution 2.5 mg (2.5 mg Nebulization Given 09/01/22 0918)  guaiFENesin (MUCINEX) 12 hr tablet 600 mg (has no administration in time range)  hydrALAZINE (APRESOLINE) injection 5 mg (has no  administration in time range)  dicyclomine (BENTYL) tablet 20 mg (has no administration in time range)  hydrOXYzine (ATARAX) tablet 25 mg (has no administration in time range)  loperamide (IMODIUM) capsule 2-4 mg (has no administration in time range)  methocarbamol (ROBAXIN) tablet 500 mg (has no administration in time range)  sodium chloride flush (NS) 0.9 % injection 3 mL (3 mLs Intravenous Given 09/01/22 1033)  ketorolac (TORADOL) 30 MG/ML injection 30 mg (has no administration in time range)  buprenorphine-naloxone (SUBOXONE) 2-0.5 mg per SL tablet 2 tablet (has no administration in time range)  buprenorphine-naloxone (SUBOXONE) 8-2 mg per SL tablet 1 tablet (has no administration in time range)  cloNIDine (CATAPRES) tablet 0.1 mg (0.1 mg Oral Given 09/01/22 1033)    Followed by  cloNIDine (CATAPRES) tablet 0.1 mg (has no administration in time range)    Followed by  cloNIDine (CATAPRES) tablet 0.1 mg (has no administration in time range)  ceFEPIme (MAXIPIME) 2 g in sodium chloride 0.9 % 100 mL IVPB (2 g Intravenous New Bag/Given 09/01/22 1040)  vancomycin (VANCOREADY) IVPB 1250 mg/250 mL (has no administration in time range)  lactated ringers bolus 1,000 mL (0 mLs Intravenous Stopped 09/01/22 0128)    And  lactated ringers bolus 1,000 mL (0 mLs Intravenous Stopped 09/01/22 0156)  ceFEPIme (MAXIPIME) 2 g in sodium chloride 0.9 % 100 mL IVPB (0 g Intravenous Stopped 09/01/22 0047)  vancomycin (VANCOCIN) IVPB 1000 mg/200 mL premix (0 mg Intravenous Stopped 09/01/22 0156)    And  vancomycin (VANCOCIN) IVPB 1000 mg/200 mL premix (0 mg Intravenous Stopped 09/01/22 0128)  ipratropium-albuterol (DUONEB) 0.5-2.5 (3) MG/3ML nebulizer solution 3 mL (3 mLs Nebulization Given 09/01/22 0122)    Mobility walks     Focused Assessments Pulmonary Assessment Handoff:  Lung sounds: Bilateral Breath Sounds: Diminished L Breath Sounds: Diminished R Breath Sounds: Diminished O2 Device: Nasal Cannula O2  Flow Rate (L/min): 4 L/min  R Recommendations: See Admitting Provider Note  Report given to:   Additional Notes:

## 2022-09-01 NOTE — Progress Notes (Signed)
   09/01/22 1100  Spiritual Encounters  Type of Visit Initial  Care provided to: Patient  Referral source Patient request  Reason for visit Urgent spiritual support  OnCall Visit No   Ch responded to request for spiritual and emotional support. No family at bedside. Pt declined visit bc he was too tired. No follow-up needed at this time.

## 2022-09-01 NOTE — Procedures (Signed)
Insertion of Chest Tube Procedure Note  Ezekiel Borenstein  VB:[id number]  11/01/1990  Date:09/01/22  Time:3:59 PM    Provider Performing: Lajuana Matte   Procedure: Chest Tube Insertion ([id number])  Indication(s) Effusion  Consent Risks of the procedure as well as the alternatives and risks of each were explained to the patient and/or caregiver.  Consent for the procedure was obtained and is signed in the bedside chart  Anesthesia Topical only with 1% lidocaine    Time Out Verified patient identification, verified procedure, site/side was marked, verified correct patient position, special equipment/implants available, medications/allergies/relevant history reviewed, required imaging and test results available.   Sterile Technique Maximal sterile technique including full sterile barrier drape, hand hygiene, sterile gown, sterile gloves, mask, hair covering, sterile ultrasound probe cover (if used).   Procedure Description Ultrasound not used to identify appropriate pleural anatomy for placement and overlying skin marked. Area of placement cleaned and draped in sterile fashion.  A 14 French pigtail pleural catheter was placed into the left pleural space using Seldinger technique. Appropriate return of fluid was obtained.  The tube was connected to atrium and placed on -20 cm H2O wall suction.   Complications/Tolerance None; patient tolerated the procedure well. Chest X-ray is ordered to verify placement.   EBL Minimal  Specimen(s) none

## 2022-09-01 NOTE — H&P (Signed)
History and Physical    Patient: Derek Woods [id number] DOB: 1990-12-21 DOA: 08/31/2022 DOS: the patient was seen and examined on 09/01/2022 PCP: Pcp, No  Patient coming from: Home - lives with roommates; NOK: Mother, Sandi Raveling, [phone]   Chief Complaint: SOB  HPI: Derek Woods is a 32 y.o. male with medical history significant of IVDA and morbid obesity presenting with SOB. He can't breathe.  He was at [facility] recently and they told him he has PNA with effusion, L lung is "basically completely useless right now."  He is going through opiate withdrawal and will need Suboxone at some point.  Last use of opiates within 12 hours (IV heroin).  He was here for 2 surgeries (prior empyema) in 2022, was on Suboxone, continued treatment after he was discharged but relapsed.  Little cough.  He is having back pain and starting to feel withdrawals.    ER Course:  Carryover, per Dr. Marlowe Sax:   32 year old IV drug user just admitted at [facility] with completely opacified left lung due to loculated effusion, probable empyema.  3/15 thoracentesis with only 125 mL out.  We were called for transfer while he was admitted there however per [facility] emails, CT surgeon on-call declined transfer.   Patient apparently left AMA because he was feeling better but now back in our ED.  Hypoxic on 3 L.  CXR again shows completely opacified left lung.  He was placed on broad-spectrum antibiotics and getting multiple fluid boluses.   Currently stable on 3 L O2.  EDP spoke to Dr. Emilia Beck with CVTS who requested that daytime hospitalist contact their team directly to request consult as their pulmonary guys are here in the morning on Monday.       Review of Systems: As mentioned in the history of present illness. All other systems reviewed and are negative. Past Medical History:  Diagnosis Date   Anxiety    Asthma    Morbid obesity with BMI of 50.0-59.9, adult ([place])    Polysubstance dependence including  opioid type drug with complication, episodic abuse ([place])    Tobacco dependence    Past Surgical History:  Procedure Laterality Date   THORACIC LAMINECTOMY FOR EPIDURAL ABSCESS N/A 10/15/2020   Procedure: THORACIC DECOMPRESSION LAMINECTOMY FOR EPIDURAL ABSCESS THORACIC ELEVEN- -THORACIC TWELVE;  Surgeon: Kary Kos, MD;  Location: [facility];  Service: Neurosurgery;  Laterality: N/A;   THORACOTOMY Right 10/09/2020   Procedure: RIGHT VATS/ THORACOTOMY/EMPYEMA;  Surgeon: Gaye Pollack, MD;  Location: [facility] OR;  Service: Thoracic;  Laterality: Right;   Social History:  reports that he has been smoking cigarettes. He has a 11.00 pack-year smoking history. He uses smokeless tobacco. He reports current alcohol use. He reports current drug use. Drugs: Heroin and Marijuana.  Allergies  Allergen Reactions   Penicillins Rash    Childhood allergy Tolerated cefazolin and cefepime before    History reviewed. No pertinent family history.  Prior to Admission medications   Medication Sig Start Date End Date Taking? Authorizing Provider  albuterol (VENTOLIN HFA) 108 (90 Base) MCG/ACT inhaler Inhale 2 puffs into the lungs every 6 (six) hours as needed for shortness of breath or wheezing. 03/04/18  Yes [provider]    Physical Exam: Vitals:   09/01/22 1130 09/01/22 1200 09/01/22 1230 09/01/22 1329  BP: 125/84 125/79 (!) 136/90 (!) 152/97  Pulse: 96 (!) 101 90 92  Resp: (!) 26 (!) 24 (!) 22   Temp:   98.2 F (36.8 C) 98.5 F (36.9  C)  TempSrc:   Oral Oral  SpO2: 98% 98% 100% 96%  Weight:      Height:       General:  Appears calm and comfortable and is in NAD; appears older than stated age and quite sedentary Eyes:   EOMI, normal lids, iris ENT:  grossly normal hearing, lips & tongue, mmm Neck:  no LAD, masses or thyromegaly Cardiovascular:  RR with mild tachycardia, no m/r/g. No LE edema.  Respiratory:   Diffusely diminished breath sounds along most of L lung field.  Normal to mildly  increased respiratory effort. Abdomen:  soft, NT, ND Skin:  no rash or induration seen on limited exam Musculoskeletal:  grossly normal tone BUE/BLE, good ROM, no bony abnormality Psychiatric:  blunted mood and affect, speech fluent and appropriate, AOx3 Neurologic:  CN 2-12 grossly intact, moves all extremities in coordinated fashion   Radiological Exams on Admission: Independently reviewed - see discussion in A/P where applicable  DG Chest Port 1 View  Result Date: 09/01/2022 CLINICAL DATA:  Status post chest tube placement EXAM: PORTABLE CHEST 1 VIEW COMPARISON:  08/31/2022 FINDINGS: Cardiac shadow is stable. New pigtail catheter is noted on the left with reduction in pleural effusion although a large lateral component remains. Underlying atelectasis is noted in the left lung. Right lung is clear. IMPRESSION: Reduction in left-sided pleural effusion following pigtail catheter placement. No pneumothorax is noted. Electronically Signed   By: Inez Catalina M.D.   On: 09/01/2022 16:21   DG Chest Port 1 View  Result Date: 08/31/2022 CLINICAL DATA:  Possible sepsis EXAM: PORTABLE CHEST 1 VIEW COMPARISON:  10/14/2020, 08/29/2022 08/28/2022 FINDINGS: Near complete opacification of left thorax without significant change with minimal aerated lung at left apex. Right lung grossly clear. Partially obscured cardiomediastinal silhouette IMPRESSION: Near complete opacification of left thorax with small aerated lung at left apex without significant change. Electronically Signed   By: Donavan Foil M.D.   On: 08/31/2022 22:59    EKG: Independently reviewed.  Sinus tachcyardia with rate 113; no evidence of acute ischemia   Labs on Admission: I have personally reviewed the available labs and imaging studies at the time of the admission.  Pertinent labs:    Glucose 112 AP 155 Albumin 2.7 Lactate 1.8, 1.1 HS troponin 8, 6 WBC 6.8 INR 1.1 COVID/flu/RSV negative UA: 100 protein   Assessment and  Plan: Principal Problem:   Empyema of left pleural space (HCC) Active Problems:   Polysubstance dependence including opioid type drug with complication, episodic abuse ([place])   Morbid obesity with BMI of 50.0-59.9, adult (HCC)   Tobacco dependence   Asthma, chronic    Empyema -Patient with prior pleural effusion requiring right-sided VATS on 10/09/20 (and thoracic decompression laminectomy for discitis on 10/15/20) -Now presenting with recurrent left-sided effusion -He was hospitalized for this at [facility] and was going to be transferred but CVTS deferred and then patient left AMA -He continued having SOB which is likely related to the effusion and so presented to the [facility] ER overnight -Will consult CVTS -Patient is likely to need another VATS  Heroin dependence -Long-standing heroin dependence -He reports a desire to quit and recognizes that ongoing use is life-threatening -He was previously on suboxone therapy and would like to restart this but is currently experiencing withdrawal symptoms and wants to wait; will order for tomorrow AM although it may need to be delayed further -Will add the clonidine detox order set for now -Will monitor on COWS protocol  -TOC  team consult for substance abuse education/support  Asthma -Likely exacerbated by smoking -No current issues -prn Albuterol  Tobacco dependence -Encourage cessation.   -This was discussed with the patient and should be reviewed on an ongoing basis.   -Patch ordered at patient request.   Morbid obesity -Body mass index is 49.23 kg/m..  -Weight loss should be encouraged -Outpatient PCP/bariatric medicine/bariatric surgery f/u encouraged    Advance Care Planning:   Code Status: Full Code - Code status was discussed with the patient and/or family at the time of admission.  The patient would want to receive full resuscitative measures at this time.   Consults: CVTS; RT; TOC team  DVT Prophylaxis: Lovenox  Family  Communication: None present; he declined to have me call his mother at the time of admission  Severity of Illness: The appropriate patient status for this patient is INPATIENT. Inpatient status is judged to be reasonable and necessary in order to provide the required intensity of service to ensure the patient's safety. The patient's presenting symptoms, physical exam findings, and initial radiographic and laboratory data in the context of their chronic comorbidities is felt to place them at high risk for further clinical deterioration. Furthermore, it is not anticipated that the patient will be medically stable for discharge from the hospital within 2 midnights of admission.   * I certify that at the point of admission it is my clinical judgment that the patient will require inpatient hospital care spanning beyond 2 midnights from the point of admission due to high intensity of service, high risk for further deterioration and high frequency of surveillance required.*  Author: Karmen Bongo, MD 09/01/2022 5:23 PM  For on call review [email or web address].

## 2022-09-01 NOTE — Plan of Care (Signed)
?  Problem: Respiratory: ?Goal: Ability to maintain adequate ventilation will improve ?Outcome: Progressing ?Goal: Ability to maintain a clear airway will improve ?Outcome: Progressing ?  ?

## 2022-09-01 NOTE — Consult Note (Addendum)
Reason for Consult: Concern for empyema Referring Physician: Hospitalist  Derek Woods is an 32 y.o. male.  HPI: He presented to [facility] with complaints of shortness of breath.  He was told he had a pleural effusion and pneumonia.  He has a previous history of empyema in April 2022 treated with right VATS/thoracotomy for drainage and decortication by Dr. Cyndia Bent.  He has a known history of IV drug abuse, specifically heroin and has been on Suboxone in the past.  He has relapsed and has recent IV heroin use.  He additionally has a history of a thoracic laminectomy for an epidural abscess in May 2022.  This was done by Dr. Saintclair Halsted  of neurosurgery.  At [facility] he had a thoracentesis on 3/15 with only 125 mL removed.  Chest x-ray revealed opacification of the left lung.  This has also been confirmed by CT scan.  He left AMA from [facility] but Derek Woods presented this morning to the ER here at [facility] and is being admitted by the hospitalist.  He has been placed on broad-spectrum antibiotics and received multiple fluid boluses.  We are being asked to consult for consideration of surgical intervention.  Current symptoms include some cough as well as some back pain.  He is also having some withdrawal symptoms related to opiate use.  Past Medical History:  Diagnosis Date   Anxiety    Asthma    Morbid obesity with BMI of 50.0-59.9, adult ([place])    Polysubstance dependence including opioid type drug with complication, episodic abuse ([place])    Tobacco dependence     Past Surgical History:  Procedure Laterality Date   THORACIC LAMINECTOMY FOR EPIDURAL ABSCESS N/A 10/15/2020   Procedure: THORACIC DECOMPRESSION LAMINECTOMY FOR EPIDURAL ABSCESS THORACIC ELEVEN- -THORACIC TWELVE;  Surgeon: Kary Kos, MD;  Location: [facility];  Service: Neurosurgery;  Laterality: N/A;   THORACOTOMY Right 10/09/2020   Procedure: RIGHT VATS/ THORACOTOMY/EMPYEMA;  Surgeon: Gaye Pollack, MD;  Location: [facility] OR;  Service:  Thoracic;  Laterality: Right;    History reviewed. No pertinent family history.  Social History:  reports that he has been smoking cigarettes. He has a 11.00 pack-year smoking history. He uses smokeless tobacco. He reports current alcohol use. He reports current drug use. Drugs: Heroin and Marijuana.  Allergies:  Allergies  Allergen Reactions   Penicillins Rash    Childhood allergy Tolerated cefazolin and cefepime before    Medications: Current Outpatient Medications  Medication Instructions   albuterol (VENTOLIN HFA) 108 (90 Base) MCG/ACT inhaler 2 puffs, Inhalation, Every 6 hours PRN     Results for orders placed or performed during the hospital encounter of 08/31/22 (from the past 48 hour(s))  Lactic acid, plasma     Status: None   Collection Time: 08/31/22 10:30 PM  Result Value Ref Range   Lactic Acid, Venous 1.8 0.5 - 1.9 mmol/L    Comment: Performed at [facility], [address], [place], [place] [address]  Comprehensive metabolic panel     Status: Abnormal   Collection Time: 08/31/22 10:30 PM  Result Value Ref Range   Sodium 134 (L) 135 - 145 mmol/L   Potassium 4.0 3.5 - 5.1 mmol/L   Chloride 101 98 - 111 mmol/L   CO2 23 22 - 32 mmol/L   Glucose, Bld 112 (H) 70 - 99 mg/dL    Comment: Glucose reference range applies only to samples taken after fasting for at least 8 hours.   BUN 13 6 -  20 mg/dL   Creatinine, Ser 0.96 0.61 - 1.24 mg/dL   Calcium 8.5 (L) 8.9 - 10.3 mg/dL   Total Protein 7.4 6.5 - 8.1 g/dL   Albumin 2.7 (L) 3.5 - 5.0 g/dL   AST 33 15 - 41 U/L   ALT 25 0 - 44 U/L   Alkaline Phosphatase 155 (H) 38 - 126 U/L   Total Bilirubin 0.3 0.3 - 1.2 mg/dL   GFR, Estimated >60 >60 mL/min    Comment: (NOTE) Calculated using the CKD-EPI Creatinine Equation (2021)    Anion gap 10 5 - 15    Comment: Performed at [facility], [place] [address], [place], [place] [address]  CBC with Differential     Status: Abnormal   Collection Time: 08/31/22 10:30 PM   Result Value Ref Range   WBC 6.8 4.0 - 10.5 K/uL   RBC 4.08 (L) 4.22 - 5.81 MIL/uL   Hemoglobin 12.7 (L) 13.0 - 17.0 g/dL   HCT 38.1 (L) 39.0 - 52.0 %   MCV 93.4 80.0 - 100.0 fL   MCH 31.1 26.0 - 34.0 pg   MCHC 33.3 30.0 - 36.0 g/dL   RDW 13.8 11.5 - 15.5 %   Platelets 320 150 - 400 K/uL   nRBC 0.0 0.0 - 0.2 %   Neutrophils Relative % 67 %   Neutro Abs 4.6 1.7 - 7.7 K/uL   Lymphocytes Relative 22 %   Lymphs Abs 1.5 0.7 - 4.0 K/uL   Monocytes Relative 3 %   Monocytes Absolute 0.2 0.1 - 1.0 K/uL   Eosinophils Relative 7 %   Eosinophils Absolute 0.4 0.0 - 0.5 K/uL   Basophils Relative 1 %   Basophils Absolute 0.1 0.0 - 0.1 K/uL   Immature Granulocytes 0 %   Abs Immature Granulocytes 0.02 0.00 - 0.07 K/uL    Comment: Performed at [facility] [address], [place], [place] [address]  Protime-INR     Status: None   Collection Time: 08/31/22 10:30 PM  Result Value Ref Range   Prothrombin Time 14.0 11.4 - 15.2 seconds   INR 1.1 0.8 - 1.2    Comment: (NOTE) INR goal varies based on device and disease states. Performed at [facility], [place] [address], [place], [place] [address]   APTT     Status: Abnormal   Collection Time: 08/31/22 10:30 PM  Result Value Ref Range   aPTT 21 (L) 24 - 36 seconds    Comment: Performed at [facility] [address], [place],  [address]  Urinalysis, w/ Reflex to Culture (Infection Suspected) -Urine, Clean Catch     Status: Abnormal   Collection Time: 08/31/22 10:34 PM  Result Value Ref Range   Specimen Source URINE, CLEAN CATCH    Color, Urine YELLOW YELLOW   APPearance CLEAR CLEAR   Specific Gravity, Urine 1.024 1.005 - 1.030   pH 5.0 5.0 - 8.0   Glucose, UA NEGATIVE NEGATIVE mg/dL   Hgb urine dipstick NEGATIVE NEGATIVE   Bilirubin Urine NEGATIVE NEGATIVE   Ketones, ur NEGATIVE NEGATIVE mg/dL   Protein, ur 100 (A) NEGATIVE mg/dL   Nitrite NEGATIVE NEGATIVE   Leukocytes,Ua NEGATIVE NEGATIVE   RBC / HPF 0-5 0 -  5 RBC/hpf   WBC, UA 0-5 0 - 5 WBC/hpf    Comment:        Reflex urine culture not performed if WBC <=10, OR if Squamous epithelial cells >5. If Squamous epithelial  cells >5 suggest recollection.    Bacteria, UA NONE SEEN NONE SEEN   Squamous Epithelial / HPF 0-5 0 - 5 /HPF   Mucus PRESENT     Comment: Performed at [facility], [place] [address] [place], [place] [address]  Blood Culture (routine x 2)     Status: None (Preliminary result)   Collection Time: 08/31/22 10:40 PM   Specimen: BLOOD  Result Value Ref Range   Specimen Description BLOOD LEFT ARM    Special Requests      BOTTLES DRAWN AEROBIC AND ANAEROBIC Blood Culture adequate volume   Culture      NO GROWTH < 12 HOURS Performed at [facility], [place] [address], [place], [place] [address]    Report Status PENDING   Resp panel by RT-PCR (RSV, Flu A&B, Covid) Anterior Nasal Swab     Status: None   Collection Time: 08/31/22 11:50 PM   Specimen: Anterior Nasal Swab  Result Value Ref Range   SARS Coronavirus 2 by RT PCR NEGATIVE NEGATIVE   Influenza A by PCR NEGATIVE NEGATIVE   Influenza B by PCR NEGATIVE NEGATIVE    Comment: (NOTE) The Xpert Xpress SARS-CoV-2/FLU/RSV plus assay is intended as an aid in the diagnosis of influenza from Nasopharyngeal swab specimens and should not be used as a sole basis for treatment. Nasal washings and aspirates are unacceptable for Xpert Xpress SARS-CoV-2/FLU/RSV testing.  Fact Sheet for Patients: EntrepreneurPulse.com.au  Fact Sheet for Healthcare Providers: IncredibleEmployment.be  This test is not yet approved or cleared by the [place] FDA and has been authorized for detection and/or diagnosis of SARS-CoV-2 by FDA under an Emergency Use Authorization (EUA). This EUA will remain in effect (meaning this test can be used) for the duration of the COVID-19 declaration under Section 564(b)(1) of the Act, 21 U.S.C. section  360bbb-3(b)(1), unless the authorization is terminated or revoked.     Resp Syncytial Virus by PCR NEGATIVE NEGATIVE    Comment: (NOTE) Fact Sheet for Patients: EntrepreneurPulse.com.au  Fact Sheet for Healthcare Providers: IncredibleEmployment.be  This test is not yet approved or cleared by the [place] FDA and has been authorized for detection and/or diagnosis of SARS-CoV-2 by FDA under an Emergency Use Authorization (EUA). This EUA will remain in effect (meaning this test can be used) for the duration of the COVID-19 declaration under Section 564(b)(1) of the Act, 21 U.S.C. section 360bbb-3(b)(1), unless the authorization is terminated or revoked.  Performed at [facility], [place] [address] [place], [place] [address]   Troponin I (High Sensitivity)     Status: None   Collection Time: 09/01/22 12:17 AM  Result Value Ref Range   Troponin I (High Sensitivity) 8 <18 ng/L    Comment: (NOTE) Elevated high sensitivity troponin I (hsTnI) values and significant  changes across serial measurements may suggest ACS but many other  chronic and acute conditions are known to elevate hsTnI results.  Refer to the "Links" section for chest pain algorithms and additional  guidance. Performed at [facility], [place] [address] [place], [place] [address]   Lactic acid, plasma     Status: None   Collection Time: 09/01/22  1:27 AM  Result Value Ref Range   Lactic Acid, Venous 1.1 0.5 - 1.9 mmol/L    Comment: Performed at [facility] [address] [place], [place] [address]  Troponin I (High Sensitivity)     Status: None   Collection Time: 09/01/22  2:37 AM  Result Value Ref Range   Troponin I (High Sensitivity) 6 <18 ng/L    Comment: (NOTE) Elevated high sensitivity troponin I (hsTnI) values and significant  changes across serial measurements may suggest ACS but many other  chronic and acute conditions are known to elevate hsTnI  results.  Refer to the "Links" section for chest pain algorithms and additional  guidance. Performed at [facility], [place] [address] [place], [place] [address]     DG Chest Port 1 View  Result Date: 08/31/2022 CLINICAL DATA:  Possible sepsis EXAM: PORTABLE CHEST 1 VIEW COMPARISON:  10/14/2020, 08/29/2022 08/28/2022 FINDINGS: Near complete opacification of left thorax without significant change with minimal aerated lung at left apex. Right lung grossly clear. Partially obscured cardiomediastinal silhouette IMPRESSION: Near complete opacification of left thorax with small aerated lung at left apex without significant change. Electronically Signed   By: Donavan Foil M.D.   On: 08/31/2022 22:59    Review of Systems  Constitutional:  Positive for chills and fatigue.       Morbid obesity  HENT:  Positive for postnasal drip.   Respiratory:  Positive for cough and shortness of breath.   Musculoskeletal:        Chronic left knee pain  Psychiatric/Behavioral:  Positive for agitation. The patient is nervous/anxious.    Blood pressure (!) 143/81, pulse 100, temperature 98.8 F (37.1 C), temperature source Oral, resp. rate (!) 28, height 5\' 6"  (1.676 m), weight (!) 138.3 kg, SpO2 99 %. Physical Exam Constitutional:      Appearance: He is obese. He is ill-appearing. He is not toxic-appearing or diaphoretic.  HENT:     Head: Normocephalic and atraumatic.     Mouth/Throat:     Mouth: Mucous membranes are moist.     Pharynx: Oropharynx is clear. No pharyngeal swelling or oropharyngeal exudate.  Eyes:     Extraocular Movements: Extraocular movements intact.     Pupils: Pupils are equal, round, and reactive to light.  Cardiovascular:     Rate and Rhythm: Normal rate and regular rhythm.     Comments: Soft systolic murmur  Pulmonary:     Effort: Pulmonary effort is normal.     Comments: Diminished throughout left fields Chest:     Chest wall: Tenderness present. No mass or deformity.   Abdominal:     General: Bowel sounds are normal.     Palpations: Abdomen is soft. There is no mass.     Comments: obese  Musculoskeletal:     Cervical back: Normal range of motion.     Right lower leg: No tenderness.     Left lower leg: No tenderness.     Comments: Non- pitting edema  Skin:    General: Skin is warm and dry.     Capillary Refill: Capillary refill takes less than 2 seconds.     Coloration: Skin is not cyanotic or pale.     Nails: There is no clubbing.     Comments: Some needle marks left forearm, + rash right neck where previous line was in place  Neurological:     General: No focal deficit present.     Mental Status: He is alert.  Psychiatric:        Mood and Affect: Mood is anxious.        Behavior: Behavior is agitated.     Assessment/Plan: Large left-sided pleural effusion with possible empyema component.  The patient and his studies will be reviewed by  the surgeon for consideration of operative intervention.  Will also order an echocardiogram for the patient and rule out valvular [place].  Active IV heroin abuse, currently experiencing withdrawal symptoms Anxiety History of asthma Morbid obesity Tobacco abuse   John Giovanni, PA-C  09/01/2022, 9:35 AM    Agree with above Large left effusion.  Increased work of breathing.  Appears to be in continuity on CT chest CT placed with return of 1L of SS fluid Awaiting CXR Possible OR tomorrow  Lajuana Matte

## 2022-09-02 ENCOUNTER — Inpatient Hospital Stay (HOSPITAL_COMMUNITY): Payer: Medicaid Other

## 2022-09-02 ENCOUNTER — Inpatient Hospital Stay (HOSPITAL_COMMUNITY): Payer: Medicaid Other | Admitting: Anesthesiology

## 2022-09-02 ENCOUNTER — Encounter (HOSPITAL_COMMUNITY): Payer: Self-pay | Admitting: Internal Medicine

## 2022-09-02 ENCOUNTER — Other Ambulatory Visit: Payer: Self-pay

## 2022-09-02 ENCOUNTER — Encounter (HOSPITAL_COMMUNITY)
Admission: EM | Disposition: A | Discharge status: Home / Self Care | Payer: Self-pay | Source: Home / Self Care | Attending: Internal Medicine

## 2022-09-02 DIAGNOSIS — I38 Endocarditis, valve unspecified: Secondary | ICD-10-CM

## 2022-09-02 DIAGNOSIS — J869 Pyothorax without fistula: Secondary | ICD-10-CM | POA: Diagnosis not present

## 2022-09-02 DIAGNOSIS — F17210 Nicotine dependence, cigarettes, uncomplicated: Secondary | ICD-10-CM

## 2022-09-02 DIAGNOSIS — J449 Chronic obstructive pulmonary disease, unspecified: Secondary | ICD-10-CM

## 2022-09-02 DIAGNOSIS — I10 Essential (primary) hypertension: Secondary | ICD-10-CM

## 2022-09-02 HISTORY — PX: VIDEO ASSISTED THORACOSCOPY (VATS)/DECORTICATION: SHX6171

## 2022-09-02 LAB — BASIC METABOLIC PANEL
Anion gap: 7 (ref 5–15)
BUN: 11 mg/dL (ref 6–20)
CO2: 27 mmol/L (ref 22–32)
Calcium: 8.4 mg/dL — ABNORMAL LOW (ref 8.9–10.3)
Chloride: 104 mmol/L (ref 98–111)
Creatinine, Ser: 0.85 mg/dL (ref 0.61–1.24)
GFR, Estimated: >60 mL/min (ref >60)
Glucose, Bld: 108 mg/dL — ABNORMAL HIGH (ref 70–99)
Potassium: 4.1 mmol/L (ref 3.5–5.1)
Sodium: 138 mmol/L (ref 135–145)

## 2022-09-02 LAB — CBC
HCT: 31.5 % — ABNORMAL LOW (ref 39.0–52.0)
Hemoglobin: 10.4 g/dL — ABNORMAL LOW (ref 13.0–17.0)
MCH: 30.7 pg (ref 26.0–34.0)
MCHC: 33 g/dL (ref 30.0–36.0)
MCV: 92.9 fL (ref 80.0–100.0)
Platelets: 243 10*3/uL (ref 150–400)
RBC: 3.39 MIL/uL — ABNORMAL LOW (ref 4.22–5.81)
RDW: 13.5 % (ref 11.5–15.5)
WBC: 5.3 10*3/uL (ref 4.0–10.5)
nRBC: 0 % (ref 0.0–0.2)

## 2022-09-02 LAB — ECHOCARDIOGRAM COMPLETE
Area-P 1/2: 5.31 cm2
Height: 66 in
S' Lateral: 3.5 cm
Weight: 4880 oz

## 2022-09-02 LAB — GLUCOSE, CAPILLARY: Glucose-Capillary: 157 mg/dL — ABNORMAL HIGH (ref 70–99)

## 2022-09-02 LAB — RPR: RPR Ser Ql: NONREACTIVE

## 2022-09-02 LAB — SURGICAL PCR SCREEN
MRSA, PCR: NEGATIVE
Staphylococcus aureus: POSITIVE — AB

## 2022-09-02 SURGERY — VIDEO ASSISTED THORACOSCOPY (VATS)/DECORTICATION
Anesthesia: General | Laterality: Left

## 2022-09-02 MED ORDER — DEXAMETHASONE SODIUM PHOSPHATE 10 MG/ML IJ SOLN
INTRAMUSCULAR | Status: DC | PRN
  Administered 2022-09-02: 10 mg via INTRAVENOUS

## 2022-09-02 MED ORDER — KETAMINE HCL 50 MG/5ML IJ SOSY
PREFILLED_SYRINGE | INTRAMUSCULAR | Status: AC
  Filled 2022-09-02: qty 5

## 2022-09-02 MED ORDER — BUPIVACAINE LIPOSOME 1.3 % IJ SUSP
INTRAMUSCULAR | Status: AC
  Filled 2022-09-02: qty 20

## 2022-09-02 MED ORDER — OXYCODONE HCL 5 MG PO TABS: 5.0000 mg | ORAL_TABLET | Freq: Once | ORAL | Status: DC | PRN

## 2022-09-02 MED ORDER — BUPIVACAINE HCL (PF) 0.5 % IJ SOLN
INTRAMUSCULAR | Status: AC
  Filled 2022-09-02: qty 30

## 2022-09-02 MED ORDER — HYDROMORPHONE HCL 1 MG/ML IJ SOLN
0.2500 mg | INTRAMUSCULAR | Status: DC | PRN
  Administered 2022-09-02 (×4): 0.5 mg via INTRAVENOUS

## 2022-09-02 MED ORDER — ONDANSETRON HCL 4 MG/2ML IJ SOLN
INTRAMUSCULAR | Status: DC | PRN
  Administered 2022-09-02: 4 mg via INTRAVENOUS

## 2022-09-02 MED ORDER — HYDROMORPHONE HCL 1 MG/ML IJ SOLN
INTRAMUSCULAR | Status: AC
  Filled 2022-09-02: qty 1

## 2022-09-02 MED ORDER — MUPIROCIN 2 % EX OINT
1.0000 | TOPICAL_OINTMENT | Freq: Two times a day (BID) | CUTANEOUS | Status: AC
  Administered 2022-09-02 – 2022-09-06 (×8): 1 via NASAL
  Filled 2022-09-02 (×2): qty 22

## 2022-09-02 MED ORDER — PROMETHAZINE HCL 25 MG/ML IJ SOLN: 6.2500 mg | INTRAMUSCULAR | Status: DC | PRN

## 2022-09-02 MED ORDER — LACTATED RINGERS IV SOLN: INTRAVENOUS | Status: DC

## 2022-09-02 MED ORDER — ONDANSETRON HCL 4 MG/2ML IJ SOLN
INTRAMUSCULAR | Status: AC
  Filled 2022-09-02: qty 2

## 2022-09-02 MED ORDER — MIDAZOLAM HCL 2 MG/2ML IJ SOLN: 0.5000 mg | Freq: Once | INTRAMUSCULAR | Status: DC | PRN

## 2022-09-02 MED ORDER — CHLORHEXIDINE GLUCONATE 0.12 % MT SOLN: 15.0000 mL | Freq: Once | OROMUCOSAL | Status: AC

## 2022-09-02 MED ORDER — MEPERIDINE HCL 25 MG/ML IJ SOLN: 6.2500 mg | INTRAMUSCULAR | Status: DC | PRN

## 2022-09-02 MED ORDER — DEXMEDETOMIDINE HCL IN NACL 80 MCG/20ML IV SOLN
INTRAVENOUS | Status: DC | PRN
  Administered 2022-09-02 (×2): 20 ug via BUCCAL

## 2022-09-02 MED ORDER — ORAL CARE MOUTH RINSE: 15.0000 mL | Freq: Once | OROMUCOSAL | Status: AC

## 2022-09-02 MED ORDER — SUFENTANIL CITRATE 50 MCG/ML IV SOLN
INTRAVENOUS | Status: DC | PRN
  Administered 2022-09-02: 5 ug via INTRAVENOUS
  Administered 2022-09-02: 10 ug via INTRAVENOUS
  Administered 2022-09-02: 5 ug via INTRAVENOUS
  Administered 2022-09-02: 10 ug via INTRAVENOUS

## 2022-09-02 MED ORDER — FENTANYL CITRATE (PF) 250 MCG/5ML IJ SOLN
INTRAMUSCULAR | Status: AC
  Filled 2022-09-02: qty 5

## 2022-09-02 MED ORDER — OXYCODONE HCL 5 MG/5ML PO SOLN: 5.0000 mg | Freq: Once | ORAL | Status: DC | PRN

## 2022-09-02 MED ORDER — SUFENTANIL CITRATE 50 MCG/ML IV SOLN
INTRAVENOUS | Status: AC
  Filled 2022-09-02: qty 1

## 2022-09-02 MED ORDER — CHLORHEXIDINE GLUCONATE CLOTH 2 % EX PADS
6.0000 | MEDICATED_PAD | Freq: Every day | CUTANEOUS | Status: AC
  Administered 2022-09-02 – 2022-09-04 (×3): 6 via TOPICAL

## 2022-09-02 MED ORDER — PROPOFOL 10 MG/ML IV BOLUS
INTRAVENOUS | Status: AC
  Filled 2022-09-02: qty 20

## 2022-09-02 MED ORDER — LIDOCAINE 2% (20 MG/ML) 5 ML SYRINGE
INTRAMUSCULAR | Status: DC | PRN
  Administered 2022-09-02: 60 mg via INTRAVENOUS

## 2022-09-02 MED ORDER — 0.9 % SODIUM CHLORIDE (POUR BTL) OPTIME
TOPICAL | Status: DC | PRN
  Administered 2022-09-02: 2000 mL

## 2022-09-02 MED ORDER — DEXAMETHASONE SODIUM PHOSPHATE 10 MG/ML IJ SOLN
INTRAMUSCULAR | Status: AC
  Filled 2022-09-02: qty 1

## 2022-09-02 MED ORDER — BUPIVACAINE LIPOSOME 1.3 % IJ SUSP
INTRAMUSCULAR | Status: DC | PRN
  Administered 2022-09-02: 50 mL

## 2022-09-02 MED ORDER — PHENYLEPHRINE HCL-NACL 20-0.9 MG/250ML-% IV SOLN
INTRAVENOUS | Status: DC | PRN
  Administered 2022-09-02: 30 ug/min via INTRAVENOUS

## 2022-09-02 MED ORDER — PROPOFOL 10 MG/ML IV BOLUS
INTRAVENOUS | Status: DC | PRN
  Administered 2022-09-02: 200 mg via INTRAVENOUS

## 2022-09-02 MED ORDER — MIDAZOLAM HCL 2 MG/2ML IJ SOLN
INTRAMUSCULAR | Status: AC
  Filled 2022-09-02: qty 2

## 2022-09-02 MED ORDER — CHLORHEXIDINE GLUCONATE 0.12 % MT SOLN
OROMUCOSAL | Status: AC
  Administered 2022-09-02: 15 mL via OROMUCOSAL
  Filled 2022-09-02: qty 15

## 2022-09-02 MED ORDER — ROCURONIUM BROMIDE 10 MG/ML (PF) SYRINGE
PREFILLED_SYRINGE | INTRAVENOUS | Status: DC | PRN
  Administered 2022-09-02: 30 mg via INTRAVENOUS
  Administered 2022-09-02: 70 mg via INTRAVENOUS

## 2022-09-02 MED ORDER — KETAMINE HCL 10 MG/ML IJ SOLN
INTRAMUSCULAR | Status: DC | PRN
  Administered 2022-09-02: 50 mg via INTRAVENOUS

## 2022-09-02 MED ORDER — SUGAMMADEX SODIUM 200 MG/2ML IV SOLN
INTRAVENOUS | Status: DC | PRN
  Administered 2022-09-02: 100 mg via INTRAVENOUS
  Administered 2022-09-02: 300 mg via INTRAVENOUS

## 2022-09-02 MED ORDER — MIDAZOLAM HCL 2 MG/2ML IJ SOLN
INTRAMUSCULAR | Status: DC | PRN
  Administered 2022-09-02: 2 mg via INTRAVENOUS

## 2022-09-02 SURGICAL SUPPLY — 77 items
BLADE CLIPPER SURG (BLADE) IMPLANT
BLADE SURG 11 STRL SS (BLADE) ×1 IMPLANT
CANISTER SUCT 3000ML PPV (MISCELLANEOUS) ×1 IMPLANT
CATH THORACIC 28FR (CATHETERS) IMPLANT
CATH THORACIC 36FR (CATHETERS) IMPLANT
CHLORAPREP W/TINT 26 (MISCELLANEOUS) ×1 IMPLANT
CNTNR URN SCR LID CUP LEK RST (MISCELLANEOUS) ×2 IMPLANT
CONT SPEC 4OZ STRL OR WHT (MISCELLANEOUS) ×1
DEFOGGER SCOPE WARMER CLEARIFY (MISCELLANEOUS) ×1 IMPLANT
DERMABOND ADVANCED .7 DNX12 (GAUZE/BANDAGES/DRESSINGS) ×1 IMPLANT
DISSECTOR BLUNT TIP ENDO 5MM (MISCELLANEOUS) IMPLANT
DRAIN CHANNEL 28F RND 3/8 FF (WOUND CARE) IMPLANT
DRAPE CV SPLIT W-CLR ANES SCRN (DRAPES) ×1 IMPLANT
DRAPE ORTHO SPLIT 77X108 STRL (DRAPES) ×1
DRAPE SURG ORHT 6 SPLT 77X108 (DRAPES) ×1 IMPLANT
ELECT BLADE 4.0 EZ CLEAN MEGAD (MISCELLANEOUS) ×1
ELECT REM PT RETURN 9FT ADLT (ELECTROSURGICAL) ×1
ELECTRODE BLDE 4.0 EZ CLN MEGD (MISCELLANEOUS) ×1 IMPLANT
ELECTRODE REM PT RTRN 9FT ADLT (ELECTROSURGICAL) ×1 IMPLANT
GAUZE SPONGE 4X4 12PLY STRL LF (GAUZE/BANDAGES/DRESSINGS) IMPLANT
GLOVE BIO SURGEON STRL SZ 6 (GLOVE) IMPLANT
GLOVE BIO SURGEON STRL SZ7.5 (GLOVE) ×2 IMPLANT
GOWN STRL REUS W/ TWL LRG LVL3 (GOWN DISPOSABLE) ×2 IMPLANT
GOWN STRL REUS W/ TWL XL LVL3 (GOWN DISPOSABLE) ×1 IMPLANT
GOWN STRL REUS W/TWL LRG LVL3 (GOWN DISPOSABLE) ×2
GOWN STRL REUS W/TWL XL LVL3 (GOWN DISPOSABLE) ×1
KIT BASIN OR (CUSTOM PROCEDURE TRAY) ×1 IMPLANT
KIT SUCTION CATH 14FR (SUCTIONS) ×1 IMPLANT
KIT TURNOVER KIT B (KITS) ×1 IMPLANT
NDL 18GX1X1/2 (RX/OR ONLY) (NEEDLE) ×1 IMPLANT
NDL HYPO 22X1.5 SAFETY MO (MISCELLANEOUS) IMPLANT
NEEDLE 18GX1X1/2 (RX/OR ONLY) (NEEDLE) ×1 IMPLANT
NEEDLE HYPO 22GX1.5 SAFETY (NEEDLE) ×1 IMPLANT
NEEDLE HYPO 22X1.5 SAFETY MO (MISCELLANEOUS) ×1 IMPLANT
NS IRRIG 1000ML POUR BTL (IV SOLUTION) ×2 IMPLANT
PACK CHEST (CUSTOM PROCEDURE TRAY) ×1 IMPLANT
PAD ARMBOARD 7.5X6 YLW CONV (MISCELLANEOUS) ×2 IMPLANT
PASSER SUT SWANSON 36MM LOOP (INSTRUMENTS) IMPLANT
SCISSORS LAP 5X35 DISP (ENDOMECHANICALS) IMPLANT
STOPCOCK 4 WAY LG BORE MALE ST (IV SETS) IMPLANT
SUT PROLENE 3 0 SH DA (SUTURE) IMPLANT
SUT PROLENE 4 0 RB 1 (SUTURE)
SUT PROLENE 4-0 RB1 .5 CRCL 36 (SUTURE) IMPLANT
SUT SILK  1 MH (SUTURE) ×1
SUT SILK 1 MH (SUTURE) ×1 IMPLANT
SUT SILK 1 TIES 10X30 (SUTURE) IMPLANT
SUT SILK 2 0SH CR/8 30 (SUTURE) IMPLANT
SUT SILK 3 0SH CR/8 30 (SUTURE) IMPLANT
SUT VIC AB 1 CTX 18 (SUTURE) IMPLANT
SUT VIC AB 1 CTX 36 (SUTURE)
SUT VIC AB 1 CTX36XBRD ANBCTR (SUTURE) IMPLANT
SUT VIC AB 2-0 CT1 27 (SUTURE) ×2
SUT VIC AB 2-0 CT1 TAPERPNT 27 (SUTURE) ×1 IMPLANT
SUT VIC AB 2-0 CTX 36 (SUTURE) IMPLANT
SUT VIC AB 2-0 UR6 27 (SUTURE) IMPLANT
SUT VIC AB 3-0 SH 27 (SUTURE) ×1
SUT VIC AB 3-0 SH 27X BRD (SUTURE) ×1 IMPLANT
SUT VIC AB 3-0 X1 27 (SUTURE) IMPLANT
SUT VICRYL 0 UR6 27IN ABS (SUTURE) ×1 IMPLANT
SUT VICRYL 2 TP 1 (SUTURE) IMPLANT
SWAB COLLECTION DEVICE MRSA (MISCELLANEOUS) IMPLANT
SWAB CULTURE ESWAB REG 1ML (MISCELLANEOUS) IMPLANT
SYR 10ML LL (SYRINGE) IMPLANT
SYR 20ML LL LF (SYRINGE) ×1 IMPLANT
SYR 50ML LL SCALE MARK (SYRINGE) IMPLANT
SYR CONTROL 10ML LL (SYRINGE) IMPLANT
SYSTEM SAHARA CHEST DRAIN ATS (WOUND CARE) ×1 IMPLANT
TAPE CLOTH 4X10 WHT NS (GAUZE/BANDAGES/DRESSINGS) ×1 IMPLANT
TAPE CLOTH SURG 4X10 WHT LF (GAUZE/BANDAGES/DRESSINGS) IMPLANT
TOWEL GREEN STERILE (TOWEL DISPOSABLE) ×2 IMPLANT
TRAP SPECIMEN MUCUS 40CC (MISCELLANEOUS) IMPLANT
TRAY FOLEY SLVR 16FR LF STAT (SET/KITS/TRAYS/PACK) ×1 IMPLANT
TROCAR 5M 150ML BLDLS (TROCAR) IMPLANT
TROCAR XCEL BLADELESS 5X75MML (TROCAR) IMPLANT
TROCAR Z THREAD OPTICAL 12X100 (TROCAR) IMPLANT
TUBING EXTENTION W/L.L. (IV SETS) IMPLANT
WATER STERILE IRR 1000ML POUR (IV SOLUTION) ×1 IMPLANT

## 2022-09-02 NOTE — Anesthesia Preprocedure Evaluation (Addendum)
Anesthesia Evaluation  Patient identified by MRN, date of birth, ID band Patient awake    Reviewed: Allergy & Precautions, NPO status , Patient's Chart, lab work & pertinent test results  History of Anesthesia Complications Negative for: history of anesthetic complications  Airway Mallampati: I  TM Distance: >3 FB Neck ROM: Full    Dental  (+) Dental Advisory Given   Pulmonary asthma , COPD,  COPD inhaler, Current Smoker and Patient abstained from smoking. empyema   breath sounds clear to auscultation + decreased breath sounds (L)      Cardiovascular hypertension (no meds), (-) angina  Rhythm:Regular Rate:Normal  09/02/2022 ECHO: EF 50-55%, LV has low normal function, no significant valvular abnormalities   Neuro/Psych   Anxiety     negative neurological ROS     GI/Hepatic ,GERD  Controlled,,(+)     substance abuse  , Hepatitis -, C  Endo/Other    Morbid obesityBMI 49.23  Renal/GU      Musculoskeletal  (+)  narcotic dependent  Abdominal  (+) + obese  Peds  Hematology  (+) Blood dyscrasia (Hb 10.4), anemia   Anesthesia Other Findings   Reproductive/Obstetrics                             Anesthesia Physical Anesthesia Plan  ASA: 3  Anesthesia Plan: General   Post-op Pain Management: Tylenol PO (pre-op)* and Toradol IV (intra-op)*   Induction: Intravenous  PONV Risk Score and Plan: 1  Airway Management Planned: Oral ETT, Double Lumen EBT and Video Laryngoscope Planned  Additional Equipment: Arterial line and None  Intra-op Plan:   Post-operative Plan: Extubation in OR  Informed Consent: I have reviewed the patients History and Physical, chart, labs and discussed the procedure including the risks, benefits and alternatives for the proposed anesthesia with the patient or authorized representative who has indicated his/her understanding and acceptance.     Dental advisory  given  Plan Discussed with: CRNA and Surgeon  Anesthesia Plan Comments:        Anesthesia Quick Evaluation

## 2022-09-02 NOTE — Progress Notes (Signed)
     [address]       [place] [address]             [phone]       No events Vitals:   09/02/22 1253 09/02/22 1300  BP: (!) 132/97   Pulse:  84  Resp:  (!) 31  Temp:    SpO2:  98%   Alert NAD Sinus SS CT output  OR today for L VATS, decortication  Leib Elahi O Patric Buckhalter

## 2022-09-02 NOTE — Progress Notes (Signed)
Pt arrived from ...PACU.., A/ox ..4.pt denies any pain, MD aware,CCMD called. CHG bath given,no further needs at this time    

## 2022-09-02 NOTE — Anesthesia Procedure Notes (Signed)
Arterial Line Insertion Start/End3/19/2024 2:52 PM, 09/02/2022 2:55 PM Performed by: Annye Asa, MD, anesthesiologist  Patient location: Pre-op. Preanesthetic checklist: patient identified, IV checked, risks and benefits discussed, surgical consent, monitors and equipment checked, pre-op evaluation, timeout performed and anesthesia consent Lidocaine 1% used for infiltration radial was placed Catheter size: 20 G Hand hygiene performed  and maximum sterile barriers used   Attempts: 1 Procedure performed using ultrasound guided technique. Ultrasound Notes:anatomy identified, needle tip was noted to be adjacent to the nerve/plexus identified, no ultrasound evidence of intravascular and/or intraneural injection and image(s) printed for medical record Following insertion, dressing applied and Biopatch. Post procedure assessment: normal and unchanged  Post procedure complications: second provider assisted. Patient tolerated the procedure well with no immediate complications.

## 2022-09-02 NOTE — Anesthesia Procedure Notes (Signed)
Procedure Name: Intubation Date/Time: 09/02/2022 2:45 PM  Performed by: Albin Felling, RNPre-anesthesia Checklist: Patient identified, Emergency Drugs available, Suction available and Patient being monitored Patient Re-evaluated:Patient Re-evaluated prior to induction Oxygen Delivery Method: Circle system utilized Preoxygenation: Pre-oxygenation with 100% oxygen Induction Type: IV induction Ventilation: Two handed mask ventilation required and Oral airway inserted - appropriate to patient size Laryngoscope Size: Glidescope and 4 Grade View: Grade I Tube type: Oral Endobronchial tube: Left, Double lumen EBT and EBT position confirmed by auscultation and 37 Fr Number of attempts: 2 Airway Equipment and Method: Stylet and Oral airway Placement Confirmation: ETT inserted through vocal cords under direct vision, positive ETCO2 and breath sounds checked- equal and bilateral Secured at: 29 cm Tube secured with: Tape Dental Injury: Teeth and Oropharynx as per pre-operative assessment  Comments: Glidescope 4 x1 with movement of vocal cords; removed glidescope and manually ventilated patient with oral airway, glidescope 4 x2 with grade 1 view on screen, 46F left DLT advanced through vocal cords.  Performed by Revonda Humphrey, SRNA under direct supervision by Dr. Glennon Mac and Heide Scales, CRNA

## 2022-09-02 NOTE — Progress Notes (Signed)
Patient belongings and cell phone at bedside and ring is in stepmothers possession to take home.

## 2022-09-02 NOTE — Progress Notes (Signed)
PROGRESS NOTE    Derek Woods  [id number]  DOB: 01/29/1991  DOA: 08/31/2022 PCP: Merryl Hacker, No Outpatient Specialists:   Hospital course: Briefly patient is a 32 year old male with history of ongoing IV heroin use, obesity and multiple episodes of infection including empyema requiring VATS surgery in April 2022 who resented to [facility] with shortness of breath and found to have recurrent left loculated effusion likely recurrent empyema.  Patient however left AMA from the hospital however presented back to the ED for shortness of breath and hypoxia.  Subjective:  Patient has been in the OR since this morning and I have not been able to see the patient.   Objective: Vitals:   09/02/22 0426 09/02/22 0800 09/02/22 1253 09/02/22 1300  BP: 131/69 138/86 (!) 132/97   Pulse: 84 90  84  Resp: 17 18  (!) 31  Temp: 98.5 F (36.9 C) 98.6 F (37 C)    TempSrc: Oral Oral    SpO2: 100% 99%  98%  Weight:      Height:        Intake/Output Summary (Last 24 hours) at 09/02/2022 1737 Last data filed at 09/02/2022 1715 Gross per 24 hour  Intake 2489.81 ml  Output 2150 ml  Net 339.81 ml   Filed Weights   08/31/22 2221  Weight: (!) 138.3 kg     Exam:  Patient has been in the OR all day so I have not been able to see the patient  Data Reviewed:  Basic Metabolic Panel: Recent Labs  Lab 08/31/22 2230 09/02/22 0431  NA 134* 138  K 4.0 4.1  CL 101 104  CO2 23 27  GLUCOSE 112* 108*  BUN 13 11  CREATININE 0.96 0.85  CALCIUM 8.5* 8.4*    CBC: Recent Labs  Lab 08/31/22 2230 09/02/22 0431  WBC 6.8 5.3  NEUTROABS 4.6  --   HGB 12.7* 10.4*  HCT 38.1* 31.5*  MCV 93.4 92.9  PLT 320 243     Scheduled Meds:  [MAR Hold] buprenorphine-naloxone  1 tablet Sublingual BID   [MAR Hold] Chlorhexidine Gluconate Cloth  6 each Topical Q0600   [MAR Hold] cloNIDine  0.1 mg Oral QID   Followed by   [id number] Hold] cloNIDine  0.1 mg Oral BH-qamhs   Followed by   [facility] Hold] cloNIDine  0.1  mg Oral QAC breakfast   [MAR Hold] enoxaparin (LOVENOX) injection  40 mg Subcutaneous Q24H   HYDROmorphone       HYDROmorphone       [MAR Hold] mupirocin ointment  1 Application Nasal BID   [MAR Hold] nicotine  21 mg Transdermal Daily   [MAR Hold] sodium chloride flush  3 mL Intravenous Q12H   Continuous Infusions:  sodium chloride 75 mL/hr at 09/01/22 1038   [MAR Hold] ceFEPime (MAXIPIME) IV 2 g (09/02/22 1004)   lactated ringers 10 mL/hr at 09/02/22 1333   [MAR Hold] vancomycin 1,250 mg (09/02/22 1040)     Assessment & Plan:   Empyema S/p right-sided VATS on 10/09/2020 Patient is presently in the OR for likely another VATS procedure Continue cefepime and vancomycin day #2 today.  Noncompliance with treatment Patient is signed out AMA multiple times  Heroin dependence with ongoing IV use Presently on clonidine detox and COWS protocol Suboxone ordered Note patient has previous history of discitis and is s/p laminectomy May 2022  Asthma As needed inhaled bronchodilators  Tobacco use Nicotine patch   DVT prophylaxis: Lovenox Code Status: Full Family Communication: None  today    Studies: ECHOCARDIOGRAM COMPLETE  Result Date: 09/02/2022    ECHOCARDIOGRAM REPORT   Patient Name:   Derek Woods Date of Exam: 09/02/2022 Medical Rec #:  [id number]    Height:       66.0 in Accession #:    [id number]   Weight:       305.0 lb Date of Birth:  1991-06-12    BSA:          2.393 m Patient Age:    23 years     BP:           125/85 mmHg Patient Gender: M            HR:           86 bpm. Exam Location:  Inpatient Procedure: 2D Echo, Cardiac Doppler and Color Doppler Indications:    Endocarditis I38  History:        Patient has prior history of Echocardiogram examinations, most                 recent 09/18/2020. Polysubstance abuse; Risk Factors:Current                 Smoker.  Sonographer:    Greer Pickerel Referring Phys: Wilder Glade GOLD  Sonographer Comments: Patient is obese, Technically  difficult study due to poor echo windows and Technically challenging study due to limited acoustic windows. Image acquisition challenging due to uncooperative patient and Image acquisition challenging due to respiratory motion. IMPRESSIONS  1. Left ventricular ejection fraction, by estimation, is 50 to 55%. The left ventricle has low normal function. Left ventricular endocardial border not optimally defined to evaluate regional wall motion. Left ventricular diastolic parameters were normal.  2. Right ventricular systolic function was not well visualized. The right ventricular size is not well visualized. Tricuspid regurgitation signal is inadequate for assessing PA pressure.  3. The mitral valve was not well visualized. No evidence of mitral valve regurgitation.  4. The aortic valve is tricuspid. Aortic valve regurgitation is not visualized. No aortic stenosis is present.  5. The inferior vena cava is normal in size with greater than 50% respiratory variability, suggesting right atrial pressure of 3 mmHg.  6. Technically dificult study with very poor images. The valves were barely visualized. This study cannot rule out endocarditis. FINDINGS  Left Ventricle: Left ventricular ejection fraction, by estimation, is 50 to 55%. The left ventricle has low normal function. Left ventricular endocardial border not optimally defined to evaluate regional wall motion. The left ventricular internal cavity  size was normal in size. There is no left ventricular hypertrophy. Left ventricular diastolic parameters were normal. Right Ventricle: The right ventricular size is not well visualized. Right vetricular wall thickness was not well visualized. Right ventricular systolic function was not well visualized. Tricuspid regurgitation signal is inadequate for assessing PA pressure. Left Atrium: Left atrial size was not well visualized. Right Atrium: Right atrial size was not well visualized. Pericardium: There is no evidence of  pericardial effusion. Mitral Valve: The mitral valve was not well visualized. No evidence of mitral valve regurgitation. Tricuspid Valve: The tricuspid valve is not well visualized. Tricuspid valve regurgitation is not demonstrated. Aortic Valve: The aortic valve is tricuspid. Aortic valve regurgitation is not visualized. No aortic stenosis is present. Pulmonic Valve: The pulmonic valve was normal in structure. Pulmonic valve regurgitation is not visualized. Aorta: The aortic root is normal in size and structure. Venous: The inferior vena cava is normal in size with  greater than 50% respiratory variability, suggesting right atrial pressure of 3 mmHg. IAS/Shunts: The interatrial septum was not well visualized.  LEFT VENTRICLE PLAX 2D LVIDd:         5.10 cm   Diastology LVIDs:         3.50 cm   LV e' medial:    13.30 cm/s LV PW:         1.00 cm   LV E/e' medial:  8.5 LV IVS:        0.70 cm   LV e' lateral:   12.50 cm/s LVOT diam:     1.80 cm   LV E/e' lateral: 9.0 LV SV:         52 LV SV Index:   22 LVOT Area:     2.54 cm  LEFT ATRIUM         Index LA diam:    3.80 cm 1.59 cm/m  AORTIC VALVE LVOT Vmax:   109.00 cm/s LVOT Vmean:  69.900 cm/s LVOT VTI:    0.204 m  AORTA Ao Root diam: 3.30 cm Ao Asc diam:  2.80 cm MITRAL VALVE MV Area (PHT): 5.31 cm     SHUNTS MV Decel Time: 143 msec     Systemic VTI:  0.20 m MV E velocity: 113.00 cm/s  Systemic Diam: 1.80 cm MV A velocity: 68.90 cm/s MV E/A ratio:  1.64 Dalton McleanMD Electronically signed by Franki Monte Signature Date/Time: 09/02/2022/1:13:23 PM    Final    DG Chest Port 1 View  Result Date: 09/01/2022 CLINICAL DATA:  Status post chest tube placement EXAM: PORTABLE CHEST 1 VIEW COMPARISON:  08/31/2022 FINDINGS: Cardiac shadow is stable. New pigtail catheter is noted on the left with reduction in pleural effusion although a large lateral component remains. Underlying atelectasis is noted in the left lung. Right lung is clear. IMPRESSION: Reduction in  left-sided pleural effusion following pigtail catheter placement. No pneumothorax is noted. Electronically Signed   By: Inez Catalina M.D.   On: 09/01/2022 16:21   DG Chest Port 1 View  Result Date: 08/31/2022 CLINICAL DATA:  Possible sepsis EXAM: PORTABLE CHEST 1 VIEW COMPARISON:  10/14/2020, 08/29/2022 08/28/2022 FINDINGS: Near complete opacification of left thorax without significant change with minimal aerated lung at left apex. Right lung grossly clear. Partially obscured cardiomediastinal silhouette IMPRESSION: Near complete opacification of left thorax with small aerated lung at left apex without significant change. Electronically Signed   By: Donavan Foil M.D.   On: 08/31/2022 22:59    Principal Problem:   Empyema of left pleural space (HCC) Active Problems:   Polysubstance dependence including opioid type drug with complication, episodic abuse ([place])   Morbid obesity with BMI of 50.0-59.9, adult ([place])   Tobacco dependence   Asthma, chronic     Jeffifer Rabold Tublu Nathen Balaban, [facility]  If 7PM-7AM, please contact night-coverage [email or web address]   LOS: 1 day

## 2022-09-02 NOTE — Progress Notes (Signed)
  Echocardiogram 2D Echocardiogram has been performed.  Derek Woods 09/02/2022, 10:51 AM

## 2022-09-02 NOTE — Op Note (Signed)
     [address]       [place] [address]             [phone]       09/02/2022 Patient:  Derek Woods Pre-Op Dx: Left empyema   Post-op Dx:  same Procedure: - left Video assisted thoracoscopy - Decortication - Intercostal nerve block  Surgeon and Role:      * Shalin Vonbargen, Lucile Crater, MD - Primary       Anesthesia  general EBL:  121ml Blood Administration: none Specimen: pleural fluid, and peal  Drains: 28 F argyle chest tube in left chest Counts: correct    Indications: 32yo male transferred for surgical management of a large left parapneumonic effusion.  A chest tube was placed with release of over 1L of fluid, but the patient still had loculation on CXR.  He was taken to the OR for decortication Findings: Fibrinopurulent empyema.    Operative Technique: After the risks, benefits and alternatives were thoroughly discussed, the patient was brought to the operative theatre.  Anesthesia was induced, the patient was then placed in a right lateral decubitus position and was prepped and draped in normal sterile fashion.  An appropriate surgical pause was performed, and pre-operative antibiotics were dosed accordingly.  We began with 2cm incision in the anterior axillary line at the 5th intercostal space.  The chest was entered, and we then placed a 1cm incision at the 10th intercostal space, and introduced our camera port.  The lung was directly visualized.  The lung was then mobilized off of the chest wall.  The pleural peal was carefully decorticated off of each lobe.  The fissure was then mobilized.  We achieved good expansion of the lung.  The chest was then irrigated.    An intercostal nerve block was performed under direct visualization.  A 28 F chest tube was then placed, and we watch the lung re-expand.  The skin and soft tissue were closed with absorbable suture    The patient tolerated the procedure without any immediate complications, and was  transferred to the PACU in stable condition.  Derek Woods

## 2022-09-02 NOTE — Progress Notes (Signed)
1300 - Short stay came for pt... upon further assessment, pt was found to have pulled out his tube from pleuravac connected to the pigtail.... connected plueravac tube to pigtail... Notified Short Stay... They contacted Dr. Kipp Brood and was told to bring pt down. Yellowish saturation noted on bedpads.   BP 132/97 (108) HR 84 RR 30  O2 98% on 3L [place]  Pt denies SOB, dyspnea... Transported pt to short stay along w/cardiac monitoring.

## 2022-09-02 NOTE — Transfer of Care (Signed)
Immediate Anesthesia Transfer of Care Note  Patient: Derek Woods  Procedure(s) Performed: VIDEO ASSISTED THORACOSCOPY (VATS)/DECORTICATION (Left)  Patient Location: PACU  Anesthesia Type:General  Level of Consciousness: awake, alert , and oriented  Airway & Oxygen Therapy: Patient Spontanous Breathing and Patient connected to face mask oxygen  Post-op Assessment: Report given to RN, Post -op Vital signs reviewed and stable, and Patient moving all extremities X 4  Post vital signs: Reviewed and stable  Last Vitals:  Vitals Value Taken Time  BP 130/90 09/02/22 1706  Temp    Pulse 87 09/02/22 1715  Resp 35 09/02/22 1715  SpO2 96 % 09/02/22 1715  Vitals shown include unvalidated device data.  Last Pain:  Vitals:   09/02/22 0911  TempSrc:   PainSc: 4       Patients Stated Pain Goal: 0 ([id number])  Complications: No notable events documented.

## 2022-09-03 ENCOUNTER — Encounter (HOSPITAL_COMMUNITY): Payer: Self-pay | Admitting: Thoracic Surgery (Cardiothoracic Vascular Surgery)

## 2022-09-03 ENCOUNTER — Inpatient Hospital Stay (HOSPITAL_COMMUNITY): Payer: Medicaid Other

## 2022-09-03 DIAGNOSIS — Z6841 Body Mass Index (BMI) 40.0 and over, adult: Secondary | ICD-10-CM

## 2022-09-03 DIAGNOSIS — F17200 Nicotine dependence, unspecified, uncomplicated: Secondary | ICD-10-CM

## 2022-09-03 DIAGNOSIS — F1920 Other psychoactive substance dependence, uncomplicated: Secondary | ICD-10-CM | POA: Diagnosis not present

## 2022-09-03 DIAGNOSIS — J45909 Unspecified asthma, uncomplicated: Secondary | ICD-10-CM | POA: Diagnosis not present

## 2022-09-03 DIAGNOSIS — J869 Pyothorax without fistula: Secondary | ICD-10-CM | POA: Diagnosis not present

## 2022-09-03 LAB — HCV RT-PCR, QUANT (NON-GRAPH): Hepatitis C Quantitation: NOT DETECTED IU/mL

## 2022-09-03 LAB — HCV AB W REFLEX TO QUANT PCR: HCV Ab: REACTIVE — AB

## 2022-09-03 LAB — BASIC METABOLIC PANEL
Anion gap: 6 (ref 5–15)
BUN: 11 mg/dL (ref 6–20)
CO2: 25 mmol/L (ref 22–32)
Calcium: 8.3 mg/dL — ABNORMAL LOW (ref 8.9–10.3)
Chloride: 105 mmol/L (ref 98–111)
Creatinine, Ser: 0.81 mg/dL (ref 0.61–1.24)
GFR, Estimated: >60 mL/min (ref >60)
Glucose, Bld: 146 mg/dL — ABNORMAL HIGH (ref 70–99)
Potassium: 4.5 mmol/L (ref 3.5–5.1)
Sodium: 136 mmol/L (ref 135–145)

## 2022-09-03 LAB — CBC
HCT: 31 % — ABNORMAL LOW (ref 39.0–52.0)
Hemoglobin: 10.6 g/dL — ABNORMAL LOW (ref 13.0–17.0)
MCH: 31.2 pg (ref 26.0–34.0)
MCHC: 34.2 g/dL (ref 30.0–36.0)
MCV: 91.2 fL (ref 80.0–100.0)
Platelets: 293 10*3/uL (ref 150–400)
RBC: 3.4 MIL/uL — ABNORMAL LOW (ref 4.22–5.81)
RDW: 13.2 % (ref 11.5–15.5)
WBC: 7 10*3/uL (ref 4.0–10.5)
nRBC: 0 % (ref 0.0–0.2)

## 2022-09-03 MED ORDER — MENTHOL 3 MG MT LOZG
1.0000 | LOZENGE | OROMUCOSAL | Status: DC | PRN
  Administered 2022-09-04: 3 mg via ORAL
  Filled 2022-09-03: qty 9

## 2022-09-03 MED ORDER — ALUM & MAG HYDROXIDE-SIMETH 200-200-20 MG/5ML PO SUSP
15.0000 mL | Freq: Once | ORAL | Status: AC
  Administered 2022-09-03: 15 mL via ORAL
  Filled 2022-09-03: qty 30

## 2022-09-03 MED ORDER — SODIUM CHLORIDE 0.9 % IV SOLN
INTRAVENOUS | Status: AC
  Administered 2022-09-03: 250 mL via INTRAVENOUS

## 2022-09-03 MED ORDER — ENOXAPARIN SODIUM 80 MG/0.8ML IJ SOSY: 70.0000 mg | PREFILLED_SYRINGE | INTRAMUSCULAR | Status: DC

## 2022-09-03 MED ORDER — ENOXAPARIN SODIUM 40 MG/0.4ML IJ SOSY
40.0000 mg | PREFILLED_SYRINGE | INTRAMUSCULAR | Status: DC
  Administered 2022-09-03: 40 mg via SUBCUTANEOUS
  Filled 2022-09-03 (×4): qty 0.4

## 2022-09-03 NOTE — Progress Notes (Addendum)
      [address]       [place] [address]             [phone]       1 Day Post-Op Procedure(s) (LRB): VIDEO ASSISTED THORACOSCOPY (VATS)/DECORTICATION (Left)  Subjective: Patient awakened when I entered his room. He asked me to hurry up as he wants to sleep  Objective: Vital signs in last 24 hours: Temp:  [97.3 F (36.3 C)-99 F (37.2 C)] 98.9 F (37.2 C) (03/20 0543) Pulse Rate:  [80-92] 83 (03/20 0543) Cardiac Rhythm: Normal sinus rhythm (03/19 2000) Resp:  [17-31] 23 (03/20 0543) BP: (120-153)/(68-97) 153/87 (03/20 0543) SpO2:  [94 %-100 %] 100 % (03/20 0543) Arterial Line BP: (96-132)/(58-93) 113/77 (03/19 1815)     Intake/Output from previous day: 03/19 0701 - 03/20 0700 In: 800 [I.V.:800] Out: 3260 [Urine:2950; Blood:50; Chest Tube:260]   Physical Exam:  Cardiovascular: RRR Pulmonary: Clear to auscultation bilaterally on right and slightly diminished left base Wounds: Dressing is clean and dry.  Chest Tube:to suction, no air leak  Lab Results: CBC: Recent Labs    09/02/22 0431 09/03/22 0252  WBC 5.3 7.0  HGB 10.4* 10.6*  HCT 31.5* 31.0*  PLT 243 293   BMET:  Recent Labs    09/02/22 0431 09/03/22 0252  NA 138 136  K 4.1 4.5  CL 104 105  CO2 27 25  GLUCOSE 108* 146*  BUN 11 11  CREATININE 0.85 0.81  CALCIUM 8.4* 8.3*    PT/INR:  Recent Labs    08/31/22 2230  LABPROT 14.0  INR 1.1   ABG:  INR: Will add last result for INR, ABG once components are confirmed Will add last 4 CBG results once components are confirmed  Assessment/Plan:  1. CV - SR with HR in the 70-80's. 2.  Pulmonary - On 3 liters of oxygen. Wean as tolerated. Chest tube with 260 cc of output since surgery. Chest tube to suction, no air leak. CXR this am appears stable. Likely place chest tube to water seal in am. Check CXR in am. Encourage incentive spirometer. 3. Expected post op blood loss anemia-H and H this am stable at 10.6 and 31. 4.  ID-on Cefepime and Vancomycin. 5. History of IVDU (Heroin)- Suboxone to be started soon;per medicine  Emmett Bracknell M ZimmermanPA-C 09/03/2022,7:38 AM

## 2022-09-03 NOTE — Anesthesia Postprocedure Evaluation (Signed)
Anesthesia Post Note  Patient: Derek Woods  Procedure(s) Performed: VIDEO ASSISTED THORACOSCOPY (VATS)/DECORTICATION (Left)     Patient location during evaluation: PACU Anesthesia Type: General Level of consciousness: awake and alert Pain management: pain level controlled Vital Signs Assessment: post-procedure vital signs reviewed and stable Respiratory status: spontaneous breathing, nonlabored ventilation, respiratory function stable and patient connected to nasal cannula oxygen Cardiovascular status: blood pressure returned to baseline and stable Postop Assessment: no apparent nausea or vomiting Anesthetic complications: no  No notable events documented.  Last Vitals:  Vitals:   09/03/22 0104 09/03/22 0543  BP: (!) 140/83 (!) 153/87  Pulse: 80 83  Resp: (!) 22 (!) 23  Temp: 37.2 C 37.2 C  SpO2: 95% 100%    Last Pain:  Vitals:   09/03/22 0543  TempSrc: Oral  PainSc:                  [name] S

## 2022-09-03 NOTE — Progress Notes (Signed)
PROGRESS NOTE    Derek Woods  [id number] DOB: [birth year] DOA: 08/31/2022 PCP: Pcp, No   Brief Narrative:  The patient is a 32 year old morbidly obese Caucasian male with past medical history significant for but limited to IV drug abuse as well as other comorbidities who presents with shortness of breath.  He was recently at [facility] and they told him he had pneumonia for the past 5 months due to the loculated effusions and probable empyema.  He underwent a thoracentesis on 08/29/2022.  He left [facility] he has medical advice given that he is feeling better and came back to [name], ED.  He was noted to be hypoxic 3 L chest x-ray again showed complete opacification of left lung and he was placed on broad-spectrum antibiotics and received multiple fluid boluses.  CT surgery was consulted and the patient for a VATS on 09/01/2020.  Currently he is postoperative day 1 and has a  chest tube to suction and will be placed on waterseal in the AM.  Assessment and Plan: No notes have been filed under this hospital service. Service: Hospitalist  Empyema with Acute Respiratory Failure with hypoxia -S/p right-sided VATS on 10/09/2020 as well and is now s/p VATS on 09/02/2022 -Continue cefepime and vancomycin day #3 today. -Continue supportive care and further care per cardiothoracic surgery -Getting IV fluid hydration with normal saline at 75 MLS per hour but will stop after 12 more hours -SpO2: 100 % O2 Flow Rate (L/min): 3 L/min -Continuous pulse oximetry maintain O2 saturation greater 90% Continue supplemental oxygen via nasal cannula wean O2 as tolerated -Continue supportive care and nebs as below -Continue guaifenesin 6 mg p.o. twice daily as needed cough and will add incentive spirometer as well as flutter valve -Continue with empiric antibiotics with IV cefepime and vancomycin -Since his procedure patient now has a chest tube and it is placed to suction with no airleak.  -Chest  x-ray done and showed "Left lateral pleural catheter in-situ with persistent asymmetrically decreased left lung aeration and small left pleural effusion. No pneumothorax." -Cardiothoracic surgery feels like they can place the chest tube to waterseal in the morning and repeat chest x-ray in a.m.   Noncompliance with treatment -Patient is signed out AMA multiple times   Heroin dependence with ongoing IV use -Presently on clonidine detox and COWS protocol and is taking 0.1 mg of clonidine every morning and at bedtime for 4 doses and 0.1 g p.o. 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 every 8 as needed for muscle spasms as well as p.o. acetaminophen 650 mg every 6 as needed for mild pain or fever   Asthma -C/w As needed inhaled bronchodilators albuterol 2.5 mg neb every 2 as needed for wheezing   Tobacco Use and Abuse -Smoking cessation counseling to be provided -C/w Nicotine patch 21 mg TD  Normocytic Anemia with mild acute blood loss anemia -Hgb/Hct Trend: Recent Labs  Lab 08/31/22 2230 09/02/22 0431 09/03/22 0252  HGB 12.7* 10.4* 10.6*  HCT 38.1* 31.5* 31.0*  MCV 93.4 92.9 91.2  -Check Anemia Panel in the AM -Continue to Monitor for S/Sx of Bleeding; No overt bleeding noted -Repeat CBC in the AM  Morbid Obesity -Complicates overall prognosis and care -Estimated body mass index is 49.23 kg/m as calculated from the following:   Height as of this encounter: 5\' 6"  (1.676 m).   Weight as of this encounter: 138.3 kg.  -Weight Loss and Dietary Counseling given  DVT prophylaxis: enoxaparin (LOVENOX) injection 40 mg Start: 09/03/22 1000    Code Status: Full  Code Family Communication: No family present at bedside   Disposition Plan:  Level of care: Progressive Status is: Inpatient Remains inpatient appropriate because: Needs further clinical improvement and clearance by TCTS   Consultants:  Cardiothoracic Surgery   Procedures:  ECHOCARDIOGRAM IMPRESSIONS     1. Left ventricular ejection fraction, by estimation, is 50 to 55%. The  left ventricle has low normal function. Left ventricular endocardial  border not optimally defined to evaluate regional wall motion. Left  ventricular diastolic parameters were  normal.   2. Right ventricular systolic function was not well visualized. The right  ventricular size is not well visualized. Tricuspid regurgitation signal is  inadequate for assessing PA pressure.   3. The mitral valve was not well visualized. No evidence of mitral valve  regurgitation.   4. The aortic valve is tricuspid. Aortic valve regurgitation is not  visualized. No aortic stenosis is present.   5. The inferior vena cava is normal in size with greater than 50%  respiratory variability, suggesting right atrial pressure of 3 mmHg.   6. Technically dificult study with very poor images. The valves were  barely visualized. This study cannot rule out endocarditis.   FINDINGS   Left Ventricle: Left ventricular ejection fraction, by estimation, is 50  to 55%. The left ventricle has low normal function. Left ventricular  endocardial border not optimally defined to evaluate regional wall motion.  The left ventricular internal cavity   size was normal in size. There is no left ventricular hypertrophy. Left  ventricular diastolic parameters were normal.   Right Ventricle: The right ventricular size is not well visualized. Right  vetricular wall thickness was not well visualized. Right ventricular  systolic function was not well visualized. Tricuspid regurgitation signal  is inadequate for assessing PA  pressure.   Left Atrium:  Left atrial size was not well visualized.   Right Atrium: Right atrial size was not well visualized.   Pericardium: There is no evidence of pericardial effusion.   Mitral Valve: The mitral valve was not well visualized. No evidence of  mitral valve regurgitation.   Tricuspid Valve: The tricuspid valve is not well visualized. Tricuspid  valve regurgitation is not demonstrated.   Aortic Valve: The aortic valve is tricuspid. Aortic valve regurgitation is  not visualized. No aortic stenosis is present.   Pulmonic Valve: The pulmonic valve was normal in structure. Pulmonic valve  regurgitation is not visualized.   Aorta: The aortic root is normal in size and structure.   Venous: The inferior vena cava is normal in size with greater than 50%  respiratory variability, suggesting right atrial pressure of 3 mmHg.   IAS/Shunts: The interatrial septum was not well visualized.     LEFT VENTRICLE  PLAX 2D  LVIDd:         5.10 cm   Diastology  LVIDs:         3.50 cm   LV e' medial:    13.30 cm/s  LV PW:         1.00 cm   LV E/e' medial:  8.5  LV IVS:  0.70 cm   LV e' lateral:   12.50 cm/s  LVOT diam:     1.80 cm   LV E/e' lateral: 9.0  LV SV:         52  LV SV Index:   22  LVOT Area:     2.54 cm     LEFT ATRIUM         Index  LA diam:    3.80 cm 1.59 cm/m   AORTIC VALVE  LVOT Vmax:   109.00 cm/s  LVOT Vmean:  69.900 cm/s  LVOT VTI:    0.204 m    AORTA  Ao Root diam: 3.30 cm  Ao Asc diam:  2.80 cm   MITRAL VALVE  MV Area (PHT): 5.31 cm     SHUNTS  MV Decel Time: 143 msec     Systemic VTI:  0.20 m  MV E velocity: 113.00 cm/s  Systemic Diam: 1.80 cm  MV A velocity: 68.90 cm/s  MV E/A ratio:  1.64   Antimicrobials:  Anti-infectives (From admission, onward)    Start     Dose/Rate Route Frequency Ordered Stop   09/01/22 1000  ceFEPIme (MAXIPIME) 2 g in sodium chloride 0.9 % 100 mL IVPB        2 g 200 mL/hr over 30 Minutes Intravenous Every 8 hours 09/01/22 0900      09/01/22 1000  vancomycin (VANCOCIN) IVPB 1000 mg/200 mL premix  Status:  Discontinued        1,000 mg 200 mL/hr over 60 Minutes Intravenous Every 12 hours 09/01/22 0906 09/01/22 0907   09/01/22 1000  vancomycin (VANCOREADY) IVPB 1250 mg/250 mL        1,250 mg 166.7 mL/hr over 90 Minutes Intravenous Every 12 hours 09/01/22 0907     09/01/22 0900  ceFEPIme (MAXIPIME) 2 g in sodium chloride 0.9 % 100 mL IVPB  Status:  Discontinued        2 g 200 mL/hr over 30 Minutes Intravenous  Once 09/01/22 0852 09/01/22 0900   08/31/22 2300  vancomycin (VANCOCIN) IVPB 1000 mg/200 mL premix  Status:  Discontinued        1,000 mg 200 mL/hr over 60 Minutes Intravenous  Once 08/31/22 2251 08/31/22 2257   08/31/22 2300  ceFEPIme (MAXIPIME) 2 g in sodium chloride 0.9 % 100 mL IVPB        2 g 200 mL/hr over 30 Minutes Intravenous  Once 08/31/22 2251 09/01/22 0047   08/31/22 2300  vancomycin (VANCOCIN) IVPB 1000 mg/200 mL premix       See Hyperspace for full Linked Orders Report.   1,000 mg 200 mL/hr over 60 Minutes Intravenous  Once 08/31/22 2257 09/01/22 0156   08/31/22 2300  vancomycin (VANCOCIN) IVPB 1000 mg/200 mL premix       See Hyperspace for full Linked Orders Report.   1,000 mg 200 mL/hr over 60 Minutes Intravenous  Once 08/31/22 2257 09/01/22 0128       Subjective: Seen and examined at bedside he was resting and had some slight discomfort.  Wanting to sleep.  No nausea or vomiting.  Denies any other concerns or complaints at this time.  Objective: Vitals:   09/02/22 1827 09/02/22 1930 09/03/22 0104 09/03/22 0543  BP: 132/86 (!) 142/96 (!) 140/83 (!) 153/87  Pulse: 85 89 80 83  Resp: 20 (!) 21 (!) 22 (!) 23  Temp: 98.5 F (36.9 C) 99 F (37.2 C) 99 F (37.2 C) 98.9 F (37.2 C)  TempSrc: Oral Oral Oral Oral  SpO2: 96% 96% 95% 100%  Weight:      Height:        Intake/Output Summary (Last 24 hours) at 09/03/2022 0908 Last data filed at 09/03/2022 0549 Gross per 24 hour  Intake 800 ml   Output 2560 ml  Net -1760 ml   Filed Weights   08/31/22 2221  Weight: (!) 138.3 kg   Examination: Physical Exam:  Constitutional: WN/WD obese Caucasian male in NAD appears calm Respiratory: Diminished to auscultation bilaterally with coarse breath sounds worse on the left compared to the Right, no wheezing, rales, rhonchi or crackles. Unlabored breathing. No accessory muscle use.  Cardiovascular: RRR, no murmurs / rubs / gallops. S1 and S2 auscultated. No extremity edema.  Abdomen: Soft, non-tender, Distended 2/2 to body habitus. Bowel sounds positive.  GU: Deferred. Musculoskeletal: No clubbing / cyanosis of digits/nails. No joint deformity upper and lower extremities.  Skin: No rashes, lesions, ulcers on a limited skin evaluation but does have a bruise on his left arm. No induration; Warm and dry.  Neurologic: CN 2-12 grossly intact with no focal deficits. Romberg sign and cerebellar reflexes not assessed.  Psychiatric: Normal judgment and insight. Alert and oriented x 3. Normal mood and appropriate affect.   Data Reviewed: I have personally reviewed following labs and imaging studies  CBC: Recent Labs  Lab 08/31/22 2230 09/02/22 0431 09/03/22 0252  WBC 6.8 5.3 7.0  NEUTROABS 4.6  --   --   HGB 12.7* 10.4* 10.6*  HCT 38.1* 31.5* 31.0*  MCV 93.4 92.9 91.2  PLT 320 243 0000000   Basic Metabolic Panel: Recent Labs  Lab 08/31/22 2230 09/02/22 0431 09/03/22 0252  NA 134* 138 136  K 4.0 4.1 4.5  CL 101 104 105  CO2 23 27 25   GLUCOSE 112* 108* 146*  BUN 13 11 11   CREATININE 0.96 0.85 0.81  CALCIUM 8.5* 8.4* 8.3*   GFR: Estimated Creatinine Clearance: 171.7 mL/min (by C-G formula based on SCr of 0.81 mg/dL). Liver Function Tests: Recent Labs  Lab 08/31/22 2230  AST 33  ALT 25  ALKPHOS 155*  BILITOT 0.3  PROT 7.4  ALBUMIN 2.7*   No results for input(s): "LIPASE", "AMYLASE" in the last 168 hours. No results for input(s): "AMMONIA" in the last 168  hours. Coagulation Profile: Recent Labs  Lab 08/31/22 2230  INR 1.1   Cardiac Enzymes: No results for input(s): "CKTOTAL", "CKMB", "CKMBINDEX", "TROPONINI" in the last 168 hours. BNP (last 3 results) No results for input(s): "PROBNP" in the last 8760 hours. HbA1C: No results for input(s): "HGBA1C" in the last 72 hours. CBG: Recent Labs  Lab 09/02/22 1848  GLUCAP 157*   Lipid Profile: No results for input(s): "CHOL", "HDL", "LDLCALC", "TRIG", "CHOLHDL", "LDLDIRECT" in the last 72 hours. Thyroid Function Tests: No results for input(s): "TSH", "T4TOTAL", "FREET4", "T3FREE", "THYROIDAB" in the last 72 hours. Anemia Panel: No results for input(s): "VITAMINB12", "FOLATE", "FERRITIN", "TIBC", "IRON", "RETICCTPCT" in the last 72 hours. Sepsis Labs: Recent Labs  Lab 08/31/22 2230 09/01/22 0127  LATICACIDVEN 1.8 1.1    Recent Results (from the past 240 hour(s))  Blood Culture (routine x 2)     Status: None (Preliminary result)   Collection Time: 08/31/22 10:40 PM   Specimen: BLOOD  Result Value Ref Range Status   Specimen Description BLOOD LEFT ARM  Final   Special Requests   Final    BOTTLES DRAWN AEROBIC AND ANAEROBIC Blood Culture adequate volume  Culture   Final    NO GROWTH 3 DAYS Performed at [facility], [place] [address], [place], [place] [address]    Report Status PENDING  Incomplete  Resp panel by RT-PCR (RSV, Flu A&B, Covid) Anterior Nasal Swab     Status: None   Collection Time: 08/31/22 11:50 PM   Specimen: Anterior Nasal Swab  Result Value Ref Range Status   SARS Coronavirus 2 by RT PCR NEGATIVE NEGATIVE Final   Influenza A by PCR NEGATIVE NEGATIVE Final   Influenza B by PCR NEGATIVE NEGATIVE Final    Comment: (NOTE) The Xpert Xpress SARS-CoV-2/FLU/RSV plus assay is intended as an aid in the diagnosis of influenza from Nasopharyngeal swab specimens and should not be used as a sole basis for treatment. Nasal washings and aspirates are unacceptable for  Xpert Xpress SARS-CoV-2/FLU/RSV testing.  Fact Sheet for Patients: EntrepreneurPulse.com.au  Fact Sheet for Healthcare Providers: IncredibleEmployment.be  This test is not yet approved or cleared by the [place] FDA and has been authorized for detection and/or diagnosis of SARS-CoV-2 by FDA under an Emergency Use Authorization (EUA). This EUA will remain in effect (meaning this test can be used) for the duration of the COVID-19 declaration under Section 564(b)(1) of the Act, 21 U.S.C. section 360bbb-3(b)(1), unless the authorization is terminated or revoked.     Resp Syncytial Virus by PCR NEGATIVE NEGATIVE Final    Comment: (NOTE) Fact Sheet for Patients: EntrepreneurPulse.com.au  Fact Sheet for Healthcare Providers: IncredibleEmployment.be  This test is not yet approved or cleared by the [place] FDA and has been authorized for detection and/or diagnosis of SARS-CoV-2 by FDA under an Emergency Use Authorization (EUA). This EUA will remain in effect (meaning this test can be used) for the duration of the COVID-19 declaration under Section 564(b)(1) of the Act, 21 U.S.C. section 360bbb-3(b)(1), unless the authorization is terminated or revoked.  Performed at [facility], [place] [address], [place], [place] [address]   Blood Culture (routine x 2)     Status: None (Preliminary result)   Collection Time: 09/01/22 12:23 AM   Specimen: BLOOD RIGHT FOREARM  Result Value Ref Range Status   Specimen Description BLOOD RIGHT FOREARM  Final   Special Requests   Final    BOTTLES DRAWN AEROBIC AND ANAEROBIC Blood Culture adequate volume   Culture   Final    NO GROWTH 2 DAYS Performed at [facility], [place] [address] [place], [place] [address]    Report Status PENDING  Incomplete  Aerobic/Anaerobic Culture w Gram Stain (surgical/deep wound)     Status: None (Preliminary result)   Collection  Time: 09/02/22  3:38 PM   Specimen: PATH Other; Body Fluid  Result Value Ref Range Status   Specimen Description FLUID PLEURAL  Final   Special Requests NONE  Final   Gram Stain   Final    NO WBC SEEN NO ORGANISMS SEEN Performed at [facility], [address], [place], [name] [address]    Culture PENDING  Incomplete   Report Status PENDING  Incomplete  Surgical pcr screen     Status: Abnormal   Collection Time: 09/02/22  4:03 PM   Specimen: Nasal Mucosa; Nasal Swab  Result Value Ref Range Status   MRSA, PCR NEGATIVE NEGATIVE Final   Staphylococcus aureus POSITIVE (A) NEGATIVE Final    Comment: (NOTE) The Xpert SA Assay (FDA approved for NASAL specimens in patients 16 years of age and older), is one  component of a comprehensive surveillance program. It is not intended to diagnose infection nor to guide or monitor treatment. Performed at [facility], [place] [address] [place], [place] [address]     Radiology Studies: DG CHEST PORT 1 VIEW  Result Date: 09/03/2022 CLINICAL DATA:  Empyema status post chest tube placement EXAM: PORTABLE CHEST 1 VIEW COMPARISON:  Chest radiograph dated 09/02/2022 FINDINGS: Lines/tubes: Left lateral pleural catheter in-situ. Chest: Persistent asymmetrically decreased left lung aeration. Similar linear right midlung opacity. Pleura: Similar small left pleural effusion.  No pneumothorax. Heart/mediastinum: Similar  cardiomediastinal silhouette. Bones: No acute osseous abnormality. IMPRESSION: Left lateral pleural catheter in-situ with persistent asymmetrically decreased left lung aeration and small left pleural effusion. No pneumothorax. Electronically Signed   By: Darrin Nipper M.D.   On: 09/03/2022 08:22   DG CHEST PORT 1 VIEW  Result Date: 09/02/2022 CLINICAL DATA:  Empyema EXAM: PORTABLE CHEST 1 VIEW COMPARISON:  09/01/2022 FINDINGS: Previously seen left pleural pigtail catheter has been replaced with left chest tube. Decreasing left effusion  with small residual effusion. No pneumothorax. Airspace disease throughout the left lung. Right upper lobe atelectasis. Heart and mediastinal contours within normal limits. No acute bony abnormality. IMPRESSION: Interval exchange of left pigtail catheter for left chest tube with decreasing left pleural effusion. Small left effusion remains. Diffuse left lung airspace disease. Electronically Signed   By: Rolm Baptise M.D.   On: 09/02/2022 19:08   ECHOCARDIOGRAM COMPLETE  Result Date: 09/02/2022    ECHOCARDIOGRAM REPORT   Patient Name:   Derek Woods Date of Exam: 09/02/2022 Medical Rec #:  [id number]    Height:       66.0 in Accession #:    [id number]   Weight:       305.0 lb Date of Birth:  Dec 06, 1990    BSA:          2.393 m Patient Age:    60 years     BP:           125/85 mmHg Patient Gender: M            HR:           86 bpm. Exam Location:  Inpatient Procedure: 2D Echo, Cardiac Doppler and Color Doppler Indications:    Endocarditis I38  History:        Patient has prior history of Echocardiogram examinations, most                 recent 09/18/2020. Polysubstance abuse; Risk Factors:Current                 Smoker.  Sonographer:    Greer Pickerel Referring Phys: Wilder Glade GOLD  Sonographer Comments: Patient is obese, Technically difficult study due to poor echo windows and Technically challenging study due to limited acoustic windows. Image acquisition challenging due to uncooperative patient and Image acquisition challenging due to respiratory motion. IMPRESSIONS  1. Left ventricular ejection fraction, by estimation, is 50 to 55%. The left ventricle has low normal function. Left ventricular endocardial border not optimally defined to evaluate regional wall motion. Left ventricular diastolic parameters were normal.  2. Right ventricular systolic function was not well visualized. The right ventricular size is not well visualized. Tricuspid regurgitation signal is inadequate for assessing PA pressure.  3. The mitral  valve was not well visualized. No evidence of mitral valve regurgitation.  4. The aortic valve is tricuspid. Aortic valve regurgitation is not visualized. No aortic stenosis is present.  5. The inferior vena cava is normal in size with greater than 50% respiratory variability, suggesting right atrial pressure of 3 mmHg.  6. Technically dificult study with very poor images. The valves were barely visualized. This study cannot rule out endocarditis. FINDINGS  Left Ventricle: Left ventricular ejection fraction, by estimation, is 50 to 55%. The left ventricle has low normal function. Left ventricular endocardial border not optimally defined to evaluate regional wall motion. The left ventricular internal cavity  size was normal in size. There is no left ventricular hypertrophy. Left ventricular diastolic parameters were normal. Right Ventricle: The right ventricular size is not well visualized. Right vetricular wall thickness was not well visualized. Right ventricular systolic function was not well visualized. Tricuspid regurgitation signal is inadequate for assessing PA pressure. Left Atrium: Left atrial size was not well visualized. Right Atrium: Right atrial size was not well visualized. Pericardium: There is no evidence of pericardial effusion. Mitral Valve: The mitral valve was not well visualized. No evidence of mitral valve regurgitation. Tricuspid Valve: The tricuspid valve is not well visualized. Tricuspid valve regurgitation is not demonstrated. Aortic Valve: The aortic valve is tricuspid. Aortic valve regurgitation is not visualized. No aortic stenosis is present. Pulmonic Valve: The pulmonic valve was normal in structure. Pulmonic valve regurgitation is not visualized. Aorta: The aortic root is normal in size and structure. Venous: The inferior vena cava is normal in size with greater than 50% respiratory variability, suggesting right atrial pressure of 3 mmHg. IAS/Shunts: The interatrial septum was not well  visualized.  LEFT VENTRICLE PLAX 2D LVIDd:         5.10 cm   Diastology LVIDs:         3.50 cm   LV e' medial:    13.30 cm/s LV PW:         1.00 cm   LV E/e' medial:  8.5 LV IVS:        0.70 cm   LV e' lateral:   12.50 cm/s LVOT diam:     1.80 cm   LV E/e' lateral: 9.0 LV SV:         52 LV SV Index:   22 LVOT Area:     2.54 cm  LEFT ATRIUM         Index LA diam:    3.80 cm 1.59 cm/m  AORTIC VALVE LVOT Vmax:   109.00 cm/s LVOT Vmean:  69.900 cm/s LVOT VTI:    0.204 m  AORTA Ao Root diam: 3.30 cm Ao Asc diam:  2.80 cm MITRAL VALVE MV Area (PHT): 5.31 cm     SHUNTS MV Decel Time: 143 msec     Systemic VTI:  0.20 m MV E velocity: 113.00 cm/s  Systemic Diam: 1.80 cm MV A velocity: 68.90 cm/s MV E/A ratio:  1.64 Dalton McleanMD Electronically signed by Franki Monte Signature Date/Time: 09/02/2022/1:13:23 PM    Final    DG Chest Port 1 View  Result Date: 09/01/2022 CLINICAL DATA:  Status post chest tube placement EXAM: PORTABLE CHEST 1 VIEW COMPARISON:  08/31/2022 FINDINGS: Cardiac shadow is stable. New pigtail catheter is noted on the left with reduction in pleural effusion although a large lateral component remains. Underlying atelectasis is noted in the left lung. Right lung is clear. IMPRESSION: Reduction in left-sided pleural effusion following pigtail catheter placement. No pneumothorax is noted. Electronically Signed   By: Inez Catalina M.D.   On: 09/01/2022 16:21    Scheduled Meds:  buprenorphine-naloxone  1 tablet Sublingual  BID   Chlorhexidine Gluconate Cloth  6 each Topical Q0600   cloNIDine  0.1 mg Oral QID   Followed by   cloNIDine  0.1 mg Oral BH-qamhs   Followed by   Derrill Memo ON 09/05/2022] cloNIDine  0.1 mg Oral QAC breakfast   enoxaparin (LOVENOX) injection  40 mg Subcutaneous Q24H   mupirocin ointment  1 Application Nasal BID   nicotine  21 mg Transdermal Daily   sodium chloride flush  3 mL Intravenous Q12H   Continuous Infusions:  sodium chloride 75 mL/hr at 09/01/22 1038   ceFEPime  (MAXIPIME) IV 2 g (09/03/22 0106)   vancomycin 1,250 mg (09/02/22 2110)    LOS: 2 days   Raiford Noble, DO [facility] Available via Epic secure chat 7am-7pm After these hours, please refer to coverage provider listed on [email or web address] 09/03/2022, 9:08 AM

## 2022-09-03 NOTE — Discharge Instructions (Signed)
Thoracotomy, Care After After a thoracotomy, it is common to have redness, pain, and swelling around the cut from surgery (incision). You may also have: Fluid or blood coming from the incision. Pain when you breathe in. Trouble pooping. Tiredness. Lack of interest in eating and drinking. Trouble sleeping. Follow these instructions at home: The instructions below may help you care for yourself at home. Your doctor may give you more instructions. If you have questions, ask your health care provider. Preventing lung infection  Take deep breaths or do breathing exercises as told by your doctor. Cough often. If it hurts to cough, hold a pillow against your chest or place both hands flat on top of your cut when you cough. Do lung therapy (pulmonary rehabilitation) as told by your doctor. Use an incentive spirometer as told by your doctor. This is a tool that shows how well you fill your lungs with each breath. Medicines Take over-the-counter and prescription medicines only as told by your doctor. If you have pain, take medicine before the pain gets very bad. This will make it easier to breathe and cough. If you were prescribed antibiotics, take them as told by your doctor. Do not stop taking them even if you start to feel better. If told, take steps to prevent problems with pooping (constipation). You may need to: Drink enough fluid to keep your pee (urine) pale yellow. Take medicines. You will be told what medicines to take. Eat foods that are high in fiber. These include beans, whole grains, and fresh fruits and vegetables. Limit foods that are high in fat and sugar. These include fried or sweet foods. Ask your doctor if you should avoid driving or using machines while you are taking your medicine. Activity  Rest as told by your doctor. Do not get on an airplane until your doctor says that it is safe. You may have to avoid lifting. Ask your doctor how much you can safely lift. Incision  care  Follow instructions from your doctor about how to take care of your incision. Make sure you: Wash your hands with soap and water for at least 20 seconds before and after you change your bandage. If you cannot use soap and water, use hand sanitizer. Change your bandage. Leave stitches or skin glue in place for at least 2 weeks. Leave tape strips alone unless you are told to take them off. You may trim the edges of the tape strips if they curl up. Keep your bandage dry. Check the area around your incision every day for signs of infection. Check for: More redness, swelling, or pain. More fluid or blood. Warmth. Pus or a bad smell. Wash your incision after your bandage has been taken off. Only use soap and water. Lifestyle Do not take baths, swim, or use a hot tub. Ask your doctor about taking showers or sponge baths. Do not smoke or use any products that contain nicotine or tobacco. If you need help quitting, ask your doctor. Stay away when other people are smoking. Eat a healthy diet as told by your doctor. A healthy diet has: Fresh fruits and vegetables. Whole grains. Low-fat (lean) proteins. General instructions Wear compression stockings as told by your doctor. If you have a chest tube, care for it as told by your doctor. Keep all follow-up visits. Your doctor will need to take out your chest tube. They may also need to check how well you are healing. Contact a doctor if: You have signs of an infection. Your  heartbeat seems off. You vomit or feel like vomiting. You have pain in your muscles, or you feel weak. You get a rash. Get help right away if: You have very bad pain or pain that gets worse even when you take medicine. You have trouble breathing. You develop signs of a blood clot. These may include: You have trouble talking. You are confused. You can't see well. You can't move. You lose feeling in your face, arms, or legs (feel numb). You faint. You have a very  bad headache all of a sudden. You have chest pain. These symptoms may be an emergency. Get help right away. Call 911. Do not wait to see if the symptoms will go away. Do not drive yourself to the hospital. This information is not intended to replace advice given to you by your health care provider. Make sure you discuss any questions you have with your health care provider. Document Revised: 11/27/2021 Document Reviewed: 11/27/2021 [employer] Patient Education  [employer].

## 2022-09-04 ENCOUNTER — Inpatient Hospital Stay (HOSPITAL_COMMUNITY): Payer: Medicaid Other

## 2022-09-04 DIAGNOSIS — F1920 Other psychoactive substance dependence, uncomplicated: Secondary | ICD-10-CM | POA: Diagnosis not present

## 2022-09-04 DIAGNOSIS — J869 Pyothorax without fistula: Secondary | ICD-10-CM | POA: Diagnosis not present

## 2022-09-04 DIAGNOSIS — J45909 Unspecified asthma, uncomplicated: Secondary | ICD-10-CM | POA: Diagnosis not present

## 2022-09-04 LAB — CBC WITH DIFFERENTIAL/PLATELET
Abs Immature Granulocytes: 0.02 10*3/uL (ref 0.00–0.07)
Basophils Absolute: 0 10*3/uL (ref 0.0–0.1)
Basophils Relative: 1 %
Eosinophils Absolute: 0.2 10*3/uL (ref 0.0–0.5)
Eosinophils Relative: 4 %
HCT: 29.4 % — ABNORMAL LOW (ref 39.0–52.0)
Hemoglobin: 9.7 g/dL — ABNORMAL LOW (ref 13.0–17.0)
Immature Granulocytes: 0 %
Lymphocytes Relative: 30 %
Lymphs Abs: 1.4 10*3/uL (ref 0.7–4.0)
MCH: 30.5 pg (ref 26.0–34.0)
MCHC: 33 g/dL (ref 30.0–36.0)
MCV: 92.5 fL (ref 80.0–100.0)
Monocytes Absolute: 0.2 10*3/uL (ref 0.1–1.0)
Monocytes Relative: 4 %
Neutro Abs: 2.8 10*3/uL (ref 1.7–7.7)
Neutrophils Relative %: 61 %
Platelets: 268 10*3/uL (ref 150–400)
RBC: 3.18 MIL/uL — ABNORMAL LOW (ref 4.22–5.81)
RDW: 13.4 % (ref 11.5–15.5)
WBC: 4.6 10*3/uL (ref 4.0–10.5)
nRBC: 0 % (ref 0.0–0.2)

## 2022-09-04 LAB — COMPREHENSIVE METABOLIC PANEL
ALT: 18 U/L (ref 0–44)
AST: 25 U/L (ref 15–41)
Albumin: 2 g/dL — ABNORMAL LOW (ref 3.5–5.0)
Alkaline Phosphatase: 93 U/L (ref 38–126)
Anion gap: 7 (ref 5–15)
BUN: 12 mg/dL (ref 6–20)
CO2: 23 mmol/L (ref 22–32)
Calcium: 8.2 mg/dL — ABNORMAL LOW (ref 8.9–10.3)
Chloride: 109 mmol/L (ref 98–111)
Creatinine, Ser: 0.91 mg/dL (ref 0.61–1.24)
GFR, Estimated: >60 mL/min (ref >60)
Glucose, Bld: 113 mg/dL — ABNORMAL HIGH (ref 70–99)
Potassium: 3.9 mmol/L (ref 3.5–5.1)
Sodium: 139 mmol/L (ref 135–145)
Total Bilirubin: 0.4 mg/dL (ref 0.3–1.2)
Total Protein: 5.6 g/dL — ABNORMAL LOW (ref 6.5–8.1)

## 2022-09-04 LAB — MAGNESIUM: Magnesium: 1.9 mg/dL (ref 1.7–2.4)

## 2022-09-04 LAB — SURGICAL PATHOLOGY

## 2022-09-04 LAB — PHOSPHORUS: Phosphorus: 2.4 mg/dL — ABNORMAL LOW (ref 2.5–4.6)

## 2022-09-04 MED ORDER — K PHOS MONO-SOD PHOS DI & MONO 155-852-130 MG PO TABS
500.0000 mg | ORAL_TABLET | Freq: Once | ORAL | Status: AC
  Administered 2022-09-04: 500 mg via ORAL
  Filled 2022-09-04: qty 2

## 2022-09-04 MED ORDER — CALCIUM CARBONATE ANTACID 500 MG PO CHEW
1.0000 | CHEWABLE_TABLET | Freq: Two times a day (BID) | ORAL | Status: DC | PRN
  Administered 2022-09-04 – 2022-09-06 (×2): 200 mg via ORAL
  Filled 2022-09-04 (×2): qty 1

## 2022-09-04 MED ORDER — SACCHAROMYCES BOULARDII 250 MG PO CAPS
250.0000 mg | ORAL_CAPSULE | Freq: Two times a day (BID) | ORAL | Status: DC
  Administered 2022-09-04 – 2022-09-08 (×8): 250 mg via ORAL
  Filled 2022-09-04 (×8): qty 1

## 2022-09-04 NOTE — Progress Notes (Addendum)
      [address]       [place] [address]             [phone]       2 Days Post-Op Procedure(s) (LRB): VIDEO ASSISTED THORACOSCOPY (VATS)/DECORTICATION (Left)  Subjective: Patient states "did you not read the sign" (he does not want to be awakened until after 10 am).  Objective: Vital signs in last 24 hours: Temp:  [98.5 F (36.9 C)-99.2 F (37.3 C)] 99 F (37.2 C) (03/21 0439) Pulse Rate:  [88-97] 88 (03/21 0439) Cardiac Rhythm: Normal sinus rhythm (03/20 1901) Resp:  [18-29] 23 (03/21 0439) BP: (135-153)/(82-96) 153/96 (03/21 0439) SpO2:  [96 %-99 %] 98 % (03/21 0439)     Intake/Output from previous day: 03/20 0701 - 03/21 0700 In: -  Out: 1400 [Urine:1250; Chest Tube:150]   Physical Exam:  Cardiovascular: RRR Pulmonary: Clear to auscultation bilaterally on right and slightly diminished left base Wounds: Dressing is clean and dry.  Chest Tube:to suction  Lab Results: CBC: Recent Labs    09/02/22 0431 09/03/22 0252  WBC 5.3 7.0  HGB 10.4* 10.6*  HCT 31.5* 31.0*  PLT 243 293    BMET:  Recent Labs    09/02/22 0431 09/03/22 0252  NA 138 136  K 4.1 4.5  CL 104 105  CO2 27 25  GLUCOSE 108* 146*  BUN 11 11  CREATININE 0.85 0.81  CALCIUM 8.4* 8.3*     PT/INR:  No results for input(s): "LABPROT", "INR" in the last 72 hours.  ABG:  INR: Will add last result for INR, ABG once components are confirmed Will add last 4 CBG results once components are confirmed  Assessment/Plan:  1. CV - SR with HR in the 70-80's. 2.  Pulmonary - On 3 liters of oxygen. Wean as tolerated. Chest tube with 150 cc of output since surgery. Chest tube to suction. CXR this am appears stable. Place chest tube to water seal. Check CXR in am. Encourage incentive spirometer. 3. Expected post op blood loss anemia-H and H this am stable at 10.6 and 31. 4. ID-on Cefepime and Vancomycin. Left pleural fluid shows no growth<24 hours. 5. History of IVDU  (Heroin)- Suboxone to be started soon;per medicine  Donielle M ZimmermanPA-C 09/04/2022,6:54 AM  Agree with above Chest tube to waterseal today  [place]

## 2022-09-04 NOTE — Progress Notes (Signed)
PROGRESS NOTE    Derek Woods  [id number] DOB: 04/22/1991 DOA: 08/31/2022 PCP: Pcp, No   Brief Narrative:  The patient is a 32 year old morbidly obese Caucasian male with past medical history significant for but limited to IV drug abuse as well as other comorbidities who presents with shortness of breath.  He was recently at [facility] and they told him he had pneumonia for the past 5 months due to the loculated effusions and probable empyema.  He underwent a thoracentesis on 08/29/2022.  He left [facility] he has medical advice given that he is feeling better and came back to [name], ED.  He was noted to be hypoxic 3 L chest x-ray again showed complete opacification of left lung and he was placed on broad-spectrum antibiotics and received multiple fluid boluses.  CT surgery was consulted and the patient for a VATS on 09/01/2020.  Currently he is postoperative day 2 and has continues to have the chest tube and this has not been placed to waterseal  Assessment and Plan:  Empyema with Acute Respiratory Failure with hypoxia Left Pleural Effusion -S/p right-sided VATS on 10/09/2020 as well and is now s/p VATS on 09/02/2022 -Continue cefepime and vancomycin day #4 today. -Continue supportive care and further care per Cardiothoracic surgery -IVF Hydration now to stop -SpO2: 95 % O2 Flow Rate (L/min): 3 L/min -Continuous pulse oximetry maintain O2 saturation greater 90% -Continue supplemental oxygen via nasal cannula wean O2 as tolerated -Continue supportive care and nebs as below -Continue guaifenesin 600 mg p.o. twice daily as needed cough and will add incentive spirometer as well as flutter valve -Continue with empiric antibiotics with IV cefepime and vancomycin -Chest x-ray done yesterday and showed "Left lateral pleural catheter in-situ with persistent asymmetrically decreased left lung aeration and small left pleural effusion. No pneumothorax." -CXR today showed "Stable small  loculated left pleural effusion and diffuse left lung opacity as noted on prior exam. Stable position of left-sided chest tube." -Cardiothoracic Surgery now placing the Chest tube to WaterSeal  -CXR to be repeated in the AM and TCTS encouraging Incentive Spirometry    Noncompliance with treatment -Patient is signed out AMA multiple times   Heroin dependence with ongoing IV use -Presently on clonidine detox and COWS protocol and is taking 0.1 mg of clonidine every morning and at bedtime for 4 doses and 0.1 g p.o. 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 every 8 as needed for muscle spasms as well as p.o. acetaminophen 650 mg every 6 as needed for mild pain or fever   Asthma -C/w As needed inhaled bronchodilators albuterol 2.5 mg neb every 2 as needed for wheezing   Tobacco Use and Abuse -Smoking cessation counseling to be provided -C/w Nicotine patch 21 mg TD  Heartburn -Provided with PRN TUMS overnight -May need PPI   Normocytic Anemia with mild acute blood loss anemia -Hgb/Hct Trend: Recent Labs  Lab 08/31/22 2230 09/02/22 0431 09/03/22 0252 09/04/22 1103  HGB 12.7* 10.4* 10.6* 9.7*  HCT 38.1* 31.5* 31.0* 29.4*  MCV 93.4 92.9 91.2 92.5  -Check Anemia Panel in the AM; As expected blood loss -Continue to Monitor for S/Sx of Bleeding; No overt bleeding noted -Repeat  CBC in the AM   Hypophosphatemia -Mild. Phos Level was 2.4 -Replete with po K Phos 500 mg x1 -Continue to Monitor and Replete as Necessary -Repeat Phos Level in the AM   Hypoalbuminemia -Patient's Albumin Trend: Recent Labs  Lab 08/31/22 2230 09/04/22 1103  ALBUMIN 2.7* 2.0*  -Continue to  Monitor and Trend and repeat CMP in the AM   Morbid Obesity -Complicates overall prognosis and care -Estimated body mass index is 49.23 kg/m as calculated from the following:   Height as of this encounter: 5\' 6"  (1.676 m).   Weight as of this encounter: 138.3 kg.  -Weight Loss and Dietary Counseling given  DVT prophylaxis: enoxaparin (LOVENOX) injection 40 mg Start: 09/03/22 1000    Code Status: Full Code Family Communication: No family present at bedside  Disposition Plan:  Level of care: Progressive Status is: Inpatient Remains inpatient appropriate because: Needs further clinical improvement and clearance   Consultants:  Cardiothoracic Surgery   Procedures:  ECHOCARDIOGRAM IMPRESSIONS     1. Left ventricular ejection fraction, by estimation, is 50 to 55%. The  left ventricle has low normal function. Left ventricular endocardial  border not optimally defined to evaluate regional wall motion. Left  ventricular diastolic parameters were  normal.   2. Right ventricular systolic function was not well visualized. The right  ventricular size is not well visualized. Tricuspid regurgitation signal is  inadequate for assessing PA pressure.   3. The mitral valve was not well visualized. No evidence of mitral valve  regurgitation.   4. The aortic valve is tricuspid. Aortic valve regurgitation is not  visualized. No aortic stenosis is present.   5. The inferior vena cava is normal in size with greater than 50%  respiratory variability, suggesting right atrial pressure of 3 mmHg.   6. Technically dificult study with very poor images. The valves were  barely visualized. This study cannot rule out endocarditis.   FINDINGS   Left Ventricle: Left ventricular ejection fraction, by estimation, is 50  to 55%. The left ventricle has low normal function. Left ventricular  endocardial border not optimally defined to evaluate regional wall motion.  The left ventricular internal cavity    size was normal in size. There is no left ventricular hypertrophy. Left  ventricular diastolic parameters were normal.   Right Ventricle: The right ventricular size is not well visualized. Right  vetricular wall thickness was not well visualized. Right ventricular  systolic function was not well visualized. Tricuspid regurgitation signal  is inadequate for assessing PA  pressure.   Left Atrium: Left atrial size was not well visualized.   Right Atrium: Right atrial size was not well visualized.   Pericardium: There is no evidence of pericardial effusion.   Mitral Valve: The mitral valve was not well visualized. No evidence of  mitral valve regurgitation.   Tricuspid Valve: The tricuspid valve is not well visualized. Tricuspid  valve regurgitation is not demonstrated.   Aortic Valve: The aortic valve is tricuspid. Aortic valve regurgitation is  not visualized. No aortic stenosis is present.   Pulmonic Valve: The pulmonic valve was normal in structure. Pulmonic valve  regurgitation is not visualized.   Aorta: The aortic root is normal in size and structure.   Venous: The inferior vena cava is normal in size with greater than 50%  respiratory variability, suggesting right atrial pressure of 3 mmHg.   IAS/Shunts: The interatrial septum was not well visualized.     LEFT VENTRICLE  PLAX 2D  LVIDd:  5.10 cm   Diastology  LVIDs:         3.50 cm   LV e' medial:    13.30 cm/s  LV PW:         1.00 cm   LV E/e' medial:  8.5  LV IVS:        0.70 cm   LV e' lateral:   12.50 cm/s  LVOT diam:     1.80 cm   LV E/e' lateral: 9.0  LV SV:         52  LV SV Index:   22  LVOT Area:     2.54 cm     LEFT ATRIUM         Index  LA diam:    3.80 cm 1.59 cm/m   AORTIC VALVE  LVOT Vmax:   109.00 cm/s  LVOT Vmean:  69.900 cm/s  LVOT VTI:    0.204 m    AORTA  Ao Root diam: 3.30 cm  Ao Asc diam:  2.80 cm   MITRAL VALVE  MV Area (PHT): 5.31 cm     SHUNTS  MV Decel Time: 143 msec      Systemic VTI:  0.20 m  MV E velocity: 113.00 cm/s  Systemic Diam: 1.80 cm  MV A velocity: 68.90 cm/s  MV E/A ratio:  1.64   Procedure by Dr. Melodie Bouillon on 09/02/22: - left Video assisted thoracoscopy - Decortication - Intercostal nerve block  Antimicrobials:  Anti-infectives (From admission, onward)    Start     Dose/Rate Route Frequency Ordered Stop   09/01/22 1000  ceFEPIme (MAXIPIME) 2 g in sodium chloride 0.9 % 100 mL IVPB        2 g 200 mL/hr over 30 Minutes Intravenous Every 8 hours 09/01/22 0900     09/01/22 1000  vancomycin (VANCOCIN) IVPB 1000 mg/200 mL premix  Status:  Discontinued        1,000 mg 200 mL/hr over 60 Minutes Intravenous Every 12 hours 09/01/22 0906 09/01/22 0907   09/01/22 1000  vancomycin (VANCOREADY) IVPB 1250 mg/250 mL        1,250 mg 166.7 mL/hr over 90 Minutes Intravenous Every 12 hours 09/01/22 0907     09/01/22 0900  ceFEPIme (MAXIPIME) 2 g in sodium chloride 0.9 % 100 mL IVPB  Status:  Discontinued        2 g 200 mL/hr over 30 Minutes Intravenous  Once 09/01/22 0852 09/01/22 0900   08/31/22 2300  vancomycin (VANCOCIN) IVPB 1000 mg/200 mL premix  Status:  Discontinued        1,000 mg 200 mL/hr over 60 Minutes Intravenous  Once 08/31/22 2251 08/31/22 2257   08/31/22 2300  ceFEPIme (MAXIPIME) 2 g in sodium chloride 0.9 % 100 mL IVPB        2 g 200 mL/hr over 30 Minutes Intravenous  Once 08/31/22 2251 09/01/22 0047   08/31/22 2300  vancomycin (VANCOCIN) IVPB 1000 mg/200 mL premix       See Hyperspace for full Linked Orders Report.   1,000 mg 200 mL/hr over 60 Minutes Intravenous  Once 08/31/22 2257 09/01/22 0156   08/31/22 2300  vancomycin (VANCOCIN) IVPB 1000 mg/200 mL premix       See Hyperspace for full Linked Orders Report.   1,000 mg 200 mL/hr over 60 Minutes Intravenous  Once 08/31/22 2257 09/01/22 0128       Subjective: Examined at bedside and he is resting in the bed and complaining  of some pain.  Wanted to get his nurse.  No  nausea or vomiting.  Feels okay.  No other concerns or close at the time.  Objective: Vitals:   09/03/22 1957 09/03/22 2335 09/04/22 0439 09/04/22 1136  BP: 139/88 (!) 136/90 (!) 153/96 (!) 150/100  Pulse: 93 90 88 93  Resp: 20 (!) 23 (!) 23 20  Temp: 98.5 F (36.9 C) 99.2 F (37.3 C) 99 F (37.2 C) 98.5 F (36.9 C)  TempSrc: Oral Oral Oral Oral  SpO2: 97% 96% 98% 95%  Weight:      Height:        Intake/Output Summary (Last 24 hours) at 09/04/2022 1413 Last data filed at 09/04/2022 0900 Gross per 24 hour  Intake 360 ml  Output 1000 ml  Net -640 ml   Filed Weights   08/31/22 2221  Weight: (!) 138.3 kg   Examination: Physical Exam:  Constitutional: WN/WD obese Caucasian male in NAD appears sleepy Respiratory: Diminished to auscultation bilaterally with coarse breath sounds worse on the Left compared to the Right, no wheezing, rales, rhonchi or crackles. Normal respiratory effort and patient is not tachypenic.  Wearing supplemental oxygen via nasal cannula and has a left chest tube Cardiovascular: RRR, no murmurs / rubs / gallops. S1 and S2 auscultated. No extremity edema. 2+ pedal pulses. No carotid bruits.  Abdomen: Soft, non-tender, Distended 2/2 body habitus. Bowel sounds positive.  GU: Deferred. Musculoskeletal: No clubbing / cyanosis of digits/nails. No joint deformity upper and lower extremities.  Skin: No rashes, lesions, ulcers on a limited skin evaluation. No induration; Warm and dry.  Neurologic: CN 2-12 grossly intact with no focal deficits. Romberg sign and cerebellar reflexes not assessed.  Psychiatric: Normal judgment and insight. Alert and oriented x 3. Normal mood and appropriate affect.   Data Reviewed: I have personally reviewed following labs and imaging studies  CBC: Recent Labs  Lab 08/31/22 2230 09/02/22 0431 09/03/22 0252 09/04/22 1103  WBC 6.8 5.3 7.0 4.6  NEUTROABS 4.6  --   --  2.8  HGB 12.7* 10.4* 10.6* 9.7*  HCT 38.1* 31.5* 31.0* 29.4*   MCV 93.4 92.9 91.2 92.5  PLT 320 243 293 [id number]   Basic Metabolic Panel: Recent Labs  Lab 08/31/22 2230 09/02/22 0431 09/03/22 0252 09/04/22 1103  NA 134* 138 136 139  K 4.0 4.1 4.5 3.9  CL 101 104 105 109  CO2 23 27 25 23   GLUCOSE 112* 108* 146* 113*  BUN 13 11 11 12   CREATININE 0.96 0.85 0.81 0.91  CALCIUM 8.5* 8.4* 8.3* 8.2*  MG  --   --   --  1.9  PHOS  --   --   --  2.4*   GFR: Estimated Creatinine Clearance: 152.9 mL/min (by C-G formula based on SCr of 0.91 mg/dL). Liver Function Tests: Recent Labs  Lab 08/31/22 2230 09/04/22 1103  AST 33 25  ALT 25 18  ALKPHOS 155* 93  BILITOT 0.3 0.4  PROT 7.4 5.6*  ALBUMIN 2.7* 2.0*   No results for input(s): "LIPASE", "AMYLASE" in the last 168 hours. No results for input(s): "AMMONIA" in the last 168 hours. Coagulation Profile: Recent Labs  Lab 08/31/22 2230  INR 1.1   Cardiac Enzymes: No results for input(s): "CKTOTAL", "CKMB", "CKMBINDEX", "TROPONINI" in the last 168 hours. BNP (last 3 results) No results for input(s): "PROBNP" in the last 8760 hours. HbA1C: No results for input(s): "HGBA1C" in the last 72 hours. CBG: Recent Labs  Lab 09/02/22 1848  GLUCAP 157*   Lipid Profile: No results for input(s): "CHOL", "HDL", "LDLCALC", "TRIG", "CHOLHDL", "LDLDIRECT" in the last 72 hours. Thyroid Function Tests: No results for input(s): "TSH", "T4TOTAL", "FREET4", "T3FREE", "THYROIDAB" in the last 72 hours. Anemia Panel: No results for input(s): "VITAMINB12", "FOLATE", "FERRITIN", "TIBC", "IRON", "RETICCTPCT" in the last 72 hours. Sepsis Labs: Recent Labs  Lab 08/31/22 2230 09/01/22 0127  LATICACIDVEN 1.8 1.1    Recent Results (from the past 240 hour(s))  Blood Culture (routine x 2)     Status: None (Preliminary result)   Collection Time: 08/31/22 10:40 PM   Specimen: BLOOD  Result Value Ref Range Status   Specimen Description BLOOD LEFT ARM  Final   Special Requests   Final    BOTTLES DRAWN AEROBIC AND  ANAEROBIC Blood Culture adequate volume   Culture   Final    NO GROWTH 4 DAYS Performed at [facility], [place] [address] [place], [place] [address]    Report Status PENDING  Incomplete  Resp panel by RT-PCR (RSV, Flu A&B, Covid) Anterior Nasal Swab     Status: None   Collection Time: 08/31/22 11:50 PM   Specimen: Anterior Nasal Swab  Result Value Ref Range Status   SARS Coronavirus 2 by RT PCR NEGATIVE NEGATIVE Final   Influenza A by PCR NEGATIVE NEGATIVE Final   Influenza B by PCR NEGATIVE NEGATIVE Final    Comment: (NOTE) The Xpert Xpress SARS-CoV-2/FLU/RSV plus assay is intended as an aid in the diagnosis of influenza from Nasopharyngeal swab specimens and should not be used as a sole basis for treatment. Nasal washings and aspirates are unacceptable for Xpert Xpress SARS-CoV-2/FLU/RSV testing.  Fact Sheet for Patients: EntrepreneurPulse.com.au  Fact Sheet for Healthcare Providers: IncredibleEmployment.be  This test is not yet approved or cleared by the [place] FDA and has been authorized for detection and/or diagnosis of SARS-CoV-2 by FDA under an Emergency Use Authorization (EUA). This EUA will remain in effect (meaning this test can be used) for the duration of the COVID-19 declaration under Section 564(b)(1) of the Act, 21 U.S.C. section 360bbb-3(b)(1), unless the authorization is terminated or revoked.     Resp Syncytial Virus by PCR NEGATIVE NEGATIVE Final    Comment: (NOTE) Fact Sheet for Patients: EntrepreneurPulse.com.au  Fact Sheet for Healthcare Providers: IncredibleEmployment.be  This test is not yet approved or cleared by the [place] FDA and has been authorized for detection and/or diagnosis of SARS-CoV-2 by FDA under an Emergency Use Authorization (EUA). This EUA will remain in effect (meaning this test can be used) for the duration of the COVID-19 declaration under  Section 564(b)(1) of the Act, 21 U.S.C. section 360bbb-3(b)(1), unless the authorization is terminated or revoked.  Performed at [facility], [place] [address] [place], [place] [address]   Blood Culture (routine x 2)     Status: None (Preliminary result)   Collection Time: 09/01/22 12:23 AM   Specimen: BLOOD RIGHT FOREARM  Result Value Ref Range Status   Specimen Description BLOOD RIGHT FOREARM  Final   Special Requests   Final    BOTTLES DRAWN AEROBIC AND ANAEROBIC Blood Culture adequate volume   Culture   Final    NO GROWTH 3 DAYS Performed at [facility], [place] [address] [place], [place] [address]    Report Status PENDING  Incomplete  Aerobic/Anaerobic Culture w Gram Stain (surgical/deep wound)     Status: None (Preliminary result)   Collection Time: 09/02/22  3:38 PM  Specimen: PATH Other; Body Fluid  Result Value Ref Range Status   Specimen Description FLUID PLEURAL  Final   Special Requests NONE  Final   Gram Stain NO WBC SEEN NO ORGANISMS SEEN   Final   Culture   Final    NO GROWTH 2 DAYS NO ANAEROBES ISOLATED; CULTURE IN PROGRESS FOR 5 DAYS Performed at [facility], [address], [place], [place] [address]    Report Status PENDING  Incomplete  Surgical pcr screen     Status: Abnormal   Collection Time: 09/02/22  4:03 PM   Specimen: Nasal Mucosa; Nasal Swab  Result Value Ref Range Status   MRSA, PCR NEGATIVE NEGATIVE Final   Staphylococcus aureus POSITIVE (A) NEGATIVE Final    Comment: (NOTE) The Xpert SA Assay (FDA approved for NASAL specimens in patients 3 years of age and older), is one component of a comprehensive surveillance program. It is not intended to diagnose infection nor to guide or monitor treatment. Performed at [facility], [place] [address], [place], [place] [address]     Radiology Studies: DG CHEST PORT 1 VIEW  Result Date: 09/04/2022 CLINICAL DATA:  Left pleural effusion. EXAM: PORTABLE CHEST 1 VIEW COMPARISON:   September 03, 2022. FINDINGS: Stable cardiomediastinal silhouette. Stable position of left-sided chest tube. Stable small loculated left pleural effusion is noted laterally. Stable diffuse left lung opacity is noted concerning for possible pneumonia. Right lung is unremarkable. Bony thorax is unremarkable. IMPRESSION: Stable small loculated left pleural effusion and diffuse left lung opacity as noted on prior exam. Stable position of left-sided chest tube. Electronically Signed   By: Marijo Conception M.D.   On: 09/04/2022 08:24   DG CHEST PORT 1 VIEW  Result Date: 09/03/2022 CLINICAL DATA:  Empyema status post chest tube placement EXAM: PORTABLE CHEST 1 VIEW COMPARISON:  Chest radiograph dated 09/02/2022 FINDINGS: Lines/tubes: Left lateral pleural catheter in-situ. Chest: Persistent asymmetrically decreased left lung aeration. Similar linear right midlung opacity. Pleura: Similar small left pleural effusion.  No pneumothorax. Heart/mediastinum: Similar  cardiomediastinal silhouette. Bones: No acute osseous abnormality. IMPRESSION: Left lateral pleural catheter in-situ with persistent asymmetrically decreased left lung aeration and small left pleural effusion. No pneumothorax. Electronically Signed   By: Darrin Nipper M.D.   On: 09/03/2022 08:22   DG CHEST PORT 1 VIEW  Result Date: 09/02/2022 CLINICAL DATA:  Empyema EXAM: PORTABLE CHEST 1 VIEW COMPARISON:  09/01/2022 FINDINGS: Previously seen left pleural pigtail catheter has been replaced with left chest tube. Decreasing left effusion with small residual effusion. No pneumothorax. Airspace disease throughout the left lung. Right upper lobe atelectasis. Heart and mediastinal contours within normal limits. No acute bony abnormality. IMPRESSION: Interval exchange of left pigtail catheter for left chest tube with decreasing left pleural effusion. Small left effusion remains. Diffuse left lung airspace disease. Electronically Signed   By: Rolm Baptise M.D.   On:  09/02/2022 19:08    Scheduled Meds:  buprenorphine-naloxone  1 tablet Sublingual BID   Chlorhexidine Gluconate Cloth  6 each Topical Q0600   cloNIDine  0.1 mg Oral BH-qamhs   Followed by   Derrill Memo ON 09/05/2022] cloNIDine  0.1 mg Oral QAC breakfast   enoxaparin (LOVENOX) injection  40 mg Subcutaneous Q24H   mupirocin ointment  1 Application Nasal BID   nicotine  21 mg Transdermal Daily   sodium chloride flush  3 mL Intravenous Q12H   Continuous Infusions:  ceFEPime (MAXIPIME) IV 2 g (09/04/22 1046)   vancomycin  1,250 mg (09/04/22 1135)    LOS: 3 days   Raiford Noble, DO [facility] Available via Epic secure chat 7am-7pm After these hours, please refer to coverage provider listed on [email or web address] 09/04/2022, 2:13 PM

## 2022-09-04 NOTE — Progress Notes (Signed)
TRH night cross cover note:   I was notified by RN of patient's request for medication for heartburn. I subsequently placed order for prn Tums for heartburn.      Babs Bertin, DO Hospitalist

## 2022-09-04 NOTE — Progress Notes (Signed)
Pharmacy Antibiotic Note  Derek Woods is a 32 y.o. male admitted on 08/31/2022 with empyema.  Pharmacy has been consulted for Vancomycin and Cefepime dosing. -WBC= 7, afebrile, SCr ~ 0.8 -cultures- ngtd  Plan: -Continue Vancomycin 1250mg  Q12H (eAUC 434, Scr 0.96, Vd 0.5) -Will check a vancomycin trough on 3/22 -Will follow renal function, cultures and clinical progress   Height: 5\' 6"  (167.6 cm) Weight: (!) 138.3 kg (305 lb) IBW/kg (Calculated) : 63.8  Temp (24hrs), Avg:98.9 F (37.2 C), Min:98.5 F (36.9 C), Max:99.2 F (37.3 C)  Recent Labs  Lab 08/31/22 2230 09/01/22 0127 09/02/22 0431 09/03/22 0252  WBC 6.8  --  5.3 7.0  CREATININE 0.96  --  0.85 0.81  LATICACIDVEN 1.8 1.1  --   --      Estimated Creatinine Clearance: 171.7 mL/min (by C-G formula based on SCr of 0.81 mg/dL).    Allergies  Allergen Reactions   Penicillins Rash    Childhood allergy Tolerated cefazolin and cefepime before    Thank you for allowing pharmacy to be a part of this patient's care.  Hildred Laser, PharmD Clinical Pharmacist **Pharmacist phone directory can now be found on [email or web address] ([email or web address]).  Listed under [place].

## 2022-09-05 ENCOUNTER — Inpatient Hospital Stay (HOSPITAL_COMMUNITY): Payer: Medicaid Other

## 2022-09-05 DIAGNOSIS — F1920 Other psychoactive substance dependence, uncomplicated: Secondary | ICD-10-CM | POA: Diagnosis not present

## 2022-09-05 DIAGNOSIS — J45909 Unspecified asthma, uncomplicated: Secondary | ICD-10-CM | POA: Diagnosis not present

## 2022-09-05 DIAGNOSIS — J869 Pyothorax without fistula: Secondary | ICD-10-CM | POA: Diagnosis not present

## 2022-09-05 LAB — CULTURE, BLOOD (ROUTINE X 2)
Culture: NO GROWTH
Special Requests: ADEQUATE

## 2022-09-05 NOTE — Progress Notes (Signed)
    Asked to consent patient for TEE originally placed on schedule for Monday. Attempted to speak with patient regarding procedure and rationale. He states "he does not want anything else put in me, I'm tired of all of this". He is not interested in further discussion. Advised we will remove him from the list. Will update primary team of patient's refusal for procedure.  Barnet Pall, NP-C 09/05/2022, 4:29 PM

## 2022-09-05 NOTE — Progress Notes (Signed)
Chest tube removed per MD order.  Pt tolerated well.  No complications to report at this time.    09/05/22 1415  Vitals  Temp 98.4 F (36.9 C)  Temp Source Oral  BP (!) 142/84  MAP (mmHg) 102  BP Location Left Wrist  BP Method Automatic  Patient Position (if appropriate) Lying  Pulse Rate 97  Pulse Rate Source Monitor  ECG Heart Rate 97  Resp 20  Level of Consciousness  Level of Consciousness Alert  Oxygen Therapy  SpO2 97 %  O2 Device Room Air  Pain Assessment  Pain Scale 0-10  Pain Score 2  POSS Scale ([place] Opioid Sedation Scale)  POSS *See Group Information* 1-Acceptable,Awake and alert  PCA/Epidural/Spinal Assessment  Respiratory Pattern Regular;Unlabored  Glasgow Coma Scale  Eye Opening 4  Best Verbal Response (NON-intubated) 5  Best Motor Response 6  Glasgow Coma Scale Score 15  MEWS Score  MEWS Temp 0  MEWS Systolic 0  MEWS Pulse 0  MEWS RR 0  MEWS LOC 0  MEWS Score 0  MEWS Score Color Green

## 2022-09-05 NOTE — Progress Notes (Signed)
Pt also refusing Lovenox shot.  MD notified

## 2022-09-05 NOTE — Progress Notes (Addendum)
      [address]       [place] [address]             [phone]       3 Days Post-Op Procedure(s) (LRB): VIDEO ASSISTED THORACOSCOPY (VATS)/DECORTICATION (Left)    Objective: Vital signs in last 24 hours: Temp:  [98.4 F (36.9 C)-99.1 F (37.3 C)] 99.1 F (37.3 C) (03/21 2343) Pulse Rate:  [93-109] 102 (03/21 2343) Cardiac Rhythm: Normal sinus rhythm (03/21 1900) Resp:  [20-21] 20 (03/21 2343) BP: (142-150)/(81-100) 149/81 (03/21 2343) SpO2:  [95 %-98 %] 95 % (03/21 2343)     Intake/Output from previous day: 03/21 0701 - 03/22 0700 In: 360 [P.O.:360] Out: 1815 [Urine:1725; Chest Tube:90]   Physical Exam:  Cardiovascular: RRR Chest Tube:to water seal. He has not had air leak last few days.  Lab Results: CBC: Recent Labs    09/03/22 0252 09/04/22 1103  WBC 7.0 4.6  HGB 10.6* 9.7*  HCT 31.0* 29.4*  PLT 293 268    BMET:  Recent Labs    09/03/22 0252 09/04/22 1103  NA 136 139  K 4.5 3.9  CL 105 109  CO2 25 23  GLUCOSE 146* 113*  BUN 11 12  CREATININE 0.81 0.91  CALCIUM 8.3* 8.2*     PT/INR:  No results for input(s): "LABPROT", "INR" in the last 72 hours.  ABG:  INR: Will add last result for INR, ABG once components are confirmed Will add last 4 CBG results once components are confirmed  Assessment/Plan:  1. CV - SR with HR in the 70-80's. 2.  Pulmonary - On room air. Chest tube with 90 cc of output since surgery. Chest tube to water seal. CXR ordered but not taken yet. If CXR stable, hope to remove chest tube. Check CXR in am. Encourage incentive spirometer. 3. Expected post op blood loss anemia-Last H and H this am stable at 9.7 and 29.4 4. ID-on Cefepime and Vancomycin. Left pleural fluid shows no growth for 2 days. Blood culture shows no growth for 3 days. 5. History of IVDU (Heroin)- Suboxone ;per medicine  Donielle M ZimmermanPA-C 09/05/2022,7:00 AM  Agree with above Awaiting CXR Will likely pull the tube  today  Taygan Connell O Elkin Belfield

## 2022-09-05 NOTE — Progress Notes (Signed)
[name] refusing IV antibiotics.  Says "his stomach can't take it anymore".  MD notified.

## 2022-09-05 NOTE — Progress Notes (Signed)
CSW called pt to discuss substance use resources. Pt requests to talk tomorrow or another time.

## 2022-09-05 NOTE — Progress Notes (Signed)
PROGRESS NOTE    Derek Woods  [id number] DOB: [birth year] DOA: 08/31/2022 PCP: Pcp, No   Brief Narrative:  The patient is a 32 year old morbidly obese Caucasian male with past medical history significant for but limited to IV drug abuse as well as other comorbidities who presents with shortness of breath.  He was recently at [facility] and they told him he had pneumonia for the past 5 months due to the loculated effusions and probable empyema.  He underwent a thoracentesis on 08/29/2022.  He left [facility] he has medical advice given that he is feeling better and came back to [name], ED.  He was noted to be hypoxic 3 L chest x-ray again showed complete opacification of left lung and he was placed on broad-spectrum antibiotics and received multiple fluid boluses.  CT surgery was consulted and the patient for a VATS on 09/01/2020.  Currently he is postoperative day 3 and after repeat chest x-ray today his chest tube was removed.  The case was discussed with infectious diseases who was confirmed for staphylococcal infection so they recommend obtaining a TEE and obtaining medical records from [facility].  ID is to see the patient in the morning as they feel that he may need prolonged IV antibiotics given that he is a drug user but currently he is refused to consent for TEE.  Currently refusing his antibiotics as well.  Assessment and Plan:  Empyema with Acute Respiratory Failure with hypoxia Left Pleural Effusion -S/p right-sided VATS on 10/09/2020 as well and is now s/p VATS on 09/02/2022 -Continue cefepime and vancomycin day #4 today. -Continue supportive care and further care per Cardiothoracic surgery -IVF Hydration now to stop -SpO2: 97 % O2 Flow Rate (L/min): 3 L/min -Continuous pulse oximetry maintain O2 saturation greater 90% -Continue supplemental oxygen via nasal cannula wean O2 as tolerated -Continue supportive care and nebs as below -Continue guaifenesin 600 mg p.o. twice  daily as needed cough and will add incentive spirometer as well as flutter valve -Continue with empiric antibiotics with IV cefepime and vancomycin and case was discussed with ID who recommends obtaining records from [facility] and likely long-term antibiotics given IV drug use; due to concern for staphylococcal infection recommended TEE which is going to be arranged for Tuesday however patient has refused to consent for it -Currently patient is refusing antibiotics due to abdominal discomfort and refused -CXR yesterday showed "Stable small loculated left pleural effusion and diffuse left lung opacity as noted on prior exam. Stable position of left-sided chest tube." -Cardiothoracic Surgery now placing the Chest tube to WaterSeal started and after repeat chest x-ray today showed"Stable left chest tube. Small left-sided pleural effusion similar to previous. Adjacent interstitial parenchymal lung opacity. Right lung is grossly clear except for a tiny right effusion. No right-sided pneumothorax or consolidation. Stable cardiopericardial silhouette. Overlapping cardiac leads. Film is underinflated." -CXR to be repeated in the AM and TCTS encouraging Incentive Spirometry and TCTS has remove the chest tube -Infectious Diseases to see the patient in the morning   Noncompliance with treatment -Patient is signed out AMA multiple times   Heroin dependence with ongoing IV use -Presently on clonidine detox and COWS protocol and is taking 0.1 mg of clonidine every morning and at bedtime for 4 doses and 0.1 g p.o. 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 every 8 as needed for muscle spasms as well as  p.o. acetaminophen 650 mg every 6 as needed for mild pain or fever   Asthma -C/w As needed inhaled bronchodilators albuterol 2.5 mg neb every 2 as needed for wheezing   Tobacco Use and Abuse -Smoking cessation counseling to be provided -C/w Nicotine patch 21 mg TD   Heartburn -Provided with PRN TUMS overnight -May need PPI   Normocytic Anemia with mild acute blood loss anemia -Hgb/Hct Trend: Recent Labs  Lab 08/31/22 2230 09/02/22 0431 09/03/22 0252 09/04/22 1103  HGB 12.7* 10.4* 10.6* 9.7*  HCT 38.1* 31.5* 31.0* 29.4*  MCV 93.4 92.9 91.2 92.5  -Check Anemia Panel in the AM; As expected blood loss -Continue to Monitor for S/Sx of Bleeding; No overt bleeding noted -Patient still has not had blood drawn yet and repeat is pending this a.m.; repeat CBC in the AM    Hypophosphatemia -Mild. Phos Level was 2.4 -Replete with po K Phos 500 mg x1 -Continue to Monitor and Replete as Necessary -Repeat Phos Level in the AM as the one today still pending to be drawn   Hypoalbuminemia -Patient's Albumin Trend: Recent Labs  Lab 08/31/22 2230 09/04/22 1103  ALBUMIN 2.7* 2.0*  -Continue to Monitor and Trend and repeat CMP in the AM   Morbid Obesity -Complicates overall prognosis and care -Estimated body mass index is 49.23 kg/m as calculated from the following:   Height as of this encounter: 5\' 6"  (1.676 m).   Weight as of this encounter: 138.3 kg.  -Weight Loss and Dietary Counseling given  DVT prophylaxis: enoxaparin (LOVENOX) injection 40 mg Start: 09/03/22 1000    Code Status: Full Code Family Communication: No family currently at bedside  Disposition Plan:  Level of care: Progressive Status is: Inpatient Remains inpatient appropriate because: Needs further clinical improvement and will need cardiothoracic surgery clearance as well as evaluation by ID   Consultants:  Cardiothoracic surgery  Infectious diseases  Procedures:  ECHOCARDIOGRAM IMPRESSIONS     1.  Left ventricular ejection fraction, by estimation, is 50 to 55%. The  left ventricle has low normal function. Left ventricular endocardial  border not optimally defined to evaluate regional wall motion. Left  ventricular diastolic parameters were  normal.   2. Right ventricular systolic function was not well visualized. The right  ventricular size is not well visualized. Tricuspid regurgitation signal is  inadequate for assessing PA pressure.   3. The mitral valve was not well visualized. No evidence of mitral valve  regurgitation.   4. The aortic valve is tricuspid. Aortic valve regurgitation is not  visualized. No aortic stenosis is present.   5. The inferior vena cava is normal in size with greater than 50%  respiratory variability, suggesting right atrial pressure of 3 mmHg.   6. Technically dificult study with very poor images. The valves were  barely visualized. This study cannot rule out endocarditis.   FINDINGS   Left Ventricle: Left ventricular ejection fraction, by estimation, is 50  to 55%. The left ventricle has low normal function. Left ventricular  endocardial border not optimally defined to evaluate regional wall motion.  The left ventricular internal cavity   size was normal in size. There is no left ventricular hypertrophy. Left  ventricular diastolic parameters were normal.   Right Ventricle: The right ventricular size is not well visualized. Right  vetricular wall thickness was not well visualized. Right ventricular  systolic function was not well visualized. Tricuspid regurgitation signal  is inadequate for assessing PA  pressure.   Left Atrium: Left atrial size was not well visualized.   Right Atrium: Right atrial size was not well visualized.   Pericardium: There is no evidence of pericardial effusion.   Mitral Valve: The mitral valve was not well visualized. No evidence of  mitral valve regurgitation.   Tricuspid Valve: The tricuspid valve is not well  visualized. Tricuspid  valve regurgitation is not demonstrated.   Aortic Valve: The aortic valve is tricuspid. Aortic valve regurgitation is  not visualized. No aortic stenosis is present.   Pulmonic Valve: The pulmonic valve was normal in structure. Pulmonic valve  regurgitation is not visualized.   Aorta: The aortic root is normal in size and structure.   Venous: The inferior vena cava is normal in size with greater than 50%  respiratory variability, suggesting right atrial pressure of 3 mmHg.   IAS/Shunts: The interatrial septum was not well visualized.     LEFT VENTRICLE  PLAX 2D  LVIDd:         5.10 cm   Diastology  LVIDs:         3.50 cm   LV e' medial:    13.30 cm/s  LV PW:         1.00 cm   LV E/e' medial:  8.5  LV IVS:        0.70 cm   LV e' lateral:   12.50 cm/s  LVOT diam:     1.80 cm   LV E/e' lateral: 9.0  LV SV:         52  LV SV Index:   22  LVOT Area:     2.54 cm     LEFT ATRIUM         Index  LA diam:    3.80 cm 1.59 cm/m   AORTIC VALVE  LVOT Vmax:   109.00 cm/s  LVOT Vmean:  69.900 cm/s  LVOT VTI:    0.204 m    AORTA  Ao Root diam: 3.30 cm  Ao Asc diam:  2.80 cm   MITRAL VALVE  MV Area (PHT): 5.31 cm     SHUNTS  MV Decel Time: 143 msec     Systemic VTI:  0.20 m  MV E velocity: 113.00 cm/s  Systemic Diam: 1.80 cm  MV A velocity: 68.90 cm/s  MV E/A ratio:  1.64    Procedure by Dr. Melodie Bouillon on 09/02/22: - left Video assisted thoracoscopy - Decortication - Intercostal nerve block  Antimicrobials:  Anti-infectives (From admission, onward)    Start     Dose/Rate Route Frequency Ordered Stop   09/01/22 1000  ceFEPIme (MAXIPIME) 2 g in sodium chloride 0.9 % 100 mL IVPB        2 g 200 mL/hr over 30 Minutes Intravenous Every 8 hours 09/01/22 0900     09/01/22 1000  vancomycin (VANCOCIN) IVPB 1000 mg/200 mL premix  Status:  Discontinued        1,000 mg 200 mL/hr over 60 Minutes Intravenous Every 12 hours 09/01/22 0906 09/01/22 0907    09/01/22 1000  vancomycin (VANCOREADY) IVPB 1250 mg/250 mL        1,250 mg 166.7 mL/hr over 90 Minutes Intravenous Every 12 hours 09/01/22 0907     09/01/22 0900  ceFEPIme (MAXIPIME) 2 g in sodium chloride 0.9 % 100 mL  IVPB  Status:  Discontinued        2 g 200 mL/hr over 30 Minutes Intravenous  Once 09/01/22 0852 09/01/22 0900   08/31/22 2300  vancomycin (VANCOCIN) IVPB 1000 mg/200 mL premix  Status:  Discontinued        1,000 mg 200 mL/hr over 60 Minutes Intravenous  Once 08/31/22 2251 08/31/22 2257   08/31/22 2300  ceFEPIme (MAXIPIME) 2 g in sodium chloride 0.9 % 100 mL IVPB        2 g 200 mL/hr over 30 Minutes Intravenous  Once 08/31/22 2251 09/01/22 0047   08/31/22 2300  vancomycin (VANCOCIN) IVPB 1000 mg/200 mL premix       See Hyperspace for full Linked Orders Report.   1,000 mg 200 mL/hr over 60 Minutes Intravenous  Once 08/31/22 2257 09/01/22 0156   08/31/22 2300  vancomycin (VANCOCIN) IVPB 1000 mg/200 mL premix       See Hyperspace for full Linked Orders Report.   1,000 mg 200 mL/hr over 60 Minutes Intravenous  Once 08/31/22 2257 09/01/22 0128       Subjective: And examined at bedside he is resting in bed watching television.  States that he still having some discomfort.  Had a very flat affect and refusing antibiotics given that it causes stomach pain.  No nausea or vomiting.  No other concerns or complaints at this time.  Refusing Lovenox and other interventions and did not consent for TEE  Objective: Vitals:   09/04/22 2343 09/05/22 1035 09/05/22 1403 09/05/22 1415  BP: (!) 149/81 (!) 155/95 (!) 152/92 (!) 142/84  Pulse: (!) 102 90 94 97  Resp: 20 20 20 20   Temp: 99.1 F (37.3 C) 98.6 F (37 C)  98.4 F (36.9 C)  TempSrc: Oral Oral  Oral  SpO2: 95% 96% 95% 97%  Weight:      Height:        Intake/Output Summary (Last 24 hours) at 09/05/2022 1545 Last data filed at 09/05/2022 1300 Gross per 24 hour  Intake --  Output 2315 ml  Net -2315 ml   Filed Weights    08/31/22 2221  Weight: (!) 138.3 kg   Examination: Physical Exam:  Constitutional: WN/WD morbidly obese Caucasian male in no acute distress Respiratory: Diminished to auscultation bilaterally with coarse breath sounds worse on the left compared to right and some slight rhonchi but no appreciable crackles or rails.  No appreciable wheezing and he has a normal respiratory effort and is not wearing supplemental oxygen via nasal cannula but still had his chest tube in place Cardiovascular: RRR, no murmurs / rubs / gallops. S1 and S2 auscultated. No extremity edema.  Abdomen: Soft, non-tender, distended secondary to body habitus. Bowel sounds positive.  GU: Deferred. Musculoskeletal: No clubbing / cyanosis of digits/nails. No joint deformity upper and lower extremities.  Skin: No rashes, lesions, ulcers on limited skin evaluation. No induration; Warm and dry.  Neurologic: CN 2-12 grossly intact with no focal deficits.  Romberg sign cerebellar reflexes not assessed.  Psychiatric: Normal judgment and insight. Alert and oriented x 3. Normal mood and slightly flat affect.  Data Reviewed: I have personally reviewed following labs and imaging studies  CBC: Recent Labs  Lab 08/31/22 2230 09/02/22 0431 09/03/22 0252 09/04/22 1103  WBC 6.8 5.3 7.0 4.6  NEUTROABS 4.6  --   --  2.8  HGB 12.7* 10.4* 10.6* 9.7*  HCT 38.1* 31.5* 31.0* 29.4*  MCV 93.4 92.9 91.2 92.5  PLT 320 243 293 268  Basic Metabolic Panel: Recent Labs  Lab 08/31/22 2230 09/02/22 0431 09/03/22 0252 09/04/22 1103  NA 134* 138 136 139  K 4.0 4.1 4.5 3.9  CL 101 104 105 109  CO2 23 27 25 23   GLUCOSE 112* 108* 146* 113*  BUN 13 11 11 12   CREATININE 0.96 0.85 0.81 0.91  CALCIUM 8.5* 8.4* 8.3* 8.2*  MG  --   --   --  1.9  PHOS  --   --   --  2.4*   GFR: Estimated Creatinine Clearance: 152.9 mL/min (by C-G formula based on SCr of 0.91 mg/dL). Liver Function Tests: Recent Labs  Lab 08/31/22 2230 09/04/22 1103  AST 33  25  ALT 25 18  ALKPHOS 155* 93  BILITOT 0.3 0.4  PROT 7.4 5.6*  ALBUMIN 2.7* 2.0*   No results for input(s): "LIPASE", "AMYLASE" in the last 168 hours. No results for input(s): "AMMONIA" in the last 168 hours. Coagulation Profile: Recent Labs  Lab 08/31/22 2230  INR 1.1   Cardiac Enzymes: No results for input(s): "CKTOTAL", "CKMB", "CKMBINDEX", "TROPONINI" in the last 168 hours. BNP (last 3 results) No results for input(s): "PROBNP" in the last 8760 hours. HbA1C: No results for input(s): "HGBA1C" in the last 72 hours. CBG: Recent Labs  Lab 09/02/22 1848  GLUCAP 157*   Lipid Profile: No results for input(s): "CHOL", "HDL", "LDLCALC", "TRIG", "CHOLHDL", "LDLDIRECT" in the last 72 hours. Thyroid Function Tests: No results for input(s): "TSH", "T4TOTAL", "FREET4", "T3FREE", "THYROIDAB" in the last 72 hours. Anemia Panel: No results for input(s): "VITAMINB12", "FOLATE", "FERRITIN", "TIBC", "IRON", "RETICCTPCT" in the last 72 hours. Sepsis Labs: Recent Labs  Lab 08/31/22 2230 09/01/22 0127  LATICACIDVEN 1.8 1.1    Recent Results (from the past 240 hour(s))  Blood Culture (routine x 2)     Status: None   Collection Time: 08/31/22 10:40 PM   Specimen: BLOOD  Result Value Ref Range Status   Specimen Description BLOOD LEFT ARM  Final   Special Requests   Final    BOTTLES DRAWN AEROBIC AND ANAEROBIC Blood Culture adequate volume   Culture   Final    NO GROWTH 5 DAYS Performed at [facility], [address] [place], [place] [address]    Report Status 09/05/2022 FINAL  Final  Resp panel by RT-PCR (RSV, Flu A&B, Covid) Anterior Nasal Swab     Status: None   Collection Time: 08/31/22 11:50 PM   Specimen: Anterior Nasal Swab  Result Value Ref Range Status   SARS Coronavirus 2 by RT PCR NEGATIVE NEGATIVE Final   Influenza A by PCR NEGATIVE NEGATIVE Final   Influenza B by PCR NEGATIVE NEGATIVE Final    Comment: (NOTE) The Xpert Xpress SARS-CoV-2/FLU/RSV plus assay  is intended as an aid in the diagnosis of influenza from Nasopharyngeal swab specimens and should not be used as a sole basis for treatment. Nasal washings and aspirates are unacceptable for Xpert Xpress SARS-CoV-2/FLU/RSV testing.  Fact Sheet for Patients: EntrepreneurPulse.com.au  Fact Sheet for Healthcare Providers: IncredibleEmployment.be  This test is not yet approved or cleared by the [place] FDA and has been authorized for detection and/or diagnosis of SARS-CoV-2 by FDA under an Emergency Use Authorization (EUA). This EUA will remain in effect (meaning this test can be used) for the duration of the COVID-19 declaration under Section 564(b)(1) of the Act, 21 U.S.C. section 360bbb-3(b)(1), unless the authorization is terminated or revoked.     Resp Syncytial Virus by PCR NEGATIVE  NEGATIVE Final    Comment: (NOTE) Fact Sheet for Patients: EntrepreneurPulse.com.au  Fact Sheet for Healthcare Providers: IncredibleEmployment.be  This test is not yet approved or cleared by the [place] FDA and has been authorized for detection and/or diagnosis of SARS-CoV-2 by FDA under an Emergency Use Authorization (EUA). This EUA will remain in effect (meaning this test can be used) for the duration of the COVID-19 declaration under Section 564(b)(1) of the Act, 21 U.S.C. section 360bbb-3(b)(1), unless the authorization is terminated or revoked.  Performed at [facility], [place] [address], [place], [place] [address]   Blood Culture (routine x 2)     Status: None (Preliminary result)   Collection Time: 09/01/22 12:23 AM   Specimen: BLOOD RIGHT FOREARM  Result Value Ref Range Status   Specimen Description BLOOD RIGHT FOREARM  Final   Special Requests   Final    BOTTLES DRAWN AEROBIC AND ANAEROBIC Blood Culture adequate volume   Culture   Final    NO GROWTH 4 DAYS Performed at [facility],  [place] [address] [place], [place] [address]    Report Status PENDING  Incomplete  Aerobic/Anaerobic Culture w Gram Stain (surgical/deep wound)     Status: None (Preliminary result)   Collection Time: 09/02/22  3:38 PM   Specimen: PATH Other; Body Fluid  Result Value Ref Range Status   Specimen Description FLUID PLEURAL  Final   Special Requests NONE  Final   Gram Stain NO WBC SEEN NO ORGANISMS SEEN   Final   Culture   Final    NO GROWTH 3 DAYS NO ANAEROBES ISOLATED; CULTURE IN PROGRESS FOR 5 DAYS Performed at [facility], [address], [place], [place] [address]    Report Status PENDING  Incomplete  Surgical pcr screen     Status: Abnormal   Collection Time: 09/02/22  4:03 PM   Specimen: Nasal Mucosa; Nasal Swab  Result Value Ref Range Status   MRSA, PCR NEGATIVE NEGATIVE Final   Staphylococcus aureus POSITIVE (A) NEGATIVE Final    Comment: (NOTE) The Xpert SA Assay (FDA approved for NASAL specimens in patients 50 years of age and older), is one component of a comprehensive surveillance program. It is not intended to diagnose infection nor to guide or monitor treatment. Performed at [facility], [place] [address] [place],  [address]     Radiology Studies: DG CHEST PORT 1 VIEW  Result Date: 09/05/2022 CLINICAL DATA:  Pleural effusion EXAM: PORTABLE CHEST 1 VIEW COMPARISON:  X-ray 09/04/2022 FINDINGS: Stable left chest tube. Small left-sided pleural effusion similar to previous. Adjacent interstitial parenchymal lung opacity. Right lung is grossly clear except for a tiny right effusion. No right-sided pneumothorax or consolidation. Stable cardiopericardial silhouette. Overlapping cardiac leads. Film is underinflated. IMPRESSION: No significant interval change when adjusting for technique Electronically Signed   By: Jill Side M.D.   On: 09/05/2022 12:29   DG CHEST PORT 1 VIEW  Result Date: 09/04/2022 CLINICAL DATA:  Left pleural effusion. EXAM: PORTABLE CHEST 1  VIEW COMPARISON:  September 03, 2022. FINDINGS: Stable cardiomediastinal silhouette. Stable position of left-sided chest tube. Stable small loculated left pleural effusion is noted laterally. Stable diffuse left lung opacity is noted concerning for possible pneumonia. Right lung is unremarkable. Bony thorax is unremarkable. IMPRESSION: Stable small loculated left pleural effusion and diffuse left lung opacity as noted on prior exam. Stable position of left-sided chest tube. Electronically Signed   By: Marijo Conception  M.D.   On: 09/04/2022 08:24    Scheduled Meds:  buprenorphine-naloxone  1 tablet Sublingual BID   Chlorhexidine Gluconate Cloth  6 each Topical Q0600   cloNIDine  0.1 mg Oral QAC breakfast   enoxaparin (LOVENOX) injection  40 mg Subcutaneous Q24H   mupirocin ointment  1 Application Nasal BID   nicotine  21 mg Transdermal Daily   saccharomyces boulardii  250 mg Oral BID   sodium chloride flush  3 mL Intravenous Q12H   Continuous Infusions:  ceFEPime (MAXIPIME) IV 2 g (09/05/22 0053)   vancomycin 1,250 mg (09/04/22 2050)    LOS: 4 days   Raiford Noble, DO [facility] Available via Epic secure chat 7am-7pm After these hours, please refer to coverage provider listed on [email or web address] 09/05/2022, 3:45 PM

## 2022-09-06 ENCOUNTER — Inpatient Hospital Stay (HOSPITAL_COMMUNITY): Payer: Medicaid Other

## 2022-09-06 DIAGNOSIS — F1920 Other psychoactive substance dependence, uncomplicated: Secondary | ICD-10-CM | POA: Diagnosis not present

## 2022-09-06 DIAGNOSIS — J869 Pyothorax without fistula: Secondary | ICD-10-CM | POA: Diagnosis not present

## 2022-09-06 DIAGNOSIS — F1990 Other psychoactive substance use, unspecified, uncomplicated: Secondary | ICD-10-CM

## 2022-09-06 DIAGNOSIS — J45909 Unspecified asthma, uncomplicated: Secondary | ICD-10-CM | POA: Diagnosis not present

## 2022-09-06 LAB — CULTURE, BLOOD (ROUTINE X 2)
Culture: NO GROWTH
Special Requests: ADEQUATE

## 2022-09-06 LAB — ACID FAST SMEAR (AFB, MYCOBACTERIA): Acid Fast Smear: NEGATIVE

## 2022-09-06 MED ORDER — LINEZOLID 600 MG PO TABS
600.0000 mg | ORAL_TABLET | Freq: Two times a day (BID) | ORAL | Status: DC
  Administered 2022-09-06 – 2022-09-08 (×5): 600 mg via ORAL
  Filled 2022-09-06 (×6): qty 1

## 2022-09-06 MED ORDER — PANTOPRAZOLE SODIUM 40 MG IV SOLR
40.0000 mg | Freq: Two times a day (BID) | INTRAVENOUS | Status: DC
  Administered 2022-09-06 (×2): 40 mg via INTRAVENOUS
  Filled 2022-09-06 (×2): qty 10

## 2022-09-06 MED ORDER — LEVOFLOXACIN 750 MG PO TABS
750.0000 mg | ORAL_TABLET | Freq: Every day | ORAL | Status: DC
  Administered 2022-09-06 – 2022-09-08 (×3): 750 mg via ORAL
  Filled 2022-09-06 (×3): qty 1

## 2022-09-06 MED ORDER — METHOCARBAMOL 500 MG PO TABS
500.0000 mg | ORAL_TABLET | Freq: Three times a day (TID) | ORAL | Status: DC | PRN
  Administered 2022-09-06 – 2022-09-08 (×5): 500 mg via ORAL
  Filled 2022-09-06 (×5): qty 1

## 2022-09-06 MED ORDER — SODIUM CHLORIDE 0.9 % IV SOLN: INTRAVENOUS | Status: DC

## 2022-09-06 MED ORDER — LOPERAMIDE HCL 2 MG PO CAPS: 2.0000 mg | ORAL_CAPSULE | ORAL | Status: DC | PRN

## 2022-09-06 MED ORDER — DICYCLOMINE HCL 20 MG PO TABS: 20.0000 mg | ORAL_TABLET | Freq: Four times a day (QID) | ORAL | Status: DC | PRN

## 2022-09-06 MED ORDER — PROCHLORPERAZINE EDISYLATE 10 MG/2ML IJ SOLN: 10.0000 mg | Freq: Four times a day (QID) | INTRAMUSCULAR | Status: DC | PRN

## 2022-09-06 MED ORDER — HYDROXYZINE HCL 25 MG PO TABS
25.0000 mg | ORAL_TABLET | Freq: Four times a day (QID) | ORAL | Status: DC | PRN
  Administered 2022-09-06 – 2022-09-08 (×5): 25 mg via ORAL
  Filled 2022-09-06 (×5): qty 1

## 2022-09-06 NOTE — Progress Notes (Signed)
PROGRESS NOTE    Derek Woods  [id number] DOB: 07-21-1990 DOA: 08/31/2022 PCP: Pcp, No   Brief Narrative:  The patient is a 33 year old morbidly obese Caucasian male with past medical history significant for but limited to IV drug abuse as well as other comorbidities who presents with shortness of breath.  He was recently at [facility] and they told him he had pneumonia for the past 5 months due to the loculated effusions and probable empyema.  He underwent a thoracentesis on 08/29/2022.  He left [facility] he has medical advice given that he is feeling better and came back to [name] ED.  He was noted to be hypoxic 3 L chest x-ray again showed complete opacification of left lung and he was placed on broad-spectrum antibiotics and received multiple fluid boluses.  CT surgery was consulted and the patient for a VATS on 09/01/2020.  Currently he is postoperative day 3 and after repeat chest x-ray today his chest tube was removed.  The case was discussed with infectious diseases who was confirmed for staphylococcal infection so they recommend obtaining a TEE and obtaining medical records from [facility].  ID is to see the patient in the morning as they feel that he may need prolonged IV antibiotics given that he is a drug user but currently he is refused to consent for TEE.  Currently refusing his antibiotics as well.  ID has now been consulted and they are recommending continuing p.o. antibiotics and changed him off the IV antibiotics given his refusal.  Will get a CT scan of his abdomen pelvis given his nausea and vomiting.  Been refusing quite a bit of interventions while he has been hospitalized including antibiotics, blood draws as well as IV fluids.  Assessment and Plan:  Empyema with Acute Respiratory Failure with hypoxia Left Pleural Effusion -S/p right-sided VATS on 10/09/2020 as well and is now s/p VATS on 09/02/2022 -Continue cefepime and vancomycin day #4 today. -Continue  supportive care and further care per Cardiothoracic surgery -IVF Hydration now to stop SpO2: 93 % O2 Flow Rate (L/min): 3 L/min -Continuous pulse oximetry maintain O2 saturation greater 90% -Continue supplemental oxygen via nasal cannula wean O2 as tolerated -Continue supportive care and nebs as below -Continue guaifenesin 600 mg p.o. twice daily as needed cough and will add incentive spirometer as well as flutter valve -Continue with empiric antibiotics with IV cefepime and vancomycin and case was discussed with ID who recommends obtaining records from [place] and likely long-term antibiotics given IV drug use; due to concern for staphylococcal infection recommended TEE which is going to be arranged for Tuesday however patient has refused to consent for it -Currently patient is refusing antibiotics due to abdominal discomfort and refused again today but is agreeable to try oral; -Chest tube has been removed -Chest x-ray done and showed "Alveolar opacity left hemithorax consistent with mid to lower lung pneumonia. Small left-sided parapneumonic effusion. Right lung clear. Normal pulmonary vasculature. No pneumothorax." -Further care per cardiothoracic surgery -Infectious diseases was consulted and they feel that his IV drug use has caused occult bacteremia leading to empyema with no other obvious metastatic focus Blood cultures have been negative -ID has now changed his antibiotics to p.o. Linezolid 600 mg po twice daily and Levofloxacin 750 mg p.o. daily to finish a 14-day course on 09/16/2022 -Patient to have ID follow-up in 3 to 4 weeks and has a follow-up on 10/14/2022 at 930   Noncompliance with Treatment -Patient is signed out [facility] multiple  times now refusing blood work, interventions IV fluids and refusing IV antibiotics   Heroin Dependence with ongoing IV use -Presently on clonidine detox and COWS protocol and is taking 0.1 mg of clonidine every morning and at bedtime for 4 doses and 0.1 g  p.o. 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 every 8 as needed for muscle spasms as well as p.o. acetaminophen 650 mg every 6 as needed for mild pain or fever   Asthma -C/w As needed inhaled bronchodilators albuterol 2.5 mg neb every 2 as needed for wheezing   Tobacco Use and Abuse -Smoking cessation counseling to be provided -C/w Nicotine patch 21 mg TD   Heartburn -Provided with PRN TUMS overnight -Have added PPI   Nausea and Vomiting -Obtain CT scan of the abdomen pelvis given his nausea vomiting -Continue supportive care -Wanted to start IV fluids at 100 MLS per hour but patient refused -Continue supportive care and antiemetics with p.o./IV ondansetron as well as IV Compazine as needed  Normocytic Anemia with mild acute blood loss anemia -Hgb/Hct Trend: Recent Labs  Lab 08/31/22 2230 09/02/22 0431 09/03/22 0252 09/04/22 1103  HGB 12.7* 10.4* 10.6* 9.7*  HCT 38.1* 31.5* 31.0* 29.4*  MCV 93.4 92.9 91.2 92.5  -Check Anemia Panel in the AM; As expected blood loss -Continue to Monitor for S/Sx of Bleeding; No overt bleeding noted -Patient has been refusing blood draws   Hypophosphatemia -Mild. Phos Level was 2.4 on last check -Replete with po K Phos 500 mg x1 -Continue to Monitor and Replete as Necessary -Refuses blood work last few days   Hypoalbuminemia -Patient's Albumin Trend: Recent Labs  Lab 08/31/22 2230 09/04/22 1103  ALBUMIN 2.7* 2.0*  -Continue to Monitor and Trend and repeat CMP in the AM   Morbid Obesity -Complicates overall prognosis and care -Estimated body mass index is 49.23 kg/m as calculated from the following:   Height as of  this encounter: 5\' 6"  (1.676 m).   Weight as of this encounter: 138.3 kg.  -Weight Loss and Dietary Counseling given  DVT prophylaxis: enoxaparin (LOVENOX) injection 40 mg Start: 09/03/22 1000    Code Status: Full Code Family Communication: No family currently at bedside  Disposition Plan:  Level of care: Progressive Status is: Inpatient Remains inpatient appropriate because: Needs further clinical improvement and clearance by the specialist including cardiothoracic surgery as well as ID   Consultants:  Cardiothoracic Surgery Infectious Diseases   Procedures:  As delineated as above  Antimicrobials:  Anti-infectives (From admission, onward)    Start     Dose/Rate Route Frequency Ordered Stop   09/06/22 1200  linezolid (ZYVOX) tablet 600 mg        600 mg Oral Every 12 hours 09/06/22 1108 09/15/22 0959   09/06/22 1200  levofloxacin (LEVAQUIN) tablet 750 mg        750 mg Oral Daily 09/06/22 1108 09/15/22 0959   09/01/22 1000  ceFEPIme (MAXIPIME) 2 g in sodium chloride 0.9 % 100 mL IVPB  Status:  Discontinued        2 g 200 mL/hr over 30 Minutes Intravenous Every 8 hours 09/01/22 0900 09/06/22 1108   09/01/22 1000  vancomycin (VANCOCIN) IVPB 1000 mg/200 mL  premix  Status:  Discontinued        1,000 mg 200 mL/hr over 60 Minutes Intravenous Every 12 hours 09/01/22 0906 09/01/22 0907   09/01/22 1000  vancomycin (VANCOREADY) IVPB 1250 mg/250 mL  Status:  Discontinued        1,250 mg 166.7 mL/hr over 90 Minutes Intravenous Every 12 hours 09/01/22 0907 09/06/22 1108   09/01/22 0900  ceFEPIme (MAXIPIME) 2 g in sodium chloride 0.9 % 100 mL IVPB  Status:  Discontinued        2 g 200 mL/hr over 30 Minutes Intravenous  Once 09/01/22 0852 09/01/22 0900   08/31/22 2300  vancomycin (VANCOCIN) IVPB 1000 mg/200 mL premix  Status:  Discontinued        1,000 mg 200 mL/hr over 60 Minutes Intravenous  Once 08/31/22 2251 08/31/22 2257   08/31/22 2300  ceFEPIme (MAXIPIME) 2 g in sodium chloride 0.9  % 100 mL IVPB        2 g 200 mL/hr over 30 Minutes Intravenous  Once 08/31/22 2251 09/01/22 0047   08/31/22 2300  vancomycin (VANCOCIN) IVPB 1000 mg/200 mL premix       See Hyperspace for full Linked Orders Report.   1,000 mg 200 mL/hr over 60 Minutes Intravenous  Once 08/31/22 2257 09/01/22 0156   08/31/22 2300  vancomycin (VANCOCIN) IVPB 1000 mg/200 mL premix       See Hyperspace for full Linked Orders Report.   1,000 mg 200 mL/hr over 60 Minutes Intravenous  Once 08/31/22 2257 09/01/22 0128       Subjective: Seen and examined at bedside he was resting and did not really want to participate in his care.  Had some nausea vomiting earlier and does not want to take his antibiotics.  Agreeable to the CT scan but did not want to do contrast and okay with changing antibiotics to p.o.   Objective: Vitals:   09/05/22 1415 09/05/22 1819 09/05/22 1941 09/06/22 1404  BP: (!) 142/84  (!) 148/102 (!) 153/95  Pulse: 97  89 88  Resp: 20 20 (!) 23 20  Temp: 98.4 F (36.9 C)  99.5 F (37.5 C) 99 F (37.2 C)  TempSrc: Oral  Oral Oral  SpO2: 97%  96% 93%  Weight:      Height:        Intake/Output Summary (Last 24 hours) at 09/06/2022 1827 Last data filed at 09/06/2022 0700 Gross per 24 hour  Intake --  Output 600 ml  Net -600 ml   Filed Weights   08/31/22 2221  Weight: (!) 138.3 kg   Examination: Physical Exam:  Constitutional: WN/WD morbidly obese Caucasian male who is resting and wanting to sleep and be left alone Respiratory: Diminished to auscultation bilaterally with some coarse breath sounds worse on the left compared to the right, no wheezing, rales, rhonchi or crackles. Normal respiratory effort and patient is not tachypenic. No accessory muscle use.  Unlabored breathing and is wearing no supplemental oxygen via nasal cannula Cardiovascular: RRR, no murmurs / rubs / gallops. S1 and S2 auscultated. No extremity edema.  Abdomen: Soft, mildly-tender, distended secondary to body  habitus. Bowel sounds positive.  GU: Deferred. Musculoskeletal: No clubbing / cyanosis of digits/nails. No joint deformity upper and lower extremities.  Skin: No rashes, lesions, ulcers on limited skin evaluation. No induration; Warm and dry.  Neurologic: CN 2-12 grossly intact with no focal deficits but he is a little somnolent and drowsy and not wanting to be disturbed. Romberg sign  and cerebellar reflexes not assessed.  Psychiatric: Normal judgment and insight. Alert and oriented x 3. Normal mood and appropriate affect.   Data Reviewed: I have personally reviewed following labs and imaging studies  CBC: Recent Labs  Lab 08/31/22 2230 09/02/22 0431 09/03/22 0252 09/04/22 1103  WBC 6.8 5.3 7.0 4.6  NEUTROABS 4.6  --   --  2.8  HGB 12.7* 10.4* 10.6* 9.7*  HCT 38.1* 31.5* 31.0* 29.4*  MCV 93.4 92.9 91.2 92.5  PLT 320 243 293 [id number]   Basic Metabolic Panel: Recent Labs  Lab 08/31/22 2230 09/02/22 0431 09/03/22 0252 09/04/22 1103  NA 134* 138 136 139  K 4.0 4.1 4.5 3.9  CL 101 104 105 109  CO2 23 27 25 23   GLUCOSE 112* 108* 146* 113*  BUN 13 11 11 12   CREATININE 0.96 0.85 0.81 0.91  CALCIUM 8.5* 8.4* 8.3* 8.2*  MG  --   --   --  1.9  PHOS  --   --   --  2.4*   GFR: Estimated Creatinine Clearance: 152.9 mL/min (by C-G formula based on SCr of 0.91 mg/dL). Liver Function Tests: Recent Labs  Lab 08/31/22 2230 09/04/22 1103  AST 33 25  ALT 25 18  ALKPHOS 155* 93  BILITOT 0.3 0.4  PROT 7.4 5.6*  ALBUMIN 2.7* 2.0*   No results for input(s): "LIPASE", "AMYLASE" in the last 168 hours. No results for input(s): "AMMONIA" in the last 168 hours. Coagulation Profile: Recent Labs  Lab 08/31/22 2230  INR 1.1   Cardiac Enzymes: No results for input(s): "CKTOTAL", "CKMB", "CKMBINDEX", "TROPONINI" in the last 168 hours. BNP (last 3 results) No results for input(s): "PROBNP" in the last 8760 hours. HbA1C: No results for input(s): "HGBA1C" in the last 72 hours. CBG: Recent  Labs  Lab 09/02/22 1848  GLUCAP 157*   Lipid Profile: No results for input(s): "CHOL", "HDL", "LDLCALC", "TRIG", "CHOLHDL", "LDLDIRECT" in the last 72 hours. Thyroid Function Tests: No results for input(s): "TSH", "T4TOTAL", "FREET4", "T3FREE", "THYROIDAB" in the last 72 hours. Anemia Panel: No results for input(s): "VITAMINB12", "FOLATE", "FERRITIN", "TIBC", "IRON", "RETICCTPCT" in the last 72 hours. Sepsis Labs: Recent Labs  Lab 08/31/22 2230 09/01/22 0127  LATICACIDVEN 1.8 1.1    Recent Results (from the past 240 hour(s))  Blood Culture (routine x 2)     Status: None   Collection Time: 08/31/22 10:40 PM   Specimen: BLOOD  Result Value Ref Range Status   Specimen Description BLOOD LEFT ARM  Final   Special Requests   Final    BOTTLES DRAWN AEROBIC AND ANAEROBIC Blood Culture adequate volume   Culture   Final    NO GROWTH 5 DAYS Performed at [facility], [address] [place], [place] [address]    Report Status 09/05/2022 FINAL  Final  Resp panel by RT-PCR (RSV, Flu A&B, Covid) Anterior Nasal Swab     Status: None   Collection Time: 08/31/22 11:50 PM   Specimen: Anterior Nasal Swab  Result Value Ref Range Status   SARS Coronavirus 2 by RT PCR NEGATIVE NEGATIVE Final   Influenza A by PCR NEGATIVE NEGATIVE Final   Influenza B by PCR NEGATIVE NEGATIVE Final    Comment: (NOTE) The Xpert Xpress SARS-CoV-2/FLU/RSV plus assay is intended as an aid in the diagnosis of influenza from Nasopharyngeal swab specimens and should not be used as a sole basis for treatment. Nasal washings and aspirates are unacceptable for Xpert Xpress SARS-CoV-2/FLU/RSV testing.  Fact  Sheet for Patients: EntrepreneurPulse.com.au  Fact Sheet for Healthcare Providers: IncredibleEmployment.be  This test is not yet approved or cleared by the [place] FDA and has been authorized for detection and/or diagnosis of SARS-CoV-2 by FDA under an Emergency  Use Authorization (EUA). This EUA will remain in effect (meaning this test can be used) for the duration of the COVID-19 declaration under Section 564(b)(1) of the Act, 21 U.S.C. section 360bbb-3(b)(1), unless the authorization is terminated or revoked.     Resp Syncytial Virus by PCR NEGATIVE NEGATIVE Final    Comment: (NOTE) Fact Sheet for Patients: EntrepreneurPulse.com.au  Fact Sheet for Healthcare Providers: IncredibleEmployment.be  This test is not yet approved or cleared by the [place] FDA and has been authorized for detection and/or diagnosis of SARS-CoV-2 by FDA under an Emergency Use Authorization (EUA). This EUA will remain in effect (meaning this test can be used) for the duration of the COVID-19 declaration under Section 564(b)(1) of the Act, 21 U.S.C. section 360bbb-3(b)(1), unless the authorization is terminated or revoked.  Performed at [facility], [place] [address] [place], [place] [address]   Blood Culture (routine x 2)     Status: None   Collection Time: 09/01/22 12:23 AM   Specimen: BLOOD RIGHT FOREARM  Result Value Ref Range Status   Specimen Description BLOOD RIGHT FOREARM  Final   Special Requests   Final    BOTTLES DRAWN AEROBIC AND ANAEROBIC Blood Culture adequate volume   Culture   Final    NO GROWTH 5 DAYS Performed at [facility], [place] [address], [place], [place] [address]    Report Status 09/06/2022 FINAL  Final  Aerobic/Anaerobic Culture w Gram Stain (surgical/deep wound)     Status: None (Preliminary result)   Collection Time: 09/02/22  3:38 PM   Specimen: PATH Other; Body Fluid  Result Value Ref Range Status   Specimen Description FLUID PLEURAL  Final   Special Requests NONE  Final   Gram Stain NO WBC SEEN NO ORGANISMS SEEN   Final   Culture   Final    NO GROWTH 4 DAYS NO ANAEROBES ISOLATED; CULTURE IN PROGRESS FOR 5 DAYS Performed at [facility], [address]  [place], [place] [address]    Report Status PENDING  Incomplete  Acid Fast Smear (AFB)     Status: None   Collection Time: 09/02/22  3:38 PM   Specimen: PATH Other; Body Fluid  Result Value Ref Range Status   AFB Specimen Processing Concentration  Final   Acid Fast Smear Negative  Final    Comment: (NOTE) Performed At: [facility] [place], [place] [id number] Rush Farmer MD [id number]    Source (AFB) FLUID  Final    Comment: PLEURAL Performed at [facility], [place] [address] [place], [place] [address]   Surgical pcr screen     Status: Abnormal   Collection Time: 09/02/22  4:03 PM   Specimen: Nasal Mucosa; Nasal Swab  Result Value Ref Range Status   MRSA, PCR NEGATIVE NEGATIVE Final   Staphylococcus aureus POSITIVE (A) NEGATIVE Final    Comment: (NOTE) The Xpert SA Assay (FDA approved for NASAL specimens in patients 72 years of age and older), is one component of a comprehensive surveillance program. It is not intended to diagnose infection nor to guide or monitor treatment. Performed at [facility], [place] [address], [place], [place] [address]     Radiology Studies: DG Chest  1 View  Result Date: 09/06/2022 CLINICAL DATA:  Surgery empyema follow up EXAM: CHEST  1 VIEW COMPARISON:  09/05/2022 FINDINGS: Alveolar opacity left hemithorax consistent with mid to lower lung pneumonia. Small left-sided parapneumonic effusion. Right lung clear. Normal pulmonary vasculature. No pneumothorax. IMPRESSION: Left-sided alveolar consolidation consistent with pneumonia and small parapneumonic effusion. Electronically Signed   By: Sammie Bench M.D.   On: 09/06/2022 10:34   DG CHEST PORT 1 VIEW  Result Date: 09/05/2022 CLINICAL DATA:  Pleural effusion EXAM: PORTABLE CHEST 1 VIEW COMPARISON:  X-ray 09/04/2022 FINDINGS: Stable left chest tube. Small left-sided pleural effusion similar to previous. Adjacent interstitial parenchymal lung opacity. Right lung is  grossly clear except for a tiny right effusion. No right-sided pneumothorax or consolidation. Stable cardiopericardial silhouette. Overlapping cardiac leads. Film is underinflated. IMPRESSION: No significant interval change when adjusting for technique Electronically Signed   By: Jill Side M.D.   On: 09/05/2022 12:29    Scheduled Meds:  buprenorphine-naloxone  1 tablet Sublingual BID   Chlorhexidine Gluconate Cloth  6 each Topical Q0600   enoxaparin (LOVENOX) injection  40 mg Subcutaneous Q24H   levofloxacin  750 mg Oral Daily   linezolid  600 mg Oral Q12H   mupirocin ointment  1 Application Nasal BID   nicotine  21 mg Transdermal Daily   pantoprazole (PROTONIX) IV  40 mg Intravenous Q12H   saccharomyces boulardii  250 mg Oral BID   sodium chloride flush  3 mL Intravenous Q12H   Continuous Infusions:  sodium chloride      LOS: 5 days   Raiford Noble, DO [facility] Available via Epic secure chat 7am-7pm After these hours, please refer to coverage provider listed on [email or web address] 09/06/2022, 6:27 PM

## 2022-09-06 NOTE — Progress Notes (Signed)
Pt refused oral contrast for CT scan.  MD notified.

## 2022-09-06 NOTE — Progress Notes (Signed)
Pt vomiting quite a bit this morning and continues to complain of stomach pain.  MD notified.

## 2022-09-06 NOTE — Consult Note (Signed)
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 and complicated by right empyema s/p vats cx mrsa s/p presumed 8 weeks treatment, admitted 3/17 for dyspnea, chest pain in setting left empyema  Patient admitted to [facility] 3/14 and had bcx 2 set negative 3/14 and left sided thoracentesis 3/15 that was negative He left [facility] and came back to [facility] for dyspnea/hypoxic required 3 liters o2  He was afebrile here and without leukocytosis He had a chest tube placed initially but then, undergone left sided VATs on 3/19 -- cx ngtd He has mrsa nares screen that was negative but grew mssa  He was started on vanc/cefepime  Tte done (technically difficult study but negative for obvious vegetation)    Plan: Suspect ivdu related occult bacteremia leading to empyema No other obvious metastatic focus Admission to [facility] bcx negative Would treat 14 days beyond the VATS procedure with linezolid 600 mg po bid/levofloxacin 750 mg po daily to finish 14 day course on 4/02 He should have follow up in id clinic 3-4 weeks after finishing abx to make sure there really was no other focus of infection He has follow up with me in [facility] clinic on 4/30 @ 930 Will sign off Discussed with primary team    I spent more than 80 minute reviewing data/chart, and coordinating care and >50% direct face to face time providing counseling/discussing diagnostics/treatment plan with patient   ------------------------------------------------ Principal Problem:   Empyema of left pleural space ([place]) Active Problems:   Polysubstance dependence including opioid type drug with complication, episodic abuse ([place])   Morbid obesity with BMI of 50.0-59.9, adult ([place])   Tobacco  dependence   Asthma, chronic    HPI: Derek Woods is a 32 y.o. 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 He appeared to have 6 weeks iv abx for mrsa om/epidural abscess/empyema by 6/14 and given 2 more weeks bactrim to finish 8 week course. It doesn't appear he had followed up. He reported he finished 2 weeks bactrim  He has no documents on care-everywhere beyond 08/2020, but reported did well until this recent admission  He is still using ivd  I called  hospital for culture reports --> Patient admitted to [facility] 3/14 and had bcx 2 set negative 3/14 and left sided thoracentesis 3/15 that was negative  He left [facility] and came back to [facility] for dyspnea/hypoxic required 3 liters o2  He was afebrile here and without leukocytosis He had undergone left sided VATs on 3/19 -- cx ngtd He has mrsa nares screen that was negative but grew mssa  He was started on vanc/cefepime  Tte done (technically difficult study but negative for obvious vegetation)  Chest tube removed 3/22 3/23 xray shows only small left effusion but some nearby parenchymal opacity  Nursing staff said he has abd pain and vomiting x1 this mornign but no complaint when I visited early pm. He did get zofran  No chest pain, dyspnea, focal pain anywhere, or  cough No f/c No diarrhea  History reviewed. No pertinent family history.  Social History   Tobacco Use   Smoking status: Every Day    Packs/day: 1.00    Years: 11.00    Additional pack years: 0.00    Total pack years: 11.00    Types: Cigarettes   Smokeless tobacco: Current   Tobacco comments:    unable to smoke; occasional use of oral tobacco  Substance Use Topics   Alcohol use: Yes    Comment: "hardly any"   Drug use: Yes    Types:  Heroin, Marijuana    Comment: occasional marijuana, daily heroin - last use 08/31/22    Allergies  Allergen Reactions   Penicillins Rash    Childhood allergy Tolerated cefazolin and cefepime before    Review of Systems: ROS All Other ROS was negative, except mentioned above   Past Medical History:  Diagnosis Date   Anxiety    Asthma    Hepatitis C    Morbid obesity with BMI of 50.0-59.9, adult ([facility])    Polysubstance dependence including opioid type drug with complication, episodic abuse ([facility])    Tobacco dependence        Scheduled Meds:  buprenorphine-naloxone  1 tablet Sublingual BID   Chlorhexidine Gluconate Cloth  6 each Topical Q0600   cloNIDine  0.1 mg Oral QAC breakfast   enoxaparin (LOVENOX) injection  40 mg Subcutaneous Q24H   mupirocin ointment  1 Application Nasal BID   nicotine  21 mg Transdermal Daily   pantoprazole (PROTONIX) IV  40 mg Intravenous Q12H   saccharomyces boulardii  250 mg Oral BID   sodium chloride flush  3 mL Intravenous Q12H   Continuous Infusions:  sodium chloride     ceFEPime (MAXIPIME) IV 2 g (09/05/22 0053)   vancomycin 1,250 mg (09/04/22 2050)   PRN Meds:.acetaminophen **OR** acetaminophen, albuterol, bisacodyl, calcium carbonate, guaiFENesin, hydrALAZINE, menthol-cetylpyridinium, ondansetron **OR** ondansetron (ZOFRAN) IV, polyethylene glycol, prochlorperazine   OBJECTIVE: Blood pressure (!) 148/102, pulse 89, temperature 99.5 F (37.5 C), temperature source Oral, resp. rate (!) 23, height 5\' 6"  (1.676 m), weight (!) 138.3 kg, SpO2 96 %.  Physical Exam  General/constitutional: no distress, pleasant, conversant; cooperative HEENT: Normocephalic, PER, Conj Clear, EOMI, Oropharynx clear Neck supple CV: rrr no mrg Lungs: clear to auscultation, normal respiratory effort Abd: Soft, Nontender Ext: no edema Skin: left chest wound dressing clean/dry; no surrounding cellulitis/fluctuance.  Neuro: nonfocal MSK: no peripheral joint  swelling/tenderness/warmth; back spines nontender     Lab Results Lab Results  Component Value Date   WBC 4.6 09/04/2022   HGB 9.7 (L) 09/04/2022   HCT 29.4 (L) 09/04/2022   MCV 92.5 09/04/2022   PLT 268 09/04/2022    Lab Results  Component Value Date   CREATININE 0.91 09/04/2022   BUN 12 09/04/2022   NA 139 09/04/2022   K 3.9 09/04/2022   CL 109 09/04/2022   CO2 23 09/04/2022    Lab Results  Component Value Date   ALT 18 09/04/2022   AST 25 09/04/2022   ALKPHOS 93 09/04/2022   BILITOT 0.4 09/04/2022      Microbiology: Recent Results (from the past 240 hour(s))  Blood Culture (routine x 2)     Status: None   Collection Time: 08/31/22 10:40 PM   Specimen: BLOOD  Result Value Ref Range Status   Specimen Description BLOOD LEFT ARM  Final   Special Requests   Final    BOTTLES DRAWN AEROBIC AND ANAEROBIC  Blood Culture adequate volume   Culture   Final    NO GROWTH 5 DAYS Performed at [facility], [place] [address] [place], [place] [address]    Report Status 09/05/2022 FINAL  Final  Resp panel by RT-PCR (RSV, Flu A&B, Covid) Anterior Nasal Swab     Status: None   Collection Time: 08/31/22 11:50 PM   Specimen: Anterior Nasal Swab  Result Value Ref Range Status   SARS Coronavirus 2 by RT PCR NEGATIVE NEGATIVE Final   Influenza A by PCR NEGATIVE NEGATIVE Final   Influenza B by PCR NEGATIVE NEGATIVE Final    Comment: (NOTE) The Xpert Xpress SARS-CoV-2/FLU/RSV plus assay is intended as an aid in the diagnosis of influenza from Nasopharyngeal swab specimens and should not be used as a sole basis for treatment. Nasal washings and aspirates are unacceptable for Xpert Xpress SARS-CoV-2/FLU/RSV testing.  Fact Sheet for Patients: EntrepreneurPulse.com.au  Fact Sheet for Healthcare Providers: IncredibleEmployment.be  This test is not yet approved or cleared by the [place] FDA and has been authorized for detection and/or  diagnosis of SARS-CoV-2 by FDA under an Emergency Use Authorization (EUA). This EUA will remain in effect (meaning this test can be used) for the duration of the COVID-19 declaration under Section 564(b)(1) of the Act, 21 U.S.C. section 360bbb-3(b)(1), unless the authorization is terminated or revoked.     Resp Syncytial Virus by PCR NEGATIVE NEGATIVE Final    Comment: (NOTE) Fact Sheet for Patients: EntrepreneurPulse.com.au  Fact Sheet for Healthcare Providers: IncredibleEmployment.be  This test is not yet approved or cleared by the [place] FDA and has been authorized for detection and/or diagnosis of SARS-CoV-2 by FDA under an Emergency Use Authorization (EUA). This EUA will remain in effect (meaning this test can be used) for the duration of the COVID-19 declaration under Section 564(b)(1) of the Act, 21 U.S.C. section 360bbb-3(b)(1), unless the authorization is terminated or revoked.  Performed at [facility], [place] [address] [place], [place] [address]   Blood Culture (routine x 2)     Status: None   Collection Time: 09/01/22 12:23 AM   Specimen: BLOOD RIGHT FOREARM  Result Value Ref Range Status   Specimen Description BLOOD RIGHT FOREARM  Final   Special Requests   Final    BOTTLES DRAWN AEROBIC AND ANAEROBIC Blood Culture adequate volume   Culture   Final    NO GROWTH 5 DAYS Performed at [facility], [place] [address], [place], [place] [address]    Report Status 09/06/2022 FINAL  Final  Aerobic/Anaerobic Culture w Gram Stain (surgical/deep wound)     Status: None (Preliminary result)   Collection Time: 09/02/22  3:38 PM   Specimen: PATH Other; Body Fluid  Result Value Ref Range Status   Specimen Description FLUID PLEURAL  Final   Special Requests NONE  Final   Gram Stain NO WBC SEEN NO ORGANISMS SEEN   Final   Culture   Final    NO GROWTH 3 DAYS NO ANAEROBES ISOLATED; CULTURE IN PROGRESS FOR 5 DAYS Performed  at [facility], [address], [place], [place] [address]    Report Status PENDING  Incomplete  Surgical pcr screen     Status: Abnormal   Collection Time: 09/02/22  4:03 PM   Specimen: Nasal Mucosa; Nasal Swab  Result Value Ref Range Status   MRSA, PCR NEGATIVE NEGATIVE Final   Staphylococcus aureus POSITIVE (A) NEGATIVE Final    Comment: (NOTE)  The Xpert SA Assay (FDA approved for NASAL specimens in patients 20 years of age and older), is one component of a comprehensive surveillance program. It is not intended to diagnose infection nor to guide or monitor treatment. Performed at [facility], [place] [address] [place], [place] [address]      Serology:    Imaging: If present, new imagings (plain films, ct scans, and mri) have been personally visualized and interpreted; radiology reports have been reviewed. Decision making incorporated into the Impression / Recommendations.  3/23 cxr FINDINGS: Alveolar opacity left hemithorax consistent with mid to lower lung pneumonia. Small left-sided parapneumonic effusion. Right lung clear. Normal pulmonary vasculature. No pneumothorax.   IMPRESSION: Left-sided alveolar consolidation consistent with pneumonia and small parapneumonic effusion.   3/17 xray chest FINDINGS: Near complete opacification of left thorax without significant change with minimal aerated lung at left apex. Right lung grossly clear. Partially obscured cardiomediastinal silhouette   IMPRESSION: Near complete opacification of left thorax with small aerated lung at left apex without significant change.  Jabier Mutton, [facility] [place] [phone] pager    09/06/2022, 10:53 AM

## 2022-09-06 NOTE — Progress Notes (Signed)
Pt refusing all Pt care and morning medications until 1200.  MD notified. Will try again at 1200.

## 2022-09-06 NOTE — Progress Notes (Signed)
[address]       [place] [address]             [phone]     4 Days Post-Op Procedure(s) (LRB): VIDEO ASSISTED THORACOSCOPY (VATS)/DECORTICATION (Left) Subjective: Some vomiting, denies SOB  Objective: Vital signs in last 24 hours: Temp:  [98.4 F (36.9 C)-99.5 F (37.5 C)] 99.5 F (37.5 C) (03/22 1941) Pulse Rate:  [89-97] 89 (03/22 1941) Cardiac Rhythm: Normal sinus rhythm (03/22 2025) Resp:  [20-23] 23 (03/22 1941) BP: (142-155)/(84-102) 148/102 (03/22 1941) SpO2:  [95 %-97 %] 96 % (03/22 1941)  Hemodynamic parameters for last 24 hours:    Intake/Output from previous day: 03/22 0701 - 03/23 0700 In: 240 [P.O.:240] Out: 1620 [Urine:1600; Chest Tube:20] Intake/Output this shift: No intake/output data recorded.  General appearance: alert, cooperative, fatigued, and no distress Heart: regular rate and rhythm Lungs: mildly dim left base Abdomen: obese, soft, non tender Extremities: warm , well perfused Wound: no signs of infection  Lab Results: Recent Labs    09/04/22 1103  WBC 4.6  HGB 9.7*  HCT 29.4*  PLT 268   BMET:  Recent Labs    09/04/22 1103  NA 139  K 3.9  CL 109  CO2 23  GLUCOSE 113*  BUN 12  CREATININE 0.91  CALCIUM 8.2*    PT/INR: No results for input(s): "LABPROT", "INR" in the last 72 hours. ABG    Component Value Date/Time   PHART 7.386 10/10/2020 0458   HCO3 28.7 (H) 10/10/2020 0458   TCO2 30 10/10/2020 0458   O2SAT 99.0 10/10/2020 0458   CBG (last 3)  No results for input(s): "GLUCAP" in the last 72 hours.  Meds Scheduled Meds:  buprenorphine-naloxone  1 tablet Sublingual BID   Chlorhexidine Gluconate Cloth  6 each Topical Q0600   cloNIDine  0.1 mg Oral QAC breakfast   enoxaparin (LOVENOX) injection  40 mg Subcutaneous Q24H   mupirocin ointment  1 Application Nasal BID   nicotine  21 mg Transdermal Daily   pantoprazole (PROTONIX) IV  40 mg Intravenous Q12H   saccharomyces boulardii  250 mg  Oral BID   sodium chloride flush  3 mL Intravenous Q12H   Continuous Infusions:  sodium chloride     ceFEPime (MAXIPIME) IV 2 g (09/05/22 0053)   vancomycin 1,250 mg (09/04/22 2050)   PRN Meds:.acetaminophen **OR** acetaminophen, albuterol, bisacodyl, calcium carbonate, dicyclomine, guaiFENesin, hydrALAZINE, hydrOXYzine, ketorolac, loperamide, menthol-cetylpyridinium, methocarbamol, ondansetron **OR** ondansetron (ZOFRAN) IV, polyethylene glycol, prochlorperazine  Xrays DG CHEST PORT 1 VIEW  Result Date: 09/05/2022 CLINICAL DATA:  Pleural effusion EXAM: PORTABLE CHEST 1 VIEW COMPARISON:  X-ray 09/04/2022 FINDINGS: Stable left chest tube. Small left-sided pleural effusion similar to previous. Adjacent interstitial parenchymal lung opacity. Right lung is grossly clear except for a tiny right effusion. No right-sided pneumothorax or consolidation. Stable cardiopericardial silhouette. Overlapping cardiac leads. Film is underinflated. IMPRESSION: No significant interval change when adjusting for technique Electronically Signed   By: Jill Side M.D.   On: 09/05/2022 12:29     Recent Results (from the past 240 hour(s))  Blood Culture (routine x 2)     Status: None   Collection Time: 08/31/22 10:40 PM   Specimen: BLOOD  Result Value Ref Range Status   Specimen Description BLOOD LEFT ARM  Final   Special Requests   Final    BOTTLES DRAWN AEROBIC AND ANAEROBIC Blood Culture adequate volume   Culture   Final    NO  GROWTH 5 DAYS Performed at [facility], [place] [address] [place], [place] [address]    Report Status 09/05/2022 FINAL  Final  Resp panel by RT-PCR (RSV, Flu A&B, Covid) Anterior Nasal Swab     Status: None   Collection Time: 08/31/22 11:50 PM   Specimen: Anterior Nasal Swab  Result Value Ref Range Status   SARS Coronavirus 2 by RT PCR NEGATIVE NEGATIVE Final   Influenza A by PCR NEGATIVE NEGATIVE Final   Influenza B by PCR NEGATIVE NEGATIVE Final    Comment: (NOTE) The  Xpert Xpress SARS-CoV-2/FLU/RSV plus assay is intended as an aid in the diagnosis of influenza from Nasopharyngeal swab specimens and should not be used as a sole basis for treatment. Nasal washings and aspirates are unacceptable for Xpert Xpress SARS-CoV-2/FLU/RSV testing.  Fact Sheet for Patients: EntrepreneurPulse.com.au  Fact Sheet for Healthcare Providers: IncredibleEmployment.be  This test is not yet approved or cleared by the [place] FDA and has been authorized for detection and/or diagnosis of SARS-CoV-2 by FDA under an Emergency Use Authorization (EUA). This EUA will remain in effect (meaning this test can be used) for the duration of the COVID-19 declaration under Section 564(b)(1) of the Act, 21 U.S.C. section 360bbb-3(b)(1), unless the authorization is terminated or revoked.     Resp Syncytial Virus by PCR NEGATIVE NEGATIVE Final    Comment: (NOTE) Fact Sheet for Patients: EntrepreneurPulse.com.au  Fact Sheet for Healthcare Providers: IncredibleEmployment.be  This test is not yet approved or cleared by the [place] FDA and has been authorized for detection and/or diagnosis of SARS-CoV-2 by FDA under an Emergency Use Authorization (EUA). This EUA will remain in effect (meaning this test can be used) for the duration of the COVID-19 declaration under Section 564(b)(1) of the Act, 21 U.S.C. section 360bbb-3(b)(1), unless the authorization is terminated or revoked.  Performed at [facility], [place] [address], [place], [place] [address]   Blood Culture (routine x 2)     Status: None (Preliminary result)   Collection Time: 09/01/22 12:23 AM   Specimen: BLOOD RIGHT FOREARM  Result Value Ref Range Status   Specimen Description BLOOD RIGHT FOREARM  Final   Special Requests   Final    BOTTLES DRAWN AEROBIC AND ANAEROBIC Blood Culture adequate volume   Culture   Final    NO GROWTH 4  DAYS Performed at [facility], [place] [address] [place], [place] [address]    Report Status PENDING  Incomplete  Aerobic/Anaerobic Culture w Gram Stain (surgical/deep wound)     Status: None (Preliminary result)   Collection Time: 09/02/22  3:38 PM   Specimen: PATH Other; Body Fluid  Result Value Ref Range Status   Specimen Description FLUID PLEURAL  Final   Special Requests NONE  Final   Gram Stain NO WBC SEEN NO ORGANISMS SEEN   Final   Culture   Final    NO GROWTH 3 DAYS NO ANAEROBES ISOLATED; CULTURE IN PROGRESS FOR 5 DAYS Performed at [facility], [address] [place], [place] [address]    Report Status PENDING  Incomplete  Surgical pcr screen     Status: Abnormal   Collection Time: 09/02/22  4:03 PM   Specimen: Nasal Mucosa; Nasal Swab  Result Value Ref Range Status   MRSA, PCR NEGATIVE NEGATIVE Final   Staphylococcus aureus POSITIVE (A) NEGATIVE Final    Comment: (NOTE) The Xpert SA Assay (FDA approved for NASAL specimens in patients 22 years  of age and older), is one component of a comprehensive surveillance program. It is not intended to diagnose infection nor to guide or monitor treatment. Performed at [facility], [place] [address] [place], [place] [address]     Assessment/Plan: S/P Procedure(s) (LRB): VIDEO ASSISTED THORACOSCOPY (VATS)/DECORTICATION (Left)  1 Tmax 99.5, s BP 140's-150's, SR 2 O2 sats good on RA 3 CT out yesterday 4 will get CXR, no new labs, CX- NGSF 5 med management/abx  per primary      LOS: 5 days    John Giovanni PA-C Pager [phone] 09/06/2022

## 2022-09-06 NOTE — Progress Notes (Signed)
Pt refused vitals, NS fluid, and IV antibiotics this morning.  MD notified.

## 2022-09-06 NOTE — Progress Notes (Signed)
Refused vitals 

## 2022-09-07 ENCOUNTER — Inpatient Hospital Stay (HOSPITAL_COMMUNITY): Payer: Medicaid Other

## 2022-09-07 DIAGNOSIS — J869 Pyothorax without fistula: Secondary | ICD-10-CM | POA: Diagnosis not present

## 2022-09-07 DIAGNOSIS — F1920 Other psychoactive substance dependence, uncomplicated: Secondary | ICD-10-CM | POA: Diagnosis not present

## 2022-09-07 DIAGNOSIS — J45909 Unspecified asthma, uncomplicated: Secondary | ICD-10-CM | POA: Diagnosis not present

## 2022-09-07 LAB — AEROBIC/ANAEROBIC CULTURE W GRAM STAIN (SURGICAL/DEEP WOUND)
Culture: NO GROWTH
Gram Stain: NONE SEEN

## 2022-09-07 MED ORDER — ALUM & MAG HYDROXIDE-SIMETH 200-200-20 MG/5ML PO SUSP
30.0000 mL | Freq: Four times a day (QID) | ORAL | Status: DC | PRN
  Filled 2022-09-07: qty 30

## 2022-09-07 MED ORDER — PANTOPRAZOLE SODIUM 40 MG PO TBEC
40.0000 mg | DELAYED_RELEASE_TABLET | Freq: Two times a day (BID) | ORAL | Status: DC
  Administered 2022-09-07 – 2022-09-08 (×3): 40 mg via ORAL
  Filled 2022-09-07 (×3): qty 1

## 2022-09-07 NOTE — Progress Notes (Signed)
Pt refused morning vitals and labs.  MD notified.

## 2022-09-07 NOTE — Progress Notes (Signed)
PROGRESS NOTE    Derek Woods  [id number] DOB: 08-Apr-1991 DOA: 08/31/2022 PCP: Pcp, No   Brief Narrative:  The patient is a 32 year old morbidly obese Caucasian male with past medical history significant for but limited to IV drug abuse as well as other comorbidities who presents with shortness of breath.  He was recently at [facility] and they told him he had pneumonia for the past 5 months due to the loculated effusions and probable empyema.  He underwent a thoracentesis on 08/29/2022.  He left [facility] he has medical advice given that he is feeling better and came back to [name] ED.  He was noted to be hypoxic 3 L chest x-ray again showed complete opacification of left lung and he was placed on broad-spectrum antibiotics and received multiple fluid boluses.  CT surgery was consulted and the patient for a VATS on 09/01/2020.  Currently he is postoperative day 3 and after repeat chest x-ray today his chest tube was removed.  The case was discussed with infectious diseases who was confirmed for staphylococcal infection so they recommend obtaining a TEE and obtaining medical records from [facility].  ID is to see the patient in the morning as they feel that he may need prolonged IV antibiotics given that he is a drug user but currently he is refused to consent for TEE.  Currently refusing his antibiotics as well.   ID has now been consulted and they are recommending continuing p.o. antibiotics and changed him off the IV antibiotics given his refusal.  Will get a CT scan of his abdomen pelvis given his nausea and vomiting.  Been refusing quite a bit of interventions while he has been hospitalized including antibiotics, blood draws as well as IV fluids.  Awaiting clearance from cardiothoracic surgery.  Continues to refuse interventions.  Discussed with him about possible Suboxone clinic and patient states that he will just continue to use drugs so will not discharge home on any Suboxone  unless he is willing to change his lifestyle.  Assessment and Plan:  Empyema with Acute Respiratory Failure with hypoxia Left Pleural Effusion -S/p right-sided VATS on 10/09/2020 as well and is now s/p VATS on 09/02/2022 -Continue cefepime and vancomycin day #4 today. -Continue supportive care and further care per Cardiothoracic surgery -IVF Hydration now to stop SpO2: 93 % O2 Flow Rate (L/min): 3 L/min -Continuous pulse oximetry maintain O2 saturation greater 90% -Continue supplemental oxygen via nasal cannula wean O2 as tolerated -Continue supportive care and nebs as below -Continue guaifenesin 600 mg p.o. twice daily as needed cough and will add incentive spirometer as well as flutter valve -Continue with empiric antibiotics with IV cefepime and vancomycin and case was discussed with ID who recommends obtaining records from [facility] and likely long-term antibiotics given IV drug use; due to concern for staphylococcal infection recommended TEE which is going to be arranged for Tuesday however patient has refused to consent for it -Currently patient is refusing antibiotics due to abdominal discomfort and refused again today but is agreeable to try oral; -Chest tube has been removed -Chest x-ray done on 09/06/2022 and showed "Alveolar opacity left hemithorax consistent with mid to lower lung pneumonia. Small left-sided parapneumonic effusion. Right lung clear. Normal pulmonary vasculature. No pneumothorax." -Further care per cardiothoracic surgery and awaiting their clearance to try and a discharge the patient potentially  -Infectious diseases was consulted and they feel that his IV drug use has caused occult bacteremia leading to empyema with no other obvious metastatic  focus Blood cultures have been negative -ID has now changed his antibiotics to p.o. Linezolid 600 mg po twice daily and Levofloxacin 750 mg p.o. daily to finish a 14-day course on 09/16/2022 -Patient to have ID follow-up in 3 to 4  weeks and has a follow-up on 10/14/2022 at 930   Noncompliance with Treatment -Patient is signed out [facility] multiple times now refusing blood work, interventions IV fluids and refusing IV antibiotics    Heroin Dependence with ongoing IV use -Presently on clonidine detox and COWS protocol and is taking 0.1 mg of clonidine every morning and at bedtime for 4 doses and 0.1 g p.o. daily for 2 doses starting 22 March -Suboxone ordered and will continue but after discussion with the patient today and he states that he does continue to do IV drugs will consider going to Suboxone clinic.   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 every 8 as needed for muscle spasms as well as p.o. acetaminophen 650 mg every 6 as needed for mild pain or fever   Asthma -C/w As needed inhaled bronchodilators albuterol 2.5 mg neb every 2 as needed for wheezing   Tobacco Use and Abuse -Smoking cessation counseling to be provided -C/w Nicotine patch 21 mg TD   Heartburn -Provided with PRN TUMS overnight -Have added PPI -Continues to have abdominal discomfort however agreed to have the CT scan but does not want any further intervention and IV is lost and so continues to refuse medications and IV fluids   Nausea and Vomiting -Obtain CT scan of the abdomen pelvis given his nausea vomiting and it showed " Stomach is within normal limits. Appendix appears normal. No evidence of bowel wall thickening, distention, or inflammatory changes. Diffuse colonic diverticulosis." -Continue supportive care -Wanted to start IV fluids at 100 MLS per hour but patient refused -Continue supportive care and antiemetics with p.o./IV ondansetron as well as IV Compazine as  needed   Normocytic Anemia with mild acute blood loss anemia -Hgb/Hct Trend: Recent Labs  Lab 08/31/22 2230 09/02/22 0431 09/03/22 0252 09/04/22 1103  HGB 12.7* 10.4* 10.6* 9.7*  HCT 38.1* 31.5* 31.0* 29.4*  MCV 93.4 92.9 91.2 92.5  -Check Anemia Panel but has been refusing labs As expected blood loss -Continue to Monitor for S/Sx of Bleeding; No overt bleeding noted -Patient has been refusing blood draws   Hypophosphatemia -Mild. Phos Level was 2.4 on last check -Replete with po K Phos 500 mg x1 -Continue to Monitor and Replete as Necessary -Refuses blood work last few days   Hypoalbuminemia -Patient's Albumin Trend: Recent Labs  Lab 08/31/22 2230 09/04/22 1103  ALBUMIN 2.7* 2.0*  -Continue to Monitor and Trend and repeat CMP in the AM   Morbid Obesity -Complicates overall prognosis and care -Estimated body mass index is 49.23 kg/m as calculated from the following:   Height as of this encounter: 5\' 6"  (1.676 m).   Weight as of this encounter: 138.3 kg.  -Weight Loss and Dietary Counseling given   DVT prophylaxis: enoxaparin (LOVENOX) injection 40 mg Start: 09/03/22 1000    Code Status: Full Code Family Communication: No family currently at bedside  Disposition Plan:  Level of care: Progressive Status is: Inpatient Remains inpatient appropriate because: Needs further cardiothoracic surgery clearance  Consultants:  Cardiothoracic surgery Infectious Diseases  Procedures:  As delineated as above  Antimicrobials:  Anti-infectives (From admission, onward)    Start     Dose/Rate Route Frequency Ordered Stop   09/06/22 1200  linezolid (ZYVOX) tablet 600 mg        600 mg Oral Every 12 hours 09/06/22 1108 09/15/22 0959   09/06/22 1200  levofloxacin (LEVAQUIN) tablet 750 mg        750 mg Oral Daily 09/06/22 1108 09/15/22 0959   09/01/22 1000  ceFEPIme (MAXIPIME) 2 g in sodium chloride 0.9 % 100 mL IVPB  Status:  Discontinued        2 g 200 mL/hr over 30  Minutes Intravenous Every 8 hours 09/01/22 0900 09/06/22 1108   09/01/22 1000  vancomycin (VANCOCIN) IVPB 1000 mg/200 mL premix  Status:  Discontinued        1,000 mg 200 mL/hr over 60 Minutes Intravenous Every 12 hours 09/01/22 0906 09/01/22 0907   09/01/22 1000  vancomycin (VANCOREADY) IVPB 1250 mg/250 mL  Status:  Discontinued        1,250 mg 166.7 mL/hr over 90 Minutes Intravenous Every 12 hours 09/01/22 0907 09/06/22 1108   09/01/22 0900  ceFEPIme (MAXIPIME) 2 g in sodium chloride 0.9 % 100 mL IVPB  Status:  Discontinued        2 g 200 mL/hr over 30 Minutes Intravenous  Once 09/01/22 0852 09/01/22 0900   08/31/22 2300  vancomycin (VANCOCIN) IVPB 1000 mg/200 mL premix  Status:  Discontinued        1,000 mg 200 mL/hr over 60 Minutes Intravenous  Once 08/31/22 2251 08/31/22 2257   08/31/22 2300  ceFEPIme (MAXIPIME) 2 g in sodium chloride 0.9 % 100 mL IVPB        2 g 200 mL/hr over 30 Minutes Intravenous  Once 08/31/22 2251 09/01/22 0047   08/31/22 2300  vancomycin (VANCOCIN) IVPB 1000 mg/200 mL premix       See Hyperspace for full Linked Orders Report.   1,000 mg 200 mL/hr over 60 Minutes Intravenous  Once 08/31/22 2257 09/01/22 0156   08/31/22 2300  vancomycin (VANCOCIN) IVPB 1000 mg/200 mL premix       See Hyperspace for full Linked Orders Report.   1,000 mg 200 mL/hr over 60 Minutes Intravenous  Once 08/31/22 2257 09/01/22 0128       Subjective: Seen and examined at bedside and is sitting up with his head down on the table.  Complaining of some abdominal discomfort and complaining about his IV and states that is not working anymore.  Not wanting another IV inserted.  Continues to refuse interventions of care  Objective: Vitals:   09/05/22 1819 09/05/22 1941 09/06/22 1404 09/07/22 1233  BP:  (!) 148/102 (!) 153/95 (!) 156/86  Pulse:  89 88 96  Resp: 20 (!) 23 20 18   Temp:   99 F (37.2 C) 98.9 F (37.2 C)  TempSrc:  Oral Oral Oral  SpO2:  96% 93% 95%  Weight:       Height:       No intake or output data in the 24 hours ending 09/07/22 1706 Filed Weights   08/31/22 2221  Weight: (!) 138.3 kg   Examination: Physical Exam:  Constitutional: WN/WD morbidly obese Caucasian male who appears slightly uncomfortable Respiratory: Diminished to auscultation bilaterally with coarse breath sounds worse in the left compared to the right with no appreciable wheezing, rales.  Does have some rhonchi noted.  Unlabored  breathing and not wearing supplemental oxygen nasal cannula Cardiovascular: RRR, no murmurs / rubs / gallops. S1 and S2 auscultated. No extremity edema Abdomen: Soft, non-tender to palpate, distended secondary to body habitus. Bowel sounds positive.  GU: Deferred. Musculoskeletal: No clubbing / cyanosis of digits/nails. No joint deformity upper and lower extremities Skin: No rashes, lesions, ulcers on the skin evaluation Neurologic: CN 2-12 grossly intact with no focal deficits. Romberg sign and cerebellar reflexes not assessed.  Psychiatric: Normal judgment and insight. Alert and oriented x 3.  He remains withdrawn but has a normal affect  Data Reviewed: I have personally reviewed following labs and imaging studies  CBC: Recent Labs  Lab 08/31/22 2230 09/02/22 0431 09/03/22 0252 09/04/22 1103  WBC 6.8 5.3 7.0 4.6  NEUTROABS 4.6  --   --  2.8  HGB 12.7* 10.4* 10.6* 9.7*  HCT 38.1* 31.5* 31.0* 29.4*  MCV 93.4 92.9 91.2 92.5  PLT 320 243 293 [id number]   Basic Metabolic Panel: Recent Labs  Lab 08/31/22 2230 09/02/22 0431 09/03/22 0252 09/04/22 1103  NA 134* 138 136 139  K 4.0 4.1 4.5 3.9  CL 101 104 105 109  CO2 23 27 25 23   GLUCOSE 112* 108* 146* 113*  BUN 13 11 11 12   CREATININE 0.96 0.85 0.81 0.91  CALCIUM 8.5* 8.4* 8.3* 8.2*  MG  --   --   --  1.9  PHOS  --   --   --  2.4*   GFR: Estimated Creatinine Clearance: 152.9 mL/min (by C-G formula based on SCr of 0.91 mg/dL). Liver Function Tests: Recent Labs  Lab 08/31/22 2230  09/04/22 1103  AST 33 25  ALT 25 18  ALKPHOS 155* 93  BILITOT 0.3 0.4  PROT 7.4 5.6*  ALBUMIN 2.7* 2.0*   No results for input(s): "LIPASE", "AMYLASE" in the last 168 hours. No results for input(s): "AMMONIA" in the last 168 hours. Coagulation Profile: Recent Labs  Lab 08/31/22 2230  INR 1.1   Cardiac Enzymes: No results for input(s): "CKTOTAL", "CKMB", "CKMBINDEX", "TROPONINI" in the last 168 hours. BNP (last 3 results) No results for input(s): "PROBNP" in the last 8760 hours. HbA1C: No results for input(s): "HGBA1C" in the last 72 hours. CBG: Recent Labs  Lab 09/02/22 1848  GLUCAP 157*   Lipid Profile: No results for input(s): "CHOL", "HDL", "LDLCALC", "TRIG", "CHOLHDL", "LDLDIRECT" in the last 72 hours. Thyroid Function Tests: No results for input(s): "TSH", "T4TOTAL", "FREET4", "T3FREE", "THYROIDAB" in the last 72 hours. Anemia Panel: No results for input(s): "VITAMINB12", "FOLATE", "FERRITIN", "TIBC", "IRON", "RETICCTPCT" in the last 72 hours. Sepsis Labs: Recent Labs  Lab 08/31/22 2230 09/01/22 0127  LATICACIDVEN 1.8 1.1    Recent Results (from the past 240 hour(s))  Blood Culture (routine x 2)     Status: None   Collection Time: 08/31/22 10:40 PM   Specimen: BLOOD  Result Value Ref Range Status   Specimen Description BLOOD LEFT ARM  Final   Special Requests   Final    BOTTLES DRAWN AEROBIC AND ANAEROBIC Blood Culture adequate volume   Culture   Final    NO GROWTH 5 DAYS Performed at [facility], [address] [place], [place] [address]    Report Status 09/05/2022 FINAL  Final  Resp panel by RT-PCR (RSV, Flu A&B, Covid) Anterior Nasal Swab     Status: None   Collection Time: 08/31/22 11:50 PM   Specimen: Anterior Nasal Swab  Result Value Ref Range Status  SARS Coronavirus 2 by RT PCR NEGATIVE NEGATIVE Final   Influenza A by PCR NEGATIVE NEGATIVE Final   Influenza B by PCR NEGATIVE NEGATIVE Final    Comment: (NOTE) The Xpert Xpress  SARS-CoV-2/FLU/RSV plus assay is intended as an aid in the diagnosis of influenza from Nasopharyngeal swab specimens and should not be used as a sole basis for treatment. Nasal washings and aspirates are unacceptable for Xpert Xpress SARS-CoV-2/FLU/RSV testing.  Fact Sheet for Patients: EntrepreneurPulse.com.au  Fact Sheet for Healthcare Providers: IncredibleEmployment.be  This test is not yet approved or cleared by the [place] FDA and has been authorized for detection and/or diagnosis of SARS-CoV-2 by FDA under an Emergency Use Authorization (EUA). This EUA will remain in effect (meaning this test can be used) for the duration of the COVID-19 declaration under Section 564(b)(1) of the Act, 21 U.S.C. section 360bbb-3(b)(1), unless the authorization is terminated or revoked.     Resp Syncytial Virus by PCR NEGATIVE NEGATIVE Final    Comment: (NOTE) Fact Sheet for Patients: EntrepreneurPulse.com.au  Fact Sheet for Healthcare Providers: IncredibleEmployment.be  This test is not yet approved or cleared by the [place] FDA and has been authorized for detection and/or diagnosis of SARS-CoV-2 by FDA under an Emergency Use Authorization (EUA). This EUA will remain in effect (meaning this test can be used) for the duration of the COVID-19 declaration under Section 564(b)(1) of the Act, 21 U.S.C. section 360bbb-3(b)(1), unless the authorization is terminated or revoked.  Performed at [facility], [place] [address], [place], [place] [address]   Blood Culture (routine x 2)     Status: None   Collection Time: 09/01/22 12:23 AM   Specimen: BLOOD RIGHT FOREARM  Result Value Ref Range Status   Specimen Description BLOOD RIGHT FOREARM  Final   Special Requests   Final    BOTTLES DRAWN AEROBIC AND ANAEROBIC Blood Culture adequate volume   Culture   Final    NO GROWTH 5 DAYS Performed at [facility], [place] [address] [place], [place] [address]    Report Status 09/06/2022 FINAL  Final  Aerobic/Anaerobic Culture w Gram Stain (surgical/deep wound)     Status: None (Preliminary result)   Collection Time: 09/02/22  3:38 PM   Specimen: PATH Other; Body Fluid  Result Value Ref Range Status   Specimen Description FLUID PLEURAL  Final   Special Requests NONE  Final   Gram Stain NO WBC SEEN NO ORGANISMS SEEN   Final   Culture   Final    NO GROWTH 5 DAYS NO ANAEROBES ISOLATED; CULTURE IN PROGRESS FOR 5 DAYS Performed at [facility], [address] [place], [place] [address]    Report Status PENDING  Incomplete  Acid Fast Smear (AFB)     Status: None   Collection Time: 09/02/22  3:38 PM   Specimen: PATH Other; Body Fluid  Result Value Ref Range Status   AFB Specimen Processing Concentration  Final   Acid Fast Smear Negative  Final    Comment: (NOTE) Performed At: [facility] [place], [place] [id number] Rush Farmer MD [id number]    Source (AFB) FLUID  Final    Comment: PLEURAL Performed at [facility], [place] [address], [place], [place] [address]   Surgical pcr screen     Status: Abnormal   Collection Time: 09/02/22  4:03 PM   Specimen: Nasal Mucosa; Nasal Swab  Result Value Ref Range Status   MRSA,  PCR NEGATIVE NEGATIVE Final   Staphylococcus aureus POSITIVE (A) NEGATIVE Final    Comment: (NOTE) The Xpert SA Assay (FDA approved for NASAL specimens in patients 25 years of age and older), is one component of a comprehensive surveillance program. It is not intended to diagnose infection nor to guide or monitor treatment. Performed at [facility], [place] [address] [place], [place] [address]     Radiology Studies: CT ABDOMEN PELVIS WO CONTRAST  Result Date: 09/06/2022 CLINICAL DATA:  Abdominal pain.  Recent surgery for empyema. EXAM: CT ABDOMEN AND PELVIS WITHOUT CONTRAST TECHNIQUE: Multidetector CT imaging of the abdomen  and pelvis was performed following the standard protocol without IV contrast. RADIATION DOSE REDUCTION: This exam was performed according to the departmental dose-optimization program which includes automated exposure control, adjustment of the mA and/or kV according to patient size and/or use of iterative reconstruction technique. COMPARISON:  08/10/2022. FINDINGS: Lower chest: Left-sided pleural effusion with pockets of extraluminal air consistent with empyema and air in the soft tissues of the left lower chest wall consistent with recent procedure. Hepatobiliary: No focal liver abnormality is seen. No gallstones, gallbladder wall thickening, or biliary dilatation. Pancreas: Unremarkable. No pancreatic ductal dilatation or surrounding inflammatory changes. Spleen: Enlarged, 16 cm. Adrenals/Urinary Tract: Adrenal glands are unremarkable. Kidneys are normal, without renal calculi, focal lesion, or hydronephrosis. Bladder is unremarkable. Stomach/Bowel: Stomach is within normal limits. Appendix appears normal. No evidence of bowel wall thickening, distention, or inflammatory changes. Diffuse colonic diverticulosis. Vascular/Lymphatic: No significant vascular findings are present. No enlarged abdominal or pelvic lymph nodes. Reproductive: Prostate is unremarkable. Other: No abdominal wall hernia or abnormality. No abdominopelvic ascites. 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 IMPRESSION: 1. Left-sided and pain with postop changes. 2. Splenomegaly. 3. Diverticulosis. Electronically Signed   By: Sammie Bench M.D.   On: 09/06/2022 20:31   DG Chest 1 View  Result Date: 09/06/2022 CLINICAL DATA:  Surgery empyema follow up EXAM: CHEST  1 VIEW COMPARISON:  09/05/2022 FINDINGS: Alveolar opacity left hemithorax consistent with mid to lower lung pneumonia. Small left-sided parapneumonic effusion. Right lung clear. Normal pulmonary vasculature. No  pneumothorax. IMPRESSION: Left-sided alveolar consolidation consistent with pneumonia and small parapneumonic effusion. Electronically Signed   By: Sammie Bench M.D.   On: 09/06/2022 10:34    Scheduled Meds:  buprenorphine-naloxone  1 tablet Sublingual BID   enoxaparin (LOVENOX) injection  40 mg Subcutaneous Q24H   levofloxacin  750 mg Oral Daily   linezolid  600 mg Oral Q12H   nicotine  21 mg Transdermal Daily   pantoprazole  40 mg Oral BID   saccharomyces boulardii  250 mg Oral BID   sodium chloride flush  3 mL Intravenous Q12H   Continuous Infusions:  sodium chloride      LOS: 6 days   Raiford Noble, DO [facility] Available via Epic secure chat 7am-7pm After these hours, please refer to coverage provider listed on [email or web address] 09/07/2022, 5:06 PM

## 2022-09-07 NOTE — Progress Notes (Signed)
Mobility Specialist Progress Note:   09/07/22 1259  Mobility  Activity Ambulated with assistance in hallway  Level of Assistance Independent  Assistive Device None  Distance Ambulated (ft) 300 ft  Activity Response Tolerated well  $Mobility charge 1 Mobility   Pt in bed willing to participate in mobility. No complaints of pain. Left EOB with call bell in reach and all needs met.   Gareth Eagle Tessla Spurling Mobility Specialist Please contact via [employer] or  Rehab Office at [phone]

## 2022-09-07 NOTE — Progress Notes (Signed)
Nurse requested Mobility Specialist to perform oxygen saturation test with pt which includes removing pt from oxygen both at rest and while ambulating.  Below are the results from that testing.     Patient Saturations on Room Air at Rest = spO2 97%  Patient Saturations on Room Air while Ambulating = sp02 94% .  Reported results to nurse.   

## 2022-09-07 NOTE — Progress Notes (Signed)
Pt continues to refuse Lovenox.  MD notified.

## 2022-09-08 ENCOUNTER — Inpatient Hospital Stay (HOSPITAL_COMMUNITY): Payer: Medicaid Other

## 2022-09-08 ENCOUNTER — Other Ambulatory Visit (HOSPITAL_COMMUNITY): Payer: Self-pay

## 2022-09-08 DIAGNOSIS — J45909 Unspecified asthma, uncomplicated: Secondary | ICD-10-CM | POA: Diagnosis not present

## 2022-09-08 DIAGNOSIS — J869 Pyothorax without fistula: Secondary | ICD-10-CM | POA: Diagnosis not present

## 2022-09-08 DIAGNOSIS — F1920 Other psychoactive substance dependence, uncomplicated: Secondary | ICD-10-CM | POA: Diagnosis not present

## 2022-09-08 LAB — CBC WITH DIFFERENTIAL/PLATELET
Abs Immature Granulocytes: 0.03 10*3/uL (ref 0.00–0.07)
Basophils Absolute: 0 10*3/uL (ref 0.0–0.1)
Basophils Relative: 1 %
Eosinophils Absolute: 0.2 10*3/uL (ref 0.0–0.5)
Eosinophils Relative: 3 %
HCT: 36.5 % — ABNORMAL LOW (ref 39.0–52.0)
Hemoglobin: 12 g/dL — ABNORMAL LOW (ref 13.0–17.0)
Immature Granulocytes: 1 %
Lymphocytes Relative: 28 %
Lymphs Abs: 1.8 10*3/uL (ref 0.7–4.0)
MCH: 30.2 pg (ref 26.0–34.0)
MCHC: 32.9 g/dL (ref 30.0–36.0)
MCV: 91.7 fL (ref 80.0–100.0)
Monocytes Absolute: 0.2 10*3/uL (ref 0.1–1.0)
Monocytes Relative: 3 %
Neutro Abs: 4.3 10*3/uL (ref 1.7–7.7)
Neutrophils Relative %: 64 %
Platelets: 321 10*3/uL (ref 150–400)
RBC: 3.98 MIL/uL — ABNORMAL LOW (ref 4.22–5.81)
RDW: 13.5 % (ref 11.5–15.5)
WBC: 6.6 10*3/uL (ref 4.0–10.5)
nRBC: 0 % (ref 0.0–0.2)

## 2022-09-08 LAB — COMPREHENSIVE METABOLIC PANEL
ALT: 17 U/L (ref 0–44)
AST: 25 U/L (ref 15–41)
Albumin: 2.5 g/dL — ABNORMAL LOW (ref 3.5–5.0)
Alkaline Phosphatase: 119 U/L (ref 38–126)
Anion gap: 13 (ref 5–15)
BUN: 11 mg/dL (ref 6–20)
CO2: 26 mmol/L (ref 22–32)
Calcium: 8.7 mg/dL — ABNORMAL LOW (ref 8.9–10.3)
Chloride: 96 mmol/L — ABNORMAL LOW (ref 98–111)
Creatinine, Ser: 1 mg/dL (ref 0.61–1.24)
GFR, Estimated: >60 mL/min (ref >60)
Glucose, Bld: 135 mg/dL — ABNORMAL HIGH (ref 70–99)
Potassium: 4.5 mmol/L (ref 3.5–5.1)
Sodium: 135 mmol/L (ref 135–145)
Total Bilirubin: 0.4 mg/dL (ref 0.3–1.2)
Total Protein: 6.9 g/dL (ref 6.5–8.1)

## 2022-09-08 LAB — PHOSPHORUS: Phosphorus: 3.5 mg/dL (ref 2.5–4.6)

## 2022-09-08 LAB — MAGNESIUM: Magnesium: 1.8 mg/dL (ref 1.7–2.4)

## 2022-09-08 SURGERY — ECHOCARDIOGRAM, TRANSESOPHAGEAL
Anesthesia: Monitor Anesthesia Care

## 2022-09-08 MED ORDER — GUAIFENESIN ER 600 MG PO TB12
600.0000 mg | ORAL_TABLET | Freq: Two times a day (BID) | ORAL | 0 refills | Status: AC
  Filled 2022-09-08: qty 10, 5d supply, fill #0

## 2022-09-08 MED ORDER — METHOCARBAMOL 500 MG PO TABS
500.0000 mg | ORAL_TABLET | Freq: Three times a day (TID) | ORAL | 0 refills | Status: AC | PRN
  Filled 2022-09-08: qty 10, 4d supply, fill #0

## 2022-09-08 MED ORDER — PANTOPRAZOLE SODIUM 40 MG PO TBEC
40.0000 mg | DELAYED_RELEASE_TABLET | Freq: Every day | ORAL | 0 refills | Status: AC
  Filled 2022-09-08: qty 30, 30d supply, fill #0

## 2022-09-08 MED ORDER — POLYETHYLENE GLYCOL 3350 17 GM/SCOOP PO POWD
17.0000 g | Freq: Every day | ORAL | 0 refills | Status: AC | PRN
  Filled 2022-09-08: qty 238, 14d supply, fill #0

## 2022-09-08 MED ORDER — LINEZOLID 600 MG PO TABS
600.0000 mg | ORAL_TABLET | Freq: Two times a day (BID) | ORAL | 0 refills | Status: AC
  Filled 2022-09-08: qty 14, 7d supply, fill #0

## 2022-09-08 MED ORDER — BUPRENORPHINE HCL-NALOXONE HCL 8-2 MG SL SUBL
1.0000 | SUBLINGUAL_TABLET | Freq: Two times a day (BID) | SUBLINGUAL | 0 refills | Status: AC
  Filled 2022-09-08: qty 10, 5d supply, fill #0

## 2022-09-08 MED ORDER — NICOTINE 21 MG/24HR TD PT24
21.0000 mg | MEDICATED_PATCH | Freq: Every day | TRANSDERMAL | 0 refills | Status: AC
  Filled 2022-09-08: qty 28, 28d supply, fill #0

## 2022-09-08 MED ORDER — ACETAMINOPHEN 325 MG PO TABS
650.0000 mg | ORAL_TABLET | Freq: Four times a day (QID) | ORAL | 0 refills | Status: AC | PRN
  Filled 2022-09-08: qty 20, 3d supply, fill #0

## 2022-09-08 MED ORDER — ALBUTEROL SULFATE HFA 108 (90 BASE) MCG/ACT IN AERS
2.0000 | INHALATION_SPRAY | Freq: Four times a day (QID) | RESPIRATORY_TRACT | 0 refills | Status: AC | PRN
  Filled 2022-09-08: qty 18, 30d supply, fill #0

## 2022-09-08 MED ORDER — LEVOFLOXACIN 750 MG PO TABS
750.0000 mg | ORAL_TABLET | Freq: Every day | ORAL | 0 refills | Status: AC
  Filled 2022-09-08: qty 7, 7d supply, fill #0

## 2022-09-08 MED ORDER — ONDANSETRON HCL 4 MG PO TABS
4.0000 mg | ORAL_TABLET | Freq: Four times a day (QID) | ORAL | 0 refills | Status: AC | PRN
  Filled 2022-09-08: qty 20, 5d supply, fill #0

## 2022-09-08 MED ORDER — SACCHAROMYCES BOULARDII 250 MG PO CAPS
250.0000 mg | ORAL_CAPSULE | Freq: Two times a day (BID) | ORAL | 0 refills | Status: AC
  Filled 2022-09-08: qty 20, 10d supply, fill #0

## 2022-09-08 NOTE — Progress Notes (Signed)
[address]       [place] [address]             [phone]     6 Days Post-Op Procedure(s) (LRB): VIDEO ASSISTED THORACOSCOPY (VATS)/DECORTICATION (Left) Subjective: Feels much better today, able to breath deeply without any chest discomfort. Denies shortness of breath on RA.    Objective: Vital signs in last 24 hours: Temp:  [99 F (37.2 C)-99.3 F (37.4 C)] 99.3 F (37.4 C) (03/25 1221) Pulse Rate:  [94-101] 101 (03/25 1221) Cardiac Rhythm: Sinus tachycardia (03/25 0803) Resp:  [18] 18 (03/25 1221) BP: (123-134)/(82-86) 123/86 (03/25 1221) SpO2:  [95 %-96 %] 96 % (03/25 1221)  Hemodynamic parameters for last 24 hours:    Intake/Output from previous day: No intake/output data recorded. Intake/Output this shift: Total I/O In: 720 [P.O.:720] Out: -   General appearance: alert, cooperative, fatigued, and no distress Heart: regular rate and rhythm Lungs: breath sounds clear with normal work of breathing. The CXR is continuing to show improved aeration on the left but has persistent scarring that will likely persist.  Abdomen: obese, soft, non tender Extremities: warm , well perfused Wounds: the left thoracotomy incision and chest tube site are healing well with no signs of infection  Lab Results: No results for input(s): "WBC", "HGB", "HCT", "PLT" in the last 72 hours.  BMET:  No results for input(s): "NA", "K", "CL", "CO2", "GLUCOSE", "BUN", "CREATININE", "CALCIUM" in the last 72 hours.   PT/INR: No results for input(s): "LABPROT", "INR" in the last 72 hours. ABG    Component Value Date/Time   PHART 7.386 10/10/2020 0458   HCO3 28.7 (H) 10/10/2020 0458   TCO2 30 10/10/2020 0458   O2SAT 99.0 10/10/2020 0458   CBG (last 3)  No results for input(s): "GLUCAP" in the last 72 hours.  Meds Scheduled Meds:  buprenorphine-naloxone  1 tablet Sublingual BID   enoxaparin (LOVENOX) injection  40 mg Subcutaneous Q24H   levofloxacin  750 mg  Oral Daily   linezolid  600 mg Oral Q12H   nicotine  21 mg Transdermal Daily   pantoprazole  40 mg Oral BID   saccharomyces boulardii  250 mg Oral BID   sodium chloride flush  3 mL Intravenous Q12H   Continuous Infusions:  sodium chloride     PRN Meds:.acetaminophen **OR** acetaminophen, albuterol, alum & mag hydroxide-simeth, bisacodyl, calcium carbonate, dicyclomine, guaiFENesin, hydrALAZINE, hydrOXYzine, loperamide, menthol-cetylpyridinium, methocarbamol, ondansetron **OR** ondansetron (ZOFRAN) IV, polyethylene glycol, prochlorperazine  Xrays DG CHEST PORT 1 VIEW  Result Date: 09/08/2022 CLINICAL DATA:  Empyema EXAM: PORTABLE CHEST 1 VIEW COMPARISON:  09/06/2022 FINDINGS: The heart size and mediastinal contours are within normal limits. Unchanged appearance of loculated fluid and bandlike scarring or atelectasis of the left lung. No new airspace opacity. Right lung is normally aerated. The visualized skeletal structures are unremarkable. IMPRESSION: Unchanged appearance of loculated fluid and bandlike scarring or atelectasis of the left lung. No new airspace opacity. Electronically Signed   By: Delanna Ahmadi M.D.   On: 09/08/2022 10:33   CT ABDOMEN PELVIS WO CONTRAST  Result Date: 09/06/2022 CLINICAL DATA:  Abdominal pain.  Recent surgery for empyema. EXAM: CT ABDOMEN AND PELVIS WITHOUT CONTRAST TECHNIQUE: Multidetector CT imaging of the abdomen and pelvis was performed following the standard protocol without IV contrast. RADIATION DOSE REDUCTION: This exam was performed according to the departmental dose-optimization program which includes automated exposure control, adjustment of the mA and/or kV according to patient size and/or  use of iterative reconstruction technique. COMPARISON:  08/10/2022. FINDINGS: Lower chest: Left-sided pleural effusion with pockets of extraluminal air consistent with empyema and air in the soft tissues of the left lower chest wall consistent with recent procedure.  Hepatobiliary: No focal liver abnormality is seen. No gallstones, gallbladder wall thickening, or biliary dilatation. Pancreas: Unremarkable. No pancreatic ductal dilatation or surrounding inflammatory changes. Spleen: Enlarged, 16 cm. Adrenals/Urinary Tract: Adrenal glands are unremarkable. Kidneys are normal, without renal calculi, focal lesion, or hydronephrosis. Bladder is unremarkable. Stomach/Bowel: Stomach is within normal limits. Appendix appears normal. No evidence of bowel wall thickening, distention, or inflammatory changes. Diffuse colonic diverticulosis. Vascular/Lymphatic: No significant vascular findings are present. No enlarged abdominal or pelvic lymph nodes. Reproductive: Prostate is unremarkable. Other: No abdominal wall hernia or abnormality. No abdominopelvic ascites. 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 IMPRESSION: 1. Left-sided and pain with postop changes. 2. Splenomegaly. 3. Diverticulosis. Electronically Signed   By: Sammie Bench M.D.   On: 09/06/2022 20:31     Recent Results (from the past 240 hour(s))  Blood Culture (routine x 2)     Status: None   Collection Time: 08/31/22 10:40 PM   Specimen: BLOOD  Result Value Ref Range Status   Specimen Description BLOOD LEFT ARM  Final   Special Requests   Final    BOTTLES DRAWN AEROBIC AND ANAEROBIC Blood Culture adequate volume   Culture   Final    NO GROWTH 5 DAYS Performed at [facility], [address], [place], [place] [address]    Report Status 09/05/2022 FINAL  Final  Resp panel by RT-PCR (RSV, Flu A&B, Covid) Anterior Nasal Swab     Status: None   Collection Time: 08/31/22 11:50 PM   Specimen: Anterior Nasal Swab  Result Value Ref Range Status   SARS Coronavirus 2 by RT PCR NEGATIVE NEGATIVE Final   Influenza A by PCR NEGATIVE NEGATIVE Final   Influenza B by PCR NEGATIVE NEGATIVE Final    Comment: (NOTE) The Xpert Xpress SARS-CoV-2/FLU/RSV  plus assay is intended as an aid in the diagnosis of influenza from Nasopharyngeal swab specimens and should not be used as a sole basis for treatment. Nasal washings and aspirates are unacceptable for Xpert Xpress SARS-CoV-2/FLU/RSV testing.  Fact Sheet for Patients: EntrepreneurPulse.com.au  Fact Sheet for Healthcare Providers: IncredibleEmployment.be  This test is not yet approved or cleared by the [place] FDA and has been authorized for detection and/or diagnosis of SARS-CoV-2 by FDA under an Emergency Use Authorization (EUA). This EUA will remain in effect (meaning this test can be used) for the duration of the COVID-19 declaration under Section 564(b)(1) of the Act, 21 U.S.C. section 360bbb-3(b)(1), unless the authorization is terminated or revoked.     Resp Syncytial Virus by PCR NEGATIVE NEGATIVE Final    Comment: (NOTE) Fact Sheet for Patients: EntrepreneurPulse.com.au  Fact Sheet for Healthcare Providers: IncredibleEmployment.be  This test is not yet approved or cleared by the [place] FDA and has been authorized for detection and/or diagnosis of SARS-CoV-2 by FDA under an Emergency Use Authorization (EUA). This EUA will remain in effect (meaning this test can be used) for the duration of the COVID-19 declaration under Section 564(b)(1) of the Act, 21 U.S.C. section 360bbb-3(b)(1), unless the authorization is terminated or revoked.  Performed at [facility], [place] [address] [place], [place] [address]   Blood Culture (routine x 2)     Status: None  Collection Time: 09/01/22 12:23 AM   Specimen: BLOOD RIGHT FOREARM  Result Value Ref Range Status   Specimen Description BLOOD RIGHT FOREARM  Final   Special Requests   Final    BOTTLES DRAWN AEROBIC AND ANAEROBIC Blood Culture adequate volume   Culture   Final    NO GROWTH 5 DAYS Performed at [facility], [address] [place], [place] [address]    Report Status 09/06/2022 FINAL  Final  Aerobic/Anaerobic Culture w Gram Stain (surgical/deep wound)     Status: None   Collection Time: 09/02/22  3:38 PM   Specimen: PATH Other; Body Fluid  Result Value Ref Range Status   Specimen Description FLUID PLEURAL  Final   Special Requests NONE  Final   Gram Stain NO WBC SEEN NO ORGANISMS SEEN   Final   Culture   Final    No growth aerobically or anaerobically. Performed at [facility], [place] [address], [place], [place] [address]    Report Status 09/07/2022 FINAL  Final  Acid Fast Smear (AFB)     Status: None   Collection Time: 09/02/22  3:38 PM   Specimen: PATH Other; Body Fluid  Result Value Ref Range Status   AFB Specimen Processing Concentration  Final   Acid Fast Smear Negative  Final    Comment: (NOTE) Performed At: [facility] [place], [place] [id number] Rush Farmer MD [id number]    Source (AFB) FLUID  Final    Comment: PLEURAL Performed at [facility], [place] [address], [place], [place] [address]   Surgical pcr screen     Status: Abnormal   Collection Time: 09/02/22  4:03 PM   Specimen: Nasal Mucosa; Nasal Swab  Result Value Ref Range Status   MRSA, PCR NEGATIVE NEGATIVE Final   Staphylococcus aureus POSITIVE (A) NEGATIVE Final    Comment: (NOTE) The Xpert SA Assay (FDA approved for NASAL specimens in patients 34 years of age and older), is one component of a comprehensive surveillance program. It is not intended to diagnose infection nor to guide or monitor treatment. Performed at [facility], [place] [address] [place], [place] [address]     Assessment/Plan: S/P Procedure(s) (LRB): VIDEO ASSISTED THORACOSCOPY (VATS)/DECORTICATION (Left)  -Post-op day 6 left thoracotomy, drainage of empyema and decortication.  OR cultures of pleural fluid remain negative to date. He was initially treated with IV Vanc and cefepime. ID on board and  recommending PO linezolid and levofloxacin to complete 14-day course.   OK to discharge from CT surgery standpoint. He has follow up scheduled with Dr. Kipp Brood on 4/5/ 24 with a CXR and also with Dr. Gale Journey at the end of the month.       LOS: 7 days    Antony Odea PA-C Pager [phone] 09/08/2022

## 2022-09-08 NOTE — TOC Transition Note (Signed)
Transition of Care (TOC) - CM/SW Discharge Note Marvetta Gibbons RN, BSN Transitions of Care Unit 4E- RN Case Manager See Treatment Team for direct phone #   Patient Details  Name: Derek Woods MRN: [id number] Date of Birth: 08-16-1990  [facility]) CM/SW Contact:  Dawayne Patricia, RN Phone Number: 09/08/2022, 2:57 PM   Clinical Narrative:    Pt stable for transition home today, per MD pt agreeable to Suboxone clinic and needs assistance to find one.  CM spoke with pt at bedside- confirmed pt will be in [place] and would like clinic nearby. Pt voiced that he has been to Suboxone clinic in past however it was during Pandemic and does not remember name as it was telehealth. Pt agreeable to CM calling clinics in [place] to find one for him to follow up. Pt voiced he would have transportation available.   Call made to [facility]- confirmed they do dose Suboxone in their clinic if patient meets criteria on initial assessment.  Pt will need to do walk-in assessment at their clinic for intake- hours are 8a-3:30p M-F, and also can be seen 24/7 at their [facility] clinic for assessment.  Info provided to pt and placed on AVS.   No further TOC needs noted. Pt voiced appreciation.    Final next level of care: Home/Self Care Barriers to Discharge: No Barriers Identified   Patient Goals and CMS Choice CMS Medicare.gov Compare Post Acute Care list provided to:: Patient Choice offered to / list presented to : NA  Discharge Placement                 Home        Discharge Plan and Services Additional resources added to the After Visit Summary for   In-house Referral: Clinical Social Work Discharge Planning Services: CM Consult, Other - See comment (Suboxone clinic) Post Acute Care Choice: NA          DME Arranged: N/A DME Agency: NA       HH Arranged: NA HH Agency: NA        Social Determinants of Health (SDOH) Interventions SDOH Screenings   Food  Insecurity: No Food Insecurity (09/01/2022)  Transportation Needs: No Transportation Needs (09/01/2022)  Tobacco Use: High Risk (09/03/2022)     Readmission Risk Interventions    09/08/2022    2:57 PM 10/30/2020    2:06 PM  Readmission Risk Prevention Plan  Transportation Screening Complete Complete  PCP or Specialist Appt within 5-7 Days Complete Complete  Home Care Screening Complete Complete  Medication Review (RN CM) Complete Complete

## 2022-09-09 NOTE — Discharge Summary (Addendum)
Physician Discharge Summary   Patient: Derek Woods MRN: [id number] DOB: 1991-02-16  Admit date:     08/31/2022  Discharge date: 09/08/22  Discharge Physician: Raiford Noble, DO   PCP: Pcp, No   Recommendations at discharge:   Follow-up and establish with PCP within 1 to 2 weeks and repeat CBC, CMP, mag, Phos within 1 week Follow-up with cardiothoracic surgery Dr. Kipp Brood on 09/19/2022 and repeat chest x-ray at that time Follow-up with infectious diseases Dr. Gale Journey on 10/14/2022 at 9:30 AM and continue antibiotics till completion Follow-up with the Suboxone clinic in [place] as he has been referred there and he will need to have a walk-in appointment.  As a courtesy he has been given 5 days of Suboxone as he makes the transition of the clinic  Discharge Diagnoses: Principal Problem:   Empyema of left pleural space ([place]) Active Problems:   Polysubstance dependence including opioid type drug with complication, episodic abuse ([name])   Morbid obesity with BMI of 50.0-59.9, adult ([name])   Tobacco dependence   Asthma, chronic   IVDU (intravenous drug user)  Resolved Problems:   * No resolved hospital problems. [name] Course: The patient is a 32 year old morbidly obese Caucasian male with past medical history significant for but limited to IV drug abuse as well as other comorbidities who presents with shortness of breath.  He was recently at [name] and they told him he had pneumonia for the past 5 months due to the loculated effusions and probable empyema.  He underwent a thoracentesis on 08/29/2022.  He left [name] he has medical advice given that he is feeling better and came back to [name] ED.  He was noted to be hypoxic 3 L chest x-ray again showed complete opacification of left lung and he was placed on broad-spectrum antibiotics and received multiple fluid boluses.  CT surgery was consulted and the patient for a VATS on 09/01/2020.  Currently he is postoperative day  3 and after repeat chest x-ray today his chest tube was removed.  The case was discussed with infectious diseases who was confirmed for staphylococcal infection so they recommend obtaining a TEE and obtaining medical records from [name].  ID is to see the patient in the morning as they feel that he may need prolonged IV antibiotics given that he is a drug user but currently he is refused to consent for TEE.  Currently refusing his antibiotics as well.   ID has now been consulted and they are recommending continuing p.o. antibiotics and changed him off the IV antibiotics given his refusal.  Will get a CT scan of his abdomen pelvis given his nausea and vomiting.  Been refusing quite a bit of interventions while he has been hospitalized including antibiotics, blood draws as well as IV fluids.   He subsequently improved and felt better and was agreeable to blood work and chest x-ray.  Cardiothoracic surgery felt that he could be safely discharged.  He was transition to oral antibiotics and a referral to the Suboxone clinic was made given that he is now willing to make lifestyle changes and is tired of living a life of drugs.  As a courtesy he is given 5 days of Suboxone as he makes the transition to Suboxone clinic and will follow-up open establish with a PCP as well as follow-up with the infectious disease doctor as well as the cardiothoracic surgeon as above.  He is medically stable for discharge at this time.  Assessment and Plan:  Empyema with Acute Respiratory Failure with hypoxia Left Pleural Effusion -S/p right-sided VATS on 10/09/2020 as well and is now s/p VATS on 09/02/2022 -Continue cefepime and vancomycin day #4 today. -Continue supportive care and further care per Cardiothoracic surgery -IVF Hydration now to stop SpO2: 96 % O2 Flow Rate (L/min): 3 L/min; Off of All O2 -Continuous pulse oximetry maintain O2 saturation greater 90% -Continue supplemental oxygen via nasal cannula wean O2 as  tolerated -Continue supportive care and nebs as below -Continue guaifenesin 600 mg p.o. twice daily as needed cough and will add incentive spirometer as well as flutter valve -Continue with empiric antibiotics with IV cefepime and vancomycin and case was discussed with ID who recommends obtaining records from [place] and likely long-term antibiotics given IV drug use; due to concern for staphylococcal infection recommended TEE which is going to be arranged for Tuesday however patient has refused to consent for it -Currently patient is refusing antibiotics due to abdominal discomfort and refused again today but is agreeable to try oral; -Chest tube has been removed -Chest x-ray done on 09/08/2022 and showed "Unchanged appearance of loculated fluid and bandlike scarring or atelectasis of the left lung. No new airspace opacity.." -Further care per cardiothoracic surgery and awaiting their clearance to try and a discharge the patient potentially  -Infectious diseases was consulted and they feel that his IV drug use has caused occult bacteremia leading to empyema with no other obvious metastatic focus Blood cultures have been negative -ID has now changed his antibiotics to p.o. Linezolid 600 mg po twice daily and Levofloxacin 750 mg p.o. daily to finish a 14-day course on 09/16/2022 -Patient to have ID follow-up in 3 to 4 weeks and has a follow-up on 10/14/2022 at 930   Noncompliance with Treatment -Patient is signed out [name] multiple times now refusing blood work, interventions IV fluids and refusing IV antibiotics was hospitalized this time but allowed [place] to get blood work and imaging on him today and cardiothoracic surgery felt that he could be safely discharged home    Heroin Dependence with ongoing IV use -Presently on clonidine detox and COWS protocol and is taking 0.1 mg of clonidine every morning and at bedtime for 4 doses and 0.1 g p.o. daily for 2 doses starting 22 March -Suboxone ordered and  will continue but after discussion with the patient today and he states that he does continue to do IV drugs will consider going to Suboxone clinic.   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 every 8 as needed for muscle spasms as well as p.o. acetaminophen 650 mg every 6 as needed for mild pain or fever -Have written him for Suboxone as he is willing to make lifestyle changes and wants to go to a Suboxone clinic.  Case manager has made a referral for him   Asthma -C/w As needed inhaled bronchodilators albuterol 2.5 mg neb every 2 as needed for wheezing   Tobacco Use and Abuse -Smoking cessation counseling to be provided -C/w Nicotine patch 21 mg TD   Heartburn, improved -Provided with PRN TUMS overnight -Have added PPI -Continues to have abdominal discomfort however agreed to have the CT scan but does not want any further intervention and IV is lost and so continues to  refuse medications and IV fluids   Nausea and Vomiting, improved -Obtain CT scan of the abdomen pelvis given his nausea vomiting and it showed " Stomach is within normal limits. Appendix appears normal. No evidence of bowel wall thickening, distention, or inflammatory changes. Diffuse colonic diverticulosis." -Continue supportive care -Wanted to start IV fluids at 100 MLS per hour but patient refused -Continue supportive care and antiemetics with p.o./IV ondansetron as well as IV Compazine as needed   Normocytic Anemia with mild acute blood loss anemia -Hgb/Hct Trend: Recent Labs  Lab 08/31/22 2230 09/02/22 0431 09/03/22 0252 09/04/22 1103 09/08/22 1247  HGB 12.7* 10.4* 10.6* 9.7* 12.0*  HCT 38.1* 31.5* 31.0* 29.4* 36.5*  MCV 93.4 92.9 91.2 92.5  91.7  -Check Anemia Panel but has been refusing labs As expected blood loss -Continue to Monitor for S/Sx of Bleeding; No overt bleeding noted -Patient has been refusing blood draws this to draw blood on him on the day of discharge and his labs look stable   Hypophosphatemia -Mild. Phos Level was 2.4 on last check -Replete with po K Phos 500 mg x1 -Continue to Monitor and Replete as Necessary -Refuses blood work last few days   Hypoalbuminemia -Patient's Albumin Trend: Recent Labs  Lab 08/31/22 2230 09/04/22 1103 09/08/22 1247  ALBUMIN 2.7* 2.0* 2.5*  -Continue to Monitor and Trend and repeat CMP in the AM   Morbid Obesity -Complicates overall prognosis and care -Estimated body mass index is 49.23 kg/m as calculated from the following:   Height as of this encounter: 5\' 6"  (1.676 m).   Weight as of this encounter: 138.3 kg.  -Weight Loss and Dietary Counseling given  Pain control - [place] Controlled Substance Reporting System database was reviewed. and patient was instructed, not to drive, operate heavy machinery, perform activities at heights, swimming or participation in water activities or provide baby-sitting services while on Pain, Sleep and Anxiety Medications; until their outpatient Physician has advised to do so again. Also recommended to not to take more than prescribed Pain, Sleep and Anxiety Medications.   Consultants: Cardiothoracic surgery, ID Procedures performed: As delineated as above Disposition: Home Diet recommendation:  Discharge Diet Orders (From admission, onward)     Start     Ordered   09/08/22 0000  Diet general        09/08/22 1416           Regular diet DISCHARGE MEDICATION: Allergies as of 09/08/2022       Reactions   Penicillins Rash   Childhood allergy Tolerated cefazolin and cefepime before        Medication List     TAKE these medications    acetaminophen 325 MG tablet Commonly known as: TYLENOL Take 2 tablets (650  mg total) by mouth every 6 (six) hours as needed for mild pain (or Fever >/= 101).   buprenorphine-naloxone 8-2 mg Subl SL tablet Commonly known as: SUBOXONE Place 1 tablet under the tongue 2 (two) times daily for 5 days.   levofloxacin 750 MG tablet Commonly known as: LEVAQUIN Take 1 tablet (750 mg total) by mouth daily for 7 days.   linezolid 600 MG tablet Commonly known as: ZYVOX Take 1 tablet (600 mg total) by mouth every 12 (twelve) hours for 7 days.   methocarbamol 500 MG tablet Commonly known as: ROBAXIN Take 1 tablet (500 mg total) by mouth every 8 (eight) hours as needed for muscle spasms.   Mucus Relief 600 MG 12 hr tablet Generic drug: guaiFENesin  Take 1 tablet (600 mg total) by mouth 2 (two) times daily for 5 days.   nicotine 21 mg/24hr patch Commonly known as: NICODERM CQ - dosed in mg/24 hours Place 1 patch (21 mg total) onto the skin daily.   ondansetron 4 MG tablet Commonly known as: ZOFRAN Take 1 tablet (4 mg total) by mouth every 6 (six) hours as needed for nausea.   pantoprazole 40 MG tablet Commonly known as: PROTONIX Take 1 tablet (40 mg total) by mouth daily.   polyethylene glycol powder 17 GM/SCOOP powder Commonly known as: GLYCOLAX/MIRALAX Take 1 capful (17 g) mixed in 8oz of water by mouth daily as needed for mild constipation.   saccharomyces boulardii 250 MG capsule Commonly known as: FLORASTOR Take 1 capsule (250 mg total) by mouth 2 (two) times daily for 10 days.   Ventolin HFA 108 (90 Base) MCG/ACT inhaler Generic drug: albuterol Inhale 2 puffs into the lungs every 6 (six) hours as needed for shortness of breath or wheezing.        Follow-up Information     Lajuana Matte, MD. Go on 09/19/2022.   Specialty: Cardiothoracic Surgery Why: Appointment time is at 12:40 pm Contact information: [place] [address] [phone]         [facility] Follow up.   Why: For Suboxone follow up- You  will need to go do a walk-in assessment- the outpt clinic is open M-F from 8am-3:30 for assessments or you can go to thier [facility]gent [facility] on ground floor- 24/7 for assessment. Contact information: [address] [place], [place] [address] [id number]               Discharge Exam: Danley Danker Weights   08/31/22 2221  Weight: (!) 138.3 kg   Vitals:   09/07/22 2040 09/08/22 1221  BP: 134/82 123/86  Pulse: 94 (!) 101  Resp: 18 18  Temp:  99.3 F (37.4 C)  SpO2: 95% 96%   Examination: Physical Exam:  Constitutional: WN/WD morbidly obese Caucasian male in no acute distress appears improved Respiratory: Diminished to auscultation bilaterally worse on the left compared to right, no wheezing, rales, rhonchi or crackles. Normal respiratory effort and patient is not tachypenic. No accessory muscle use.  Unlabored breathing Cardiovascular: RRR, no murmurs / rubs / gallops. S1 and S2 auscultated. No extremity edema.  Abdomen: Soft, non-tender, distended secondary to body habitus bowel sounds positive.  GU: Deferred. Musculoskeletal: No clubbing / cyanosis of digits/nails. No joint deformity upper and lower extremities.  Skin: No rashes, lesions, ulcers. No induration; Warm and dry.  Neurologic: CN 2-12 grossly intact with no focal deficits. Romberg sign and cerebellar reflexes not assessed.  Psychiatric: Normal judgment and insight. Alert and oriented x 3. Normal mood and appropriate affect.   Condition at discharge: stable  The results of significant diagnostics from this hospitalization (including imaging, microbiology, ancillary and laboratory) are listed below for reference.   Imaging Studies: DG CHEST PORT 1 VIEW  Result Date: 09/08/2022 CLINICAL DATA:  Empyema EXAM: PORTABLE CHEST 1 VIEW COMPARISON:  09/06/2022 FINDINGS: The heart size and mediastinal contours are within normal limits. Unchanged appearance of loculated fluid and bandlike scarring or atelectasis of the left lung.  No new airspace opacity. Right lung is normally aerated. The visualized skeletal structures are unremarkable. IMPRESSION: Unchanged appearance of loculated fluid and bandlike scarring or atelectasis of the left lung. No new airspace opacity. Electronically Signed   By: Jamse Mead.D.  On: 09/08/2022 10:33   CT ABDOMEN PELVIS WO CONTRAST  Result Date: 09/06/2022 CLINICAL DATA:  Abdominal pain.  Recent surgery for empyema. EXAM: CT ABDOMEN AND PELVIS WITHOUT CONTRAST TECHNIQUE: Multidetector CT imaging of the abdomen and pelvis was performed following the standard protocol without IV contrast. RADIATION DOSE REDUCTION: This exam was performed according to the departmental dose-optimization program which includes automated exposure control, adjustment of the mA and/or kV according to patient size and/or use of iterative reconstruction technique. COMPARISON:  08/10/2022. FINDINGS: Lower chest: Left-sided pleural effusion with pockets of extraluminal air consistent with empyema and air in the soft tissues of the left lower chest wall consistent with recent procedure. Hepatobiliary: No focal liver abnormality is seen. No gallstones, gallbladder wall thickening, or biliary dilatation. Pancreas: Unremarkable. No pancreatic ductal dilatation or surrounding inflammatory changes. Spleen: Enlarged, 16 cm. Adrenals/Urinary Tract: Adrenal glands are unremarkable. Kidneys are normal, without renal calculi, focal lesion, or hydronephrosis. Bladder is unremarkable. Stomach/Bowel: Stomach is within normal limits. Appendix appears normal. No evidence of bowel wall thickening, distention, or inflammatory changes. Diffuse colonic diverticulosis. Vascular/Lymphatic: No significant vascular findings are present. No enlarged abdominal or pelvic lymph nodes. Reproductive: Prostate is unremarkable. Other: No abdominal wall hernia or abnormality. No abdominopelvic ascites. Musculoskeletal: Hyperkyphosis T10-11 with compression  deformities, and this finding was seen in 2022. IMPRESSION: 1. Left-sided and pain with postop changes. 2. Splenomegaly. 3. Diverticulosis. Electronically Signed   By: Sammie Bench M.D.   On: 09/06/2022 20:31   DG Chest 1 View  Result Date: 09/06/2022 CLINICAL DATA:  Surgery empyema follow up EXAM: CHEST  1 VIEW COMPARISON:  09/05/2022 FINDINGS: Alveolar opacity left hemithorax consistent with mid to lower lung pneumonia. Small left-sided parapneumonic effusion. Right lung clear. Normal pulmonary vasculature. No pneumothorax. IMPRESSION: Left-sided alveolar consolidation consistent with pneumonia and small parapneumonic effusion. Electronically Signed   By: Sammie Bench M.D.   On: 09/06/2022 10:34   DG CHEST PORT 1 VIEW  Result Date: 09/05/2022 CLINICAL DATA:  Pleural effusion EXAM: PORTABLE CHEST 1 VIEW COMPARISON:  X-ray 09/04/2022 FINDINGS: Stable left chest tube. Small left-sided pleural effusion similar to previous. Adjacent interstitial parenchymal lung opacity. Right lung is grossly clear except for a tiny right effusion. No right-sided pneumothorax or consolidation. Stable cardiopericardial silhouette. Overlapping cardiac leads. Film is underinflated. IMPRESSION: No significant interval change when adjusting for technique Electronically Signed   By: Jill Side M.D.   On: 09/05/2022 12:29   DG CHEST PORT 1 VIEW  Result Date: 09/04/2022 CLINICAL DATA:  Left pleural effusion. EXAM: PORTABLE CHEST 1 VIEW COMPARISON:  September 03, 2022. FINDINGS: Stable cardiomediastinal silhouette. Stable position of left-sided chest tube. Stable small loculated left pleural effusion is noted laterally. Stable diffuse left lung opacity is noted concerning for possible pneumonia. Right lung is unremarkable. Bony thorax is unremarkable. IMPRESSION: Stable small loculated left pleural effusion and diffuse left lung opacity as noted on prior exam. Stable position of left-sided chest tube. Electronically Signed    By: Marijo Conception M.D.   On: 09/04/2022 08:24   DG CHEST PORT 1 VIEW  Result Date: 09/03/2022 CLINICAL DATA:  Empyema status post chest tube placement EXAM: PORTABLE CHEST 1 VIEW COMPARISON:  Chest radiograph dated 09/02/2022 FINDINGS: Lines/tubes: Left lateral pleural catheter in-situ. Chest: Persistent asymmetrically decreased left lung aeration. Similar linear right midlung opacity. Pleura: Similar small left pleural effusion.  No pneumothorax. Heart/mediastinum: Similar  cardiomediastinal silhouette. Bones: No acute osseous abnormality. IMPRESSION: Left lateral pleural catheter in-situ with persistent asymmetrically  decreased left lung aeration and small left pleural effusion. No pneumothorax. Electronically Signed   By: Darrin Nipper M.D.   On: 09/03/2022 08:22   DG CHEST PORT 1 VIEW  Result Date: 09/02/2022 CLINICAL DATA:  Empyema EXAM: PORTABLE CHEST 1 VIEW COMPARISON:  09/01/2022 FINDINGS: Previously seen left pleural pigtail catheter has been replaced with left chest tube. Decreasing left effusion with small residual effusion. No pneumothorax. Airspace disease throughout the left lung. Right upper lobe atelectasis. Heart and mediastinal contours within normal limits. No acute bony abnormality. IMPRESSION: Interval exchange of left pigtail catheter for left chest tube with decreasing left pleural effusion. Small left effusion remains. Diffuse left lung airspace disease. Electronically Signed   By: Rolm Baptise M.D.   On: 09/02/2022 19:08   ECHOCARDIOGRAM COMPLETE  Result Date: 09/02/2022    ECHOCARDIOGRAM REPORT   Patient Name:   Kaleil Hansell Date of Exam: 09/02/2022 Medical Rec #:  [id number]    Height:       66.0 in Accession #:    [id number]   Weight:       305.0 lb Date of Birth:  February 07, 1991    BSA:          2.393 m Patient Age:    2 years     BP:           125/85 mmHg Patient Gender: M            HR:           86 bpm. 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 Polysubstance abuse; Risk Factors:Current                 Smoker.  Sonographer:    Greer Pickerel Referring Phys: Wilder Glade GOLD  Sonographer Comments: Patient is obese, Technically difficult study due to poor echo windows and Technically challenging study due to limited acoustic windows. Image acquisition challenging due to uncooperative patient and Image acquisition challenging due to respiratory motion. IMPRESSIONS  1. Left ventricular ejection fraction, by estimation, is 50 to 55%. The left ventricle has low normal function. Left ventricular endocardial border not optimally defined to evaluate regional wall motion. Left ventricular diastolic parameters were normal.  2. Right ventricular systolic function was not well visualized. The right ventricular size is not well visualized. Tricuspid regurgitation signal is inadequate for assessing PA pressure.  3. The mitral valve was not well visualized. No evidence of mitral valve regurgitation.  4. The aortic valve is tricuspid. Aortic valve regurgitation is not visualized. No aortic stenosis is present.  5. The inferior vena cava is normal in size with greater than 50% respiratory variability, suggesting right atrial pressure of 3 mmHg.  6. Technically dificult study with very poor images. The valves were barely visualized. This study cannot rule out endocarditis. FINDINGS  Left Ventricle: Left ventricular ejection fraction, by estimation, is 50 to 55%. The left ventricle has low normal function. Left ventricular endocardial border not optimally defined to evaluate regional wall motion. The left ventricular internal cavity  size was normal in size. There is no left ventricular hypertrophy. Left ventricular diastolic parameters were normal. Right Ventricle: The right ventricular size is not well visualized. Right  vetricular wall thickness was not  well visualized. Right ventricular systolic function was not well visualized. Tricuspid regurgitation signal is inadequate for assessing PA pressure. Left Atrium: Left atrial size was not well visualized. Right Atrium: Right atrial size was not well visualized. Pericardium: There is no evidence of pericardial effusion. Mitral Valve: The mitral valve was not well visualized. No evidence of mitral valve regurgitation. Tricuspid Valve: The tricuspid valve is not well visualized. Tricuspid valve regurgitation is not demonstrated. Aortic Valve: The aortic valve is tricuspid. Aortic valve regurgitation is not visualized. No aortic stenosis is present. Pulmonic Valve: The pulmonic valve was normal in structure. Pulmonic valve regurgitation is not visualized. Aorta: The aortic root is normal in size and structure. Venous: The inferior vena cava is normal in size with greater than 50% respiratory variability, suggesting right atrial pressure of 3 mmHg. IAS/Shunts: The interatrial septum was not well visualized.  LEFT VENTRICLE PLAX 2D LVIDd:         5.10 cm   Diastology LVIDs:         3.50 cm   LV e' medial:    13.30 cm/s LV PW:         1.00 cm   LV E/e' medial:  8.5 LV IVS:        0.70 cm   LV e' lateral:   12.50 cm/s LVOT diam:     1.80 cm   LV E/e' lateral: 9.0 LV SV:         52 LV SV Index:   22 LVOT Area:     2.54 cm  LEFT ATRIUM         Index LA diam:    3.80 cm 1.59 cm/m  AORTIC VALVE LVOT Vmax:   109.00 cm/s LVOT Vmean:  69.900 cm/s LVOT VTI:    0.204 m  AORTA Ao Root diam: 3.30 cm Ao Asc diam:  2.80 cm MITRAL VALVE MV Area (PHT): 5.31 cm     SHUNTS MV Decel Time: 143 msec     Systemic VTI:  0.20 m MV E velocity: 113.00 cm/s  Systemic Diam: 1.80 cm MV A velocity: 68.90 cm/s MV E/A ratio:  1.64 Dalton McleanMD Electronically signed by Franki Monte Signature Date/Time: 09/02/2022/1:13:23 PM    Final    DG Chest Port 1 View  Result Date: 09/01/2022 CLINICAL DATA:  Status post  chest tube placement EXAM: PORTABLE CHEST 1 VIEW COMPARISON:  08/31/2022 FINDINGS: Cardiac shadow is stable. New pigtail catheter is noted on the left with reduction in pleural effusion although a large lateral component remains. Underlying atelectasis is noted in the left lung. Right lung is clear. IMPRESSION: Reduction in left-sided pleural effusion following pigtail catheter placement. No pneumothorax is noted. Electronically Signed   By: Inez Catalina M.D.   On: 09/01/2022 16:21   DG Chest Port 1 View  Result Date: 08/31/2022 CLINICAL DATA:  Possible sepsis EXAM: PORTABLE CHEST 1 VIEW COMPARISON:  10/14/2020, 08/29/2022 08/28/2022 FINDINGS: Near complete opacification of left thorax without significant change with minimal aerated lung at left apex. Right lung grossly clear. Partially obscured cardiomediastinal silhouette IMPRESSION: Near complete opacification of left thorax with small aerated lung at left apex without significant change. Electronically Signed   By: Donavan Foil M.D.   On: 08/31/2022 22:59    Microbiology: Results for orders placed or performed during the hospital encounter of 08/31/22  Blood Culture (routine x 2)     Status: None   Collection Time: 08/31/22 10:40 PM   Specimen: BLOOD  Result Value Ref  Range Status   Specimen Description BLOOD LEFT ARM  Final   Special Requests   Final    BOTTLES DRAWN AEROBIC AND ANAEROBIC Blood Culture adequate volume   Culture   Final    NO GROWTH 5 DAYS Performed at [facility], [address] [place], [place] [address]    Report Status 09/05/2022 FINAL  Final  Resp panel by RT-PCR (RSV, Flu A&B, Covid) Anterior Nasal Swab     Status: None   Collection Time: 08/31/22 11:50 PM   Specimen: Anterior Nasal Swab  Result Value Ref Range Status   SARS Coronavirus 2 by RT PCR NEGATIVE NEGATIVE Final   Influenza A by PCR NEGATIVE NEGATIVE Final   Influenza B by PCR NEGATIVE NEGATIVE Final    Comment: (NOTE) The Xpert Xpress  SARS-CoV-2/FLU/RSV plus assay is intended as an aid in the diagnosis of influenza from Nasopharyngeal swab specimens and should not be used as a sole basis for treatment. Nasal washings and aspirates are unacceptable for Xpert Xpress SARS-CoV-2/FLU/RSV testing.  Fact Sheet for Patients: EntrepreneurPulse.com.au  Fact Sheet for Healthcare Providers: IncredibleEmployment.be  This test is not yet approved or cleared by the [place] FDA and has been authorized for detection and/or diagnosis of SARS-CoV-2 by FDA under an Emergency Use Authorization (EUA). This EUA will remain in effect (meaning this test can be used) for the duration of the COVID-19 declaration under Section 564(b)(1) of the Act, 21 U.S.C. section 360bbb-3(b)(1), unless the authorization is terminated or revoked.     Resp Syncytial Virus by PCR NEGATIVE NEGATIVE Final    Comment: (NOTE) Fact Sheet for Patients: EntrepreneurPulse.com.au  Fact Sheet for Healthcare Providers: IncredibleEmployment.be  This test is not yet approved or cleared by the [place] FDA and has been authorized for detection and/or diagnosis of SARS-CoV-2 by FDA under an Emergency Use Authorization (EUA). This EUA will remain in effect (meaning this test can be used) for the duration of the COVID-19 declaration under Section 564(b)(1) of the Act, 21 U.S.C. section 360bbb-3(b)(1), unless the authorization is terminated or revoked.  Performed at [facility], [place] [address], [place], [place] [address]   Blood Culture (routine x 2)     Status: None   Collection Time: 09/01/22 12:23 AM   Specimen: BLOOD RIGHT FOREARM  Result Value Ref Range Status   Specimen Description BLOOD RIGHT FOREARM  Final   Special Requests   Final    BOTTLES DRAWN AEROBIC AND ANAEROBIC Blood Culture adequate volume   Culture   Final    NO GROWTH 5 DAYS Performed at [facility], [place] [address], [place], [place] [address]    Report Status 09/06/2022 FINAL  Final  Aerobic/Anaerobic Culture w Gram Stain (surgical/deep wound)     Status: None   Collection Time: 09/02/22  3:38 PM   Specimen: PATH Other; Body Fluid  Result Value Ref Range Status   Specimen Description FLUID PLEURAL  Final   Special Requests NONE  Final   Gram Stain NO WBC SEEN NO ORGANISMS SEEN   Final   Culture   Final    No growth aerobically or anaerobically. Performed at [facility], [place] [address] [place], [place] [address]    Report Status 09/07/2022 FINAL  Final  Acid Fast Smear (AFB)     Status: None   Collection Time: 09/02/22  3:38 PM   Specimen: PATH Other; Body Fluid  Result Value Ref Range Status  AFB Specimen Processing Concentration  Final   Acid Fast Smear Negative  Final    Comment: (NOTE) Performed At: [facility] [place], [place] [id number] Rush Farmer MD [id number]    Source (AFB) FLUID  Final    Comment: PLEURAL Performed at [facility], [place] [address], [place], [place] [address]   Surgical pcr screen     Status: Abnormal   Collection Time: 09/02/22  4:03 PM   Specimen: Nasal Mucosa; Nasal Swab  Result Value Ref Range Status   MRSA, PCR NEGATIVE NEGATIVE Final   Staphylococcus aureus POSITIVE (A) NEGATIVE Final    Comment: (NOTE) The Xpert SA Assay (FDA approved for NASAL specimens in patients 58 years of age and older), is one component of a comprehensive surveillance program. It is not intended to diagnose infection nor to guide or monitor treatment. Performed at [facility], [place] [address] [place], [place] [address]     Labs: CBC: Recent Labs  Lab 09/03/22 0252 09/04/22 1103 09/08/22 1247  WBC 7.0 4.6 6.6  NEUTROABS  --  2.8 4.3  HGB 10.6* 9.7* 12.0*  HCT 31.0* 29.4* 36.5*  MCV 91.2 92.5 91.7  PLT 293 268 [id number]   Basic Metabolic Panel: Recent Labs  Lab 09/03/22 0252 09/04/22 1103  09/08/22 1247  NA 136 139 135  K 4.5 3.9 4.5  CL 105 109 96*  CO2 25 23 26   GLUCOSE 146* 113* 135*  BUN 11 12 11   CREATININE 0.81 0.91 1.00  CALCIUM 8.3* 8.2* 8.7*  MG  --  1.9 1.8  PHOS  --  2.4* 3.5   Liver Function Tests: Recent Labs  Lab 09/04/22 1103 09/08/22 1247  AST 25 25  ALT 18 17  ALKPHOS 93 119  BILITOT 0.4 0.4  PROT 5.6* 6.9  ALBUMIN 2.0* 2.5*   CBG: No results for input(s): "GLUCAP" in the last 168 hours.  Discharge time spent: greater than 30 minutes.  Signed: Raiford Noble, DO [facility] 09/09/2022

## 2022-09-19 ENCOUNTER — Ambulatory Visit: Payer: Medicaid Other | Admitting: Thoracic Surgery (Cardiothoracic Vascular Surgery)

## 2022-09-22 ENCOUNTER — Ambulatory Visit: Payer: Medicaid Other

## 2022-09-22 NOTE — Progress Notes (Unsigned)
     [address]       [name] [address]             [phone]   HPI: This is a 32 year old male with a past medical history of IVDA, tobacco abuse,  and morbid obesity who presented to [facility] ED with worsening shortness of breath. He was found to have pneumonia and a left pleural effusion (possible empyema). Dr. Cliffton Asters placed a left chest tube then took him to the OR on 09/02/2022 for a left VATS, decortication,  and intercostal nerve block.  Current Outpatient Medications  Medication Sig Dispense Refill   acetaminophen (TYLENOL) 325 MG tablet Take 2 tablets (650 mg total) by mouth every 6 (six) hours as needed for mild pain (or Fever >/= 101). 20 tablet 0   albuterol (VENTOLIN HFA) 108 (90 Base) MCG/ACT inhaler Inhale 2 puffs into the lungs every 6 (six) hours as needed for shortness of breath or wheezing. 18 g 0   methocarbamol (ROBAXIN) 500 MG tablet Take 1 tablet (500 mg total) by mouth every 8 (eight) hours as needed for muscle spasms. 10 tablet 0   nicotine (NICODERM CQ - DOSED IN MG/24 HOURS) 21 mg/24hr patch Place 1 patch (21 mg total) onto the skin daily. 28 patch 0   ondansetron (ZOFRAN) 4 MG tablet Take 1 tablet (4 mg total) by mouth every 6 (six) hours as needed for nausea. 20 tablet 0   pantoprazole (PROTONIX) 40 MG tablet Take 1 tablet (40 mg total) by mouth daily. 30 tablet 0   polyethylene glycol powder (GLYCOLAX/MIRALAX) 17 GM/SCOOP powder Take 1 capful (17 g) mixed in 8oz of water by mouth daily as needed for mild constipation. 238 g 0  Vital Signs:   Physical Exam: CV Pulmonary Wounds  Diagnostic Tests: ***  Impression and Plan: ***     Ardelle Balls, PA-C [facility] [phone]

## 2022-09-25 ENCOUNTER — Ambulatory Visit: Payer: Medicaid Other

## 2022-09-30 ENCOUNTER — Encounter: Payer: Self-pay | Admitting: Thoracic Surgery (Cardiothoracic Vascular Surgery)

## 2022-09-30 ENCOUNTER — Ambulatory Visit: Payer: Medicaid Other

## 2022-09-30 NOTE — Progress Notes (Deleted)
      [address]       [name] [address]             [phone]       HPI: [name] is a 32 year old gentleman with past history of hypertension, polysubstance abuse including IV narcotic use, morbid obesity, obstructive sleep apnea, and hypertension.  He was referred to [place] on a recent hospital admission for assistance with managing an empyema of the left pleural space.  He underwent video-assisted thoracoscopy with drainage of the empyema and decortication by Dr. Cliffton Asters on 09/02/2022.  Aerobic and anaerobic cultures have remained negative.  Fungal culture is negative.  AFB smear was negative.  He was discharged from the hospital on oral levofloxacin and linezolid with instructions to follow-up with infectious disease later this month. He returns today for scheduled surgical follow-up with chest x-ray.   Current Outpatient Medications  Medication Sig Dispense Refill   acetaminophen (TYLENOL) 325 MG tablet Take 2 tablets (650 mg total) by mouth every 6 (six) hours as needed for mild pain (or Fever >/= 101). 20 tablet 0   albuterol (VENTOLIN HFA) 108 (90 Base) MCG/ACT inhaler Inhale 2 puffs into the lungs every 6 (six) hours as needed for shortness of breath or wheezing. 18 g 0   methocarbamol (ROBAXIN) 500 MG tablet Take 1 tablet (500 mg total) by mouth every 8 (eight) hours as needed for muscle spasms. 10 tablet 0   nicotine (NICODERM CQ - DOSED IN MG/24 HOURS) 21 mg/24hr patch Place 1 patch (21 mg total) onto the skin daily. 28 patch 0   ondansetron (ZOFRAN) 4 MG tablet Take 1 tablet (4 mg total) by mouth every 6 (six) hours as needed for nausea. 20 tablet 0   pantoprazole (PROTONIX) 40 MG tablet Take 1 tablet (40 mg total) by mouth daily. 30 tablet 0   polyethylene glycol powder (GLYCOLAX/MIRALAX) 17 GM/SCOOP powder Take 1 capful (17 g) mixed in 8oz of water by mouth daily as needed for mild constipation. 238 g 0   No current facility-administered medications  for this visit.    Physical Exam: ***  Diagnostic Tests: ***  Impression: ***  Plan: ***   Leary Roca, PA-C [facility] [phone]

## 2022-10-08 LAB — FUNGUS CULTURE WITH STAIN

## 2022-10-08 LAB — FUNGUS CULTURE RESULT

## 2022-10-08 LAB — FUNGAL ORGANISM REFLEX

## 2022-10-14 ENCOUNTER — Inpatient Hospital Stay: Payer: Medicaid Other | Admitting: Internal Medicine

## 2022-10-18 LAB — ACID FAST CULTURE WITH REFLEXED SENSITIVITIES (MYCOBACTERIA): Acid Fast Culture: NEGATIVE
